# Patient Record
Sex: Female | Born: 1975 | Race: White | Hispanic: No | Marital: Married | Smoking: Current every day smoker
Health system: Southern US, Community
[De-identification: ages and names within clinical notes are randomized; demographics above are authoritative.]

## PROBLEM LIST (undated history)

## (undated) DIAGNOSIS — F329 Major depressive disorder, single episode, unspecified: Secondary | ICD-10-CM

## (undated) DIAGNOSIS — K219 Gastro-esophageal reflux disease without esophagitis: Secondary | ICD-10-CM

## (undated) DIAGNOSIS — I10 Essential (primary) hypertension: Secondary | ICD-10-CM

## (undated) DIAGNOSIS — J45909 Unspecified asthma, uncomplicated: Secondary | ICD-10-CM

## (undated) DIAGNOSIS — E119 Type 2 diabetes mellitus without complications: Secondary | ICD-10-CM

## (undated) DIAGNOSIS — Z973 Presence of spectacles and contact lenses: Secondary | ICD-10-CM

## (undated) DIAGNOSIS — R569 Unspecified convulsions: Secondary | ICD-10-CM

## (undated) DIAGNOSIS — J449 Chronic obstructive pulmonary disease, unspecified: Secondary | ICD-10-CM

## (undated) DIAGNOSIS — K3184 Gastroparesis: Secondary | ICD-10-CM

## (undated) DIAGNOSIS — G4700 Insomnia, unspecified: Secondary | ICD-10-CM

## (undated) DIAGNOSIS — I82409 Acute embolism and thrombosis of unspecified deep veins of unspecified lower extremity: Secondary | ICD-10-CM

## (undated) DIAGNOSIS — F32A Depression, unspecified: Secondary | ICD-10-CM

## (undated) DIAGNOSIS — K8020 Calculus of gallbladder without cholecystitis without obstruction: Secondary | ICD-10-CM

## (undated) DIAGNOSIS — E11649 Type 2 diabetes mellitus with hypoglycemia without coma: Secondary | ICD-10-CM

## (undated) DIAGNOSIS — F4010 Social phobia, unspecified: Secondary | ICD-10-CM

## (undated) DIAGNOSIS — T148XXA Other injury of unspecified body region, initial encounter: Secondary | ICD-10-CM

## (undated) DIAGNOSIS — M7120 Synovial cyst of popliteal space [Baker], unspecified knee: Secondary | ICD-10-CM

## (undated) DIAGNOSIS — G4730 Sleep apnea, unspecified: Secondary | ICD-10-CM

## (undated) DIAGNOSIS — F419 Anxiety disorder, unspecified: Secondary | ICD-10-CM

## (undated) DIAGNOSIS — R45851 Suicidal ideations: Secondary | ICD-10-CM

## (undated) DIAGNOSIS — C801 Malignant (primary) neoplasm, unspecified: Secondary | ICD-10-CM

## (undated) HISTORY — DX: Major depressive disorder, single episode, unspecified: F32.9

## (undated) HISTORY — DX: Suicidal ideations: R45.851

## (undated) HISTORY — DX: Synovial cyst of popliteal space (Baker), unspecified knee: M71.20

## (undated) HISTORY — DX: Acute embolism and thrombosis of unspecified deep veins of unspecified lower extremity: I82.409

## (undated) HISTORY — PX: ABDOMINAL HYSTERECTOMY: SHX81

## (undated) HISTORY — DX: Depression, unspecified: F32.A

## (undated) HISTORY — PX: DILATION AND CURETTAGE OF UTERUS: SHX78

## (undated) HISTORY — PX: CORONARY ARTERY ANGIOPLASTY: PR CATH30428

## (undated) HISTORY — PX: PB COLONOSCOPY,DIAGNOSTIC: 45378

## (undated) HISTORY — PX: HX HERNIA REPAIR: SHX51

## (undated) HISTORY — DX: Essential (primary) hypertension: I10

## (undated) HISTORY — DX: Type 2 diabetes mellitus without complications: E11.9

---
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
## 1998-01-26 ENCOUNTER — Ambulatory Visit (INDEPENDENT_AMBULATORY_CARE_PROVIDER_SITE_OTHER): Payer: Self-pay

## 1998-02-13 ENCOUNTER — Ambulatory Visit (INDEPENDENT_AMBULATORY_CARE_PROVIDER_SITE_OTHER): Payer: Self-pay

## 1998-02-19 ENCOUNTER — Ambulatory Visit (INDEPENDENT_AMBULATORY_CARE_PROVIDER_SITE_OTHER): Payer: Self-pay | Admitting: GASTROENTEROLOGY

## 1998-02-23 ENCOUNTER — Ambulatory Visit (INDEPENDENT_AMBULATORY_CARE_PROVIDER_SITE_OTHER): Payer: Self-pay

## 1998-03-19 ENCOUNTER — Ambulatory Visit (INDEPENDENT_AMBULATORY_CARE_PROVIDER_SITE_OTHER): Payer: Self-pay

## 1998-03-25 ENCOUNTER — Ambulatory Visit (HOSPITAL_COMMUNITY): Payer: Self-pay | Admitting: GASTROENTEROLOGY

## 1998-04-03 ENCOUNTER — Ambulatory Visit (INDEPENDENT_AMBULATORY_CARE_PROVIDER_SITE_OTHER): Payer: Self-pay

## 1998-04-06 ENCOUNTER — Ambulatory Visit (INDEPENDENT_AMBULATORY_CARE_PROVIDER_SITE_OTHER): Payer: Self-pay

## 1998-05-07 ENCOUNTER — Ambulatory Visit (INDEPENDENT_AMBULATORY_CARE_PROVIDER_SITE_OTHER): Payer: Self-pay

## 2003-08-22 ENCOUNTER — Inpatient Hospital Stay (HOSPITAL_COMMUNITY): Payer: Self-pay | Admitting: Psychiatry

## 2003-08-22 DIAGNOSIS — E119 Type 2 diabetes mellitus without complications: Secondary | ICD-10-CM | POA: Insufficient documentation

## 2003-08-22 DIAGNOSIS — E1142 Type 2 diabetes mellitus with diabetic polyneuropathy: Secondary | ICD-10-CM | POA: Insufficient documentation

## 2003-09-30 ENCOUNTER — Ambulatory Visit (HOSPITAL_COMMUNITY): Payer: Self-pay | Admitting: Psychiatry

## 2004-09-22 ENCOUNTER — Ambulatory Visit (HOSPITAL_COMMUNITY): Payer: Self-pay

## 2004-11-28 ENCOUNTER — Emergency Department (HOSPITAL_COMMUNITY): Payer: Self-pay | Admitting: Emergency Medical Services

## 2005-08-15 ENCOUNTER — Emergency Department (HOSPITAL_COMMUNITY): Payer: Self-pay | Admitting: Emergency Medicine

## 2005-09-30 ENCOUNTER — Ambulatory Visit (INDEPENDENT_AMBULATORY_CARE_PROVIDER_SITE_OTHER): Payer: Self-pay

## 2006-04-20 ENCOUNTER — Emergency Department (EMERGENCY_DEPARTMENT_HOSPITAL)
Admission: EM | Admit: 2006-04-20 | Discharge: 2006-04-20 | Disposition: A | Discharge status: Home / Self Care | Payer: MEDICAID

## 2006-04-22 ENCOUNTER — Emergency Department (EMERGENCY_DEPARTMENT_HOSPITAL): Admission: EM | Admit: 2006-04-22 | Discharge: 2006-04-22 | Payer: MEDICAID

## 2006-06-26 ENCOUNTER — Ambulatory Visit (INDEPENDENT_AMBULATORY_CARE_PROVIDER_SITE_OTHER): Payer: Self-pay

## 2006-06-28 ENCOUNTER — Emergency Department (EMERGENCY_DEPARTMENT_HOSPITAL)
Admission: EM | Admit: 2006-06-28 | Discharge: 2006-06-28 | Disposition: A | Discharge status: Home / Self Care | Payer: MEDICAID

## 2006-08-15 ENCOUNTER — Emergency Department (EMERGENCY_DEPARTMENT_HOSPITAL): Admission: EM | Admit: 2006-08-15 | Discharge: 2006-08-15 | Payer: MEDICAID

## 2006-09-04 ENCOUNTER — Emergency Department (EMERGENCY_DEPARTMENT_HOSPITAL): Admission: EM | Admit: 2006-09-04 | Discharge: 2006-09-04 | Payer: MEDICAID

## 2010-01-24 HISTORY — PX: HX HYSTERECTOMY: SHX81

## 2011-08-21 ENCOUNTER — Encounter (HOSPITAL_COMMUNITY): Payer: Self-pay

## 2011-08-21 ENCOUNTER — Emergency Department (HOSPITAL_COMMUNITY)
Admission: EM | Admit: 2011-08-21 | Discharge: 2011-08-22 | Disposition: A | Discharge status: Home / Self Care | Payer: Medicaid Other | Attending: Emergency Medicine | Admitting: Emergency Medicine

## 2011-08-21 DIAGNOSIS — J4489 Other specified chronic obstructive pulmonary disease: Secondary | ICD-10-CM | POA: Insufficient documentation

## 2011-08-21 DIAGNOSIS — J449 Chronic obstructive pulmonary disease, unspecified: Secondary | ICD-10-CM | POA: Insufficient documentation

## 2011-08-21 DIAGNOSIS — E119 Type 2 diabetes mellitus without complications: Secondary | ICD-10-CM | POA: Insufficient documentation

## 2011-08-21 DIAGNOSIS — F17200 Nicotine dependence, unspecified, uncomplicated: Secondary | ICD-10-CM | POA: Insufficient documentation

## 2011-08-21 DIAGNOSIS — R600 Localized edema: Secondary | ICD-10-CM

## 2011-08-21 DIAGNOSIS — R609 Edema, unspecified: Secondary | ICD-10-CM | POA: Insufficient documentation

## 2011-08-21 HISTORY — DX: Type 2 diabetes mellitus with hypoglycemia without coma: E11.649

## 2011-08-21 HISTORY — DX: Other injury of unspecified body region, initial encounter: T14.8XXA

## 2011-08-21 HISTORY — DX: Sleep apnea, unspecified: G47.30

## 2011-08-21 HISTORY — DX: Social phobia, unspecified: F40.10

## 2011-08-21 HISTORY — DX: Calculus of gallbladder without cholecystitis without obstruction: K80.20

## 2011-08-21 HISTORY — DX: Chronic obstructive pulmonary disease, unspecified: J44.9

## 2011-08-21 HISTORY — DX: Insomnia, unspecified: G47.00

## 2011-08-21 HISTORY — DX: Unspecified asthma, uncomplicated: J45.909

## 2011-08-21 MED ORDER — FUROSEMIDE 40 MG PO TABS
20.0000 mg | ORAL_TABLET | Freq: Once | ORAL | Status: AC
  Administered 2011-08-22: 20 mg via ORAL
  Filled 2011-08-21: qty 1

## 2011-08-21 NOTE — ED Notes (Signed)
Pt reports lower est swelling x1 day. Hurts to walk. Some edema noted, good cap refill.

## 2011-08-21 NOTE — ED Notes (Signed)
Feet and legs are swelling, having pain up into my knees per pt.

## 2011-08-22 MED ORDER — FUROSEMIDE 20 MG PO TABS: 20.0000 mg | ORAL_TABLET | Freq: Every day | ORAL | Status: DC

## 2011-08-22 MED ORDER — POTASSIUM CHLORIDE CRYS ER 20 MEQ PO TBCR: 20.0000 meq | EXTENDED_RELEASE_TABLET | Freq: Every day | ORAL | Status: DC

## 2011-08-22 NOTE — ED Provider Notes (Cosign Needed)
History     CSN: [id number]  Arrival date & time 08/21/11  2320   First MD Initiated Contact with Patient 08/21/11 2339      Chief Complaint  Patient presents with  . Foot Swelling    (Consider location/radiation/quality/duration/timing/severity/associated sxs/prior treatment) HPI  Judith Rice is a 36 y.o. female who presents to the Emergency Department complaining of swelling to both feet and lower legs that began today. Uncomfortable due to swelling. NO changes in diet or medicines. In the past she has been on Lasix 20 mg. Denies fever, chills, shortness of breath, chest pain.   No PCP Past Medical History  Diagnosis Date  . Nerve damage   . Sleep apnea   . COPD (chronic obstructive pulmonary disease)   . Asthma   . Hypoglycemia associated with diabetes   . Insomnia   . Gallstones   . Social anxiety disorder     Past Surgical History  Procedure Date  . Abdominal hysterectomy   . Dilation and curettage of uterus     History reviewed. No pertinent family history.  History  Substance Use Topics  . Smoking status: Current Everyday Smoker -- 1.0 packs/day  . Smokeless tobacco: Not on file  . Alcohol Use: No    OB History    Grav Para Term Preterm Abortions TAB SAB Ect Mult Living                  Review of Systems  Musculoskeletal:       Swelling to both feet and lower legs    Allergies  Clindamycin/lincomycin and Slo-bid gyrocaps  Home Medications   Current Outpatient Rx  Name Route Sig Dispense Refill  . ALBUTEROL SULFATE HFA 108 (90 BASE) MCG/ACT IN AERS Inhalation Inhale 2 puffs into the lungs every 6 (six) hours as needed.    . ALPRAZOLAM 0.5 MG PO TABS Oral Take 0.5 mg by mouth 3 (three) times daily.    . ARIPIPRAZOLE 20 MG PO TABS Oral Take 20 mg by mouth 2 (two) times daily.    . ASPIRIN 81 MG PO TABS Oral Take 81 mg by mouth daily.    Marland Kitchen CITALOPRAM HYDROBROMIDE 20 MG PO TABS Oral Take 20 mg by mouth at bedtime.    Marland Kitchen CLONAZEPAM 0.5 MG PO TABS  Oral Take 0.5 mg by mouth 2 (two) times daily as needed.    . CYCLOBENZAPRINE HCL 10 MG PO TABS Oral Take 10 mg by mouth daily.    Marland Kitchen FLUTICASONE PROPIONATE 50 MCG/ACT NA SUSP Nasal Place 1 spray into the nose daily.    Marland Kitchen FLUTICASONE-SALMETEROL 250-50 MCG/DOSE IN AEPB Inhalation Inhale 1 puff into the lungs every 12 (twelve) hours.    Marland Kitchen GABAPENTIN 300 MG PO CAPS Oral Take 300 mg by mouth 3 (three) times daily.    Marland Kitchen METFORMIN HCL 500 MG PO TABS Oral Take 500 mg by mouth daily with breakfast.    . MODAFINIL 200 MG PO TABS Oral Take 200 mg by mouth 2 (two) times daily.    Marland Kitchen MONTELUKAST SODIUM 10 MG PO TABS Oral Take 10 mg by mouth at bedtime.    . OMEPRAZOLE 20 MG PO CPDR Oral Take 20 mg by mouth daily.    Marland Kitchen ZOLPIDEM TARTRATE 10 MG PO TABS Oral Take 10 mg by mouth at bedtime as needed.      BP 107/59  Pulse 75  Temp 98.5 F (36.9 C) (Oral)  Resp 20  Ht 5\' 5"  (1.651 m)  Wt 300 lb (136.079 kg)  BMI 49.92 kg/m2  SpO2 96%  Physical Exam  Nursing note and vitals reviewed. Constitutional: She is oriented to person, place, and time.       Awake, alert, nontoxic appearance.  HENT:  Head: Atraumatic.  Eyes: Right eye exhibits no discharge. Left eye exhibits no discharge.  Neck: Neck supple.  Pulmonary/Chest: Effort normal. She has wheezes. She exhibits no tenderness.       Occasional end expiratory wheeze  Abdominal: Soft. There is no tenderness. There is no rebound.  Musculoskeletal: She exhibits edema. She exhibits no tenderness.       Baseline ROM, no obvious new focal weakness. 2+ non pitting edema to top of calf bilaterally  Neurological: She is alert and oriented to person, place, and time. She has normal reflexes.       Mental status and motor strength appears baseline for patient and situation.  Skin: Skin is warm and dry. No rash noted.  Psychiatric: She has a normal mood and affect.    ED Course  Procedures (including critical care time)      MDM  Mobidly obese woman  here with increased swelling to feet and lower legs. Will begin Lasix for one week. Firs dose given while here. Pt stable in ED with no significant deterioration in condition.The patient appears reasonably screened and/or stabilized for discharge and I doubt any other medical condition or other [facility] requiring further screening, evaluation, or treatment in the ED at this time prior to discharge.  MDM Reviewed: nursing note and vitals           Nicoletta Dress. Colon Branch, MD 08/22/11 7829

## 2011-08-22 NOTE — ED Notes (Signed)
Pt alert & oriented x4, stable gait. Patient given discharge instructions, paperwork & prescription(s). Patient  instructed to stop at the registration desk to finish any additional paperwork. Patient verbalized understanding. Pt left department w/ no further questions. 

## 2011-09-08 ENCOUNTER — Emergency Department (HOSPITAL_COMMUNITY)
Admission: EM | Admit: 2011-09-08 | Discharge: 2011-09-09 | Disposition: A | Discharge status: Home / Self Care | Payer: Medicaid Other | Attending: Emergency Medicine | Admitting: Emergency Medicine

## 2011-09-08 ENCOUNTER — Encounter (HOSPITAL_COMMUNITY): Payer: Self-pay | Admitting: *Deleted

## 2011-09-08 DIAGNOSIS — J449 Chronic obstructive pulmonary disease, unspecified: Secondary | ICD-10-CM | POA: Insufficient documentation

## 2011-09-08 DIAGNOSIS — Z91038 Other insect allergy status: Secondary | ICD-10-CM | POA: Insufficient documentation

## 2011-09-08 DIAGNOSIS — K8020 Calculus of gallbladder without cholecystitis without obstruction: Secondary | ICD-10-CM | POA: Insufficient documentation

## 2011-09-08 DIAGNOSIS — R109 Unspecified abdominal pain: Secondary | ICD-10-CM | POA: Insufficient documentation

## 2011-09-08 DIAGNOSIS — G4730 Sleep apnea, unspecified: Secondary | ICD-10-CM | POA: Insufficient documentation

## 2011-09-08 DIAGNOSIS — E119 Type 2 diabetes mellitus without complications: Secondary | ICD-10-CM | POA: Insufficient documentation

## 2011-09-08 DIAGNOSIS — Z881 Allergy status to other antibiotic agents status: Secondary | ICD-10-CM | POA: Insufficient documentation

## 2011-09-08 DIAGNOSIS — F411 Generalized anxiety disorder: Secondary | ICD-10-CM | POA: Insufficient documentation

## 2011-09-08 DIAGNOSIS — F17200 Nicotine dependence, unspecified, uncomplicated: Secondary | ICD-10-CM | POA: Insufficient documentation

## 2011-09-08 DIAGNOSIS — J4489 Other specified chronic obstructive pulmonary disease: Secondary | ICD-10-CM | POA: Insufficient documentation

## 2011-09-08 DIAGNOSIS — Z888 Allergy status to other drugs, medicaments and biological substances status: Secondary | ICD-10-CM | POA: Insufficient documentation

## 2011-09-08 LAB — URINALYSIS, ROUTINE W REFLEX MICROSCOPIC
Leukocytes, UA: NEGATIVE
Nitrite: POSITIVE — AB
Protein, ur: NEGATIVE mg/dL
Specific Gravity, Urine: 1.025 (ref 1.005–1.030)
Urobilinogen, UA: 0.2 mg/dL (ref 0.0–1.0)

## 2011-09-08 LAB — URINE MICROSCOPIC-ADD ON

## 2011-09-08 MED ORDER — HYDROMORPHONE HCL PF 1 MG/ML IJ SOLN
1.0000 mg | Freq: Once | INTRAMUSCULAR | Status: AC
  Administered 2011-09-08: 1 mg via INTRAVENOUS
  Filled 2011-09-08: qty 1

## 2011-09-08 MED ORDER — SODIUM CHLORIDE 0.9 % IV SOLN
Freq: Once | INTRAVENOUS | Status: AC
  Administered 2011-09-08: via INTRAVENOUS

## 2011-09-08 MED ORDER — PANTOPRAZOLE SODIUM 40 MG IV SOLR
40.0000 mg | Freq: Once | INTRAVENOUS | Status: AC
  Administered 2011-09-08: 40 mg via INTRAVENOUS
  Filled 2011-09-08: qty 40

## 2011-09-08 MED ORDER — ONDANSETRON HCL 4 MG/2ML IJ SOLN
4.0000 mg | Freq: Once | INTRAMUSCULAR | Status: AC
  Administered 2011-09-08: 4 mg via INTRAVENOUS
  Filled 2011-09-08: qty 2

## 2011-09-08 NOTE — ED Notes (Signed)
RUQ pain, N/V

## 2011-09-08 NOTE — ED Notes (Signed)
Gave patient drink at patient request and MD approval.

## 2011-09-08 NOTE — ED Provider Notes (Signed)
History  This chart was scribed for [employer]. Colon Branch, MD by Bennett Scrape. This patient was seen in room APA12/APA12 and the patient's care was started at 10:47PM.  CSN: [id number]  Arrival date & time 09/08/11  1939   First MD Initiated Contact with Patient 09/08/11 2247      Chief Complaint  Patient presents with  . Abdominal Pain    The history is provided by the patient. No language interpreter was used.    Judith Rice is a 36 y.o. female with a h/o gallstones who presents to the Emergency Department complaining of approximately 13 hours of gradual onset, gradually worsening, constant non-radiating RUQ abdominal pain with associated nausea and non-bloody emesis that she attributes to gallstones. She reports prior episodes of same symptoms attributed to gallstones, the last episode occuring one month ago. She states that she had a CT scan that showed gallstones during one of her last ED visits. She denies having pain medications or antiemetics at home. She reports that she has an [facility] on [facility]. She denies fever, neck pain, sore throat, visual disturbance, CP, cough, SOB, diarrhea, urinary symptoms, back pain, HA, weakness, numbness and rash as associated symptoms. She has a h/o asthma, COPD, anxiety and DM but she denies being on insulin. She is a current everyday smoker but denies alcohol use.  Past Medical History  Diagnosis Date  . Nerve damage   . Sleep apnea   . COPD (chronic obstructive pulmonary disease)   . Asthma   . Hypoglycemia associated with diabetes   . Insomnia   . Gallstones   . Social anxiety disorder     Past Surgical History  Procedure Date  . Abdominal hysterectomy   . Dilation and curettage of uterus     History reviewed. No pertinent family history.  History  Substance Use Topics  . Smoking status: Current Everyday Smoker -- 1.0 packs/day  . Smokeless tobacco: Not on file  . Alcohol Use: No    No OB history  provided.  Review of Systems  Constitutional: Negative for fever.       10 Systems reviewed and are negative for acute change except as noted in the HPI.  HENT: Negative for congestion.   Eyes: Negative for discharge and redness.  Respiratory: Negative for cough and shortness of breath.   Cardiovascular: Negative for chest pain.  Gastrointestinal: Negative for vomiting and abdominal pain.  Musculoskeletal: Negative for back pain.  Skin: Negative for rash.  Neurological: Negative for syncope, numbness and headaches.  Psychiatric/Behavioral:       No behavior change.    A complete 10 system review of systems was obtained and all systems are negative except as noted in the HPI and PMH.   Allergies  Bee venom; Clindamycin/lincomycin; and Slo-bid gyrocaps  Home Medications   Current Outpatient Rx  Name Route Sig Dispense Refill  . ALBUTEROL SULFATE HFA 108 (90 BASE) MCG/ACT IN AERS Inhalation Inhale 2 puffs into the lungs every 6 (six) hours as needed.    . ALPRAZOLAM 0.5 MG PO TABS Oral Take 0.5 mg by mouth 3 (three) times daily.    . ARIPIPRAZOLE 20 MG PO TABS Oral Take 20 mg by mouth 2 (two) times daily.    . ASPIRIN 81 MG PO TABS Oral Take 81 mg by mouth daily.    Marland Kitchen CITALOPRAM HYDROBROMIDE 20 MG PO TABS Oral Take 20 mg by mouth at bedtime.    Marland Kitchen CLONAZEPAM 0.5 MG PO  TABS Oral Take 0.5 mg by mouth 2 (two) times daily as needed.    . CYCLOBENZAPRINE HCL 10 MG PO TABS Oral Take 10 mg by mouth daily.    Marland Kitchen FLUTICASONE PROPIONATE 50 MCG/ACT NA SUSP Nasal Place 1 spray into the nose at bedtime.     Marland Kitchen FLUTICASONE-SALMETEROL 250-50 MCG/DOSE IN AEPB Inhalation Inhale 1 puff into the lungs every 12 (twelve) hours.    Marland Kitchen GABAPENTIN 300 MG PO CAPS Oral Take 300 mg by mouth 3 (three) times daily.    . IPRATROPIUM-ALBUTEROL 0.5-2.5 (3) MG/3ML IN SOLN Nebulization Take 3 mLs by nebulization every 6 (six) hours as needed.    Marland Kitchen METFORMIN HCL 500 MG PO TABS Oral Take 500 mg by mouth 2 (two) times  daily.     Marland Kitchen MODAFINIL 200 MG PO TABS Oral Take 200 mg by mouth 2 (two) times daily.    Marland Kitchen MONTELUKAST SODIUM 10 MG PO TABS Oral Take 10 mg by mouth at bedtime.    . OMEPRAZOLE 20 MG PO CPDR Oral Take 20 mg by mouth daily.    Marland Kitchen ZOLPIDEM TARTRATE 10 MG PO TABS Oral Take 10 mg by mouth at bedtime as needed.      Triage Vitals: BP 118/65  Pulse 76  Resp 20  Ht 5\' 5"  (1.651 m)  Wt 330 lb 4.8 oz (149.823 kg)  BMI 54.96 kg/m2  SpO2 95%  Physical Exam  Nursing note and vitals reviewed. Constitutional: She is oriented to person, place, and time. She appears well-developed and well-nourished. No distress.  HENT:  Head: Normocephalic and atraumatic.  Eyes: Conjunctivae and EOM are normal.  Neck: Neck supple. No tracheal deviation present.  Cardiovascular: Normal rate and regular rhythm.  Exam reveals no gallop and no friction rub.   No murmur heard. Pulmonary/Chest: Effort normal and breath sounds normal. No respiratory distress. She has no wheezes. She has no rales.  Abdominal: There is tenderness (mild RUQ tenderness). There is guarding (mild).  Musculoskeletal: Normal range of motion. She exhibits edema.  Neurological: She is alert and oriented to person, place, and time.  Skin: Skin is warm and dry.  Psychiatric: She has a normal mood and affect. Her behavior is normal.    ED Course  Procedures (including critical care time)  DIAGNOSTIC STUDIES: Oxygen Saturation is 95% on room air, normal by my interpretation.    COORDINATION OF CARE: 10:54PM-Discussed treatment plan which includes a referral to surgeon with pt at bedside and pt agreed to plan. Results for orders placed during the hospital encounter of 09/08/11  URINALYSIS, ROUTINE W REFLEX MICROSCOPIC      Component Value Range   Color, Urine AMBER (*) YELLOW   APPearance CLEAR  CLEAR   Specific Gravity, Urine 1.025  1.005 - 1.030   pH 6.0  5.0 - 8.0   Glucose, UA NEGATIVE  NEGATIVE mg/dL   Hgb urine dipstick NEGATIVE   NEGATIVE   Bilirubin Urine NEGATIVE  NEGATIVE   Ketones, ur NEGATIVE  NEGATIVE mg/dL   Protein, ur NEGATIVE  NEGATIVE mg/dL   Urobilinogen, UA 0.2  0.0 - 1.0 mg/dL   Nitrite POSITIVE (*) NEGATIVE   Leukocytes, UA NEGATIVE  NEGATIVE  URINE MICROSCOPIC-ADD ON      Component Value Range   Squamous Epithelial / LPF MANY (*) RARE   WBC, UA 0-2  <3 WBC/hpf   RBC / HPF 0-2  <3 RBC/hpf   Bacteria, UA MANY (*) RARE  MDM  Patient with known gallstones here with RUQ pains similar to previous gall bladder pain.Given IVF, anageslic, antiemetic with relief. Pt feels improved after observation and/or treatment in ED.Pt stable in ED with no significant deterioration in condition.The patient appears reasonably screened and/or stabilized for discharge and I doubt any other medical condition or other [facility] requiring further screening, evaluation, or treatment in the ED at this time prior to discharge.  I personally performed the services described in this documentation, which was scribed in my presence. The recorded information has been reviewed and considered.   MDM Reviewed: nursing note, previous chart and vitals Interpretation: labs           Nicoletta Dress. Colon Branch, MD 09/09/11 [phone]

## 2011-09-09 MED ORDER — HYDROCODONE-ACETAMINOPHEN 5-325 MG PO TABS: 1.0000 | ORAL_TABLET | ORAL | Status: AC | PRN

## 2011-09-09 MED ORDER — ONDANSETRON HCL 4 MG PO TABS: 4.0000 mg | ORAL_TABLET | Freq: Four times a day (QID) | ORAL | Status: DC

## 2011-09-12 ENCOUNTER — Observation Stay (HOSPITAL_COMMUNITY)
Admission: EM | Admit: 2011-09-12 | Discharge: 2011-09-12 | Disposition: A | Discharge status: Home / Self Care | Payer: Medicaid Other | Attending: Emergency Medicine | Admitting: Emergency Medicine

## 2011-09-12 ENCOUNTER — Encounter (HOSPITAL_COMMUNITY): Payer: Self-pay | Admitting: *Deleted

## 2011-09-12 ENCOUNTER — Observation Stay (HOSPITAL_COMMUNITY): Payer: Medicaid Other

## 2011-09-12 DIAGNOSIS — R112 Nausea with vomiting, unspecified: Secondary | ICD-10-CM | POA: Insufficient documentation

## 2011-09-12 DIAGNOSIS — R1011 Right upper quadrant pain: Principal | ICD-10-CM

## 2011-09-12 DIAGNOSIS — J4489 Other specified chronic obstructive pulmonary disease: Secondary | ICD-10-CM | POA: Insufficient documentation

## 2011-09-12 DIAGNOSIS — J449 Chronic obstructive pulmonary disease, unspecified: Secondary | ICD-10-CM | POA: Insufficient documentation

## 2011-09-12 DIAGNOSIS — E119 Type 2 diabetes mellitus without complications: Secondary | ICD-10-CM | POA: Insufficient documentation

## 2011-09-12 LAB — RAPID URINE DRUG SCREEN, HOSP PERFORMED: Opiates: NOT DETECTED

## 2011-09-12 LAB — COMPREHENSIVE METABOLIC PANEL
ALT: 8 U/L (ref 0–35)
Alkaline Phosphatase: 81 U/L (ref 39–117)
BUN: 14 mg/dL (ref 6–23)
CO2: 24 mEq/L (ref 19–32)
Calcium: 9.6 mg/dL (ref 8.4–10.5)
GFR calc Af Amer: 88 mL/min — ABNORMAL LOW (ref >90)
GFR calc non Af Amer: 76 mL/min — ABNORMAL LOW (ref >90)
Glucose, Bld: 97 mg/dL (ref 70–99)
Potassium: 3.9 mEq/L (ref 3.5–5.1)
Sodium: 136 mEq/L (ref 135–145)
Total Protein: 7.7 g/dL (ref 6.0–8.3)

## 2011-09-12 LAB — URINALYSIS, ROUTINE W REFLEX MICROSCOPIC
Glucose, UA: NEGATIVE mg/dL
Hgb urine dipstick: NEGATIVE
Ketones, ur: NEGATIVE mg/dL
Leukocytes, UA: NEGATIVE
Protein, ur: NEGATIVE mg/dL
pH: 5.5 (ref 5.0–8.0)

## 2011-09-12 LAB — CBC
HCT: 46.3 % — ABNORMAL HIGH (ref 36.0–46.0)
Hemoglobin: 15.6 g/dL — ABNORMAL HIGH (ref 12.0–15.0)
MCH: 31.4 pg (ref 26.0–34.0)
MCHC: 33.7 g/dL (ref 30.0–36.0)
RBC: 4.97 MIL/uL (ref 3.87–5.11)

## 2011-09-12 LAB — LIPASE, BLOOD: Lipase: 21 U/L (ref 11–59)

## 2011-09-12 MED ORDER — ONDANSETRON HCL 4 MG/2ML IJ SOLN
4.0000 mg | Freq: Once | INTRAMUSCULAR | Status: AC
  Administered 2011-09-12: 4 mg via INTRAVENOUS
  Filled 2011-09-12: qty 2

## 2011-09-12 MED ORDER — SODIUM CHLORIDE 0.9 % IV SOLN
INTRAVENOUS | Status: DC
  Administered 2011-09-12: 05:00:00 via INTRAVENOUS

## 2011-09-12 MED ORDER — HYDROMORPHONE HCL PF 1 MG/ML IJ SOLN
1.0000 mg | Freq: Once | INTRAMUSCULAR | Status: AC
  Administered 2011-09-12: 1 mg via INTRAVENOUS
  Filled 2011-09-12: qty 1

## 2011-09-12 MED ORDER — PROMETHAZINE HCL 25 MG/ML IJ SOLN
25.0000 mg | Freq: Once | INTRAMUSCULAR | Status: AC
  Administered 2011-09-12: 25 mg via INTRAMUSCULAR
  Filled 2011-09-12: qty 1

## 2011-09-12 MED ORDER — ONDANSETRON HCL 4 MG/2ML IJ SOLN: 4.0000 mg | Freq: Four times a day (QID) | INTRAMUSCULAR | Status: DC | PRN

## 2011-09-12 MED ORDER — SODIUM CHLORIDE 0.9 % IV SOLN: INTRAVENOUS | Status: DC

## 2011-09-12 MED ORDER — HYDROMORPHONE HCL PF 1 MG/ML IJ SOLN: 1.0000 mg | INTRAMUSCULAR | Status: DC | PRN

## 2011-09-12 NOTE — ED Provider Notes (Signed)
History     CSN: [id number]  Arrival date & time 09/12/11  0500   First MD Initiated Contact with Patient 09/12/11 [phone]      Chief Complaint  Patient presents with  . Abdominal Pain    (Consider location/radiation/quality/duration/timing/severity/associated sxs/prior treatment) HPI Hx per PT. RUQ pain and N/Vstarted this am around 3.  Ate dinner last night 8pm - denies fatty foods. Has h/o gallstones diagnosed about 3 months ago in [place], She was doing well until 09-08-11 when she was seen here for RUQ pain with vomiting, had symptoms controlled in the ED and was sent home. She is scheduled to see [facility] Dr Suzette Battiest Tuesday.  She is having symptoms today despite hydrocodone and zofran at home. Pain sharp in quality and mod in severity Past Medical History  Diagnosis Date  . Nerve damage   . Sleep apnea   . COPD (chronic obstructive pulmonary disease)   . Asthma   . Hypoglycemia associated with diabetes   . Insomnia   . Gallstones   . Social anxiety disorder     Past Surgical History  Procedure Date  . Abdominal hysterectomy   . Dilation and curettage of uterus     History reviewed. No pertinent family history.  History  Substance Use Topics  . Smoking status: Current Everyday Smoker -- 1.0 packs/day  . Smokeless tobacco: Not on file  . Alcohol Use: No    OB History    Grav Para Term Preterm Abortions TAB SAB Ect Mult Living                  Review of Systems  Constitutional: Negative for fever and chills.  HENT: Negative for neck pain and neck stiffness.   Eyes: Negative for pain.  Respiratory: Negative for shortness of breath.   Cardiovascular: Negative for chest pain.  Gastrointestinal: Positive for nausea, vomiting and abdominal pain.  Genitourinary: Negative for dysuria.  Musculoskeletal: Negative for back pain.  Skin: Negative for rash.  Neurological: Negative for headaches.  All other systems reviewed and are negative.    Allergies  Bee venom;  Clindamycin/lincomycin; and Slo-bid gyrocaps  Home Medications   Current Outpatient Rx  Name Route Sig Dispense Refill  . ALBUTEROL SULFATE HFA 108 (90 BASE) MCG/ACT IN AERS Inhalation Inhale 2 puffs into the lungs every 6 (six) hours as needed.    . ALPRAZOLAM 0.5 MG PO TABS Oral Take 0.5 mg by mouth 3 (three) times daily.    . ARIPIPRAZOLE 20 MG PO TABS Oral Take 20 mg by mouth 2 (two) times daily.    . ASPIRIN 81 MG PO TABS Oral Take 81 mg by mouth daily.    Marland Kitchen CITALOPRAM HYDROBROMIDE 20 MG PO TABS Oral Take 20 mg by mouth at bedtime.    Marland Kitchen CLONAZEPAM 0.5 MG PO TABS Oral Take 0.5 mg by mouth 2 (two) times daily as needed.    . CYCLOBENZAPRINE HCL 10 MG PO TABS Oral Take 10 mg by mouth daily.    Marland Kitchen FLUTICASONE PROPIONATE 50 MCG/ACT NA SUSP Nasal Place 1 spray into the nose at bedtime.     Marland Kitchen FLUTICASONE-SALMETEROL 250-50 MCG/DOSE IN AEPB Inhalation Inhale 1 puff into the lungs every 12 (twelve) hours.    Marland Kitchen GABAPENTIN 300 MG PO CAPS Oral Take 300 mg by mouth 3 (three) times daily.    Marland Kitchen HYDROCODONE-ACETAMINOPHEN 5-325 MG PO TABS Oral Take 1 tablet by mouth every 4 (four) hours as needed for pain. 15 tablet 0  .  IPRATROPIUM-ALBUTEROL 0.5-2.5 (3) MG/3ML IN SOLN Nebulization Take 3 mLs by nebulization every 6 (six) hours as needed.    Marland Kitchen METFORMIN HCL 500 MG PO TABS Oral Take 500 mg by mouth 2 (two) times daily.     Marland Kitchen MODAFINIL 200 MG PO TABS Oral Take 200 mg by mouth 2 (two) times daily.    Marland Kitchen MONTELUKAST SODIUM 10 MG PO TABS Oral Take 10 mg by mouth at bedtime.    . OMEPRAZOLE 20 MG PO CPDR Oral Take 20 mg by mouth daily.    Marland Kitchen ONDANSETRON HCL 4 MG PO TABS Oral Take 1 tablet (4 mg total) by mouth every 6 (six) hours. 12 tablet 0  . ZOLPIDEM TARTRATE 10 MG PO TABS Oral Take 10 mg by mouth at bedtime as needed.      BP 107/68  Pulse 68  Temp 98.3 F (36.8 C) (Oral)  Resp 16  SpO2 95%  Physical Exam  Constitutional: She is oriented to person, place, and time. She appears well-developed and  well-nourished.  HENT:  Head: Normocephalic and atraumatic.  Eyes: Conjunctivae and EOM are normal. Pupils are equal, round, and reactive to light.  Neck: Trachea normal. Neck supple. No thyromegaly present.  Cardiovascular: Normal rate, regular rhythm, S1 normal, S2 normal and normal pulses.     No systolic murmur is present   No diastolic murmur is present  Pulses:      Radial pulses are 2+ on the right side, and 2+ on the left side.  Pulmonary/Chest: Effort normal and breath sounds normal. She has no wheezes. She has no rhonchi. She has no rales. She exhibits no tenderness.  Abdominal: Soft. Normal appearance and bowel sounds are normal. There is no CVA tenderness.       Obese, tender RUQ. Positive Murphys sign  Musculoskeletal:       BLE:s Calves nontender, no cords or erythema, negative Homans sign  Neurological: She is alert and oriented to person, place, and time. She has normal strength. No cranial nerve deficit or sensory deficit. GCS eye subscore is 4. GCS verbal subscore is 5. GCS motor subscore is 6.  Skin: Skin is warm and dry. No rash noted. She is not diaphoretic.  Psychiatric: Her speech is normal.       Cooperative and appropriate    ED Course  Procedures (including critical care time)  Results for orders placed during the hospital encounter of 09/12/11  URINALYSIS, ROUTINE W REFLEX MICROSCOPIC      Component Value Range   Color, Urine YELLOW  YELLOW   APPearance CLEAR  CLEAR   Specific Gravity, Urine 1.005  1.005 - 1.030   pH 5.5  5.0 - 8.0   Glucose, UA NEGATIVE  NEGATIVE mg/dL   Hgb urine dipstick NEGATIVE  NEGATIVE   Bilirubin Urine NEGATIVE  NEGATIVE   Ketones, ur NEGATIVE  NEGATIVE mg/dL   Protein, ur NEGATIVE  NEGATIVE mg/dL   Urobilinogen, UA 0.2  0.0 - 1.0 mg/dL   Nitrite NEGATIVE  NEGATIVE   Leukocytes, UA NEGATIVE  NEGATIVE  PREGNANCY, URINE      Component Value Range   Preg Test, Ur NEGATIVE  NEGATIVE  CBC      Component Value Range   WBC 11.3  (*) 4.0 - 10.5 K/uL   RBC 4.97  3.87 - 5.11 MIL/uL   Hemoglobin 15.6 (*) 12.0 - 15.0 g/dL   HCT 04.5 (*) 40.9 - 81.1 %   MCV 93.2  78.0 - 100.0 fL  MCH 31.4  26.0 - 34.0 pg   MCHC 33.7  30.0 - 36.0 g/dL   RDW 16.1  09.6 - 04.5 %   Platelets 238  150 - 400 K/uL  COMPREHENSIVE METABOLIC PANEL      Component Value Range   Sodium 136  135 - 145 mEq/L   Potassium 3.9  3.5 - 5.1 mEq/L   Chloride 102  96 - 112 mEq/L   CO2 24  19 - 32 mEq/L   Glucose, Bld 97  70 - 99 mg/dL   BUN 14  6 - 23 mg/dL   Creatinine, Ser 4.09  0.50 - 1.10 mg/dL   Calcium 9.6  8.4 - 81.1 mg/dL   Total Protein 7.7  6.0 - 8.3 g/dL   Albumin 3.6  3.5 - 5.2 g/dL   AST 10  0 - 37 U/L   ALT 8  0 - 35 U/L   Alkaline Phosphatase 81  39 - 117 U/L   Total Bilirubin 0.4  0.3 - 1.2 mg/dL   GFR calc non Af Amer 76 (*) >90 mL/min   GFR calc Af Amer 88 (*) >90 mL/min  LIPASE, BLOOD      Component Value Range   Lipase 21  11 - 59 U/L   IVFs. IV dilaudid. IV zofran  Requiring repeat Dilaudid for persistent pain.   Records from [place] requested.no [place] available overnight.   7:00 AM d/w Dr Leticia Penna, plan admit / pending [place]  MDM   Nursing notes, VS and previous records reviewed. IV narcotics. Labs, UA reviewed as above - mild leukocytosis, normal LFTs/ lipase.  PT care Tx IK pending [place] report and [place] here if unavailable.       Sunnie Nielsen, MD 09/12/11 2248

## 2011-09-12 NOTE — ED Notes (Signed)
Pt c/o continued ruq pain and n/v.

## 2011-09-12 NOTE — ED Provider Notes (Cosign Needed)
Records obtained from [facility] and her abdominal US was NEGATIVE for gallstones. Did have positive UDS.  [place] here also negative for gallstones.   US Abdomen Limited Ruq  09/12/2011  *RADIOLOGY REPORT*  Clinical Data:  RUQ pain  GALLBLADDER ULTRASOUND  Comparison:  None  Findings:  Gallbladder:  No gallstones, gallbladder wall thickening, or pericholecystic fluid.  Common Bile Duct:  Within normal limits in caliber.  Liver:  No focal lesion identified.  Appears slightly echogenic suggesting fatty infiltration.  IMPRESSION:  1.  No acute findings. 2.  Mild fatty infiltration of the liver.   Original Report Authenticated By: Rosealee Albee, M.D. ( 09/12/2011 08:13:54 )     08:37 Dr Leticia Penna notified of [place] results. He wants to see if she can get a HIDA scan in the morning (needs to be off dilaudid) and will see in the office afterwards.   Pt scheduled for HIDA scan at 8 am. Her appointment with Dr Leticia Penna is at 10 am.   Diagnoses that have been ruled out:  None  Diagnoses that are still under consideration:  None  Final diagnoses:  Abdominal pain, RUQ  RUQ pain    Plan discharge  Devoria Albe, MD, Franz Dell, MD 09/12/11 [phone]

## 2011-09-12 NOTE — ED Notes (Signed)
States she is having mild itching that feels like a "nervous itch."

## 2011-09-13 ENCOUNTER — Encounter (HOSPITAL_COMMUNITY): Payer: Self-pay

## 2011-09-13 ENCOUNTER — Encounter (HOSPITAL_COMMUNITY)
Admit: 2011-09-13 | Discharge: 2011-09-13 | Disposition: A | Discharge status: Home / Self Care | Payer: Medicaid Other | Source: Ambulatory Visit | Attending: Emergency Medicine | Admitting: Emergency Medicine

## 2011-09-13 DIAGNOSIS — R1011 Right upper quadrant pain: Secondary | ICD-10-CM

## 2011-09-13 HISTORY — DX: Malignant (primary) neoplasm, unspecified: C80.1

## 2011-09-13 MED ORDER — TECHNETIUM TC 99M MEBROFENIN IV KIT
5.0000 | PACK | Freq: Once | INTRAVENOUS | Status: AC | PRN
  Administered 2011-09-13: 5.2 via INTRAVENOUS

## 2011-10-16 ENCOUNTER — Emergency Department (HOSPITAL_COMMUNITY): Payer: Medicaid Other

## 2011-10-16 ENCOUNTER — Emergency Department (HOSPITAL_COMMUNITY)
Admission: EM | Admit: 2011-10-16 | Discharge: 2011-10-16 | Disposition: A | Discharge status: Home / Self Care | Payer: Medicaid Other | Attending: Emergency Medicine | Admitting: Emergency Medicine

## 2011-10-16 ENCOUNTER — Encounter (HOSPITAL_COMMUNITY): Payer: Self-pay

## 2011-10-16 DIAGNOSIS — R0602 Shortness of breath: Secondary | ICD-10-CM | POA: Insufficient documentation

## 2011-10-16 DIAGNOSIS — J209 Acute bronchitis, unspecified: Secondary | ICD-10-CM

## 2011-10-16 DIAGNOSIS — Z859 Personal history of malignant neoplasm, unspecified: Secondary | ICD-10-CM | POA: Insufficient documentation

## 2011-10-16 DIAGNOSIS — F17200 Nicotine dependence, unspecified, uncomplicated: Secondary | ICD-10-CM | POA: Insufficient documentation

## 2011-10-16 DIAGNOSIS — J449 Chronic obstructive pulmonary disease, unspecified: Secondary | ICD-10-CM | POA: Insufficient documentation

## 2011-10-16 DIAGNOSIS — G4700 Insomnia, unspecified: Secondary | ICD-10-CM | POA: Insufficient documentation

## 2011-10-16 DIAGNOSIS — J4489 Other specified chronic obstructive pulmonary disease: Secondary | ICD-10-CM | POA: Insufficient documentation

## 2011-10-16 MED ORDER — ALBUTEROL SULFATE (5 MG/ML) 0.5% IN NEBU
5.0000 mg | INHALATION_SOLUTION | Freq: Once | RESPIRATORY_TRACT | Status: AC
  Administered 2011-10-16: 5 mg via RESPIRATORY_TRACT
  Filled 2011-10-16: qty 1

## 2011-10-16 MED ORDER — AZITHROMYCIN 250 MG PO TABS: 250.0000 mg | ORAL_TABLET | Freq: Every day | ORAL | Status: DC

## 2011-10-16 MED ORDER — HYDROCOD POLST-CHLORPHEN POLST 10-8 MG/5ML PO LQCR: 5.0000 mL | Freq: Two times a day (BID) | ORAL | Status: DC | PRN

## 2011-10-16 MED ORDER — PREDNISONE 10 MG PO TABS: 20.0000 mg | ORAL_TABLET | Freq: Two times a day (BID) | ORAL | Status: DC

## 2011-10-16 NOTE — ED Notes (Signed)
Pt reports has had sinus infection x 1 week.  Has been taking claritin, flonase, and duoneb.  Pt says now feels like congestion has moved to chest.  Says has productive cough with green sputum.  Unsure if has fever.

## 2011-10-16 NOTE — ED Provider Notes (Signed)
History     CSN: [id number]  Arrival date & time 10/16/11  1634   First MD Initiated Contact with Patient 10/16/11 1642      Chief Complaint  Patient presents with  . Shortness of Breath    (Consider location/radiation/quality/duration/timing/severity/associated sxs/prior treatment) HPI Comments: Patient with history of morbid obesity, asthma, tobacco abuse.  Presents with a one week history of cough, congestion, now feels short of breath.  [facility] the [facility] last week and given an inhaler, other meds which do not seem to be helping.  Cough is non-productive.  There is no fever.    Patient is a 36 y.o. female presenting with shortness of breath. The history is provided by the patient.  Shortness of Breath  The current episode started more than 1 week ago. The onset was gradual. The problem occurs continuously. The problem has been gradually worsening. The problem is moderate. Nothing relieves the symptoms. Nothing aggravates the symptoms. Associated symptoms include cough, shortness of breath and wheezing. Pertinent negatives include no chest pain and no fever.    Past Medical History  Diagnosis Date  . Nerve damage   . Sleep apnea   . COPD (chronic obstructive pulmonary disease)   . Asthma   . Hypoglycemia associated with diabetes   . Insomnia   . Gallstones   . Social anxiety disorder   . Cancer     Past Surgical History  Procedure Date  . Abdominal hysterectomy   . Dilation and curettage of uterus     No family history on file.  History  Substance Use Topics  . Smoking status: Current Every Day Smoker -- 1.0 packs/day  . Smokeless tobacco: Not on file  . Alcohol Use: Yes     occ    OB History    Grav Para Term Preterm Abortions TAB SAB Ect Mult Living                  Review of Systems  Constitutional: Negative for fever.  Respiratory: Positive for cough, shortness of breath and wheezing.   Cardiovascular: Negative for chest pain.  All  other systems reviewed and are negative.    Allergies  Bee venom; Clindamycin/lincomycin; and Slo-bid gyrocaps  Home Medications   Current Outpatient Rx  Name Route Sig Dispense Refill  . ALBUTEROL SULFATE HFA 108 (90 BASE) MCG/ACT IN AERS Inhalation Inhale 2 puffs into the lungs every 6 (six) hours as needed. Shortness of Breath    . ALPRAZOLAM 0.5 MG PO TABS Oral Take 0.5 mg by mouth 3 (three) times daily as needed. Anxiety    . ARIPIPRAZOLE 20 MG PO TABS Oral Take 20 mg by mouth 2 (two) times daily.    . ASPIRIN 81 MG PO TABS Oral Take 81 mg by mouth daily.    Marland Kitchen CITALOPRAM HYDROBROMIDE 20 MG PO TABS Oral Take 20 mg by mouth at bedtime.    Marland Kitchen CLONAZEPAM 0.5 MG PO TABS Oral Take 0.5 mg by mouth 2 (two) times daily. Anxiety    . CYCLOBENZAPRINE HCL 10 MG PO TABS Oral Take 10 mg by mouth daily as needed. Muscle Spasms    . FLUTICASONE PROPIONATE 50 MCG/ACT NA SUSP Nasal Place 1 spray into the nose at bedtime.     Marland Kitchen FLUTICASONE-SALMETEROL 250-50 MCG/DOSE IN AEPB Inhalation Inhale 1 puff into the lungs every 12 (twelve) hours.    Marland Kitchen GABAPENTIN 300 MG PO CAPS Oral Take 300 mg by mouth 3 (three) times daily.    Marland Kitchen  IPRATROPIUM-ALBUTEROL 0.5-2.5 (3) MG/3ML IN SOLN Nebulization Take 3 mLs by nebulization every 6 (six) hours as needed. Shortness of Breath    . METFORMIN HCL 500 MG PO TABS Oral Take 500 mg by mouth 2 (two) times daily.     Marland Kitchen MODAFINIL 200 MG PO TABS Oral Take 200 mg by mouth 2 (two) times daily.    Marland Kitchen MONTELUKAST SODIUM 10 MG PO TABS Oral Take 10 mg by mouth at bedtime.    . OMEPRAZOLE 20 MG PO CPDR Oral Take 20 mg by mouth daily.    Marland Kitchen ZOLPIDEM TARTRATE 10 MG PO TABS Oral Take 10 mg by mouth at bedtime.       BP 134/75  Pulse 84  Temp 98.4 F (36.9 C) (Oral)  Resp 24  Ht 5\' 5"  (1.651 m)  Wt 330 lb (149.687 kg)  BMI 54.91 kg/m2  SpO2 97%  Physical Exam  Nursing note and vitals reviewed. Constitutional: She is oriented to person, place, and time. She appears well-developed  and well-nourished. No distress.  HENT:  Head: Normocephalic and atraumatic.  Mouth/Throat: Oropharynx is clear and moist.  Neck: Normal range of motion. Neck supple.  Cardiovascular: Normal rate and regular rhythm.   No murmur heard. Pulmonary/Chest: Effort normal and breath sounds normal.       The lungs have slight rhonchi bilaterally.  She is in no respiratory distress.  Abdominal: Soft. Bowel sounds are normal. She exhibits no distension. There is no tenderness.  Musculoskeletal: Normal range of motion. She exhibits no edema.  Lymphadenopathy:    She has no cervical adenopathy.  Neurological: She is alert and oriented to person, place, and time.  Skin: Skin is warm and dry. She is not diaphoretic.    ED Course  Procedures (including critical care time)  Labs Reviewed - No data to display No results found.   No diagnosis found.    MDM  The patient presents here complaining of shortness of breath.  She was seen last [facility] at [facility] dept and given meds that have not helped.  She is now reporting green sputum and chest congestion.  I suspect she is developing bronchitis.  The chest xray is clear and she was given an albuterol neb.  She will be discharged with an antibiotic.          Geoffery Lyons, MD 10/16/11 [phone]

## 2011-10-16 NOTE — ED Notes (Signed)
Pt presents to Ed with URI symptoms, productive cough, nasal congestion, rib pain secondary to cough, and sore throat x 1 week. Pt states has green sputum. Denies fever at this time. Pt was seen in [facility] and treated with Claritin, flow- nase . Without relief. Pt states feels like cold has moved to her chest.  NAD noted.

## 2012-01-27 DIAGNOSIS — R079 Chest pain, unspecified: Secondary | ICD-10-CM

## 2012-07-04 ENCOUNTER — Encounter: Payer: Self-pay | Admitting: Physician Assistant

## 2012-07-04 ENCOUNTER — Ambulatory Visit (INDEPENDENT_AMBULATORY_CARE_PROVIDER_SITE_OTHER): Payer: Medicaid Other | Admitting: Physician Assistant

## 2012-07-04 VITALS — BP 99/64 | HR 80 | Ht 65.0 in | Wt 322.8 lb

## 2012-07-04 DIAGNOSIS — R079 Chest pain, unspecified: Secondary | ICD-10-CM | POA: Insufficient documentation

## 2012-07-04 DIAGNOSIS — I82409 Acute embolism and thrombosis of unspecified deep veins of unspecified lower extremity: Secondary | ICD-10-CM | POA: Insufficient documentation

## 2012-07-04 DIAGNOSIS — R072 Precordial pain: Secondary | ICD-10-CM

## 2012-07-04 NOTE — Progress Notes (Signed)
Primary Cardiologist: Simona Huh, MD (new)   HPI: Post hospital followup from [facility], following recent diagnosis of LLE DVT.   She had just been discharged from [facility] with diagnoses of RLE superficial thrombophlebitis, treated as an outpatient with 5 day course of ibuprofen. However, she returned to the ED with complaint of left leg pain, and was found to have nonocclusive DVT at the left CFV. D-dimer 2.9.  A VQ scan on April 13 yielded low probability for pulmonary embolism. A CTA of the chest on May 16 was also negative for pulmonary embolus.  Patient was initially placed on IV heparin and Coumadin, then transitioned to Xarelto 15 mg twice a day (x21 days), to be followed by 20 mg daily.  Patient did not have an echocardiogram, and we were not formally consulted. She presents with no known history of heart disease.  She presents today reporting exertional CP and DOE. She is a somewhat difficult historian, but clearly denies any prior formal cardiac evaluation or diagnostic testing. Her cardiac risk factors are notable for tobacco smoking only, with no formal history of HTN, DM, or HLD, and no family history of premature CAD.   Twelve-lead EKG on May 16 revealed sinus arrhythmia at 63 bpm; LADoh; poor R wave progression  Allergies  Allergen Reactions  . Aspartame And Phenylalanine Anaphylaxis  . Bee Venom Anaphylaxis    Has EPIPEN  . Clindamycin/Lincomycin Other (See Comments)    REACTION: "puts me in the hospital", UNKNOWN  . Slo-Bid Gyrocaps (Theophylline) Rash and Other (See Comments)    REACTION: Hyperactivity     Current Outpatient Prescriptions  Medication Sig Dispense Refill  . albuterol (PROVENTIL HFA;VENTOLIN HFA) 108 (90 BASE) MCG/ACT inhaler Inhale 2 puffs into the lungs every 6 (six) hours as needed. Shortness of Breath      . ARIPiprazole (ABILIFY) 10 MG tablet Take 10 mg by mouth daily.      Marland Kitchen azithromycin (ZITHROMAX) 250 MG tablet Take 1 tablet (250 mg total) by mouth  daily. Take first 2 tablets together, then 1 every day until finished.  6 tablet  0  . citalopram (CELEXA) 20 MG tablet Take 30 mg by mouth at bedtime.       . cyclobenzaprine (FLEXERIL) 10 MG tablet Take 10 mg by mouth 2 (two) times daily as needed. Muscle Spasms      . diazepam (VALIUM) 5 MG tablet Take 5 mg by mouth every 6 (six) hours as needed for anxiety.      Marland Kitchen EPINEPHrine (EPIPEN 2-PAK) 0.3 mg/0.3 mL DEVI Inject 0.3 mg into the muscle Once PRN.      . fluticasone (FLONASE) 50 MCG/ACT nasal spray Place 1 spray into the nose at bedtime.       . Fluticasone-Salmeterol (ADVAIR) 250-50 MCG/DOSE AEPB Inhale 1 puff into the lungs every 12 (twelve) hours.      . gabapentin (NEURONTIN) 300 MG capsule Take 300 mg by mouth 3 (three) times daily.      Marland Kitchen omeprazole (PRILOSEC) 40 MG capsule Take 40 mg by mouth daily.      . Rivaroxaban (XARELTO) 20 MG TABS Take 20 mg by mouth daily.      . traZODone (DESYREL) 150 MG tablet Take 300 mg by mouth at bedtime.       No current facility-administered medications for this visit.    Past Medical History  Diagnosis Date  . Nerve damage   . Sleep apnea   . COPD (chronic obstructive pulmonary disease)   .  Asthma   . Hypoglycemia associated with diabetes   . Insomnia   . Gallstones   . Social anxiety disorder   . Cancer   . DVT (deep venous thrombosis)     LLE, 04/2012, Xarelto anticoagulation initiated    Past Surgical History  Procedure Laterality Date  . Abdominal hysterectomy    . Dilation and curettage of uterus      History   Social History  . Marital Status: Widowed    Spouse Name: N/A    Number of Children: N/A  . Years of Education: N/A   Occupational History  . Not on file.   Social History Main Topics  . Smoking status: Current Every Day Smoker -- 1.00 packs/day  . Smokeless tobacco: Not on file     Comment: 1-2 packs per day, depending on nerves   . Alcohol Use: Yes     Comment: occ  . Drug Use: No  . Sexually Active:  Not on file   Other Topics Concern  . Not on file   Social History Narrative  . No narrative on file    No family history on file.  ROS: no nausea, vomiting; no fever, chills; no melena, hematochezia; no claudication  PHYSICAL EXAM: BP 99/64  Pulse 80  Ht 5\' 5"  (1.651 m)  Wt 322 lb 12.8 oz (146.421 kg)  BMI 53.72 kg/m2  SpO2 97% GENERAL: 37 year old female, morbidly obese; NAD HEENT: NCAT, PERRLA, EOMI; sclera clear; no xanthelasma NECK: palpable bilateral carotid pulses, no bruits; unable to assess JVD LUNGS: RLL coarse wheezes, otherwise clear on left CARDIAC: RRR (S1, S2); no significant murmurs; no rubs or gallops ABDOMEN: soft, protuberant EXTREMETIES: no significant peripheral edema SKIN: warm/dry; no obvious rash/lesions MUSCULOSKELETAL: no joint deformity NEURO: no focal deficit; NL affect   EKG: reviewed and available in Electronic Records   ASSESSMENT & PLAN:  Chest pain Patient presents with complaint of atypical chest pain, with associated dyspnea, with no known history of heart disease. She also is morbidly obese and has a history of both asthma and severe OSA. She was recently hospitalized here at [facility] on 2 separate occasions, initially with diagnosis of RLE superficial thrombophlebitis, followed by subsequent diagnosis of LLE DVT. Extensive workup yielded no evidence of pulmonary embolus. She also had NL troponins in both cases. Following review Dr. Diona Browner, plan is to evaluate further with a complete echocardiogram. If this is within NL limits, then no further cardiac workup is indicated. Patient was advised to followup with her primary care team for ongoing monitoring and management of recent DVT, for which she was placed on Xarelto anticoagulation.    Gene Lynore Coscia, PAC

## 2012-07-04 NOTE — Patient Instructions (Addendum)
Continue all current medications. Echo Office will contact with results via phone or letter.   Follow up as needed  Continue to follow up with primary MD regarding DVT in leg

## 2012-07-04 NOTE — Assessment & Plan Note (Signed)
Patient presents with complaint of atypical chest pain, with associated dyspnea, with no known history of heart disease. She also is morbidly obese and has a history of both asthma and severe OSA. She was recently hospitalized here at [facility] on 2 separate occasions, initially with diagnosis of RLE superficial thrombophlebitis, followed by subsequent diagnosis of LLE DVT. Extensive workup yielded no evidence of pulmonary embolus. She also had NL troponins in both cases. Following review Dr. Diona Browner, plan is to evaluate further with a complete echocardiogram. If this is within NL limits, then no further cardiac workup is indicated. Patient was advised to followup with her primary care team for ongoing monitoring and management of recent DVT, for which she was placed on Xarelto anticoagulation.

## 2012-07-12 ENCOUNTER — Other Ambulatory Visit: Payer: Self-pay

## 2012-07-12 ENCOUNTER — Other Ambulatory Visit (INDEPENDENT_AMBULATORY_CARE_PROVIDER_SITE_OTHER): Payer: Medicaid Other

## 2012-07-12 DIAGNOSIS — R072 Precordial pain: Secondary | ICD-10-CM

## 2012-07-12 DIAGNOSIS — I6529 Occlusion and stenosis of unspecified carotid artery: Secondary | ICD-10-CM

## 2012-07-17 ENCOUNTER — Telehealth: Payer: Self-pay | Admitting: *Deleted

## 2012-07-17 NOTE — Telephone Encounter (Signed)
Message copied by Burnell Blanks on Tue Jul 17, 2012 11:19 AM ------      Message from: Eustace Moore      Created: Tue Jul 17, 2012 11:05 AM                   ----- Message -----         From: Jonelle Sidle, MD         Sent: 07/15/2012   8:14 PM           To: Prescott Parma, PA-C, Lesle Chris, LPN            Normal study  Ordered by Mr. Serpe PAC at recent office visit  See note - doubt that further cardiac workup is needed now  She should followup with primary MD. ------

## 2012-07-17 NOTE — Telephone Encounter (Signed)
Advised patient of results.  

## 2012-10-15 ENCOUNTER — Other Ambulatory Visit: Payer: Self-pay | Admitting: *Deleted

## 2012-10-15 DIAGNOSIS — M7989 Other specified soft tissue disorders: Secondary | ICD-10-CM

## 2012-11-07 ENCOUNTER — Encounter (INDEPENDENT_AMBULATORY_CARE_PROVIDER_SITE_OTHER): Payer: Self-pay

## 2012-11-07 ENCOUNTER — Encounter: Payer: Self-pay | Admitting: Family Medicine

## 2012-11-07 ENCOUNTER — Ambulatory Visit (INDEPENDENT_AMBULATORY_CARE_PROVIDER_SITE_OTHER): Payer: Medicaid Other | Admitting: Family Medicine

## 2012-11-07 VITALS — BP 153/101 | HR 84 | Temp 98.6°F | Ht 65.0 in | Wt 337.8 lb

## 2012-11-07 DIAGNOSIS — I10 Essential (primary) hypertension: Secondary | ICD-10-CM

## 2012-11-07 LAB — POCT GLYCOSYLATED HEMOGLOBIN (HGB A1C): Hemoglobin A1C: 5

## 2012-11-07 MED ORDER — TRIAMTERENE-HCTZ 37.5-25 MG PO CAPS: 2.0000 | ORAL_CAPSULE | ORAL | Status: DC

## 2012-11-07 NOTE — Progress Notes (Signed)
New Patient History and Physical  Patient name: Judith Rice Medical record number: [id number] Date of birth: 09/15/1975 Age: 37 y.o. Gender: female  Primary Care Provider: Rudi Heap, MD  Chief Complaint: Hospital followup/establish care History of Present Illness: The patient presents today for hospital followup. Patient has a baseline history of multiple medical issues including major depression with history of drug overdose several years ago, hypertension COPD, morbid obesity, DVT. Patient was recently discharged from [facility] for questionable psychotic flare. Patient states that she was admitted malleolus for approximately 2-3 weeks. Prior to that, she was being managed a day mark for mood. Per patient, she has some her medications changed and this triggered a flare of her mood. Is currently taking Effexor predominantly for her mood. Is also on other mood medications including trazodone and Klonopin. Patient has followup with [facility] pending. Mood is fairly stable per patient. No homicidal or suicidal ideations currently. Patient states she secondary to multiple doctors in the past, most recently been taking care of with [facility] family practice. Patient will like to establish care here to streamline all of her medical issues. Noted elevated blood pressure today. Asymptomatic. Patient denies any chest pains or shortness of breath. Patient is noted to be on NSAIDs as well as prednisone recently. Is taking Maxzide 37.5/25 one tablet daily currently. Has also had increased appetite and does not deny eating salty foods. Patient also noted a positive DVT within the past 3-4 months. Per patient, this was diagnosed at [facility]. Has been taking Xarelto to also for this. Per patient, has vascular followup on 10/23.   Past Medical History: Patient Active Problem List   Diagnosis Date Noted  . Chest pain 07/04/2012  . DVT (deep venous thrombosis)    Past Medical History   Diagnosis Date  . Nerve damage   . Sleep apnea   . COPD (chronic obstructive pulmonary disease)   . Asthma   . Hypoglycemia associated with diabetes   . Insomnia   . Gallstones   . Social anxiety disorder   . Cancer   . DVT (deep venous thrombosis)     LLE, 04/2012, Xarelto anticoagulation initiated  . Depression   . Suicidal ideations   . Baker's cyst     left     Past Surgical History: Past Surgical History  Procedure Laterality Date  . Abdominal hysterectomy    . Dilation and curettage of uterus      Social History: History   Social History  . Marital Status: Widowed    Spouse Name: N/A    Number of Children: N/A  . Years of Education: N/A   Social History Main Topics  . Smoking status: Current Every Day Smoker -- 1.00 packs/day  . Smokeless tobacco: None     Comment: 1-2 packs per day, depending on nerves   . Alcohol Use: Yes     Comment: occ  . Drug Use: Yes    Special: Marijuana     Comment: daily  . Sexual Activity: None   Other Topics Concern  . None   Social History Narrative  . None    Family History: History reviewed. No pertinent family history.  Allergies: Allergies  Allergen Reactions  . Aspartame And Phenylalanine Anaphylaxis  . Bee Venom Anaphylaxis    Has EPIPEN  . Clindamycin/Lincomycin Other (See Comments)    REACTION: "puts me in the hospital", UNKNOWN  . Slo-Bid Gyrocaps [Theophylline] Rash and Other (See Comments)    REACTION: Hyperactivity  Current Outpatient Prescriptions  Medication Sig Dispense Refill  . albuterol-ipratropium (COMBIVENT) 18-103 MCG/ACT inhaler Inhale 2 puffs into the lungs every 6 (six) hours as needed for wheezing.      . budesonide-formoterol (SYMBICORT) 80-4.5 MCG/ACT inhaler Inhale 2 puffs into the lungs 2 (two) times daily.      . clonazePAM (KLONOPIN) 1 MG tablet Take 1 mg by mouth at bedtime as needed for anxiety.      . cyclobenzaprine (FLEXERIL) 10 MG tablet Take 10 mg by mouth 2 (two)  times daily as needed. Muscle Spasms      . EPINEPHrine (EPIPEN 2-PAK) 0.3 mg/0.3 mL DEVI Inject 0.3 mg into the muscle Once PRN.      . fluticasone (FLONASE) 50 MCG/ACT nasal spray Place 1 spray into the nose at bedtime.       . gabapentin (NEURONTIN) 300 MG capsule Take 300 mg by mouth 3 (three) times daily.      . hydrOXYzine (ATARAX/VISTARIL) 50 MG tablet Take 50 mg by mouth 3 (three) times daily as needed for itching.      . naproxen (NAPROSYN) 500 MG tablet Take 500 mg by mouth 2 (two) times daily with a meal.      . omeprazole (PRILOSEC) 20 MG capsule Take 20 mg by mouth daily.      . Rivaroxaban (XARELTO) 20 MG TABS Take 20 mg by mouth daily.      . traZODone (DESYREL) 150 MG tablet Take 300 mg by mouth at bedtime.      . triamterene-hydrochlorothiazide (DYAZIDE) 37.5-25 MG per capsule Take 2 each (2 capsules total) by mouth every morning.  60 capsule  2  . venlafaxine XR (EFFEXOR-XR) 37.5 MG 24 hr capsule Take 37.5 mg by mouth daily.       No current facility-administered medications for this visit.   Review Of Systems: 12 point ROS negative except as noted above in HPI.  Physical Exam: Filed Vitals:   11/07/12 0926  BP: 153/101  Pulse: 84  Temp: 98.6 F (37 C)    General: alert, cooperative and morbidly obese HEENT: PERRLA and extra ocular movement intact Heart: S1, S2 normal, no murmur, rub or gallop, regular rate and rhythm Lungs:  Positive faint expiratory wheezes on exam, good respiratory effort. Abdomen: abdomen is soft without significant tenderness, masses, organomegaly or guarding Extremities: extremities normal, atraumatic, no cyanosis or edema Skin:no rashes, no ecchymoses Neurology: normal without focal findings  Labs and Imaging: EKG: NSR  Assessment and Plan: HTN (hypertension) - Plan: Comprehensive metabolic panel, TSH, POCT A1C, EKG 12-Lead, NMR, lipoprofile, CANCELED: NMR Lipoprofile with Lipids  Patient presents today with multiple medical issues.  Would like to obtain patient's prior medical records before making any significant changes. We'll start with hypertension as this is uncontrolled. Plan on doubling dose of Maxzide given markedly elevated blood pressure. Asymptomatic today. We'll check riskstratification labs including A1c, TSH, CMP, lipid profile. Plan for patient to return in one week pending medical records. Encourage followup with psychiatry for mood issues. Currently stable." This is the best that I have felt" Continues are also pending vascular followup ear      Doree Albee MD

## 2012-11-08 LAB — COMPREHENSIVE METABOLIC PANEL
ALT: 12 IU/L (ref 0–32)
Albumin: 4.1 g/dL (ref 3.5–5.5)
Alkaline Phosphatase: 85 IU/L (ref 39–117)
BUN/Creatinine Ratio: 19 (ref 8–20)
BUN: 15 mg/dL (ref 6–20)
CO2: 23 mmol/L (ref 18–29)
Calcium: 9 mg/dL (ref 8.7–10.2)
Chloride: 97 mmol/L (ref 97–108)
Creatinine, Ser: 0.79 mg/dL (ref 0.57–1.00)
GFR calc non Af Amer: 97 mL/min/{1.73_m2} (ref >59)
Globulin, Total: 3.3 g/dL (ref 1.5–4.5)
Glucose: 85 mg/dL (ref 65–99)
Potassium: 4 mmol/L (ref 3.5–5.2)
Total Bilirubin: 0.2 mg/dL (ref 0.0–1.2)
Total Protein: 7.4 g/dL (ref 6.0–8.5)

## 2012-11-08 LAB — TSH: TSH: 1.9 u[IU]/mL (ref 0.450–4.500)

## 2012-11-09 LAB — NMR, LIPOPROFILE
HDL Particle Number: 31.4 umol/L (ref >=30.5)
LDL Particle Number: 2022 nmol/L — ABNORMAL HIGH (ref <1000)
LDLC SERPL CALC-MCNC: 124 mg/dL — ABNORMAL HIGH (ref <100)
LP-IR Score: 72 — ABNORMAL HIGH (ref <=45)
Triglycerides by NMR: 231 mg/dL — ABNORMAL HIGH (ref <150)

## 2012-11-13 ENCOUNTER — Ambulatory Visit: Payer: Medicaid Other | Admitting: Family Medicine

## 2012-11-13 ENCOUNTER — Other Ambulatory Visit: Payer: Self-pay | Admitting: Family Medicine

## 2012-11-13 MED ORDER — PRAVASTATIN SODIUM 40 MG PO TABS: 60.0000 mg | ORAL_TABLET | Freq: Every day | ORAL | Status: DC

## 2012-11-14 ENCOUNTER — Encounter: Payer: Self-pay | Admitting: Vascular Surgery

## 2012-11-15 ENCOUNTER — Ambulatory Visit: Payer: Medicaid Other | Admitting: Family Medicine

## 2012-11-15 ENCOUNTER — Ambulatory Visit (INDEPENDENT_AMBULATORY_CARE_PROVIDER_SITE_OTHER): Payer: Medicaid Other | Admitting: Family Medicine

## 2012-11-15 ENCOUNTER — Encounter: Payer: Self-pay | Admitting: Family Medicine

## 2012-11-15 ENCOUNTER — Encounter (HOSPITAL_COMMUNITY): Payer: Medicaid Other

## 2012-11-15 ENCOUNTER — Encounter: Payer: Medicaid Other | Admitting: Vascular Surgery

## 2012-11-15 VITALS — BP 124/90 | HR 85 | Temp 98.0°F | Ht 65.0 in | Wt 329.0 lb

## 2012-11-15 DIAGNOSIS — R062 Wheezing: Secondary | ICD-10-CM

## 2012-11-15 DIAGNOSIS — J40 Bronchitis, not specified as acute or chronic: Secondary | ICD-10-CM

## 2012-11-15 DIAGNOSIS — Z7189 Other specified counseling: Secondary | ICD-10-CM

## 2012-11-15 DIAGNOSIS — Z716 Tobacco abuse counseling: Secondary | ICD-10-CM

## 2012-11-15 DIAGNOSIS — F17200 Nicotine dependence, unspecified, uncomplicated: Secondary | ICD-10-CM

## 2012-11-15 DIAGNOSIS — J069 Acute upper respiratory infection, unspecified: Secondary | ICD-10-CM

## 2012-11-15 MED ORDER — PREDNISONE 50 MG PO TABS: ORAL_TABLET | ORAL | Status: DC

## 2012-11-15 MED ORDER — AZITHROMYCIN 250 MG PO TABS: ORAL_TABLET | ORAL | Status: DC

## 2012-11-15 MED ORDER — BUDESONIDE-FORMOTEROL FUMARATE 80-4.5 MCG/ACT IN AERO: 2.0000 | INHALATION_SPRAY | Freq: Two times a day (BID) | RESPIRATORY_TRACT | Status: DC

## 2012-11-15 NOTE — Progress Notes (Signed)
  Subjective:    Patient ID: Judith Rice, female    DOB: 1975-12-16, 37 y.o.   MRN: [id number]  HPI URI Symptoms Onset: 2 weeks  Description: rhinorrhea, nasal congestion, cough, wheezing  Modifying factors:  Baseline hx/o asthma, morbid obesity, OSA, depression with recent discharge from [facility]   Symptoms Nasal discharge: yes Fever: no Sore throat: no Cough: yes Wheezing: yes Ear pain: no GI symptoms: no Sick contacts: no  Red Flags  Stiff neck: no Dyspnea: minimal  Rash: no Swallowing difficulty: no  Sinusitis Risk Factors Headache/face pain: no Double sickening: no tooth pain: no  Allergy Risk Factors Sneezing: no Itchy scratchy throat: n Seasonal symptoms: no  Flu Risk Factors Headache: no muscle aches: no severe fatigue: no     Review of Systems  All other systems reviewed and are negative.       Objective:   Physical Exam  Constitutional: She is oriented to person, place, and time.  Morbidly obese   HENT:  Head: Normocephalic and atraumatic.  Right Ear: External ear normal.  Left Ear: External ear normal.  +nasal erythema, rhinorrhea bilaterally, + post oropharyngeal erythema    Eyes: Conjunctivae are normal. Pupils are equal, round, and reactive to light.  Neck: Normal range of motion. Neck supple.  Cardiovascular: Normal rate and regular rhythm.   Pulmonary/Chest: Effort normal. She has wheezes.  Abdominal: Soft.  Musculoskeletal: Normal range of motion.  Neurological: She is alert and oriented to person, place, and time.  Skin: Skin is warm.          Assessment & Plan:  URI (upper respiratory infection) - Plan: predniSONE (DELTASONE) 50 MG tablet, azithromycin (ZITHROMAX) 250 MG tablet  Bronchitis - Plan: predniSONE (DELTASONE) 50 MG tablet, azithromycin (ZITHROMAX) 250 MG tablet  Wheezing - Plan: predniSONE (DELTASONE) 50 MG tablet, azithromycin (ZITHROMAX) 250 MG tablet  Likely viral/allergic induced upper respiratory  infection with overlapping asthma exacerbation. Solu-Medrol 125 mg IM x1. Prednisone x5 days. Discussed steroid use in the setting of history of depression and psychosis. Patient states she's taken this in the past with no issues. No homicidal or suicidal ideations today. Z-Pak for atypical coverage. Discussed smoking cessation at length. Discussed infectious and respiratory rate flax. Follow up as needed.

## 2012-11-20 ENCOUNTER — Telehealth: Payer: Self-pay | Admitting: Family Medicine

## 2012-11-21 NOTE — Telephone Encounter (Signed)
Patient will run out of psych medications before her appointment. Her appointment for intake enrollment is on 10/31. The appt with the psychiatrist will most likely be next month. Would you be able to refill them for one month?

## 2012-11-22 ENCOUNTER — Other Ambulatory Visit: Payer: Self-pay | Admitting: Family Medicine

## 2012-11-22 ENCOUNTER — Ambulatory Visit: Payer: Medicaid Other | Admitting: Family Medicine

## 2012-11-22 MED ORDER — HYDROXYZINE HCL 50 MG PO TABS: 50.0000 mg | ORAL_TABLET | Freq: Three times a day (TID) | ORAL | Status: DC | PRN

## 2012-11-22 MED ORDER — CLONAZEPAM 1 MG PO TABS: 1.0000 mg | ORAL_TABLET | Freq: Every evening | ORAL | Status: DC | PRN

## 2012-11-22 NOTE — Telephone Encounter (Signed)
Refilled Effexor and Atarax. Discussed with Dr. Christell Constant.

## 2012-11-22 NOTE — Telephone Encounter (Signed)
Called in klonopin #10. She will need to follow up with psych for any additional refills.

## 2012-11-23 ENCOUNTER — Telehealth: Payer: Self-pay | Admitting: *Deleted

## 2012-11-23 NOTE — Telephone Encounter (Signed)
Script for klonipin readt, pt aware.

## 2012-11-27 ENCOUNTER — Encounter: Payer: Self-pay | Admitting: Family Medicine

## 2012-11-27 ENCOUNTER — Ambulatory Visit (INDEPENDENT_AMBULATORY_CARE_PROVIDER_SITE_OTHER): Payer: Medicaid Other | Admitting: Family Medicine

## 2012-11-27 VITALS — BP 147/94 | HR 98 | Temp 98.6°F | Resp 94 | Ht 65.0 in | Wt 334.0 lb

## 2012-11-27 DIAGNOSIS — Z716 Tobacco abuse counseling: Secondary | ICD-10-CM

## 2012-11-27 DIAGNOSIS — Z7189 Other specified counseling: Secondary | ICD-10-CM

## 2012-11-27 DIAGNOSIS — J40 Bronchitis, not specified as acute or chronic: Secondary | ICD-10-CM

## 2012-11-27 DIAGNOSIS — J069 Acute upper respiratory infection, unspecified: Secondary | ICD-10-CM

## 2012-11-27 DIAGNOSIS — F17200 Nicotine dependence, unspecified, uncomplicated: Secondary | ICD-10-CM

## 2012-11-27 DIAGNOSIS — R05 Cough: Secondary | ICD-10-CM

## 2012-11-27 DIAGNOSIS — R062 Wheezing: Secondary | ICD-10-CM

## 2012-11-27 MED ORDER — SODIUM CHLORIDE 0.9 % IV SOLN: 125.0000 mg | Freq: Once | INTRAVENOUS | Status: DC

## 2012-11-27 MED ORDER — PREDNISONE 50 MG PO TABS: ORAL_TABLET | ORAL | Status: DC

## 2012-11-27 MED ORDER — METHYLPREDNISOLONE SODIUM SUCC 125 MG IJ SOLR
125.0000 mg | Freq: Once | INTRAMUSCULAR | Status: AC
  Administered 2012-11-27: 125 mg via INTRAMUSCULAR

## 2012-11-27 NOTE — Progress Notes (Signed)
  Subjective:    Patient ID: Judith Rice, female    DOB: 1975/06/04, 37 y.o.   MRN: [id number]  HPI Pt presents today for general follow up  Pt was seen last for URI with secondary asthma exacerbation in setting of baseline hx/o asthma, morbid obesity, OSA, > 1 PPD smoker.  Was placed on zpak and prednisone.  Sxs have improved but wheezing has persisted.  Still smoking 1 PPD. No fevers or chills.  Cough has improved.  Mood stable. No psychosis with steroids per pt.  Would like another round of steroid to help with wheezing.   Has follow up with [facility] for psych issues in 1-2 months. Had psych screening last week with med changes, but pt is unsure. Also waiting on medicaid approval of meds.   Review of Systems  All other systems reviewed and are negative.       Objective:   Physical Exam  Constitutional:  Morbidly obese    HENT:  Head: Normocephalic and atraumatic.  Mouth/Throat: Oropharynx is clear and moist.  Eyes: Conjunctivae are normal. Pupils are equal, round, and reactive to light.  Neck: Normal range of motion.  Cardiovascular: Normal rate and regular rhythm.   Pulmonary/Chest: Effort normal. She has wheezes.  Abdominal: Soft.  Neurological: She is alert.  Skin: Skin is warm.          Assessment & Plan:  Cough - Plan: POCT CBC  Wheezing - Plan: methylPREDNISolone sodium succinate (SOLU-MEDROL) 130 mg in sodium chloride 0.9 % 50 mL IVPB, predniSONE (DELTASONE) 50 MG tablet  URI (upper respiratory infection)  Bronchitis  Will give pt another shot of solumedrol with extended course of prednisone Stressed smoking cessation.  Discussed infectious and resp red flags at length.  Also discussed somking cessation in setting of prior VTE history and asthma.  No tachycardia or hypoxia today.  Go to ER if sxs worsen.   Still pending medical records to better discern medical history in setting depression s/p recent suicide attempt and hx/o cocaine abuse. Would  like to wait on these records before delving deeper into medical management with this pt. Pt expressed understanding of this. Continue follow up with psych (is pending follow up with [facility] in [facility] per pt).

## 2012-12-03 ENCOUNTER — Telehealth: Payer: Self-pay | Admitting: Family Medicine

## 2012-12-03 NOTE — Telephone Encounter (Signed)
Patient hysterical and requesting appt with Dr Alvester Morin. Unable to really tell what patient is needing to be seen for. States that we "saved her life" by giving her and appt for tomorrow

## 2012-12-04 ENCOUNTER — Ambulatory Visit (INDEPENDENT_AMBULATORY_CARE_PROVIDER_SITE_OTHER): Payer: Medicaid Other | Admitting: Family Medicine

## 2012-12-04 ENCOUNTER — Encounter (HOSPITAL_COMMUNITY): Payer: Self-pay | Admitting: Emergency Medicine

## 2012-12-04 ENCOUNTER — Emergency Department (HOSPITAL_COMMUNITY): Payer: MEDICAID

## 2012-12-04 ENCOUNTER — Emergency Department (HOSPITAL_COMMUNITY)
Admission: EM | Admit: 2012-12-04 | Discharge: 2012-12-12 | Disposition: A | Discharge status: Home / Self Care | Payer: MEDICAID | Attending: Emergency Medicine | Admitting: Emergency Medicine

## 2012-12-04 VITALS — BP 136/86 | HR 118 | Temp 100.5°F | Ht 65.0 in | Wt 334.0 lb

## 2012-12-04 DIAGNOSIS — Z8719 Personal history of other diseases of the digestive system: Secondary | ICD-10-CM | POA: Insufficient documentation

## 2012-12-04 DIAGNOSIS — Z791 Long term (current) use of non-steroidal anti-inflammatories (NSAID): Secondary | ICD-10-CM | POA: Insufficient documentation

## 2012-12-04 DIAGNOSIS — Z859 Personal history of malignant neoplasm, unspecified: Secondary | ICD-10-CM | POA: Insufficient documentation

## 2012-12-04 DIAGNOSIS — J4489 Other specified chronic obstructive pulmonary disease: Secondary | ICD-10-CM | POA: Insufficient documentation

## 2012-12-04 DIAGNOSIS — F23 Brief psychotic disorder: Secondary | ICD-10-CM

## 2012-12-04 DIAGNOSIS — Z86718 Personal history of other venous thrombosis and embolism: Secondary | ICD-10-CM | POA: Insufficient documentation

## 2012-12-04 DIAGNOSIS — IMO0002 Reserved for concepts with insufficient information to code with codable children: Secondary | ICD-10-CM | POA: Insufficient documentation

## 2012-12-04 DIAGNOSIS — Z79899 Other long term (current) drug therapy: Secondary | ICD-10-CM | POA: Insufficient documentation

## 2012-12-04 DIAGNOSIS — F319 Bipolar disorder, unspecified: Secondary | ICD-10-CM | POA: Diagnosis present

## 2012-12-04 DIAGNOSIS — Z8739 Personal history of other diseases of the musculoskeletal system and connective tissue: Secondary | ICD-10-CM | POA: Insufficient documentation

## 2012-12-04 DIAGNOSIS — F17200 Nicotine dependence, unspecified, uncomplicated: Secondary | ICD-10-CM | POA: Insufficient documentation

## 2012-12-04 DIAGNOSIS — F29 Unspecified psychosis not due to a substance or known physiological condition: Secondary | ICD-10-CM

## 2012-12-04 DIAGNOSIS — Z7901 Long term (current) use of anticoagulants: Secondary | ICD-10-CM | POA: Insufficient documentation

## 2012-12-04 DIAGNOSIS — J449 Chronic obstructive pulmonary disease, unspecified: Secondary | ICD-10-CM | POA: Insufficient documentation

## 2012-12-04 DIAGNOSIS — G4700 Insomnia, unspecified: Secondary | ICD-10-CM | POA: Insufficient documentation

## 2012-12-04 DIAGNOSIS — G4730 Sleep apnea, unspecified: Secondary | ICD-10-CM | POA: Insufficient documentation

## 2012-12-04 LAB — CBC WITH DIFFERENTIAL/PLATELET
Basophils Relative: 0 % (ref 0–1)
Hemoglobin: 16.4 g/dL — ABNORMAL HIGH (ref 12.0–15.0)
Lymphocytes Relative: 27 % (ref 12–46)
Lymphs Abs: 4.8 10*3/uL — ABNORMAL HIGH (ref 0.7–4.0)
MCH: 32.5 pg (ref 26.0–34.0)
MCHC: 34 g/dL (ref 30.0–36.0)
Monocytes Relative: 7 % (ref 3–12)
Neutro Abs: 11.9 10*3/uL — ABNORMAL HIGH (ref 1.7–7.7)
Neutrophils Relative %: 66 % (ref 43–77)
RBC: 5.05 MIL/uL (ref 3.87–5.11)
WBC: 17.9 10*3/uL — ABNORMAL HIGH (ref 4.0–10.5)

## 2012-12-04 LAB — URINALYSIS, ROUTINE W REFLEX MICROSCOPIC
Leukocytes, UA: NEGATIVE
Nitrite: NEGATIVE
Protein, ur: >300 mg/dL — AB
Specific Gravity, Urine: 1.025 (ref 1.005–1.030)
pH: 7.5 (ref 5.0–8.0)

## 2012-12-04 LAB — COMPREHENSIVE METABOLIC PANEL
ALT: 20 U/L (ref 0–35)
BUN: 17 mg/dL (ref 6–23)
CO2: 24 mEq/L (ref 19–32)
Calcium: 9.8 mg/dL (ref 8.4–10.5)
Chloride: 98 mEq/L (ref 96–112)
Creatinine, Ser: 1.1 mg/dL (ref 0.50–1.10)
GFR calc Af Amer: 74 mL/min — ABNORMAL LOW (ref >90)
GFR calc non Af Amer: 64 mL/min — ABNORMAL LOW (ref >90)
Glucose, Bld: 146 mg/dL — ABNORMAL HIGH (ref 70–99)
Sodium: 137 mEq/L (ref 135–145)
Total Protein: 8 g/dL (ref 6.0–8.3)

## 2012-12-04 LAB — ACETAMINOPHEN LEVEL: Acetaminophen (Tylenol), Serum: <15 ug/mL (ref 10–30)

## 2012-12-04 LAB — RAPID URINE DRUG SCREEN, HOSP PERFORMED
Barbiturates: NOT DETECTED
Opiates: NOT DETECTED
Tetrahydrocannabinol: POSITIVE — AB

## 2012-12-04 LAB — URINE MICROSCOPIC-ADD ON

## 2012-12-04 LAB — POCT PREGNANCY, URINE: Preg Test, Ur: NEGATIVE

## 2012-12-04 LAB — PROTIME-INR: INR: 1.04 (ref 0.00–1.49)

## 2012-12-04 MED ORDER — LORAZEPAM 2 MG/ML IJ SOLN
2.0000 mg | Freq: Once | INTRAMUSCULAR | Status: AC
  Administered 2012-12-04: 2 mg via INTRAVENOUS
  Filled 2012-12-04: qty 1

## 2012-12-04 MED ORDER — DIPHENHYDRAMINE HCL 50 MG/ML IJ SOLN
50.0000 mg | Freq: Once | INTRAMUSCULAR | Status: AC
  Administered 2012-12-04: 50 mg via INTRAMUSCULAR

## 2012-12-04 MED ORDER — HALOPERIDOL LACTATE 5 MG/ML IJ SOLN
5.0000 mg | Freq: Once | INTRAMUSCULAR | Status: AC
  Administered 2012-12-04: 5 mg via INTRAMUSCULAR
  Filled 2012-12-04: qty 1

## 2012-12-04 MED ORDER — LORAZEPAM 2 MG/ML IJ SOLN
2.0000 mg | Freq: Once | INTRAMUSCULAR | Status: AC
  Administered 2012-12-04: 2 mg via INTRAVENOUS

## 2012-12-04 MED ORDER — HALOPERIDOL LACTATE 5 MG/ML IJ SOLN
5.0000 mg | Freq: Once | INTRAMUSCULAR | Status: AC
  Administered 2012-12-04: 5 mg via INTRAMUSCULAR

## 2012-12-04 NOTE — ED Notes (Signed)
Pt was picked up by [employer] due to family member calling and stating that pt is not making any sense. Pt is combative, loud and using repetitive words.

## 2012-12-04 NOTE — Patient Instructions (Signed)
Discharge Against Medical Advice I am signing this paper to show that I am leaving this hospital or health care center of my own free will. It is done against all medical advice. In doing so, I am releasing this hospital or health care center and the attending physicians from any and all claims that I may want to make. I understand that further care has been recommended. My condition may worsen. This could cause me further bodily injury, illness, or even death. I do know that the medical staff has fully explained to me the risk that I am taking in leaving against medical advice.

## 2012-12-04 NOTE — Progress Notes (Signed)
  Subjective:    Patient ID: Judith Rice, female    DOB: July 08, 1975, 37 y.o.   MRN: [id number]  HPI  Patient presents today for questionable medical evaluation. However, patient's speech is extremely pressured and extremely tangential and I am unable to obtain a clear history from her today. Patient's boyfriend's mother is with her today. She states that she has been well off of her baseline for the past week with extremely pressured speech and abnormal behavior. Patient is known to have been recently discharged from [facility] for psychotic episodes. She was involuntarily committed at that time. The patient's boyfriend's mother has been caring for her since she was discharged from the hospital feels like she is much worse than which was when she left [facility].   Review of Systems  All other systems reviewed and are negative.       Objective:   Physical Exam  Constitutional:  Obese, pressured speech , minimally to mildly coherent  HENT:  Head: Normocephalic and atraumatic.  Eyes: Conjunctivae are normal. Pupils are equal, round, and reactive to light.  Neck: Normal range of motion.  Cardiovascular: Normal rate and regular rhythm.   Abdominal: Soft.  Musculoskeletal: Normal range of motion.  Psychiatric: Her affect is labile. Her speech is tangential. She is agitated. Cognition and memory are impaired. She expresses impulsivity.          Assessment & Plan:  Psychotic episode  Symptoms seem most consistent with a psychotic versus manic episode. No homicidal or suicidal ideations however patient's speech is too tangential to fully evaluate. Home caregiver as well as myself feel the  patient is well off of her baseline and at this point would greatly benefit from formal psychiatric reevaluation. Patient is agreeable to go via EMS transfer as long as law enforcement is not involved.  Greater than 50% of 60+  minutes was spent with patient in terms of direct patient  care and care coordination  Clinical update: Initially patient accepted/agreed to EMS transfer to hospital. Upon arrival by EMS patient adamantly refused transport and walked out. A protracted discussion entailed in the parking lot with patient, EMS, local law enforcement. Patient eventually signed AMA paperwork. Patient was unable to be transported for involuntary commitment/psych evaluation as patient was not an active threat to herself or others. Caregiver with her we'll try to talk her into going to the hospital for further evaluation.

## 2012-12-04 NOTE — ED Provider Notes (Signed)
CSN: [id number]     Arrival date & time 12/04/12  2155 History  This chart was scribed for Joya Gaskins, MD by Bennett Scrape, ED Scribe. This patient was seen in room APA18/APA18 and the patient's care was started at 9:50 PM.   Chief Complaint  Patient presents with  . V70.1   Level 5 Caveat-AMS  The history is provided by the police. No language interpreter was used.    HPI Comments: Judith Rice is a 37 y.o. female who presents to the Emergency Department in police custody for AMS described as loud, combative and using repetitive words. Per police, pt has been fighting with boyfriend for the past week. Boyfriend's mother took the pt to [facility] today out of concern but she was sent home. Police were called to her home for AMS. She cuffed and brought her here from home. IVC papers have been filed, but there is no Therapist, occupational ruling yet. She has been rambling in incoherent tangents since arriving to the ED. Due to pt's current condition, she is unable to answer any further questions.  Past Medical History  Diagnosis Date  . Nerve damage   . Sleep apnea   . COPD (chronic obstructive pulmonary disease)   . Asthma   . Hypoglycemia associated with diabetes   . Insomnia   . Gallstones   . Social anxiety disorder   . Cancer   . DVT (deep venous thrombosis)     LLE, 04/2012, Xarelto anticoagulation initiated  . Depression   . Suicidal ideations   . Baker's cyst     left    Past Surgical History  Procedure Laterality Date  . Abdominal hysterectomy    . Dilation and curettage of uterus     History reviewed. No pertinent family history. History  Substance Use Topics  . Smoking status: Current Every Day Smoker -- 1.00 packs/day  . Smokeless tobacco: Not on file     Comment: 1-2 packs per day, depending on nerves   . Alcohol Use: Yes     Comment: occ   No OB history provided.  Review of Systems  Unable to perform ROS: Mental status change    Allergies  Aspartame and  phenylalanine; Bee venom; Clindamycin/lincomycin; and Slo-bid gyrocaps  Home Medications   Current Outpatient Rx  Name  Route  Sig  Dispense  Refill  . albuterol-ipratropium (COMBIVENT) 18-103 MCG/ACT inhaler   Inhalation   Inhale 2 puffs into the lungs every 6 (six) hours as needed for wheezing.         . budesonide-formoterol (SYMBICORT) 80-4.5 MCG/ACT inhaler   Inhalation   Inhale 2 puffs into the lungs 2 (two) times daily.   1 Inhaler   6   . clonazePAM (KLONOPIN) 1 MG tablet   Oral   Take 1 tablet (1 mg total) by mouth at bedtime as needed for anxiety.   10 tablet   0   . cyclobenzaprine (FLEXERIL) 10 MG tablet   Oral   Take 10 mg by mouth 2 (two) times daily as needed. Muscle Spasms         . Echinacea 400 MG CAPS   Oral   Take 400 mg by mouth daily.         Marland Kitchen EPINEPHrine (EPIPEN 2-PAK) 0.3 mg/0.3 mL DEVI   Intramuscular   Inject 0.3 mg into the muscle Once PRN.         . fluticasone (FLONASE) 50 MCG/ACT nasal spray   Nasal  Place 1 spray into the nose at bedtime.          . gabapentin (NEURONTIN) 300 MG capsule   Oral   Take 300 mg by mouth 3 (three) times daily.         . hydrOXYzine (ATARAX/VISTARIL) 50 MG tablet   Oral   Take 1 tablet (50 mg total) by mouth 3 (three) times daily as needed for itching.   90 tablet   0   . naproxen (NAPROSYN) 500 MG tablet   Oral   Take 500 mg by mouth 2 (two) times daily with a meal.         . omeprazole (PRILOSEC) 20 MG capsule   Oral   Take 20 mg by mouth daily.         . pravastatin (PRAVACHOL) 40 MG tablet   Oral   Take 1.5 tablets (60 mg total) by mouth daily.   200 tablet   3   . predniSONE (DELTASONE) 50 MG tablet      1 tab daily x 7 days, then 1/2 tab x 5 days   12 tablet   0   . Rivaroxaban (XARELTO) 20 MG TABS   Oral   Take 20 mg by mouth daily.         . SUPER B COMPLEX & C TABS   Oral   Take 1 tablet by mouth daily.         . traZODone (DESYREL) 150 MG tablet    Oral   Take 300 mg by mouth at bedtime.         . triamterene-hydrochlorothiazide (DYAZIDE) 37.5-25 MG per capsule   Oral   Take 2 each (2 capsules total) by mouth every morning.   60 capsule   2   . venlafaxine XR (EFFEXOR-XR) 37.5 MG 24 hr capsule   Oral   Take 37.5 mg by mouth daily.         . vitamin C (ASCORBIC ACID) 500 MG tablet   Oral   Take 500 mg by mouth daily.          BP 127/79  Pulse 88  Temp(Src) 99.4 F (37.4 C) (Rectal)  Resp 18  SpO2 91%  Physical Exam  Nursing note and vitals reviewed.  CONSTITUTIONAL: disheveled, agitated, diaphoretic. Pt in handcuffs with police at bedside. HEAD: Normocephalic/atraumatic EYES: EOMI/PERRL, pupils are dilated bilaterally ENMT: Mucous membranes moist NECK: supple no meningeal signs SPINE:entire spine nontender CV: S1/S2 noted, no murmurs/rubs/gallops noted LUNGS: Lungs are clear to auscultation bilaterally, no apparent distress ABDOMEN: soft, nontender, no rebound or guarding, she is obese GU:no cva tenderness NEURO: Pt is awake/alert, moves all extremitiesx4 EXTREMITIES: pulses normal, full ROM SKIN: warm, color normal PSYCH: anxious, agitated, pressured speech  ED Course  Procedures  CRITICAL CARE Performed by: Joya Gaskins Total critical care time: 33 Critical care time was exclusive of separately billable procedures and treating other patients. Critical care was necessary to treat or prevent imminent or life-threatening deterioration. Critical care was time spent personally by me on the following activities: development of treatment plan with patient and/or surrogate as well as nursing, discussions with consultants, evaluation of patient's response to treatment, examination of patient, obtaining history from patient or surrogate, ordering and performing treatments and interventions, ordering and review of laboratory studies, ordering and review of radiographic studies, pulse oximetry and  re-evaluation of patient's condition.   COORDINATION OF CARE: 10:05 PM-Benadryl injection and Haldol injection given.  12:19 AM Pt seen on  arrival, arrived in handcuffs, yelling/agitated.  I reviewed records.  She was seen earlier on 12/04/12 for concern for psychosis.  Pt has h/o psych admission previously and has been under IVC before.  Apparently this has been present for past week.  I suspect this is a psychotic episode.  No fever here, though did have fever earlier at PCP prior to leaving.  Clinically this is is psychosis and unlikely acute meningitis/encephalitis.  Her pulse was 90%, but she is very agitated and required multiple doses of meds for sedation.  Also, restraints have been ordered and IVC paperwork has been completed Pt now more lucid, but pressured speech and tangential.  She does report recent cough.  Will obtain CXR  12:20 AM Pt now resting comfortably Mild hypoxia on room air Oxygen ordered ?edema on CXR, will order BNP EKG shows mildly prolonged QT, but less than 500 Will need to hold further antipsychotics for now At signout, f/u labs.    Labs Review Labs Reviewed  COMPREHENSIVE METABOLIC PANEL - Abnormal; Notable for the following:    Potassium 3.2 (*)    Glucose, Bld 146 (*)    GFR calc non Af Amer 64 (*)    GFR calc Af Amer 74 (*)    All other components within normal limits  CBC WITH DIFFERENTIAL - Abnormal; Notable for the following:    WBC 17.9 (*)    Hemoglobin 16.4 (*)    HCT 48.3 (*)    Neutro Abs 11.9 (*)    Lymphs Abs 4.8 (*)    Monocytes Absolute 1.2 (*)    All other components within normal limits  URINALYSIS, ROUTINE W REFLEX MICROSCOPIC - Abnormal; Notable for the following:    Color, Urine AMBER (*)    APPearance CLOUDY (*)    Glucose, UA 100 (*)    Hgb urine dipstick TRACE (*)    Bilirubin Urine SMALL (*)    Ketones, ur TRACE (*)    Protein, ur >300 (*)    All other components within normal limits  SALICYLATE LEVEL - Abnormal;  Notable for the following:    Salicylate Lvl 0.0 (*)    All other components within normal limits  URINE RAPID DRUG SCREEN (HOSP PERFORMED) - Abnormal; Notable for the following:    Benzodiazepines POSITIVE (*)    Tetrahydrocannabinol POSITIVE (*)    All other components within normal limits  URINE MICROSCOPIC-ADD ON - Abnormal; Notable for the following:    Bacteria, UA FEW (*)    Casts HYALINE CASTS (*)    All other components within normal limits  URINE CULTURE  PROTIME-INR  ACETAMINOPHEN LEVEL  ETHANOL  PRO B NATRIURETIC PEPTIDE  POCT PREGNANCY, URINE   Imaging Review Dg Chest Portable 1 View  12/05/2012   CLINICAL DATA:  Altered mental status. History of smoking and asthma. Involuntary commitment.  EXAM: PORTABLE CHEST - 1 VIEW  COMPARISON:  Chest radiograph and CTA of the chest performed 06/08/2012  FINDINGS: The lungs are well-aerated. Vascular congestion is noted, with increased interstitial markings, raising concern for mild pulmonary edema. There is no evidence of pleural effusion or pneumothorax.  The cardiomediastinal silhouette is borderline normal in size. No acute osseous abnormalities are seen.  IMPRESSION: Vascular congestion, with increased interstitial markings, raising concern for mild pulmonary edema.   Electronically Signed   By: Roanna Raider M.D.   On: 12/05/2012 00:14    EKG Interpretation   None       MDM  No diagnosis found.  Nursing notes including past medical history and social history reviewed and considered in documentation Labs/vital reviewed and considered Previous records reviewed and considered - recent PCP visit reviewed xrays reviewed and considered   I personally performed the services described in this documentation, which was scribed in my presence. The recorded information has been reviewed and is accurate.      Joya Gaskins, MD 12/05/12 [phone]

## 2012-12-05 LAB — PRO B NATRIURETIC PEPTIDE: Pro B Natriuretic peptide (BNP): 13.5 pg/mL (ref 0–125)

## 2012-12-05 MED ORDER — FUROSEMIDE 40 MG PO TABS
40.0000 mg | ORAL_TABLET | Freq: Once | ORAL | Status: AC
  Administered 2012-12-05: 40 mg via ORAL
  Filled 2012-12-05: qty 1

## 2012-12-05 MED ORDER — NICOTINE 21 MG/24HR TD PT24
21.0000 mg | MEDICATED_PATCH | Freq: Once | TRANSDERMAL | Status: AC
  Administered 2012-12-05 – 2012-12-06 (×2): 21 mg via TRANSDERMAL
  Filled 2012-12-05: qty 1

## 2012-12-05 MED ORDER — SIMVASTATIN 10 MG PO TABS
5.0000 mg | ORAL_TABLET | Freq: Every day | ORAL | Status: DC
  Administered 2012-12-05 – 2012-12-06 (×2): 5 mg via ORAL
  Administered 2012-12-07: 19:00:00 via ORAL
  Administered 2012-12-08 – 2012-12-11 (×4): 5 mg via ORAL
  Filled 2012-12-05 (×5): qty 0.5
  Filled 2012-12-05: qty 1
  Filled 2012-12-05 (×2): qty 0.5
  Filled 2012-12-05: qty 1
  Filled 2012-12-05: qty 0.5

## 2012-12-05 MED ORDER — IPRATROPIUM-ALBUTEROL 18-103 MCG/ACT IN AERO
2.0000 | INHALATION_SPRAY | Freq: Four times a day (QID) | RESPIRATORY_TRACT | Status: DC | PRN
  Administered 2012-12-06 – 2012-12-11 (×7): 2 via RESPIRATORY_TRACT
  Filled 2012-12-05: qty 14.7

## 2012-12-05 MED ORDER — CLONAZEPAM 0.5 MG PO TABS
1.0000 mg | ORAL_TABLET | Freq: Every evening | ORAL | Status: DC | PRN
  Administered 2012-12-05 – 2012-12-11 (×5): 1 mg via ORAL
  Filled 2012-12-05 (×7): qty 2

## 2012-12-05 MED ORDER — VENLAFAXINE HCL ER 37.5 MG PO CP24
37.5000 mg | ORAL_CAPSULE | Freq: Every day | ORAL | Status: DC
  Administered 2012-12-05 – 2012-12-12 (×8): 37.5 mg via ORAL
  Filled 2012-12-05 (×13): qty 1

## 2012-12-05 MED ORDER — BUDESONIDE-FORMOTEROL FUMARATE 80-4.5 MCG/ACT IN AERO
2.0000 | INHALATION_SPRAY | Freq: Two times a day (BID) | RESPIRATORY_TRACT | Status: DC
  Administered 2012-12-05 – 2012-12-12 (×14): 2 via RESPIRATORY_TRACT
  Filled 2012-12-05: qty 6.9

## 2012-12-05 MED ORDER — POTASSIUM CHLORIDE CRYS ER 20 MEQ PO TBCR
40.0000 meq | EXTENDED_RELEASE_TABLET | Freq: Once | ORAL | Status: AC
  Administered 2012-12-05: 40 meq via ORAL
  Filled 2012-12-05: qty 2

## 2012-12-05 MED ORDER — TRIAMTERENE-HCTZ 37.5-25 MG PO TABS
2.0000 | ORAL_TABLET | Freq: Every day | ORAL | Status: DC
  Administered 2012-12-05 – 2012-12-12 (×8): 2 via ORAL
  Filled 2012-12-05 (×13): qty 2

## 2012-12-05 MED ORDER — RIVAROXABAN 10 MG PO TABS
20.0000 mg | ORAL_TABLET | Freq: Every day | ORAL | Status: DC
  Administered 2012-12-05 – 2012-12-11 (×8): 20 mg via ORAL
  Filled 2012-12-05 (×2): qty 2
  Filled 2012-12-05: qty 1
  Filled 2012-12-05 (×4): qty 2
  Filled 2012-12-05: qty 1
  Filled 2012-12-05: qty 2
  Filled 2012-12-05: qty 1
  Filled 2012-12-05 (×2): qty 2

## 2012-12-05 MED ORDER — TRIAMTERENE-HCTZ 37.5-25 MG PO CAPS
2.0000 | ORAL_CAPSULE | Freq: Every day | ORAL | Status: DC
  Filled 2012-12-05 (×2): qty 2

## 2012-12-05 MED ORDER — FUROSEMIDE 10 MG/ML IJ SOLN: 40.0000 mg | INTRAMUSCULAR | Status: DC

## 2012-12-05 NOTE — Progress Notes (Signed)
Contacted the following facilities to seek placement for pt. :  [facility]- Per Misty Stanley, beds available, can fax referral [facility]- per Lillia Abed, beds available,can fax referral [facility]- per Arline Asp, beds available, can fax referral [facility]- Per Albin Felling, beds available, can fax referral  Writer will send referrals to the facilities listed above.   [facility]- Per Lewie Chamber, at [employer] [employer], at capacity [facility]- Per [place], at capacity [facility]- No answer [facility]- Per [place], at capacity [facility]- Per [facility], not female beds availible.  Rodman Pickle, MHT

## 2012-12-05 NOTE — ED Notes (Addendum)
Patient awake and requesting food. Gave patient crackers and peanut butter with drink. Also gave patient meal tray. Patient agitated and requesting for "the officer to take me home."

## 2012-12-05 NOTE — ED Notes (Signed)
Patient hollering and told me "get out of my room. I don't want to see you. I want to see my officer."

## 2012-12-05 NOTE — ED Notes (Signed)
Officer agreed to removal of 2 restraints. Removed left wrist and left ankle.

## 2012-12-05 NOTE — ED Notes (Signed)
Patient took one potassium pill and spit the second one out stating "I don't need this."

## 2012-12-05 NOTE — ED Provider Notes (Signed)
The pt has been denies at [facility] will attempt admission to [facility] or other center with medical bed - pt has been stable all night long - she has no c/o and has been resting after getting haldol and ativan and benadryl last PM  Vida Roller, MD 12/05/12 [phone]

## 2012-12-05 NOTE — ED Notes (Signed)
Patient repeatedly hollering for "my officer". Talked to patient and got her calmed down. Patient agreed to cooperate and let us obtain x-ray and EKG.

## 2012-12-05 NOTE — ED Notes (Signed)
Patient awake and receiving telepsych at this time. Officer remains at bedside.

## 2012-12-05 NOTE — ED Notes (Signed)
Patient ambulated to bathroom with steady gait; escorted by [employer].

## 2012-12-05 NOTE — ED Notes (Signed)
Discussed removal of restraints due to patient being asleep. Officer at bedside advised that patient must remain shackled while he is here with patient. Officer states he has no handcuffs large enough for patient so patient must remain on soft restraints from hospital.

## 2012-12-05 NOTE — ED Notes (Signed)
Pt on phone talking to family. Officer in room with pt.

## 2012-12-05 NOTE — ED Notes (Signed)
Patient lying flat in bed sleeping at this time. O2 saturation 84% on room air. Placed patient on nasal canula 4L O2. O2 saturation increased to 89%. Moved patient to another bed and raised head of bed. O2 saturation increased to 91%.

## 2012-12-05 NOTE — ED Notes (Signed)
Patient ambulatory to bathroom with steady gait.
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
## 2012-12-05 NOTE — ED Notes (Signed)
Pt at nurses desk asking something about family and her medical condition. Pt has thought and works so jumbled up it is hard to follow what she is talking about.

## 2012-12-06 LAB — URINALYSIS, ROUTINE W REFLEX MICROSCOPIC
Bilirubin Urine: NEGATIVE
Hgb urine dipstick: NEGATIVE
Leukocytes, UA: NEGATIVE
Nitrite: NEGATIVE
Protein, ur: NEGATIVE mg/dL
Specific Gravity, Urine: 1.01 (ref 1.005–1.030)
Urobilinogen, UA: 0.2 mg/dL (ref 0.0–1.0)

## 2012-12-06 LAB — CBC
HCT: 45.1 % (ref 36.0–46.0)
Platelets: 196 10*3/uL (ref 150–400)
RDW: 14.8 % (ref 11.5–15.5)
WBC: 12.2 10*3/uL — ABNORMAL HIGH (ref 4.0–10.5)

## 2012-12-06 LAB — URINE CULTURE

## 2012-12-06 MED ORDER — POTASSIUM CHLORIDE CRYS ER 20 MEQ PO TBCR
40.0000 meq | EXTENDED_RELEASE_TABLET | Freq: Three times a day (TID) | ORAL | Status: AC
  Administered 2012-12-06 – 2012-12-07 (×5): 40 meq via ORAL
  Filled 2012-12-06 (×5): qty 2

## 2012-12-06 MED ORDER — NICOTINE 21 MG/24HR TD PT24
MEDICATED_PATCH | TRANSDERMAL | Status: AC
  Administered 2012-12-06: 21 mg via TRANSDERMAL
  Filled 2012-12-06: qty 1

## 2012-12-06 MED ORDER — NICOTINE 21 MG/24HR TD PT24
MEDICATED_PATCH | TRANSDERMAL | Status: AC
  Filled 2012-12-06: qty 1

## 2012-12-06 MED ORDER — CLONAZEPAM 0.5 MG PO TABS
1.0000 mg | ORAL_TABLET | Freq: Two times a day (BID) | ORAL | Status: DC | PRN
  Administered 2012-12-06 – 2012-12-12 (×8): 1 mg via ORAL
  Filled 2012-12-06 (×8): qty 2

## 2012-12-06 MED ORDER — DIVALPROEX SODIUM ER 500 MG PO TB24
500.0000 mg | ORAL_TABLET | Freq: Every day | ORAL | Status: DC
  Administered 2012-12-06 – 2012-12-11 (×6): 500 mg via ORAL
  Filled 2012-12-06 (×6): qty 1

## 2012-12-06 MED ORDER — DIVALPROEX SODIUM ER 500 MG PO TB24
500.0000 mg | ORAL_TABLET | Freq: Every day | ORAL | Status: AC
  Administered 2012-12-06: 500 mg via ORAL
  Filled 2012-12-06: qty 1

## 2012-12-06 MED ORDER — NICOTINE 21 MG/24HR TD PT24
21.0000 mg | MEDICATED_PATCH | Freq: Once | TRANSDERMAL | Status: AC
  Administered 2012-12-06: 21 mg via TRANSDERMAL

## 2012-12-06 MED ORDER — DIVALPROEX SODIUM ER 250 MG PO TB24
250.0000 mg | ORAL_TABLET | Freq: Every morning | ORAL | Status: DC
  Administered 2012-12-07 – 2012-12-12 (×6): 250 mg via ORAL
  Filled 2012-12-06 (×11): qty 1

## 2012-12-06 NOTE — ED Notes (Signed)
Pt continues to exhibit manic behavior.  Pt remains cooperative.

## 2012-12-06 NOTE — ED Notes (Signed)
Patient to bathroom 

## 2012-12-06 NOTE — Progress Notes (Signed)
Contacted the following facilities to follow up on referrals that were sent:  [facility]- Per [facility], pt is on waitlist [facility]- Per Harriett Sine, will check records and call back  [facility]- Per Olegario Messier, pt has been denied as she is not medically stable [facility]- Per Natoki, Pt has been declined due to her acuity  Rodman Pickle, MHT

## 2012-12-06 NOTE — ED Notes (Signed)
Returned call to Ruth from [facility]. No answer.Voicemail left with callback number. 

## 2012-12-06 NOTE — ED Notes (Signed)
Called Windell Moulding back from [place] and give update on pt status.

## 2012-12-06 NOTE — ED Provider Notes (Signed)
Verne Spurr, PA, called. She was asked for medication recommendations because pt has some underlying agitation. She states she has a prolonged QTI so can't start antipsychotics.  States to start patient on depakote ER 500 mg now and then 500 mg at bedtime including tonight, then tomorrow 250 mg in the am's. She also states she can have klonopin 1 mg TID prn agitation.   CBC repeated and her leukocytosis is improving. She was started on potassium 40 meq for 5 doses, can have repeat K in am.   CBC    Component Value Date/Time   WBC 12.2* 12/06/2012 0832   RBC 4.66 12/06/2012 0832   HGB 15.1* 12/06/2012 0832   HCT 45.1 12/06/2012 0832   PLT 196 12/06/2012 0832   MCV 96.8 12/06/2012 0832   MCH 32.4 12/06/2012 0832   MCHC 33.5 12/06/2012 0832   RDW 14.8 12/06/2012 0832   LYMPHSABS 4.8* 12/04/2012 2219   MONOABS 1.2* 12/04/2012 2219   EOSABS 0.1 12/04/2012 2219   BASOSABS 0.0 12/04/2012 2219     Devoria Albe, MD, Franz Dell, MD 12/06/12 1445

## 2012-12-06 NOTE — ED Notes (Signed)
Pt mother called x2. Pt mother informed of pt status per pt permission. Pt mother informed of hospital policy concerning visitors and phone calls. Pt mother verbalized understanding and reported pt fiance and pt fiance's mother wants to come see pt.

## 2012-12-06 NOTE — ED Provider Notes (Signed)
7:52 AM-Brought to the ED for manic behavior.  Pt states she is feeling "wonderful" this morning.  She is ambulatory to the bathroom.  She is mildly agitated and seems to have trouble expressing what is wrong.  She has very tangential thinking and cannot answer a specific question.  She states as long as she has a female Technical sales engineer she will be okay.   Reviewed CXR which was read as possible failure, however she is morbidly obese and unable to take a deep breath.  BNP blood test was 31, which is well within normal and I think CXR findings were due to body habitus.  Pt declined at [facility] because of her CXR reading and her elevated WBC which is not surprising with her manic behavior. Will repeat WBC. Her potassium is also a little low.   Will consult psych about possible medication changes to keep her from getting more agitated while waiting for placement. Pt is under IVC.    I personally performed the services described in this documentation, which was scribed in my presence. The recorded information has been reviewed and considered.  Devoria Albe, MD, Armando Gang   Ward Givens, MD 12/06/12 [phone]

## 2012-12-06 NOTE — ED Notes (Signed)
Pt visitors left contact information and asked for update on pt status. Pt give permission to discuss plan of care with mother,fiance,fiance's mom. Mother's contact info is as follows: Myriam Jacobson [phone]. Pt fiance, John, contact info: [phone]. Pt fiances' mother, Aurther Loft, contact info: [phone].

## 2012-12-06 NOTE — BHH Counselor (Signed)
Late entry Writer asked Verne Spurr PA-C to do a telepsych in order to make med recommendations for patient. Tori TTS had requested that writer ask Lloyd Huger re: making med recs.  Evette Cristal, [place] Assessment Counselor

## 2012-12-06 NOTE — ED Notes (Signed)
Per [employer] officer sitting with pt. Pt not allowed to have any visitors at this time. Consulting civil engineer and Registration aware.

## 2012-12-07 LAB — CBC
MCH: 32.7 pg (ref 26.0–34.0)
MCV: 96.9 fL (ref 78.0–100.0)
Platelets: 208 10*3/uL (ref 150–400)
RBC: 4.86 MIL/uL (ref 3.87–5.11)
RDW: 14.7 % (ref 11.5–15.5)
WBC: 14.6 10*3/uL — ABNORMAL HIGH (ref 4.0–10.5)

## 2012-12-07 LAB — BASIC METABOLIC PANEL
CO2: 28 mEq/L (ref 19–32)
Chloride: 98 mEq/L (ref 96–112)
GFR calc Af Amer: >90 mL/min (ref >90)
Potassium: 4.1 mEq/L (ref 3.5–5.1)
Sodium: 137 mEq/L (ref 135–145)

## 2012-12-07 MED ORDER — ACETAMINOPHEN 325 MG PO TABS
ORAL_TABLET | ORAL | Status: AC
  Administered 2012-12-07: 650 mg via ORAL
  Filled 2012-12-07: qty 2

## 2012-12-07 MED ORDER — PROMETHAZINE HCL 12.5 MG PO TABS
25.0000 mg | ORAL_TABLET | Freq: Four times a day (QID) | ORAL | Status: DC | PRN
  Administered 2012-12-07 – 2012-12-11 (×2): 25 mg via ORAL
  Filled 2012-12-07 (×3): qty 2

## 2012-12-07 MED ORDER — ACETAMINOPHEN 325 MG PO TABS
650.0000 mg | ORAL_TABLET | Freq: Four times a day (QID) | ORAL | Status: DC | PRN
  Administered 2012-12-07 – 2012-12-12 (×9): 650 mg via ORAL
  Filled 2012-12-07 (×9): qty 2

## 2012-12-07 MED ORDER — PANTOPRAZOLE SODIUM 40 MG PO TBEC
40.0000 mg | DELAYED_RELEASE_TABLET | Freq: Once | ORAL | Status: AC
  Administered 2012-12-07: 40 mg via ORAL
  Filled 2012-12-07: qty 1

## 2012-12-07 MED ORDER — PROMETHAZINE HCL 12.5 MG PO TABS
ORAL_TABLET | ORAL | Status: AC
  Filled 2012-12-07: qty 1

## 2012-12-07 MED ORDER — PROMETHAZINE HCL 12.5 MG PO TABS
25.0000 mg | ORAL_TABLET | Freq: Once | ORAL | Status: AC
  Administered 2012-12-07: 25 mg via ORAL
  Filled 2012-12-07: qty 2

## 2012-12-07 MED ORDER — PROMETHAZINE HCL 25 MG/ML IJ SOLN
25.0000 mg | Freq: Once | INTRAMUSCULAR | Status: AC
  Administered 2012-12-07: 25 mg via INTRAMUSCULAR
  Filled 2012-12-07: qty 1

## 2012-12-07 NOTE — ED Notes (Signed)
Pt called RN in room and wanting transport to [facility]; pt informed she is unable to have transportation and must stay at our facility until she is accepted at another facility

## 2012-12-07 NOTE — BH Assessment (Signed)
BHH Assessment Progress Note The following facilities were contacted for possible placement:  [facility] - beds per [facility] @ [phone], referral faxed for review -[facility] - fax referral per [facility] @ [phone], referral faxed for review [facility] Lan - beds per Darlene @ [phone], referral faxed for review [facility] - beds per Luc[facility] @ [phone], referral faxed for review -[facility] - beds per [facility] @ 0930, referral faxed for review  Raynelle Highland to follow up @ 1000.  Spoke with Aurea Graff, who stated referral never received.  Referral faxed again for review.

## 2012-12-07 NOTE — ED Notes (Signed)
Pt is now sleeping peacefully.  Equal bil chest rise and fall.  nad noted.  [employer] at bedside.

## 2012-12-07 NOTE — ED Notes (Signed)
Pt c/o headache.  meds given.  Manic phase noted with erratic thought process.  [employer] at bedside.

## 2012-12-07 NOTE — ED Notes (Signed)
Pt speaking with ACT team.

## 2012-12-07 NOTE — ED Notes (Signed)
Pt remains cooperative, but difficult to redirect.
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
## 2012-12-07 NOTE — ED Notes (Signed)
Pt c/o headache.  meds given.  

## 2012-12-07 NOTE — BH Assessment (Signed)
BHH Assessment Progress Note    Spoke with Dr. Adriana Simas; patient has been re-assessed and continues to need inpatient hospitalization.  [facility] will continue to follow-up and seek placement.   Shon Baton, MSW, LCSW

## 2012-12-07 NOTE — ED Notes (Signed)
Pt agitated, crying, stating she wants to "leave and go to her hospital in [facility]."  Comfort measures given.  Lights turned down.  [employer] at bedside.

## 2012-12-07 NOTE — BH Assessment (Signed)
BHH Assessment Progress Note      [facility] to follow up on referral; they did not have referral. Re-faxed the referral and awaiting call back.   [facility]; they also could not find the referral from this morning; re-faxed the referral as they have one female bed.  Contacted [facility]; patient was denied due to medical acuity.  Contacted [facility] and [facility]; there was no answer. Attempted several times.   Shon Baton, MSW, LCSW

## 2012-12-07 NOTE — ED Notes (Signed)
Pt's anxiety level appears decreased. Still exhibiting manic behavior and train of thought. Requesting that the lights be turned down and help using the bathroom "so I can try to sleep".

## 2012-12-07 NOTE — ED Notes (Signed)
Pt c/o mid sudden onset cp.  ecg completed and edp notified.

## 2012-12-07 NOTE — ED Notes (Signed)
Pt now awake, up to bedside commode.

## 2012-12-07 NOTE — ED Notes (Signed)
Pt requesting tylenol and phenergan for headache and nausea; informed patient that she could only have phenergan; phenergan given; pt requesting a urinary catheter, pt informed she could not have one at this time; [employer] deputy at bedside at this time

## 2012-12-07 NOTE — ED Provider Notes (Signed)
Asked to see patient regarding nausea. She states that she became upset this morning when she does she gets nauseated. She also states she has not had her omeprazole. He complains she vomited once. Zofran is contraindicated because of allergy. I did obtain an EKG. This was a normal QT interval 440. No injury noted. She was given Protonix by mouth. Was given IM Phenergan. Still awaiting acceptance for placement.  Roney Marion, MD 12/07/12 1020

## 2012-12-07 NOTE — ED Notes (Signed)
Pt vomited x 1 witnessed by RN.  edp notified.

## 2012-12-07 NOTE — ED Notes (Signed)
Pt agitated, requesting more tylenol, talking loudly in room.  Up to bathroom and had to be redirected back to room several times.  [employer] at bedside.

## 2012-12-08 ENCOUNTER — Emergency Department (HOSPITAL_COMMUNITY): Payer: MEDICAID

## 2012-12-08 LAB — GLUCOSE, CAPILLARY
Glucose-Capillary: 159 mg/dL — ABNORMAL HIGH (ref 70–99)
Glucose-Capillary: 100 mg/dL — ABNORMAL HIGH (ref 70–99)

## 2012-12-08 MED ORDER — STERILE WATER FOR INJECTION IJ SOLN
INTRAMUSCULAR | Status: AC
  Administered 2012-12-08: 2.1 mL
  Filled 2012-12-08: qty 10

## 2012-12-08 MED ORDER — NICOTINE 21 MG/24HR TD PT24
21.0000 mg | MEDICATED_PATCH | Freq: Once | TRANSDERMAL | Status: AC
  Administered 2012-12-08: 21 mg via TRANSDERMAL
  Filled 2012-12-08: qty 1

## 2012-12-08 MED ORDER — FLUTICASONE PROPIONATE 50 MCG/ACT NA SUSP
2.0000 | Freq: Every day | NASAL | Status: DC
  Administered 2012-12-08 – 2012-12-12 (×5): 2 via NASAL
  Filled 2012-12-08: qty 16

## 2012-12-08 MED ORDER — TRAZODONE HCL 50 MG PO TABS
ORAL_TABLET | ORAL | Status: AC
  Filled 2012-12-08: qty 2

## 2012-12-08 MED ORDER — SUMATRIPTAN SUCCINATE 6 MG/0.5ML ~~LOC~~ SOLN
6.0000 mg | Freq: Once | SUBCUTANEOUS | Status: AC
  Administered 2012-12-08: 6 mg via SUBCUTANEOUS
  Filled 2012-12-08: qty 0.5

## 2012-12-08 MED ORDER — ZIPRASIDONE MESYLATE 20 MG IM SOLR
20.0000 mg | Freq: Once | INTRAMUSCULAR | Status: AC
  Administered 2012-12-08: 20 mg via INTRAMUSCULAR
  Filled 2012-12-08: qty 20

## 2012-12-08 MED ORDER — PANTOPRAZOLE SODIUM 40 MG PO TBEC
40.0000 mg | DELAYED_RELEASE_TABLET | Freq: Once | ORAL | Status: AC
  Administered 2012-12-08: 40 mg via ORAL
  Filled 2012-12-08: qty 1

## 2012-12-08 NOTE — ED Notes (Signed)
TTS consult being performed again.

## 2012-12-08 NOTE — BH Assessment (Signed)
BHH Assessment Progress Note   LCSW spoke to RN, Asher Muir, to schedule tele reassessment.  Assessment scheduled for 8:15pm and completed at 8:26pm.  LCSW spoke with patient.  Patient denies SI, HI, and hallucinations.  Patient spoke with rapid speech, had multiple medical complaints, often interrupted LCSW, and struggled to answer LCSW's questions.  Patient complained about medications, being shackled to the bed, having a warrant for her arrest, and was preoccupied with giving LCSW names and numbers for her family members.  When asked what brought her to the ED, patient replied the death of her favorite sister-in-law's baby in the womb.  Patient repeatedly asked that LCSW get her medications such as antibiotics, Immitrex injections, and Geodone, to which LCSW replied multiple times that she had no control of this as she is no an Charity fundraiser, nor doctor.  Patient would state that she understood that but would still ask for the medications.  Patient states this woman miscarried today.  Based on patient's presenting problems and continued manic behaviors inpatient psychiatric hospitalization is recommended.  LCSW has spoken to patient's attending physician, Dr. Clarene [facility], who agrees with recommendation.  Patient's information has been sent to [facility].  Patient has been declined by [facility] and [facility] due to acuity.   Tessa Lerner, LCSW, MSW 8:50 PM 12/08/2012

## 2012-12-08 NOTE — ED Notes (Signed)
Handcuff removed from pt. Pt calm and cooperative at present. Aware of need to remain so in order to prevent reapplication of handcuff.

## 2012-12-08 NOTE — ED Notes (Signed)
Pt sitting on bedside commode with hand shackled to bed. Refusing to get off bedside commode until sheriff's officer is removed from the hospital and she is allowed to leave. Pt yelling and cursing at staff.

## 2012-12-08 NOTE — BH Assessment (Addendum)
BHH Assessment Progress Note Pt declined by [facility] per Cassie @ 1525 due to acuity.  Called to follow up with [facility] @ 1526 and no answer.

## 2012-12-08 NOTE — ED Notes (Signed)
Waiting for Flonase from pharmacy.

## 2012-12-08 NOTE — ED Notes (Signed)
Pt back in bed. Pt threatening to hit the code blue button on the wall if she is not allowed to leave the facility. Pt's bed removed from the wall to place button out of arms reach. Sheriff's officer remains at bedside.

## 2012-12-08 NOTE — ED Notes (Signed)
PT requested shower. Shower prepared, doors secured, shampoo, towels, and clean gown offered. Patient was accompanied to shower by [facility] RN. Officer stood outside shower while patient showered. Toothbrush, toothpaste & deodorant collected to give patient once she returns from shower.

## 2012-12-09 ENCOUNTER — Encounter (HOSPITAL_COMMUNITY): Payer: Self-pay | Admitting: Psychiatry

## 2012-12-09 DIAGNOSIS — F319 Bipolar disorder, unspecified: Secondary | ICD-10-CM | POA: Diagnosis present

## 2012-12-09 DIAGNOSIS — F3110 Bipolar disorder, current episode manic without psychotic features, unspecified: Secondary | ICD-10-CM

## 2012-12-09 LAB — GLUCOSE, CAPILLARY
Glucose-Capillary: 98 mg/dL (ref 70–99)
Glucose-Capillary: 187 mg/dL — ABNORMAL HIGH (ref 70–99)

## 2012-12-09 MED ORDER — HALOPERIDOL LACTATE 5 MG/ML IJ SOLN
5.0000 mg | Freq: Four times a day (QID) | INTRAMUSCULAR | Status: DC | PRN
  Administered 2012-12-09: 5 mg via INTRAMUSCULAR
  Filled 2012-12-09: qty 1

## 2012-12-09 MED ORDER — ZOLPIDEM TARTRATE 5 MG PO TABS
10.0000 mg | ORAL_TABLET | Freq: Every evening | ORAL | Status: DC | PRN
  Administered 2012-12-09 – 2012-12-10 (×3): 10 mg via ORAL
  Filled 2012-12-09 (×4): qty 2

## 2012-12-09 MED ORDER — NICOTINE 21 MG/24HR TD PT24
21.0000 mg | MEDICATED_PATCH | Freq: Once | TRANSDERMAL | Status: AC
  Administered 2012-12-09: 21 mg via TRANSDERMAL
  Filled 2012-12-09: qty 1

## 2012-12-09 MED ORDER — DIVALPROEX SODIUM 250 MG PO DR TAB
DELAYED_RELEASE_TABLET | ORAL | Status: AC
  Filled 2012-12-09: qty 1

## 2012-12-09 MED ORDER — LORAZEPAM 2 MG/ML IJ SOLN
INTRAMUSCULAR | Status: AC
  Filled 2012-12-09: qty 1

## 2012-12-09 MED ORDER — LORAZEPAM 2 MG/ML IJ SOLN
1.0000 mg | Freq: Once | INTRAMUSCULAR | Status: AC
  Administered 2012-12-09: 1 mg via INTRAMUSCULAR

## 2012-12-09 MED ORDER — ZIPRASIDONE HCL 20 MG PO CAPS
20.0000 mg | ORAL_CAPSULE | Freq: Once | ORAL | Status: AC
  Administered 2012-12-09: 20 mg via ORAL
  Filled 2012-12-09: qty 1

## 2012-12-09 MED ORDER — ZIPRASIDONE HCL 20 MG PO CAPS
ORAL_CAPSULE | ORAL | Status: AC
  Filled 2012-12-09: qty 1

## 2012-12-09 MED ORDER — SUMATRIPTAN SUCCINATE 6 MG/0.5ML ~~LOC~~ SOLN
6.0000 mg | SUBCUTANEOUS | Status: AC | PRN
  Administered 2012-12-09 – 2012-12-12 (×2): 6 mg via SUBCUTANEOUS
  Filled 2012-12-09 (×2): qty 0.5

## 2012-12-09 MED ORDER — CLONAZEPAM 0.5 MG PO TABS
ORAL_TABLET | ORAL | Status: AC
  Filled 2012-12-09: qty 1

## 2012-12-09 MED ORDER — HALOPERIDOL LACTATE 5 MG/ML IJ SOLN
10.0000 mg | Freq: Once | INTRAMUSCULAR | Status: AC
  Administered 2012-12-09: 10 mg via INTRAMUSCULAR
  Filled 2012-12-09: qty 2

## 2012-12-09 NOTE — ED Notes (Signed)
Patient resting with eyes close. Respirations even and unlabored. Officer at bedside.

## 2012-12-09 NOTE — ED Notes (Addendum)
Left ankle is WNL.  CMS intact.  Able to move around in room

## 2012-12-09 NOTE — ED Provider Notes (Signed)
Pt in no distress, ambulatory Apparently will have repeat telepsych today  Joya Gaskins, MD 12/09/12 1046

## 2012-12-09 NOTE — ED Notes (Signed)
Mother Myriam Jacobson) [phone].

## 2012-12-09 NOTE — ED Notes (Signed)
Patient appears to be more calm at this time. Has stopped yelling loudly to nursing staff and officer. Patient is no longer in cuffs and shackles.

## 2012-12-09 NOTE — BH Assessment (Signed)
[facility] Assessment Progress Note Pt's tele psych scheduled for 1030 per EDP Wickline.  Johnnette Gourd at [facility] @ [phone] and she will tell pt's nurse.  Also, called [facility] and per [facility] @ [phone], pt's paperwork not received.  However, per Marcelino Duster, MHT, at 0510 per Amor at [facility], faxed referral was received.  TTS to follow up.

## 2012-12-09 NOTE — ED Notes (Signed)
BH called with an appointment time of 1030 for pt needing tele-psych.

## 2012-12-09 NOTE — ED Notes (Signed)
Patient woke up and ambulated to restroom. Patient agitated and talking loudly to police officer. Patient refused to exit bathroom. States she wanted officer to leave. When patient was assisted back to bed, patient then getting in and out of bed and opening door to leave room. Patient cursing and yelling at officer and yelling for nursing staff. Patient pushing emergency call button on wall continuously. Checked on patient, patient stated she was demanding that officer leave. Patient cursing. Dr. Hyacinth Meeker notified. Stated placing order for geodon po.

## 2012-12-09 NOTE — ED Notes (Signed)
Patient c/o headache, med given.  Patient still very restless, irritated.  Calm patient down and patient is trying to take a nap.  Sheriff Community education officer at the bedside

## 2012-12-09 NOTE — ED Notes (Signed)
Gave combivent @1115  per patient request.

## 2012-12-09 NOTE — ED Notes (Addendum)
Patient is calm and resting/sleeping.  Respiratory intact

## 2012-12-09 NOTE — ED Notes (Signed)
Patient woke up and has continued to get increasingly agitated. Patient walking out of room and talking to sheriff loudly. Patient also complaining of headache and asking for imitrex injection. Dr. Hyacinth Meeker notified stated was placing order for 10 mg of ambien and 6 mg of imitrex subcutaneous.

## 2012-12-09 NOTE — ED Notes (Signed)
Dr. Hyacinth Meeker notified that patient has not calmed down and continues to yell and is cursing at staff and officer. Dr. Hyacinth Meeker stated to give 1 mg of Ativan IM. Order placed.

## 2012-12-09 NOTE — ED Notes (Signed)
Patient is on the phone with her mother-in-law, Judith [name]

## 2012-12-09 NOTE — ED Provider Notes (Addendum)
Late entry: Patient screaming and belligerent.  Haldol ordered for patient and staff safety. No prolonged QT on recent EKG.  Glynn Octave, MD 12/09/12 1914  Glynn Octave, MD 12/09/12 [phone]

## 2012-12-09 NOTE — ED Notes (Signed)
Patient is screaming loudly for signing herself AMA.

## 2012-12-09 NOTE — ED Notes (Signed)
Patient agitated, demanding of nurse and staff. Agitated with officer that is at bedside. Rapid pressured speech. Patient does take medication with some resistance. Demanding phone, RN stated that patient could not use phone at this time due to agitation. Telepsych scheduled for 1030. Patient aware.

## 2012-12-09 NOTE — ED Notes (Signed)
Patient resting. Will obtain vitals when awake.

## 2012-12-09 NOTE — ED Notes (Signed)
Lunch given.

## 2012-12-09 NOTE — ED Notes (Signed)
Left ankle is WNL.  CMS intact.  Able to move around in room 
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
## 2012-12-10 LAB — CBC WITH DIFFERENTIAL/PLATELET
Basophils Absolute: 0 10*3/uL (ref 0.0–0.1)
Eosinophils Absolute: 0.1 10*3/uL (ref 0.0–0.7)
Eosinophils Relative: 1 % (ref 0–5)
Lymphs Abs: 3.5 10*3/uL (ref 0.7–4.0)
MCH: 32.6 pg (ref 26.0–34.0)
MCV: 97 fL (ref 78.0–100.0)
Neutro Abs: 6.8 10*3/uL (ref 1.7–7.7)
Neutrophils Relative %: 60 % (ref 43–77)
Platelets: 198 10*3/uL (ref 150–400)
RBC: 4.7 MIL/uL (ref 3.87–5.11)
RDW: 14.4 % (ref 11.5–15.5)
WBC: 11.3 10*3/uL — ABNORMAL HIGH (ref 4.0–10.5)

## 2012-12-10 LAB — BASIC METABOLIC PANEL
CO2: 30 mEq/L (ref 19–32)
Calcium: 9.7 mg/dL (ref 8.4–10.5)
Chloride: 98 mEq/L (ref 96–112)
GFR calc Af Amer: >90 mL/min (ref >90)
GFR calc non Af Amer: >90 mL/min (ref >90)
Potassium: 3.3 mEq/L — ABNORMAL LOW (ref 3.5–5.1)
Sodium: 138 mEq/L (ref 135–145)

## 2012-12-10 LAB — GLUCOSE, CAPILLARY: Glucose-Capillary: 110 mg/dL — ABNORMAL HIGH (ref 70–99)

## 2012-12-10 NOTE — Progress Notes (Signed)
Underwriter confirmed with [facility] pt on wait list.  Per Erskine Squibb, refaxed referral for waitlist.  IVC paperwork faxed to [facility] who confirmed on waitlist at 332 on 12/10/12.  Blain Pais, MHT/NS

## 2012-12-10 NOTE — Progress Notes (Signed)
B.Jawuan Robb,MHT received report from [place] at [facility] that patient is not on wait list and has requested additional testing; CBC, EKG, and BMP writer informed attending nurse Gearldine Bienenstock at [facility] of request.

## 2012-12-10 NOTE — ED Notes (Signed)
Spoke with Judith Rice with [facility], states that [facility] needed a repeat EKG and lab CBC and BMP

## 2012-12-11 LAB — GLUCOSE, CAPILLARY: Glucose-Capillary: 113 mg/dL — ABNORMAL HIGH (ref 70–99)

## 2012-12-11 MED ORDER — PANTOPRAZOLE SODIUM 40 MG PO TBEC
40.0000 mg | DELAYED_RELEASE_TABLET | Freq: Every day | ORAL | Status: DC
  Administered 2012-12-11 – 2012-12-12 (×2): 40 mg via ORAL
  Filled 2012-12-11 (×2): qty 1

## 2012-12-11 MED ORDER — NICOTINE 21 MG/24HR TD PT24
MEDICATED_PATCH | TRANSDERMAL | Status: AC
  Administered 2012-12-11: 21 mg
  Filled 2012-12-11: qty 1

## 2012-12-11 MED ORDER — ZOLPIDEM TARTRATE 5 MG PO TABS
5.0000 mg | ORAL_TABLET | Freq: Every evening | ORAL | Status: DC | PRN
  Administered 2012-12-11: 5 mg via ORAL
  Filled 2012-12-11: qty 1
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
## 2012-12-11 NOTE — ED Notes (Signed)
Pt is currently in the shower, accompanied by Nurse Tech.

## 2012-12-11 NOTE — ED Notes (Signed)
Pt given breakfast tray. Asking to use phone. Explained phone policy. Pt stated she was aware.

## 2012-12-11 NOTE — BH Assessment (Signed)
Consulted with EDP Dr.Pollina  Who reports that Dr.Cook  requested a TTS consult.   Glorious Peach, MS, LCASA Assessment Counselor

## 2012-12-11 NOTE — ED Notes (Signed)
Patient states that she needs to have a medical power of attorney so her mother can look after her medically. Patient states that she has to do this in order to go home. Patient requesting information about whether her psych re-evaluation will allow her to go home.

## 2012-12-11 NOTE — ED Notes (Signed)
Pt had first 5 min. Phone call of the day.

## 2012-12-11 NOTE — Progress Notes (Signed)
Judith Rice, MHT received voice mail from Renee Pain, RN at [facility] at 4:38 pm today notifying that patient has been accepted for wait list.

## 2012-12-11 NOTE — ED Notes (Signed)
Patient on phone calling family. Tearful at this time because she states that they are going to send here away and she wants to come home.

## 2012-12-11 NOTE — BH Assessment (Signed)
Informed EDP Dr. Blinda Leatherwood that inpatient treatment is still  Recommended at this time for psychiatric stabilization. Informed pt's nurse Darl Pikes that patient is still needing inpatient treatment and is currently pending bed placement at this time.   Glorious Peach, MS, LCASA Assessment Counselor
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
## 2012-12-11 NOTE — ED Notes (Signed)
Pt has been much calmer and coherent today than when she first arrived. . Was reported the same of yesterday also. PT is requesting  Reevaluation for discharge home, stating she feels much better. Spoke with EDP about this and additional TTS eval is to be ordered. Pt requesting to shower. Explained that should be fine once we receive an appt time for TTS so the shower will not interfere with eval. Pt requested Klonopin earlier. She did not appear agitated but stated she would like to rest.

## 2012-12-11 NOTE — Progress Notes (Addendum)
[facility]: Faxed CBC, BMP & EKG will call to F/U to see if pt will be placed on waiting list. @1510  Spoke with Robinette paperwork was received but has to be reviewed by the Dr to determine if Pt would be placed on waiting list.  Deatra James, MHT

## 2012-12-12 NOTE — ED Notes (Signed)
Called patient's mother at [phone], Helon Postlethwait, at patient's request to let her know that the patient is fine and is going to sleep. Mother states that the patient is extremely upset because tomorrow is the anniversary of her husbands death.
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
## 2012-12-12 NOTE — BH Assessment (Cosign Needed)
Patient evaluated by Psychiatrist/Medical Director- Dr. Lucianne Muss by way of the telepsych. Writer was present during this assessment. Patient denies SI, HI, and AVH's. She is cooperative but somewhat tearful. Her insight and judgement appears to be appropriate. Her mood and affect is appropriate. She answers all questions appropriately. She reports living with her boyfriend's family and sts that she has their support. Per Dr. Lucianne Muss patient is to be discharged home with appropriate follow up. She also recommends that patient's IVC is rescinded.  Patient agrees to this plan and is elated to be discharged from the ED today. Sts that her family will pick her up.  Patient sts that she receives outpatient mental health services with [facility] [place] is not aware of a "[facility]" located in [place]. Writer wanted to insure that patient has the appropriate follow up services to maintain her medication management and therapy as needed. Writer contacted [facility] by calling the provider line [phone]. Writer spoke to [place] whom confirmed that their is a Transport planner in [place]. Staff also confirmed that this patient's last appointment with [facility] was November 23, 2012. Patient has a current upcoming appointment January 04, 2013 @ 10am with Vesta Mixer. Staff suggested since patient is being discharged from the ED that patient's appointment should be moved up to the following place, date, and time (See below).   Discharge Plan:  Follow up with [facility] [address] [place], [place] [address] [phone]  December 13, 2012 @ 9:30am  Writer communicated the above information with EDP-Dr. Adriana Simas and patient's nurse Tiffany. Patient's nurse will also assist EDP-Dr. Adriana Simas in rescinding the IVC prior to patient's discharge.

## 2012-12-12 NOTE — ED Notes (Signed)
Pt request to make a phone call. Pt allowed call for 15 min

## 2012-12-12 NOTE — ED Notes (Signed)
Telepsych in progress. 

## 2012-12-12 NOTE — ED Provider Notes (Signed)
Repeat tele-psychiatry evaluation. No suicidal or homicidal ideation. No psychosis. IVC papers rescinded. Patient will followup with Judith Rice tomorrow at 9:30 AM.  Donnetta Hutching, MD 12/12/12 [phone]

## 2012-12-12 NOTE — ED Notes (Signed)
Pt is c/o of headache at this time.

## 2012-12-12 NOTE — ED Notes (Signed)
Patient given discharge instruction, verbalized understand. Patient ambulatory out of the department to waiting area for ride. Pt advised to follow up with [facility] Lor[facility] Leriche tomorrow.

## 2012-12-17 ENCOUNTER — Telehealth: Payer: Self-pay | Admitting: *Deleted

## 2012-12-17 ENCOUNTER — Encounter: Payer: Self-pay | Admitting: Vascular Surgery

## 2012-12-17 NOTE — Telephone Encounter (Signed)
Patient requesting nebulizer.  She would like to see [place] to follow-up on her COPD only.

## 2012-12-18 ENCOUNTER — Ambulatory Visit: Payer: Medicaid Other | Admitting: Family Medicine

## 2012-12-18 ENCOUNTER — Encounter: Payer: Self-pay | Admitting: Vascular Surgery

## 2012-12-18 ENCOUNTER — Encounter (HOSPITAL_COMMUNITY): Payer: MEDICAID

## 2012-12-24 ENCOUNTER — Other Ambulatory Visit: Payer: Self-pay | Admitting: Family Medicine

## 2012-12-24 ENCOUNTER — Ambulatory Visit: Payer: Medicaid Other | Admitting: Family Medicine

## 2012-12-24 DIAGNOSIS — J449 Chronic obstructive pulmonary disease, unspecified: Secondary | ICD-10-CM

## 2012-12-24 MED ORDER — IPRATROPIUM-ALBUTEROL 0.5-2.5 (3) MG/3ML IN SOLN: 3.0000 mL | Freq: Four times a day (QID) | RESPIRATORY_TRACT | Status: DC | PRN

## 2012-12-24 NOTE — Telephone Encounter (Signed)
Called patient but she was taking a nap. Spoke with her friend. Judith Rice had a nebulizer before moving to this area and feels that she would benefit from one again. Discussed with Dr. Alvester Morin and we will send in a prescription.

## 2012-12-26 NOTE — Telephone Encounter (Signed)
Nebulizer and medication sent. Patient aware.

## 2013-01-02 ENCOUNTER — Telehealth: Payer: Self-pay | Admitting: Family Medicine

## 2013-01-04 ENCOUNTER — Encounter: Payer: Self-pay | Admitting: Cardiology

## 2013-01-04 ENCOUNTER — Ambulatory Visit (INDEPENDENT_AMBULATORY_CARE_PROVIDER_SITE_OTHER): Payer: Medicaid Other | Admitting: Cardiology

## 2013-01-04 VITALS — BP 118/80 | HR 87 | Ht 65.0 in | Wt 376.8 lb

## 2013-01-04 DIAGNOSIS — R609 Edema, unspecified: Secondary | ICD-10-CM

## 2013-01-04 DIAGNOSIS — R600 Localized edema: Secondary | ICD-10-CM

## 2013-01-04 MED ORDER — FUROSEMIDE 40 MG PO TABS: 40.0000 mg | ORAL_TABLET | ORAL | Status: DC | PRN

## 2013-01-04 NOTE — Patient Instructions (Signed)
Your physician recommends that you schedule a follow-up appointment in: as needed.  Your physician has recommended you make the following change in your medication:  Start: Furosemide (Lasix) 40 MG 1 tablet by mouth once daily as needed for swelling.  Continue all other medications the same.

## 2013-01-04 NOTE — Telephone Encounter (Signed)
I spoke with patient and offered her an appt with Alvester Morin on Tues and she said forget it that she will find her another Dr.

## 2013-01-04 NOTE — Progress Notes (Signed)
Clinical Summary [name] is a 37 y.o.female seen today as an add on for lower extremity edema.   1. Lower extremity edema - patient reports worsening bilateral LE edema over the last several weeks - prior cardiac workup has been negative including a recent echo with normal LV systolic and diastolic function and no significant valvular pathology. She has had a prior pro-BNP of 13 which is fairly specific for ruling out heart failure. Her edema is non-cardiac.  2. History of LE DVT - left sided DVT diagnosed at [facility] earlier this year. - CTA negative for PE - she has been managed on xarelto by another provider, we have not been managing this   Past Medical History  Diagnosis Date  . Nerve damage   . Sleep apnea   . COPD (chronic obstructive pulmonary disease)   . Asthma   . Hypoglycemia associated with diabetes   . Insomnia   . Gallstones   . Social anxiety disorder   . Cancer   . DVT (deep venous thrombosis)     LLE, 04/2012, Xarelto anticoagulation initiated  . Depression   . Suicidal ideations   . Baker's cyst     left      Allergies  Allergen Reactions  . Aspartame And Phenylalanine Anaphylaxis  . Bee Venom Anaphylaxis    Has EPIPEN  . Clindamycin/Lincomycin Other (See Comments)    REACTION: "puts me in the hospital", UNKNOWN  . Slo-Bid Gyrocaps [Theophylline] Rash and Other (See Comments)    REACTION: Hyperactivity      Current Outpatient Prescriptions  Medication Sig Dispense Refill  . albuterol-ipratropium (COMBIVENT) 18-103 MCG/ACT inhaler Inhale 2 puffs into the lungs every 6 (six) hours as needed for wheezing.      . budesonide-formoterol (SYMBICORT) 80-4.5 MCG/ACT inhaler Inhale 2 puffs into the lungs 2 (two) times daily.  1 Inhaler  6  . clonazePAM (KLONOPIN) 1 MG tablet Take 1 tablet (1 mg total) by mouth at bedtime as needed for anxiety.  10 tablet  0  . cyclobenzaprine (FLEXERIL) 10 MG tablet Take 10 mg by mouth 2 (two) times daily  as needed. Muscle Spasms      . diazepam (VALIUM) 5 MG tablet Take 5 mg by mouth every 8 (eight) hours as needed for anxiety.      . Echinacea 400 MG CAPS Take 400 mg by mouth daily.      Marland Kitchen EPINEPHrine (EPIPEN 2-PAK) 0.3 mg/0.3 mL DEVI Inject 0.3 mg into the muscle Once PRN.      . fluticasone (FLONASE) 50 MCG/ACT nasal spray Place 1 spray into the nose at bedtime.       . gabapentin (NEURONTIN) 300 MG capsule Take 300 mg by mouth 3 (three) times daily.      . hydrOXYzine (ATARAX/VISTARIL) 50 MG tablet Take 1 tablet (50 mg total) by mouth 3 (three) times daily as needed for itching.  90 tablet  0  . ipratropium-albuterol (DUONEB) 0.5-2.5 (3) MG/3ML SOLN Take 3 mLs by nebulization every 6 (six) hours as needed.  360 mL  1  . naproxen (NAPROSYN) 500 MG tablet Take 500 mg by mouth 2 (two) times daily with a meal.      . omeprazole (PRILOSEC) 20 MG capsule Take 20 mg by mouth daily.      . pravastatin (PRAVACHOL) 40 MG tablet Take 1.5 tablets (60 mg total) by mouth daily.  200 tablet  3  . Rivaroxaban (XARELTO) 20 MG TABS Take 20  mg by mouth daily.      . SUPER B COMPLEX & C TABS Take 1 tablet by mouth daily.      . traZODone (DESYREL) 150 MG tablet Take 300 mg by mouth at bedtime.      . triamterene-hydrochlorothiazide (DYAZIDE) 37.5-25 MG per capsule Take 2 each (2 capsules total) by mouth every morning.  60 capsule  2  . venlafaxine XR (EFFEXOR-XR) 37.5 MG 24 hr capsule Take 37.5 mg by mouth daily.       Current Facility-Administered Medications  Medication Dose Route Frequency Provider Last Rate Last Dose  . methylPREDNISolone sodium succinate (SOLU-MEDROL) 130 mg in sodium chloride 0.9 % 50 mL IVPB  130 mg Intramuscular Once Doree Albee, MD         Past Surgical History  Procedure Laterality Date  . Abdominal hysterectomy    . Dilation and curettage of uterus       Allergies  Allergen Reactions  . Aspartame And Phenylalanine Anaphylaxis  . Bee Venom Anaphylaxis    Has EPIPEN  .  Clindamycin/Lincomycin Other (See Comments)    REACTION: "puts me in the hospital", UNKNOWN  . Slo-Bid Gyrocaps [Theophylline] Rash and Other (See Comments)    REACTION: Hyperactivity       No family history on file.   Social History [name] reports that she has been smoking.  She does not have any smokeless tobacco history on file. [name] reports that she drinks alcohol.   Review of Systems CONSTITUTIONAL: No weight loss, fever, chills, weakness or fatigue.  HEENT: Eyes: No visual loss, blurred vision, double vision or yellow sclerae.No hearing loss, sneezing, congestion, runny nose or sore throat.  SKIN: No rash or itching.  CARDIOVASCULAR: no chest pain, no palpitations, orthopnea, PND RESPIRATORY: No shortness of breath, cough or sputum.  GASTROINTESTINAL: No anorexia, nausea, vomiting or diarrhea. No abdominal pain or blood.  GENITOURINARY: No burning on urination, no polyuria NEUROLOGICAL: No headache, dizziness, syncope, paralysis, ataxia, numbness or tingling in the extremities. No change in bowel or bladder control.  MUSCULOSKELETAL: No muscle, back pain, joint pain or stiffness.  LYMPHATICS: No enlarged nodes. No history of splenectomy.  PSYCHIATRIC: No history of depression or anxiety.  ENDOCRINOLOGIC: No reports of sweating, cold or heat intolerance. No polyuria or polydipsia.  Marland Kitchen   Physical Examination p 87 bp 118/80 Wt 376 BMI 63 Gen: resting comfortably, no acute distress HEENT: no scleral icterus, pupils equal round and reactive, no palptable cervical adenopathy,  CV: RRR, no m/r/g, no JVD, no carotid bruits Resp: Clear to auscultation bilaterally GI: abdomen is soft, non-tender, non-distended, normal bowel sounds, no hepatosplenomegaly MSK: extremities are warm, 3+ bilateral edema  Skin: warm, no rash Neuro:  no focal deficits   Diagnostic Studies 06/2012 Echo LVEF 55-60%, no WMAs, normal diastolic function, normal right ventricle, normal IVC, PASP  23 mmHg.   Pertinent labs 11/2012: Na 138 K 3.3 BUN 11 Cr 0.80 Hgb 15.3 Hct 45.6 Plt 198 UA: large protein UDS: + Benzos + THC  ProBNP: 13 11/2012 CXR: mild pulm edema  10/2012 TSH: 1.9 Assessment and Plan  1. Lower extremity edema - non-cardiac, could be related to her severe obesity. Her last UA did show significant proteinuria, would suggest considering working up for possible nephrotic syndrome - provided her with prescription for prn lasix to take in addition to her daily dyazide - she has no cardiac conditions at this time, further management per her PCP. May reconsult if new cardiac issues  arise.       Antoine Poche, M.D., F.A.C.C.

## 2013-01-16 ENCOUNTER — Encounter: Payer: Self-pay | Admitting: Surgery

## 2013-01-21 ENCOUNTER — Encounter: Payer: Self-pay | Admitting: Surgery

## 2013-01-21 ENCOUNTER — Inpatient Hospital Stay (HOSPITAL_COMMUNITY): Admission: RE | Admit: 2013-01-21 | Payer: Medicaid Other | Source: Ambulatory Visit

## 2013-03-03 ENCOUNTER — Encounter (HOSPITAL_COMMUNITY): Payer: Self-pay | Admitting: Emergency Medicine

## 2013-03-03 ENCOUNTER — Emergency Department (EMERGENCY_DEPARTMENT_HOSPITAL)
Admission: EM | Admit: 2013-03-03 | Discharge: 2013-03-08 | Disposition: A | Discharge status: Home / Self Care | Payer: MEDICAID | Source: Home / Self Care | Admitting: Emergency Medicine

## 2013-03-03 DIAGNOSIS — F29 Unspecified psychosis not due to a substance or known physiological condition: Secondary | ICD-10-CM

## 2013-03-03 DIAGNOSIS — Z86718 Personal history of other venous thrombosis and embolism: Secondary | ICD-10-CM

## 2013-03-03 DIAGNOSIS — F3160 Bipolar disorder, current episode mixed, unspecified: Principal | ICD-10-CM | POA: Diagnosis present

## 2013-03-03 DIAGNOSIS — I82409 Acute embolism and thrombosis of unspecified deep veins of unspecified lower extremity: Secondary | ICD-10-CM | POA: Insufficient documentation

## 2013-03-03 DIAGNOSIS — G4700 Insomnia, unspecified: Secondary | ICD-10-CM | POA: Insufficient documentation

## 2013-03-03 DIAGNOSIS — J45909 Unspecified asthma, uncomplicated: Secondary | ICD-10-CM

## 2013-03-03 DIAGNOSIS — R45851 Suicidal ideations: Secondary | ICD-10-CM | POA: Insufficient documentation

## 2013-03-03 DIAGNOSIS — F1920 Other psychoactive substance dependence, uncomplicated: Secondary | ICD-10-CM | POA: Diagnosis present

## 2013-03-03 DIAGNOSIS — I10 Essential (primary) hypertension: Secondary | ICD-10-CM | POA: Diagnosis present

## 2013-03-03 DIAGNOSIS — Z91199 Patient's noncompliance with other medical treatment and regimen due to unspecified reason: Secondary | ICD-10-CM

## 2013-03-03 DIAGNOSIS — M7120 Synovial cyst of popliteal space [Baker], unspecified knee: Secondary | ICD-10-CM

## 2013-03-03 DIAGNOSIS — F411 Generalized anxiety disorder: Secondary | ICD-10-CM | POA: Diagnosis present

## 2013-03-03 DIAGNOSIS — J4489 Other specified chronic obstructive pulmonary disease: Secondary | ICD-10-CM | POA: Diagnosis present

## 2013-03-03 DIAGNOSIS — Z79899 Other long term (current) drug therapy: Secondary | ICD-10-CM

## 2013-03-03 DIAGNOSIS — E1169 Type 2 diabetes mellitus with other specified complication: Secondary | ICD-10-CM | POA: Insufficient documentation

## 2013-03-03 DIAGNOSIS — Z9119 Patient's noncompliance with other medical treatment and regimen: Secondary | ICD-10-CM

## 2013-03-03 DIAGNOSIS — G4730 Sleep apnea, unspecified: Secondary | ICD-10-CM

## 2013-03-03 DIAGNOSIS — J449 Chronic obstructive pulmonary disease, unspecified: Secondary | ICD-10-CM | POA: Diagnosis present

## 2013-03-03 DIAGNOSIS — F603 Borderline personality disorder: Secondary | ICD-10-CM | POA: Diagnosis present

## 2013-03-03 DIAGNOSIS — F913 Oppositional defiant disorder: Secondary | ICD-10-CM | POA: Diagnosis present

## 2013-03-03 DIAGNOSIS — F319 Bipolar disorder, unspecified: Secondary | ICD-10-CM

## 2013-03-03 DIAGNOSIS — F3289 Other specified depressive episodes: Secondary | ICD-10-CM | POA: Insufficient documentation

## 2013-03-03 DIAGNOSIS — F4010 Social phobia, unspecified: Secondary | ICD-10-CM | POA: Insufficient documentation

## 2013-03-03 DIAGNOSIS — K8020 Calculus of gallbladder without cholecystitis without obstruction: Secondary | ICD-10-CM | POA: Insufficient documentation

## 2013-03-03 DIAGNOSIS — F1210 Cannabis abuse, uncomplicated: Secondary | ICD-10-CM | POA: Diagnosis present

## 2013-03-03 DIAGNOSIS — F329 Major depressive disorder, single episode, unspecified: Secondary | ICD-10-CM | POA: Insufficient documentation

## 2013-03-03 DIAGNOSIS — E119 Type 2 diabetes mellitus without complications: Secondary | ICD-10-CM | POA: Diagnosis present

## 2013-03-03 DIAGNOSIS — F17200 Nicotine dependence, unspecified, uncomplicated: Secondary | ICD-10-CM | POA: Diagnosis present

## 2013-03-03 DIAGNOSIS — F4310 Post-traumatic stress disorder, unspecified: Secondary | ICD-10-CM | POA: Diagnosis present

## 2013-03-03 LAB — CBC WITH DIFFERENTIAL/PLATELET
Basophils Absolute: 0 10*3/uL (ref 0.0–0.1)
Basophils Relative: 0 % (ref 0–1)
EOS ABS: 0.1 10*3/uL (ref 0.0–0.7)
EOS PCT: 0 % (ref 0–5)
HEMATOCRIT: 48.5 % — AB (ref 36.0–46.0)
HEMOGLOBIN: 16.5 g/dL — AB (ref 12.0–15.0)
LYMPHS ABS: 4.1 10*3/uL — AB (ref 0.7–4.0)
Lymphocytes Relative: 33 % (ref 12–46)
MCH: 32 pg (ref 26.0–34.0)
MCHC: 34 g/dL (ref 30.0–36.0)
MCV: 94.2 fL (ref 78.0–100.0)
MONOS PCT: 7 % (ref 3–12)
Monocytes Absolute: 0.9 10*3/uL (ref 0.1–1.0)
Neutro Abs: 7.5 10*3/uL (ref 1.7–7.7)
Neutrophils Relative %: 60 % (ref 43–77)
Platelets: 229 10*3/uL (ref 150–400)
RBC: 5.15 MIL/uL — ABNORMAL HIGH (ref 3.87–5.11)
RDW: 14.2 % (ref 11.5–15.5)
WBC: 12.5 10*3/uL — ABNORMAL HIGH (ref 4.0–10.5)

## 2013-03-03 LAB — BASIC METABOLIC PANEL
BUN: 13 mg/dL (ref 6–23)
CALCIUM: 9.7 mg/dL (ref 8.4–10.5)
CO2: 21 meq/L (ref 19–32)
Chloride: 101 mEq/L (ref 96–112)
Creatinine, Ser: 0.72 mg/dL (ref 0.50–1.10)
GFR calc Af Amer: >90 mL/min (ref >90)
GLUCOSE: 135 mg/dL — AB (ref 70–99)
Potassium: 3.7 mEq/L (ref 3.7–5.3)
Sodium: 138 mEq/L (ref 137–147)

## 2013-03-03 LAB — RAPID URINE DRUG SCREEN, HOSP PERFORMED
Amphetamines: NOT DETECTED
Barbiturates: NOT DETECTED
Benzodiazepines: NOT DETECTED
Cocaine: NOT DETECTED
Opiates: NOT DETECTED
TETRAHYDROCANNABINOL: POSITIVE — AB

## 2013-03-03 LAB — URINALYSIS, ROUTINE W REFLEX MICROSCOPIC
GLUCOSE, UA: NEGATIVE mg/dL
HGB URINE DIPSTICK: NEGATIVE
Leukocytes, UA: NEGATIVE
Nitrite: NEGATIVE
Protein, ur: NEGATIVE mg/dL
Urobilinogen, UA: 0.2 mg/dL (ref 0.0–1.0)
pH: 5.5 (ref 5.0–8.0)

## 2013-03-03 LAB — PREGNANCY, URINE: Preg Test, Ur: NEGATIVE

## 2013-03-03 LAB — ETHANOL

## 2013-03-03 MED ORDER — ZIPRASIDONE MESYLATE 20 MG IM SOLR
20.0000 mg | Freq: Once | INTRAMUSCULAR | Status: AC
  Administered 2013-03-03: 20 mg via INTRAMUSCULAR

## 2013-03-03 MED ORDER — BUDESONIDE-FORMOTEROL FUMARATE 80-4.5 MCG/ACT IN AERO
2.0000 | INHALATION_SPRAY | Freq: Two times a day (BID) | RESPIRATORY_TRACT | Status: DC
  Administered 2013-03-04 – 2013-03-07 (×9): 2 via RESPIRATORY_TRACT
  Filled 2013-03-03 (×2): qty 6.9

## 2013-03-03 MED ORDER — IBUPROFEN 200 MG PO TABS
600.0000 mg | ORAL_TABLET | Freq: Three times a day (TID) | ORAL | Status: DC | PRN
  Administered 2013-03-06 – 2013-03-08 (×3): 600 mg via ORAL
  Filled 2013-03-03 (×2): qty 2
  Filled 2013-03-03: qty 3

## 2013-03-03 MED ORDER — CLONAZEPAM 1 MG PO TABS
1.0000 mg | ORAL_TABLET | Freq: Every day | ORAL | Status: DC
  Administered 2013-03-04 – 2013-03-07 (×5): 1 mg via ORAL
  Filled 2013-03-03 (×5): qty 2

## 2013-03-03 MED ORDER — TRIAMTERENE-HCTZ 37.5-25 MG PO CAPS
2.0000 | ORAL_CAPSULE | Freq: Every day | ORAL | Status: DC
  Filled 2013-03-03 (×4): qty 2

## 2013-03-03 MED ORDER — IPRATROPIUM-ALBUTEROL 0.5-2.5 (3) MG/3ML IN SOLN
3.0000 mL | Freq: Four times a day (QID) | RESPIRATORY_TRACT | Status: DC | PRN
  Administered 2013-03-05 – 2013-03-07 (×5): 3 mL via RESPIRATORY_TRACT
  Filled 2013-03-03 (×6): qty 3

## 2013-03-03 MED ORDER — VENLAFAXINE HCL 75 MG PO TABS
75.0000 mg | ORAL_TABLET | Freq: Every day | ORAL | Status: DC
  Administered 2013-03-04 – 2013-03-06 (×3): 75 mg via ORAL
  Filled 2013-03-03 (×6): qty 1

## 2013-03-03 MED ORDER — PANTOPRAZOLE SODIUM 40 MG PO TBEC
40.0000 mg | DELAYED_RELEASE_TABLET | Freq: Every day | ORAL | Status: DC
  Administered 2013-03-04 – 2013-03-07 (×4): 40 mg via ORAL
  Filled 2013-03-03 (×3): qty 1

## 2013-03-03 MED ORDER — ZOLPIDEM TARTRATE 5 MG PO TABS
5.0000 mg | ORAL_TABLET | Freq: Every evening | ORAL | Status: DC | PRN
  Administered 2013-03-04 – 2013-03-07 (×3): 5 mg via ORAL
  Filled 2013-03-03 (×3): qty 1

## 2013-03-03 MED ORDER — LORAZEPAM 2 MG/ML IJ SOLN
2.0000 mg | Freq: Once | INTRAMUSCULAR | Status: AC
  Administered 2013-03-03: 2 mg via INTRAMUSCULAR

## 2013-03-03 MED ORDER — NICOTINE 21 MG/24HR TD PT24
21.0000 mg | MEDICATED_PATCH | Freq: Every day | TRANSDERMAL | Status: DC | PRN
  Administered 2013-03-05 – 2013-03-07 (×3): 21 mg via TRANSDERMAL
  Filled 2013-03-03 (×4): qty 1

## 2013-03-03 MED ORDER — LORAZEPAM 2 MG/ML IJ SOLN
INTRAMUSCULAR | Status: AC
  Administered 2013-03-03: 12:00:00 2 mg via INTRAMUSCULAR
  Filled 2013-03-03: qty 1

## 2013-03-03 MED ORDER — DIVALPROEX SODIUM 250 MG PO DR TAB
250.0000 mg | DELAYED_RELEASE_TABLET | Freq: Three times a day (TID) | ORAL | Status: DC
  Administered 2013-03-04 – 2013-03-06 (×8): 250 mg via ORAL
  Filled 2013-03-03 (×9): qty 1

## 2013-03-03 MED ORDER — ZIPRASIDONE MESYLATE 20 MG IM SOLR
INTRAMUSCULAR | Status: AC
  Administered 2013-03-03: 12:00:00 20 mg via INTRAMUSCULAR
  Filled 2013-03-03: qty 20

## 2013-03-03 MED ORDER — ONDANSETRON HCL 4 MG PO TABS
4.0000 mg | ORAL_TABLET | Freq: Three times a day (TID) | ORAL | Status: DC | PRN
  Administered 2013-03-06: 4 mg via ORAL
  Filled 2013-03-03: qty 1

## 2013-03-03 MED ORDER — BUDESONIDE-FORMOTEROL FUMARATE 80-4.5 MCG/ACT IN AERO
INHALATION_SPRAY | RESPIRATORY_TRACT | Status: AC
  Filled 2013-03-03: qty 6.9

## 2013-03-03 MED ORDER — ALUM & MAG HYDROXIDE-SIMETH 200-200-20 MG/5ML PO SUSP: 30.0000 mL | ORAL | Status: DC | PRN

## 2013-03-03 MED ORDER — STERILE WATER FOR INJECTION IJ SOLN
INTRAMUSCULAR | Status: AC
  Administered 2013-03-03: 12:00:00
  Filled 2013-03-03: qty 10

## 2013-03-03 MED ORDER — ACETAMINOPHEN 325 MG PO TABS: 650.0000 mg | ORAL_TABLET | ORAL | Status: DC | PRN

## 2013-03-03 MED ORDER — SIMVASTATIN 20 MG PO TABS
30.0000 mg | ORAL_TABLET | Freq: Every day | ORAL | Status: DC
  Administered 2013-03-04 – 2013-03-08 (×5): 30 mg via ORAL
  Filled 2013-03-03 (×8): qty 1.5

## 2013-03-03 MED ORDER — IPRATROPIUM-ALBUTEROL 18-103 MCG/ACT IN AERO: 2.0000 | INHALATION_SPRAY | Freq: Four times a day (QID) | RESPIRATORY_TRACT | Status: DC | PRN

## 2013-03-03 MED ORDER — RIVAROXABAN 20 MG PO TABS
20.0000 mg | ORAL_TABLET | Freq: Every day | ORAL | Status: DC
  Administered 2013-03-04 – 2013-03-08 (×5): 20 mg via ORAL
  Filled 2013-03-03 (×10): qty 1

## 2013-03-03 MED ORDER — FLUTICASONE PROPIONATE 50 MCG/ACT NA SUSP
1.0000 | Freq: Every day | NASAL | Status: DC
  Administered 2013-03-04 – 2013-03-07 (×4): 1 via NASAL
  Filled 2013-03-03 (×2): qty 16

## 2013-03-03 MED ORDER — FLUTICASONE PROPIONATE 50 MCG/ACT NA SUSP
NASAL | Status: AC
  Filled 2013-03-03: qty 16

## 2013-03-03 MED ORDER — HYDROXYZINE HCL 25 MG PO TABS
50.0000 mg | ORAL_TABLET | Freq: Three times a day (TID) | ORAL | Status: DC | PRN
  Administered 2013-03-08: 50 mg via ORAL
  Filled 2013-03-03: qty 2

## 2013-03-03 MED ORDER — GABAPENTIN 300 MG PO CAPS
ORAL_CAPSULE | ORAL | Status: AC
  Filled 2013-03-03: qty 2

## 2013-03-03 MED ORDER — GABAPENTIN 300 MG PO CAPS
300.0000 mg | ORAL_CAPSULE | Freq: Three times a day (TID) | ORAL | Status: DC
  Administered 2013-03-03 – 2013-03-08 (×14): 300 mg via ORAL
  Filled 2013-03-03 (×29): qty 1

## 2013-03-03 NOTE — BH Assessment (Signed)
[place] Assessment Progress Note Received call from [facility] requesting tele assessment for pt.  Called EDP Eulis Foster to get clinical for the pt @ 1314.  Called pt's nurse, Tiffany, @ [phone], who stated pt currently sedated from medication and she will call TTS back for tele assessment once the pt wakes.  TTS will assess her at that time.  Shaune Pascal, MS, [facility] Licensed Professional Counselor Triage Specialist
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
## 2013-03-03 NOTE — ED Notes (Signed)
MD at bedside. IVC paperwork in progress. Verbal order for 20 mg Geodon IM and Ativan 2 mg IM.

## 2013-03-03 NOTE — ED Notes (Signed)
Pt arrived to er by [place]  [facility] after bystanders contacted 911 who saw pt walking down the road with a big bag of belongings, pt rambles in triage, jumping from different subjects, agitated at triage, denies any SI, HI, auditory or visual hallucinations. States " the last connections is this hospital and need to be offered food and nourishment before love"

## 2013-03-03 NOTE — ED Notes (Signed)
Patient loud, agitated. Flight of ideas. MD aware.

## 2013-03-03 NOTE — ED Provider Notes (Signed)
CSN: [id number]     Arrival date & time 03/03/13  1114 History  This chart was scribed for Richarda Blade, MD by Roxan Diesel, ED scribe.  This patient was seen in room APA03/APA03 and the patient's care was started at 11:36 AM.   Chief Complaint  Patient presents with  . V70.1    The history is provided by the patient and the EMS personnel. History limited by: psychiatric disorder. No language interpreter was used.    HPI Comments: Judith Rice is a 38 y.o. female with bipolar disorder brought in by EMS to the Emergency Department for severe agitation and bizarre behavior.  Per [employer], bystanders saw pt walking down the road with a large bag of belongings ("waiting for them to come find me" in patient's word).  Bystander called the rescue squad who then called 911.  On arrival she is rambling loudly and is jumping between different subjects.  She also expresses some concerns that she is being followed.  She states she is not taking any medications currently.  She states she is supposed to take psychiatric medications but she is unsure of what they are.  She has been seen by a psychiatrist in [place] but states she is not seeing them currently.   Level 5 caveat- acute psychosis  Patient Active Problem List   Diagnosis Date Noted  . Lower extremity edema 01/04/2013  . Bipolar 1 disorder 12/09/2012  . DVT (deep venous thrombosis)     Past Medical History  Diagnosis Date  . Nerve damage   . Sleep apnea   . COPD (chronic obstructive pulmonary disease)   . Asthma   . Hypoglycemia associated with diabetes   . Insomnia   . Gallstones   . Social anxiety disorder   . Cancer   . DVT (deep venous thrombosis)     LLE, 04/2012, Xarelto anticoagulation initiated  . Depression   . Suicidal ideations   . Baker's cyst     left     Past Surgical History  Procedure Laterality Date  . Abdominal hysterectomy    . Dilation and curettage of uterus      No family  history on file.   History  Substance Use Topics  . Smoking status: Current Every Day Smoker -- 1.00 packs/day  . Smokeless tobacco: Not on file     Comment: 1-2 packs per day, depending on nerves   . Alcohol Use: Yes     Comment: occ    OB History   Grav Para Term Preterm Abortions TAB SAB Ect Mult Living                  Review of Systems  Unable to perform ROS: Psychiatric disorder     Allergies  Aspartame and phenylalanine; Bee venom; Clindamycin/lincomycin; and Slo-bid gyrocaps  Home Medications   Current Outpatient Rx  Name  Route  Sig  Dispense  Refill  . albuterol-ipratropium (COMBIVENT) 18-103 MCG/ACT inhaler   Inhalation   Inhale 2 puffs into the lungs every 6 (six) hours as needed for wheezing.         . budesonide-formoterol (SYMBICORT) 80-4.5 MCG/ACT inhaler   Inhalation   Inhale 2 puffs into the lungs 2 (two) times daily.   1 Inhaler   6   . clonazePAM (KLONOPIN) 1 MG tablet   Oral   Take 1 tablet (1 mg total) by mouth at bedtime as needed for anxiety.   10 tablet   0   .  cyclobenzaprine (FLEXERIL) 10 MG tablet   Oral   Take 10 mg by mouth 2 (two) times daily as needed. Muscle Spasms         . diazepam (VALIUM) 5 MG tablet   Oral   Take 5 mg by mouth every 8 (eight) hours as needed for anxiety.         . divalproex (DEPAKOTE) 250 MG DR tablet   Oral   Take 250 mg by mouth 3 (three) times daily.         . Echinacea 400 MG CAPS   Oral   Take 400 mg by mouth daily.         Marland Kitchen EPINEPHrine (EPIPEN 2-PAK) 0.3 mg/0.3 mL DEVI   Intramuscular   Inject 0.3 mg into the muscle Once PRN.         . fluticasone (FLONASE) 50 MCG/ACT nasal spray   Nasal   Place 1 spray into the nose at bedtime.          . furosemide (LASIX) 40 MG tablet   Oral   Take 1 tablet (40 mg total) by mouth as needed (Swelling).   30 tablet   11   . gabapentin (NEURONTIN) 300 MG capsule   Oral   Take 300 mg by mouth 3 (three) times daily.         Marland Kitchen  guaifenesin (HUMIBID E) 400 MG TABS tablet   Oral   Take 400 mg by mouth every 4 (four) hours.         . hydrOXYzine (ATARAX/VISTARIL) 50 MG tablet   Oral   Take 1 tablet (50 mg total) by mouth 3 (three) times daily as needed for itching.   90 tablet   0   . ipratropium-albuterol (DUONEB) 0.5-2.5 (3) MG/3ML SOLN   Nebulization   Take 3 mLs by nebulization every 6 (six) hours as needed.   360 mL   1   . naproxen (NAPROSYN) 500 MG tablet   Oral   Take 500 mg by mouth 2 (two) times daily with a meal.         . omeprazole (PRILOSEC) 20 MG capsule   Oral   Take 20 mg by mouth daily.         . pravastatin (PRAVACHOL) 40 MG tablet   Oral   Take 1.5 tablets (60 mg total) by mouth daily.   200 tablet   3   . Rivaroxaban (XARELTO) 20 MG TABS   Oral   Take 20 mg by mouth daily.         . SUPER B COMPLEX & C TABS   Oral   Take 1 tablet by mouth daily.         Marland Kitchen triamterene-hydrochlorothiazide (DYAZIDE) 37.5-25 MG per capsule   Oral   Take 2 each (2 capsules total) by mouth every morning.   60 capsule   2   . venlafaxine (EFFEXOR) 75 MG tablet   Oral   Take 75 mg by mouth daily.          BP 124/101  Pulse 110  Temp(Src) 97.6 F (36.4 C) (Oral)  Resp 20  SpO2 97%  Physical Exam  Nursing note and vitals reviewed. Constitutional: She appears well-developed.  Obese  HENT:  Head: Normocephalic and atraumatic.  Eyes: Conjunctivae and EOM are normal. Pupils are equal, round, and reactive to light.  Neck: Normal range of motion and phonation normal. Neck supple.  Cardiovascular:  Tachycardic  Pulmonary/Chest: Effort normal.  Musculoskeletal: Normal range of motion.  Neurological: She is alert. She exhibits normal muscle tone. Coordination normal.  Skin: Skin is warm and dry.  Psychiatric: She is agitated.  Agitated, confused, delusional, paranoid    ED Course  Procedures (including critical care time)  Patient Vitals for the past 24 hrs:  BP Temp  Temp src Pulse Resp SpO2  03/03/13 1126 124/101 mmHg 97.6 F (36.4 C) Oral 110 20 97 %    Medications  acetaminophen (TYLENOL) tablet 650 mg (not administered)  ibuprofen (ADVIL,MOTRIN) tablet 600 mg (not administered)  zolpidem (AMBIEN) tablet 5 mg (not administered)  nicotine (NICODERM CQ - dosed in mg/24 hours) patch 21 mg (not administered)  ondansetron (ZOFRAN) tablet 4 mg (not administered)  alum & mag hydroxide-simeth (MAALOX/MYLANTA) 200-200-20 MG/5ML suspension 30 mL (not administered)  albuterol-ipratropium (COMBIVENT) inhaler 2 puff (not administered)  budesonide-formoterol (SYMBICORT) 80-4.5 MCG/ACT inhaler 2 puff (not administered)  clonazePAM (KLONOPIN) tablet 1 mg (not administered)  divalproex (DEPAKOTE) DR tablet 250 mg (not administered)  fluticasone (FLONASE) 50 MCG/ACT nasal spray 1 spray (not administered)  gabapentin (NEURONTIN) capsule 300 mg (not administered)  hydrOXYzine (ATARAX/VISTARIL) tablet 50 mg (not administered)  ipratropium-albuterol (DUONEB) 0.5-2.5 (3) MG/3ML nebulizer solution 3 mL (not administered)  pantoprazole (PROTONIX) EC tablet 40 mg (not administered)  simvastatin (ZOCOR) tablet 40 mg (not administered)  Rivaroxaban (XARELTO) tablet 20 mg (not administered)  triamterene-hydrochlorothiazide (DYAZIDE) 37.5-25 MG per capsule 2 capsule (not administered)  venlafaxine (EFFEXOR) tablet 75 mg (not administered)  sterile water (preservative free) injection (  Rx Charged 03/03/13 1202)  ziprasidone (GEODON) injection 20 mg (20 mg Intramuscular Given 03/03/13 1150)  LORazepam (ATIVAN) injection 2 mg (2 mg Intramuscular Given 03/03/13 1150)    DIAGNOSTIC STUDIES: Oxygen Saturation is 97% on room air, normal by my interpretation.    COORDINATION OF CARE: 11:41 AM-Will treat with Ativan and Geodon.  14:00- she is sedated now, and sleeping. I discussed the case with TTS, who will consult with her when she awakens to help to arrange admission to  psychiatric hospital  Labs Review Labs Reviewed  CBC WITH DIFFERENTIAL - Abnormal; Notable for the following:    WBC 12.5 (*)    RBC 5.15 (*)    Hemoglobin 16.5 (*)    HCT 48.5 (*)    Lymphs Abs 4.1 (*)    All other components within normal limits  BASIC METABOLIC PANEL - Abnormal; Notable for the following:    Glucose, Bld 135 (*)    All other components within normal limits  ETHANOL  URINE RAPID DRUG SCREEN (HOSP PERFORMED)  URINALYSIS, ROUTINE W REFLEX MICROSCOPIC  PREGNANCY, URINE     MDM   1. Psychosis    Recurrent acute psychosis. I, suspect she's not taking her medication.  Nursing Notes Reviewed/ Care Coordinated, and agree without changes. Applicable Imaging Reviewed.  Interpretation of Laboratory Data incorporated into ED treatment  Care to oncoming provider team to evaluate, and disposition,  the patient in assistance with TTS    I personally performed the services described in this documentation, which was scribed in my presence. The recorded information has been reviewed and is accurate.       Richarda Blade, MD 03/03/13 [phone]

## 2013-03-03 NOTE — ED Notes (Addendum)
Patient awake. Threw lunch tray in floor. [employer] at bedside.

## 2013-03-03 NOTE — ED Notes (Signed)
Telepsych machine at bedside. Refusing medications at present. Agitated.
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
## 2013-03-03 NOTE — ED Notes (Signed)
Patient remains resting. Will medicate with daily medicine upon awakening.

## 2013-03-03 NOTE — ED Notes (Signed)
Patient requesting bathroom. Bedside commode brought to room Patient ambulated to [facility] without difficulty.

## 2013-03-03 NOTE — ED Notes (Signed)
[employer] at bedside until [employer] available to sit with patient for safety.

## 2013-03-03 NOTE — ED Notes (Signed)
Patient given lunch tray. Eating without difficulty.

## 2013-03-03 NOTE — ED Notes (Signed)
RN explained to patient telepsych process. Given water per request.

## 2013-03-03 NOTE — Progress Notes (Signed)
Pts referral has been faxed to the following beds with bed availability:  [facility] Disposition [facility]

## 2013-03-04 LAB — GLUCOSE, CAPILLARY: Glucose-Capillary: 95 mg/dL (ref 70–99)

## 2013-03-04 MED ORDER — LORAZEPAM 1 MG PO TABS: 2.0000 mg | ORAL_TABLET | Freq: Once | ORAL | Status: DC

## 2013-03-04 MED ORDER — ZIPRASIDONE MESYLATE 20 MG IM SOLR
20.0000 mg | Freq: Once | INTRAMUSCULAR | Status: AC
  Administered 2013-03-04: 20 mg via INTRAMUSCULAR
  Filled 2013-03-04: qty 20

## 2013-03-04 MED ORDER — STERILE WATER FOR INJECTION IJ SOLN
INTRAMUSCULAR | Status: AC
  Administered 2013-03-04: 1.2 mL
  Filled 2013-03-04: qty 10

## 2013-03-04 MED ORDER — LORAZEPAM 1 MG PO TABS
2.0000 mg | ORAL_TABLET | Freq: Once | ORAL | Status: DC
Start: 2013-03-04 — End: 2013-03-04
  Filled 2013-03-04: qty 2

## 2013-03-04 MED ORDER — STERILE WATER FOR INJECTION IJ SOLN
INTRAMUSCULAR | Status: AC
  Administered 2013-03-04: 15:00:00
  Filled 2013-03-04: qty 10

## 2013-03-04 MED ORDER — LORAZEPAM 1 MG PO TABS
2.0000 mg | ORAL_TABLET | Freq: Once | ORAL | Status: AC
  Administered 2013-03-04: 2 mg via ORAL
  Filled 2013-03-04: qty 2

## 2013-03-04 MED ORDER — TRIAMTERENE-HCTZ 37.5-25 MG PO TABS
2.0000 | ORAL_TABLET | Freq: Every day | ORAL | Status: DC
  Administered 2013-03-04 – 2013-03-07 (×4): 2 via ORAL
  Filled 2013-03-04 (×9): qty 2

## 2013-03-04 NOTE — ED Notes (Signed)
Patient asleep at this time.  Will give the simvastatin with the 2200 medications. Sheriff's department at bedside.

## 2013-03-04 NOTE — ED Notes (Signed)
Patient now sleeping. Holding one time order by Dr. Sabra Heck for ativan 2 mg PO.

## 2013-03-04 NOTE — ED Notes (Signed)
Patient agitated, yelling and cursing at officer and myself. Waiting to speak with MD about order for anxiety medication.

## 2013-03-04 NOTE — ED Notes (Signed)
Pt calmer now, sleeping intermittent. Pt has had left wrist in cuff due to pt coming out of room. Skin intact. nad

## 2013-03-04 NOTE — ED Notes (Signed)
Pt excessively talking again. hollaring and crying at times. EDP aware.

## 2013-03-04 NOTE — Progress Notes (Signed)
Pt denied at [facility], [place], [place], [place] denied for aggression

## 2013-03-05 MED ORDER — NICOTINE 21 MG/24HR TD PT24: 21.0000 mg | MEDICATED_PATCH | Freq: Once | TRANSDERMAL | Status: DC

## 2013-03-05 MED ORDER — LORAZEPAM 1 MG PO TABS
1.0000 mg | ORAL_TABLET | Freq: Once | ORAL | Status: AC
  Administered 2013-03-05: 1 mg via ORAL
  Filled 2013-03-05: qty 1

## 2013-03-05 MED ORDER — ZIPRASIDONE MESYLATE 20 MG IM SOLR
20.0000 mg | Freq: Once | INTRAMUSCULAR | Status: AC
  Administered 2013-03-05: 20 mg via INTRAMUSCULAR

## 2013-03-05 MED ORDER — ZIPRASIDONE MESYLATE 20 MG IM SOLR
INTRAMUSCULAR | Status: AC
  Filled 2013-03-05: qty 20

## 2013-03-05 MED ORDER — LORAZEPAM 1 MG PO TABS
2.0000 mg | ORAL_TABLET | Freq: Once | ORAL | Status: AC
  Administered 2013-03-05: 2 mg via ORAL
  Filled 2013-03-05: qty 2

## 2013-03-05 MED ORDER — STERILE WATER FOR INJECTION IJ SOLN
INTRAMUSCULAR | Status: AC
  Administered 2013-03-05: 07:00:00
  Filled 2013-03-05: qty 10

## 2013-03-05 MED ORDER — STERILE WATER FOR INJECTION IJ SOLN
INTRAMUSCULAR | Status: AC
  Administered 2013-03-06: 23:00:00
  Filled 2013-03-05: qty 10

## 2013-03-05 NOTE — ED Notes (Signed)
Pt given lunch tray, yelling out at times, [employer] remains at bedside,

## 2013-03-05 NOTE — ED Notes (Signed)
Pt ambulatory to restroom, [employer] remains at bedside, tele psych has been finished,

## 2013-03-05 NOTE — ED Provider Notes (Signed)
Patient has been intermittently hostile and agitated through the night. She was given Geodon with some improvement. Awaiting placement, will ask for a repeat psychiatry evaluation for possible medication adjustment/recommendations.  Filed Vitals:   03/04/13 2210  BP: 113/69  Pulse: 77  Temp: 97.8 F (36.6 C)  Resp: 18     Orpah Greek, MD 03/05/13 [phone]

## 2013-03-05 NOTE — ED Notes (Signed)
Tele psych machine moved to tx room, [employer] remains at bedside,

## 2013-03-05 NOTE — ED Provider Notes (Signed)
Patient becoming agitated and hostile with staff. We'll give IM Geodon.  Julianne Rice, MD 03/05/13 [phone]

## 2013-03-05 NOTE — ED Notes (Signed)
Pt continues to yell and talking in circles, Constant talking about multiple topics., Officer has ask pt to calm down and quite down multiple times. MD aware orders given.
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
## 2013-03-05 NOTE — ED Notes (Signed)
Lights dimmer, pt resting, cooperative at present, [facility] remains at bedside,

## 2013-03-05 NOTE — ED Notes (Signed)
Patient requesting drink, something to eat, and "something to help me sleep." States "I usually take [place] and xanax." Gave patient peanut butter and graham crackers, sprite, and to be given PRN ambien.

## 2013-03-05 NOTE — Progress Notes (Addendum)
Follow-up calls have been placed to the following:  [facility]- Per Randall Hiss currently at [facility]- declined [facility]- per [facility] only geropsych beds available [facility]- per Izora Gala at [facility] Disposition [facility]

## 2013-03-05 NOTE — ED Notes (Signed)
Pt yelling during telepsy

## 2013-03-05 NOTE — ED Notes (Addendum)
Pt cooperative, still rambles in religion statements, is able to speak with her mother via phone, Rn staff also spoke with pt's mother [name], who was updated on plan of care,

## 2013-03-05 NOTE — BH Assessment (Signed)
Patient scheduled for a TA at 1000. Writer contacted Dr. Betsey Holiday for clinical details prior to seeing this patient.

## 2013-03-05 NOTE — ED Notes (Signed)
Pt given breakfast tray, [facility] remains at bedside, pt calmer at present, eating breakfast,

## 2013-03-05 NOTE — ED Notes (Signed)
Pt ambulatory to the bathroom multiple times, complaining of constipation.

## 2013-03-05 NOTE — ED Notes (Addendum)
Pt ambulatory to restroom, cooperative at present but still complains "I am being dictated", [facility] remains at bedside,

## 2013-03-05 NOTE — ED Notes (Signed)
Ambulatory to bathroom, very verbal now and loud, various complaints - expressing desire to go home to [place], and rambling about niece , nephew baby dying, crying but no visible tears.  Speech and anxiety escalating

## 2013-03-05 NOTE — ED Notes (Signed)
Pt has be calm, requesting to use phone, pt can be heard in room yelling with door closed. Officer at the bedside

## 2013-03-05 NOTE — ED Notes (Signed)
Pt out to nursing desk, yelling about using the phone, religion and a female that is "running around free", pt continues to ramble in her conversation, attempts to reorient pt, calm pt down without success, pt redirected to room, pt continues to become agitated, Dr. Lita Mains notified, additional orders given . [facility] remains at bedside,

## 2013-03-05 NOTE — ED Notes (Signed)
Pt states she is afraid to be in the room alone, wants me in the room, telepsy started now

## 2013-03-05 NOTE — ED Notes (Addendum)
[facility] called for report.  [phone]

## 2013-03-05 NOTE — ED Notes (Signed)
Tele psych being performed at present time, [facility] at bedside,

## 2013-03-06 MED ORDER — LORAZEPAM 2 MG/ML IJ SOLN
2.0000 mg | Freq: Once | INTRAMUSCULAR | Status: AC
  Administered 2013-03-06: 2 mg via INTRAMUSCULAR
  Filled 2013-03-06: qty 1

## 2013-03-06 MED ORDER — OLANZAPINE 5 MG PO TABS
2.5000 mg | ORAL_TABLET | Freq: Two times a day (BID) | ORAL | Status: DC
  Administered 2013-03-06 – 2013-03-08 (×4): 2.5 mg via ORAL
  Filled 2013-03-06 (×5): qty 0.5
  Filled 2013-03-06: qty 1
  Filled 2013-03-06 (×3): qty 0.5

## 2013-03-06 MED ORDER — ZIPRASIDONE MESYLATE 20 MG IM SOLR
20.0000 mg | Freq: Once | INTRAMUSCULAR | Status: AC
  Administered 2013-03-06: 20 mg via INTRAMUSCULAR
  Filled 2013-03-06: qty 20

## 2013-03-06 MED ORDER — DIVALPROEX SODIUM ER 500 MG PO TB24
500.0000 mg | ORAL_TABLET | Freq: Two times a day (BID) | ORAL | Status: DC
  Administered 2013-03-06 – 2013-03-07 (×4): 500 mg via ORAL
  Filled 2013-03-06 (×5): qty 1

## 2013-03-06 MED ORDER — STERILE WATER FOR INJECTION IJ SOLN
INTRAMUSCULAR | Status: AC
  Administered 2013-03-06: 23:00:00
  Filled 2013-03-06: qty 10

## 2013-03-06 NOTE — ED Notes (Signed)
Patient was walking to the bathroom and became upset that it was occupied by another patient. Patient started yelling and becoming aggressive, security and sherrif's department at bedside. Dr. Eulis Foster also at bedside attempting to communicate with patient, patient yelling and not allowing Dr. Eulis Foster to speak.

## 2013-03-06 NOTE — ED Notes (Signed)
Pt c/o pain to her back 8/10.

## 2013-03-06 NOTE — ED Notes (Signed)
Offered patient a shower multiple times today, patient refused.

## 2013-03-06 NOTE — ED Notes (Signed)
Patient requesting to call her mother. Explained to patient that after this phone call she was only allowed one more for the day. Patient verbalized understanding.

## 2013-03-06 NOTE — ED Notes (Signed)
Patient asleep at this time, as to not aggravate her I will give her 6pm medications with her 10pm medications.

## 2013-03-06 NOTE — ED Notes (Signed)
Spoke with Judith Rice at [facility] in regard to getting pt out of our dept. Pt is screaming and cursing and disrupting the department. Pt states she can scream until her voice is gone.

## 2013-03-06 NOTE — ED Notes (Signed)
Spoke with Bruce at [facility]. Bruce reports patient has been accepted to [facility], however they do not have any empty beds at this time. Patient is also waiting for consult for [facility]. Patient updated with plan of care.

## 2013-03-06 NOTE — ED Notes (Signed)
Pt yelling saying nobody cares about her & says she did not ask to be here. Pt requesting the cuffs to be removed, pt informed that was the policy of the [facility].

## 2013-03-06 NOTE — ED Notes (Signed)
Inquired about patient's 10am medications, pharmacy reports their computer systems have been down and medications will be sent shortly.

## 2013-03-06 NOTE — ED Notes (Signed)
Patient reporting nausea, will give zofran per holding orders.

## 2013-03-06 NOTE — ED Notes (Signed)
Patient requesting a shower. Volney Presser and [facility] department officer to escort patient.

## 2013-03-06 NOTE — ED Notes (Signed)
Provided beverage and snack per patient's request.

## 2013-03-06 NOTE — ED Notes (Signed)
Patient upset and wishing to speak with her mother. Provided patient with a phone and discussed that she would only be allowed to speak for 5 minutes.

## 2013-03-06 NOTE — ED Notes (Signed)
Went to reassess chest pain, patient currently asleep. Sherrif outside patient's room within sight.

## 2013-03-06 NOTE — ED Notes (Addendum)
Patient reporting chest pain, Dr. Eulis Foster made aware.  Sprite provided per patient's request.

## 2013-03-06 NOTE — ED Provider Notes (Signed)
Patient has been reportedly accepted at behavioral health. No available bed at this time. She has become agitated and has been unable to de-escalate. She will be given IM Geodon again.  Jasper Riling. Alvino Chapel, MD 03/06/13 2303

## 2013-03-06 NOTE — ED Notes (Signed)
Spoke with pharmacy regarding 1245 administration of Depakote 500 ER tablet. Pharmacy advised to go ahead and give the two prescribed doses today even though patient had Depakote 250mg  DR tablet. Waiting for pharmacy to send medication.

## 2013-03-06 NOTE — Progress Notes (Signed)
Call received from [facility] ED regarding pt's disruptive behavior on the unit.  It was reported by nursing staff that pt has not been able to calm down by de-escalation methods nor medications.  Paperwork has begun for [facility] referral.

## 2013-03-06 NOTE — ED Notes (Signed)
I spoke with Toyka at [facility] regarding placement for pt. Paper work has not be completed yet to send to [facility]

## 2013-03-06 NOTE — ED Notes (Signed)
Waiting on placement to psych facility.

## 2013-03-06 NOTE — ED Provider Notes (Signed)
14:45- the patient is known to me, I saw her when she came to the emergency department on this visit. She is being treated psychiatrically, while attempts are made to place her in a psychiatric facility. During this shift, she has been agitated to the bathroom several times and continues to cry out. A few minutes ago the [employer] had to place her in handcuffs to keep her in bed. She was given the medication recommendations yesterday. These were not acted on then, I was able to order the medications, at 1245 today. Unfortunately, they have not all been given. At this time she is very agitated, not directable, and confused. She briefly stops yelling to talk to me about quickly escalates. She does not seem to recall that I saw her several days ago. She appears to be acutely psychotic. Will give chemical restraint, once again, with IM medications.  Richarda Blade, MD 03/06/13 [phone]

## 2013-03-07 ENCOUNTER — Encounter (HOSPITAL_COMMUNITY): Payer: Self-pay | Admitting: Emergency Medicine

## 2013-03-07 MED ORDER — KETOROLAC TROMETHAMINE 60 MG/2ML IM SOLN
60.0000 mg | Freq: Once | INTRAMUSCULAR | Status: AC
  Administered 2013-03-07: 60 mg via INTRAMUSCULAR
  Filled 2013-03-07: qty 2

## 2013-03-07 MED ORDER — PANTOPRAZOLE SODIUM 40 MG PO TBEC
DELAYED_RELEASE_TABLET | ORAL | Status: AC
  Administered 2013-03-07: 40 mg via ORAL
  Filled 2013-03-07: qty 1

## 2013-03-07 NOTE — Progress Notes (Signed)
Per Carolyne Littles Mht (dispositions), [facility] referral paperwork has been initiated. I spoke with Greta Doom M[facility] (disposition) this am who is seeking placement as well as working on completion of [place] paperwork. I spoke with Bethena Roys, ED charge nurse and informed her of the above. Concha Norway RN-BC

## 2013-03-07 NOTE — ED Notes (Signed)
Pt resting calmly w/ eyes closed. Rise & fall of the chest noted. [facility] at bedside & pt shackled to bed. Bed in low position, side rails up x2. NAD noted at this time.

## 2013-03-07 NOTE — ED Notes (Signed)
Pt made third and final phone call for the day

## 2013-03-07 NOTE — ED Notes (Signed)
Pt ambulated to bathroom 

## 2013-03-07 NOTE — ED Notes (Signed)
Pt allowed to use the phone at this time. Pt knows she is allowed a 5 minute call.

## 2013-03-07 NOTE — Progress Notes (Signed)
B.Saran Laviolette, M[facility] provided follow up regarding [facility] referral spoke with Lelon Frohlich who reports that patients referral is still pending medical review. TTS to follow up with status of wait list

## 2013-03-07 NOTE — ED Notes (Signed)
[place] [phone], is Camera operator at [employer] and is available and willing to help with pt when possible, pt calmer following visit

## 2013-03-07 NOTE — Progress Notes (Signed)
Bruce Womble Mht (disposition) informed me that [facility] authorization from [facility] has been obtained and he continues with the process for [facility] referral. Concha Norway RN-BC

## 2013-03-07 NOTE — ED Notes (Signed)
Pt remains agitated, tearful, report that she is in pain, sts that handcuff  is hurting her ankle, cuff moved from right to left leg and site inspected, no injury noted, Dr Dewayne Hatch notified of pt's pain and Toradol given per order

## 2013-03-07 NOTE — ED Notes (Addendum)
Pt awake and calm at this time.  Pt states she wants to go home to be with her boyfriend on Valentines Day. Pt want to speak to the phychiatric again. Pt informed she would be reevaluated sometime today but unable to give her any kind of time frame.

## 2013-03-07 NOTE — ED Notes (Signed)
Spoke with Cindee Salt at [facility] who sts pt is accepted but they have no beds and will not have any female discharges today, sts they are also trying to dispo pt at [facility]

## 2013-03-07 NOTE — Progress Notes (Signed)
B.Antero Derosia, MHT completed referral for [facility] obtained authorization through [facility] # [id number] provided by Maren Beach, clinician. Patient has been authorized from 03/07/13 until 03/13/13. Referral has been faxed to [facility] for review for wait list status pending at this time. Follow up will be provided.

## 2013-03-07 NOTE — ED Notes (Signed)
Requesting a breathing treatment. RT notified. Patient in no distress, talks in full sentences.

## 2013-03-07 NOTE — ED Notes (Signed)
Pt made phone call number 2 for the day

## 2013-03-07 NOTE — ED Notes (Signed)
Pt given regular Sprite per request

## 2013-03-07 NOTE — ED Notes (Signed)
Pt's pastor, Sherron Flemings to department.  Explained to visitor that visitation hours are 1700 - 1730 but that he was welcome to visit briefly with pt at this time.  Pt tearful and agitated but happy to see visitor.

## 2013-03-07 NOTE — ED Notes (Signed)
Patient awake. [employer] at bedside. Crying at times, yelling at times to officer and staff.

## 2013-03-08 ENCOUNTER — Inpatient Hospital Stay (HOSPITAL_COMMUNITY)
Admission: AD | Admit: 2013-03-08 | Discharge: 2013-03-13 | DRG: 885 | Disposition: A | Discharge status: Home / Self Care | Payer: MEDICAID | Source: Ambulatory Visit | Attending: Psychiatry | Admitting: Psychiatry

## 2013-03-08 ENCOUNTER — Encounter (HOSPITAL_COMMUNITY): Payer: Self-pay | Admitting: Psychiatry

## 2013-03-08 DIAGNOSIS — F4310 Post-traumatic stress disorder, unspecified: Secondary | ICD-10-CM

## 2013-03-08 DIAGNOSIS — F319 Bipolar disorder, unspecified: Secondary | ICD-10-CM

## 2013-03-08 DIAGNOSIS — F29 Unspecified psychosis not due to a substance or known physiological condition: Secondary | ICD-10-CM

## 2013-03-08 DIAGNOSIS — F3160 Bipolar disorder, current episode mixed, unspecified: Principal | ICD-10-CM | POA: Diagnosis present

## 2013-03-08 DIAGNOSIS — J449 Chronic obstructive pulmonary disease, unspecified: Secondary | ICD-10-CM

## 2013-03-08 HISTORY — DX: Anxiety disorder, unspecified: F41.9

## 2013-03-08 MED ORDER — OLANZAPINE 2.5 MG PO TABS
2.5000 mg | ORAL_TABLET | Freq: Two times a day (BID) | ORAL | Status: DC
  Administered 2013-03-09: 2.5 mg via ORAL
  Filled 2013-03-08 (×6): qty 1

## 2013-03-08 MED ORDER — GABAPENTIN 300 MG PO CAPS
300.0000 mg | ORAL_CAPSULE | Freq: Three times a day (TID) | ORAL | Status: DC
  Administered 2013-03-09: 300 mg via ORAL
  Filled 2013-03-08 (×8): qty 1

## 2013-03-08 MED ORDER — BUDESONIDE-FORMOTEROL FUMARATE 80-4.5 MCG/ACT IN AERO
2.0000 | INHALATION_SPRAY | Freq: Two times a day (BID) | RESPIRATORY_TRACT | Status: DC
  Administered 2013-03-09 – 2013-03-13 (×10): 2 via RESPIRATORY_TRACT
  Filled 2013-03-08 (×3): qty 6.9

## 2013-03-08 MED ORDER — MAGNESIUM HYDROXIDE 400 MG/5ML PO SUSP: 30.0000 mL | Freq: Every day | ORAL | Status: DC | PRN

## 2013-03-08 MED ORDER — DIVALPROEX SODIUM ER 500 MG PO TB24
500.0000 mg | ORAL_TABLET | Freq: Two times a day (BID) | ORAL | Status: DC
  Administered 2013-03-08 – 2013-03-09 (×2): 500 mg via ORAL
  Filled 2013-03-08 (×8): qty 1

## 2013-03-08 MED ORDER — STERILE WATER FOR INJECTION IJ SOLN
INTRAMUSCULAR | Status: AC
  Administered 2013-03-08: 13:00:00
  Filled 2013-03-08: qty 10

## 2013-03-08 MED ORDER — SIMVASTATIN 20 MG PO TABS
30.0000 mg | ORAL_TABLET | Freq: Every day | ORAL | Status: DC
  Administered 2013-03-09 – 2013-03-12 (×4): 30 mg via ORAL
  Filled 2013-03-08 (×5): qty 1

## 2013-03-08 MED ORDER — FLUTICASONE PROPIONATE 50 MCG/ACT NA SUSP
1.0000 | Freq: Every day | NASAL | Status: DC
  Administered 2013-03-09 – 2013-03-12 (×5): 1 via NASAL
  Filled 2013-03-08 (×2): qty 16

## 2013-03-08 MED ORDER — LORAZEPAM 2 MG/ML IJ SOLN
1.5000 mg | Freq: Once | INTRAMUSCULAR | Status: AC
  Administered 2013-03-08: 1.5 mg via INTRAMUSCULAR
  Filled 2013-03-08: qty 1

## 2013-03-08 MED ORDER — TRIAMTERENE-HCTZ 37.5-25 MG PO TABS
2.0000 | ORAL_TABLET | Freq: Every day | ORAL | Status: DC
  Administered 2013-03-09 – 2013-03-13 (×5): 2 via ORAL
  Filled 2013-03-08 (×7): qty 2

## 2013-03-08 MED ORDER — RIVAROXABAN 20 MG PO TABS
20.0000 mg | ORAL_TABLET | Freq: Every day | ORAL | Status: DC
  Administered 2013-03-09 – 2013-03-12 (×4): 20 mg via ORAL
  Filled 2013-03-08 (×5): qty 1

## 2013-03-08 MED ORDER — HYDROXYZINE HCL 50 MG PO TABS: 50.0000 mg | ORAL_TABLET | Freq: Three times a day (TID) | ORAL | Status: DC | PRN

## 2013-03-08 MED ORDER — ZIPRASIDONE MESYLATE 20 MG IM SOLR
10.0000 mg | Freq: Once | INTRAMUSCULAR | Status: AC
Start: 2013-03-08 — End: 2013-03-08
  Administered 2013-03-08: 10 mg via INTRAMUSCULAR
  Filled 2013-03-08: qty 20

## 2013-03-08 MED ORDER — ALBUTEROL SULFATE HFA 108 (90 BASE) MCG/ACT IN AERS
2.0000 | INHALATION_SPRAY | Freq: Four times a day (QID) | RESPIRATORY_TRACT | Status: DC | PRN
  Administered 2013-03-09 – 2013-03-12 (×2): 2 via RESPIRATORY_TRACT
  Filled 2013-03-08: qty 6.7

## 2013-03-08 MED ORDER — IPRATROPIUM-ALBUTEROL 0.5-2.5 (3) MG/3ML IN SOLN
3.0000 mL | Freq: Four times a day (QID) | RESPIRATORY_TRACT | Status: DC | PRN
  Filled 2013-03-08: qty 3

## 2013-03-08 MED ORDER — ALBUTEROL SULFATE HFA 108 (90 BASE) MCG/ACT IN AERS: 2.0000 | INHALATION_SPRAY | Freq: Four times a day (QID) | RESPIRATORY_TRACT | Status: DC | PRN

## 2013-03-08 MED ORDER — CLONAZEPAM 1 MG PO TABS
1.0000 mg | ORAL_TABLET | Freq: Every day | ORAL | Status: DC
  Administered 2013-03-08: 1 mg via ORAL
  Filled 2013-03-08: qty 1

## 2013-03-08 MED ORDER — PANTOPRAZOLE SODIUM 40 MG PO TBEC
40.0000 mg | DELAYED_RELEASE_TABLET | Freq: Every day | ORAL | Status: DC
  Administered 2013-03-09 – 2013-03-13 (×5): 40 mg via ORAL
  Filled 2013-03-08 (×7): qty 1

## 2013-03-08 NOTE — ED Provider Notes (Signed)
8:46 AM Per nursing, [facility] requesting transfer to psych ED at [facility]. Pt is IVC'd. Has been in ED here for 5 days and continues with disruptive behavior requiring chemical restraints. She would probably be better served in environment of psych ED.   Virgel Manifold, MD 03/08/13 [phone]

## 2013-03-08 NOTE — ED Notes (Signed)
Bed: [facility] Expected date:  Expected time:  Means of arrival:  Comments: [facility] transfer

## 2013-03-08 NOTE — Progress Notes (Signed)
Called report to Gilmore Laroche, she advised Probation officer not to give patient's HS medications before sending her over to [facility]. HS medication would be given at [facility] by patient's primary RN Collie Siad B. Patient denied SI/HI, appreciative of care received at [facility] ED. [facility] Department transported patient to [facility].

## 2013-03-08 NOTE — ED Notes (Signed)
Patient is resting comfortably. 

## 2013-03-08 NOTE — ED Notes (Signed)
Called to give report to [employer], he to call me back

## 2013-03-08 NOTE — ED Notes (Signed)
Patient  resting comfortably. Received Ibuprofen for pain.

## 2013-03-08 NOTE — BH Assessment (Signed)
Per bridge call this am it was decided that a request of transfer to psych ED at [facility] would be coordinated. Per administration she would probably be better served in environment of psych ED. Pt in need of a reassessment by TTS, psychiatry, extender once she arrives to the psych ed to determine further recommendations.   Patient also referred to [facility], per shift report. Writer called to confirm that patient's referral was received.  Robinette 03/08/2013 1023 @ [facility] sts that all referral information was received but patient has not been accepted to the wait list. Sts that patient is under medical review at this time. Staff will need to follow up on this patient's status for [place].

## 2013-03-08 NOTE — ED Notes (Signed)
Pt screaming and yelling.  Charge nurse asked pt to quiet down pt refusing to calm down.  Pt using screaming profanity at the officer.  Notified edp

## 2013-03-08 NOTE — Progress Notes (Signed)
Per Marland Mcalpine, RN, pt has been added to [facility] waitlist.   Wyvonnia Dusky, MHT/NS
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
## 2013-03-08 NOTE — ED Notes (Signed)
Patient ambulatory to bathroom. Patient wants to speak with the MD and she wants to speak to tele psych again.

## 2013-03-08 NOTE — BH Assessment (Signed)
TP scheduled for this patient at 1400. Writer notified nursing staff of TP time.

## 2013-03-08 NOTE — ED Notes (Signed)
Patient appeared anxious at the beginning of this shift. Her thought process disorganized and tangential. She complaint of back pain of 6 on a scale of 1-10 with 10 the worst. Mood and affect sad and irritable. Writer administered Vistaril 50 mg and Ibuprofen 600 mg. Offered patient snack and beverage. Q 15 minute check continues as ordered to maintain safety.

## 2013-03-09 ENCOUNTER — Encounter (HOSPITAL_COMMUNITY): Payer: Self-pay | Admitting: *Deleted

## 2013-03-09 DIAGNOSIS — F1920 Other psychoactive substance dependence, uncomplicated: Secondary | ICD-10-CM | POA: Insufficient documentation

## 2013-03-09 DIAGNOSIS — F4310 Post-traumatic stress disorder, unspecified: Secondary | ICD-10-CM | POA: Diagnosis present

## 2013-03-09 DIAGNOSIS — F3160 Bipolar disorder, current episode mixed, unspecified: Principal | ICD-10-CM | POA: Diagnosis present

## 2013-03-09 MED ORDER — ARIPIPRAZOLE 5 MG PO TABS
5.0000 mg | ORAL_TABLET | Freq: Two times a day (BID) | ORAL | Status: DC
  Administered 2013-03-09 – 2013-03-10 (×2): 5 mg via ORAL
  Filled 2013-03-09 (×6): qty 1

## 2013-03-09 MED ORDER — TRIHEXYPHENIDYL HCL 5 MG PO TABS
5.0000 mg | ORAL_TABLET | Freq: Two times a day (BID) | ORAL | Status: DC
  Administered 2013-03-09 – 2013-03-13 (×8): 5 mg via ORAL
  Filled 2013-03-09 (×11): qty 1

## 2013-03-09 MED ORDER — NICOTINE 21 MG/24HR TD PT24
21.0000 mg | MEDICATED_PATCH | Freq: Every day | TRANSDERMAL | Status: DC
  Administered 2013-03-09 – 2013-03-13 (×5): 21 mg via TRANSDERMAL
  Filled 2013-03-09 (×6): qty 1

## 2013-03-09 MED ORDER — LORAZEPAM 1 MG PO TABS
2.0000 mg | ORAL_TABLET | Freq: Four times a day (QID) | ORAL | Status: DC | PRN
  Administered 2013-03-10 – 2013-03-12 (×8): 2 mg via ORAL
  Filled 2013-03-09 (×8): qty 2

## 2013-03-09 MED ORDER — GABAPENTIN 300 MG PO CAPS
600.0000 mg | ORAL_CAPSULE | Freq: Three times a day (TID) | ORAL | Status: DC
  Administered 2013-03-09 – 2013-03-10 (×4): 600 mg via ORAL
  Filled 2013-03-09 (×9): qty 2

## 2013-03-09 MED ORDER — HYDROXYZINE HCL 50 MG PO TABS
50.0000 mg | ORAL_TABLET | Freq: Three times a day (TID) | ORAL | Status: DC | PRN
  Administered 2013-03-09 – 2013-03-12 (×3): 50 mg via ORAL
  Filled 2013-03-09 (×3): qty 1
  Filled 2013-03-09: qty 9

## 2013-03-09 MED ORDER — CITALOPRAM HYDROBROMIDE 20 MG PO TABS
20.0000 mg | ORAL_TABLET | Freq: Every day | ORAL | Status: DC
  Administered 2013-03-09 – 2013-03-10 (×2): 20 mg via ORAL
  Filled 2013-03-09 (×4): qty 1

## 2013-03-09 MED ORDER — LORAZEPAM 1 MG PO TABS: 2.0000 mg | ORAL_TABLET | Freq: Four times a day (QID) | ORAL | Status: DC | PRN
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
## 2013-03-09 NOTE — BHH Group Notes (Signed)
[place] Group Notes: (Clinical Social Work)   03/09/2013      Type of Therapy:  Group Therapy   Participation Level:  Did Not Attend    Selmer Dominion, LCSW 03/09/2013, 12:45 PM

## 2013-03-09 NOTE — Progress Notes (Signed)
Patient ID: Judith Rice, female   DOB: 02/21/1975, 38 y.o.   MRN: [id number] Psychoeducational Group Note  Date:  03/09/2013 Time:0930am  Group Topic/Focus:  Identifying Needs:   The focus of this group is to help patients identify their personal needs that have been historically problematic and identify healthy behaviors to address their needs.  Participation Level:  Active  Participation Quality:  Monopolizing  Affect:  Angry, Anxious, Irritable and Labile  Cognitive:  Lacking  Insight:  Monopolizing  Engagement in Group:  Monopolizing  Additional Comments:  Healthy coping skills.   [name] 03/09/2013,11:01 AM

## 2013-03-09 NOTE — Progress Notes (Signed)
Psychoeducational Group Note  Date:  03/09/2013 Time:  8:00 p.m.   Group Topic/Focus:  Wrap-Up Group:   The focus of this group is to help patients review their daily goal of treatment and discuss progress on daily workbooks.  Participation Level: Did Not Attend  Participation Quality:  Not Applicable  Affect:  Not Applicable  Cognitive:  Not Applicable  Insight:  Not Applicable  Engagement in Group: Not Applicable  Additional Comments: The patient did not attend group since she had yet to be admitted to the hallway.   Archie Balboa S 03/09/2013, 12:52 AM

## 2013-03-09 NOTE — Progress Notes (Signed)
Patient ID: Judith Rice, female   DOB: 1975-03-30, 38 y.o.   MRN: [id number] Psychoeducational Group Note  Date:  03/09/2013 Time:0900am  Group Topic/Focus:  Identifying Needs:   The focus of this group is to help patients identify their personal needs that have been historically problematic and identify healthy behaviors to address their needs.  Participation Level:  Active  Participation Quality:  Redirectable  Affect:  Angry, Anxious, Irritable and Labile  Cognitive:  Lacking  Insight:  Monopolizing  Engagement in Group:  Monopolizing  Additional Comments:  Inventory group   [name] 03/09/2013,11:00 AM

## 2013-03-09 NOTE — Progress Notes (Signed)
Pt reports that not all her belongings are accounted for on arrival. Judith Rice was called and they did state that the rest of her belongings are there and that they would get them to [place] when they could.

## 2013-03-09 NOTE — Progress Notes (Signed)
Patient belongings currently at [facility] [place] Notified patient's RN, Rolena Infante and AC-RN Otila Kluver. Belongings to be sent over via [employer] at [employer].

## 2013-03-09 NOTE — Tx Team (Signed)
Initial Interdisciplinary Treatment Plan  PATIENT STRENGTHS: (choose at least two) Ability for insight Average or above average intelligence Capable of independent living  PATIENT STRESSORS: Medication change or noncompliance   PROBLEM LIST: Problem List/Patient Goals Date to be addressed Date deferred Reason deferred Estimated date of resolution  Bipolar 03/08/13     Medication compliance 03/08/13                                                DISCHARGE CRITERIA:  Ability to meet basic life and health needs Improved stabilization in mood, thinking, and/or behavior Verbal commitment to aftercare and medication compliance  PRELIMINARY DISCHARGE PLAN: Attend aftercare/continuing care group  PATIENT/FAMIILY INVOLVEMENT: This treatment plan has been presented to and reviewed with the patient, Judith Rice, and/or family member, .  The patient and family have been given the opportunity to ask questions and make suggestions.  [place], [place] 03/09/2013, 1:11 AM
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
## 2013-03-09 NOTE — Progress Notes (Signed)
Patient ID: Judith Rice, female   DOB: 1975/09/04, 38 y.o.   MRN: [id number] DATA:  Patient appeared anxious today.  Was concerned about belongings left behind at [facility].  Patient was able to fill out her self inventory sheet.  Patient was reluctant but eventually cooperative with taking medication.  Patient  States she is sleeping fair and that she has a good appetite.  Patient rates depression and hopelessness a 0.5 on a scale of 1-10.  Patient states she does feel confused at times and does not now why she is here.  Patient denies any suicidal or homical thoughts at this time.  Patient able to meet with Dr. Darleene Cleaver today and communicate her concerns and needs.  Patient is on Xeralto for DVT prophylasis and is a moderate fall risk.  Patient complaint of chronic back pain which she is taking medication for.  ACTION:  Patient educated and reinforced to focus on being cooperative while she is here and to take medications as perscribed.  Patient encouraged to attend group psychoeducational meeting today.  Patients belongings where identified and [facility] and are to arrive here later today, patient made aware of this and was relieved.  Staff monitoring the patient for 15 minute checks as well as identifying her as a fall risk patient with yellow arm band and sign on door.     RESPONSE:  Patient taking medications as prescribed.  Patient did attend group psycoeducational group today.  Patient is on a do not admit room for single patient room status.  Staff will continue to do 15 minute checks as well as monitor for fall risks.
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
## 2013-03-09 NOTE — BHH Suicide Risk Assessment (Signed)
   Nursing information obtained from:    Demographic factors:    Current Mental Status:    Loss Factors:    Historical Factors:    Risk Reduction Factors:    Total Time spent with patient: 30 minutes  CLINICAL FACTORS:   Severe Anxiety and/or Agitation Bipolar Disorder:   Mixed State Depression:   Aggression Anhedonia Comorbid alcohol abuse/dependence Hopelessness Impulsivity Insomnia Alcohol/Substance Abuse/Dependencies Unstable or Poor Therapeutic Relationship  Psychiatric Specialty Exam: Physical Exam  Psychiatric: Thought content normal. Her mood appears anxious. Her affect is angry and labile. Her speech is rapid and/or pressured. She is agitated, aggressive and hyperactive. Cognition and memory are normal. She expresses impulsivity. She exhibits a depressed mood.    Review of Systems  Constitutional: Negative.   HENT: Positive for congestion. Negative for ear discharge, ear pain, hearing loss, nosebleeds, sore throat and tinnitus.   Eyes: Negative.   Respiratory: Negative.  Negative for stridor.   Cardiovascular: Negative.   Gastrointestinal: Negative.   Genitourinary: Negative.   Musculoskeletal: Positive for back pain and myalgias.  Skin: Negative.   Neurological: Negative.  Negative for headaches.  Endo/Heme/Allergies: Negative.   Psychiatric/Behavioral: Positive for depression and substance abuse. The patient is nervous/anxious.     Blood pressure 116/65, pulse 111, temperature 97.9 F (36.6 C), temperature source Oral, resp. rate 18, height 5\' 4"  (1.626 m), weight 157.398 kg (347 lb).Body mass index is 59.53 kg/(m^2).  General Appearance: Disheveled  Eye Contact::  Good  Speech:  Pressured and loud  Volume:  Increased  Mood:  Angry and Irritable  Affect:  Labile and Full Range  Thought Process:  Circumstantial  Orientation:  Full (Time, Place, and Person)  Thought Content:  Negative  Suicidal Thoughts:  No  Homicidal Thoughts:  No  Memory:  Immediate;    Fair Recent;   Fair Remote;   Fair  Judgement:  Poor  Insight:  Lacking  Psychomotor Activity:  Increased  Concentration:  Fair  Recall:  [employer] of Knowledge:Fair  Language: Good  Akathisia:  No  Handed:  Ambidextrous  AIMS (if indicated):     Assets:  Communication Skills Desire for Improvement  Sleep:  Number of Hours: 5.25   Musculoskeletal: Strength & Muscle Tone: within normal limits Gait & Station: normal Patient leans: N/A  COGNITIVE FEATURES THAT CONTRIBUTE TO RISK:  Closed-mindedness Polarized thinking    SUICIDE RISK:   Minimal: No identifiable suicidal ideation.  Patients presenting with no risk factors but with morbid ruminations; may be classified as minimal risk based on the severity of the depressive symptoms  PLAN OF CARE:1. Admit for crisis management and stabilization. 2. Medication management to reduce current symptoms to base line and improve the  patient's overall level of functioning 3. Treat health problems as indicated. 4. Develop treatment plan to decrease risk of relapse upon discharge and the need for  readmission. 5. Psycho-social education regarding relapse prevention and self care. 6. Health care follow up as needed for medical problems. 7. Restart home medications where appropriate.   I certify that inpatient services furnished can reasonably be expected to improve the patient's condition.  Corena Pilgrim, MD 03/09/2013, 12:04 PM

## 2013-03-10 MED ORDER — CITALOPRAM HYDROBROMIDE 40 MG PO TABS
40.0000 mg | ORAL_TABLET | Freq: Every day | ORAL | Status: DC
  Administered 2013-03-11 – 2013-03-13 (×3): 40 mg via ORAL
  Filled 2013-03-10 (×4): qty 1

## 2013-03-10 MED ORDER — OLANZAPINE 2.5 MG PO TABS
ORAL_TABLET | ORAL | Status: AC
  Filled 2013-03-10: qty 1

## 2013-03-10 MED ORDER — ARIPIPRAZOLE 10 MG PO TABS
10.0000 mg | ORAL_TABLET | Freq: Two times a day (BID) | ORAL | Status: DC
  Administered 2013-03-10 – 2013-03-13 (×6): 10 mg via ORAL
  Filled 2013-03-10 (×8): qty 1

## 2013-03-10 MED ORDER — IBUPROFEN 200 MG PO TABS
800.0000 mg | ORAL_TABLET | Freq: Three times a day (TID) | ORAL | Status: DC | PRN
  Administered 2013-03-10 – 2013-03-12 (×5): 800 mg via ORAL
  Filled 2013-03-10 (×5): qty 4

## 2013-03-10 MED ORDER — GABAPENTIN 400 MG PO CAPS
1200.0000 mg | ORAL_CAPSULE | Freq: Three times a day (TID) | ORAL | Status: DC
  Administered 2013-03-10 – 2013-03-13 (×8): 1200 mg via ORAL
  Filled 2013-03-10 (×11): qty 3
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
## 2013-03-10 NOTE — Progress Notes (Signed)
[place] Group Notes:  (Nursing/MHT/Case Management/Adjunct)  Date:  03/10/2013  Time:  8:00 p.m.   Type of Therapy:  Psychoeducational Skills  Participation Level:  Minimal  Participation Quality:  Attentive  Affect:  Flat  Cognitive:  Appropriate  Insight:  Lacking  Engagement in Group:  Lacking  Modes of Intervention:  Education  Summary of Progress/Problems: The patient described her day as having multiple "ups and downs". She would not elaborate any further. As a theme for the day, she is unsure about her support system. Her family lives in [place] and does not have any support in the local area.   Archie Balboa S 03/10/2013, 10:35 PM
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
## 2013-03-10 NOTE — Progress Notes (Signed)
Patient ID: Judith Rice, female   DOB: Feb 07, 1975, 38 y.o.   MRN: [id number] D)   Has been tearful at times this evening, states hasn't been able to get a hold of her boyfriend, hasn't heard from him today, sad, feels abandoned, not sure where she'll live, worried about finding a place and getting her things back from him.  Has decided to move to [facility] and get into the outpt program here, doesn't want to live in [place] anymore.    States has been trying to focus on writing in her journal today, reading her Bible, wants to get better in order to help others who have helped her.   Stated wanted to get a house and be able to smoke weed.  Started becoming upset when the discussion turned to the negative effects of marijuana and smoking with asthma. A)  Will continue to monitor for safety,  Encourage her to stop smoking encouraged to speak with CM about housing suggestions, shelters, continue POC R)  Safety maintained.

## 2013-03-10 NOTE — BHH Counselor (Signed)
Late Entry  CSW attempted several times yesterday 03/09/13 to meet with the patient to perform the Psychosocial Assessment, but due to her hyperverbal, hypervigilant, manic state, this goal was delayed.  Selmer Dominion, LCSW 03/10/2013, 9:12 AM

## 2013-03-10 NOTE — Progress Notes (Signed)
[place] Group Notes:  (Nursing/MHT/Case Management/Adjunct)  Date:  03/10/2013  Time:  8:00 p.m.   Type of Therapy:  Psychoeducational Skills  Participation Level:  Active  Participation Quality:  Appropriate  Affect:  Depressed  Cognitive:  Appropriate  Insight:  Appropriate  Engagement in Group:  Developing/Improving  Modes of Intervention:  Education  Summary of Progress/Problems: The patient described her day as having been "crazy". She states that she spent much of her day to helping out others on the unit. She admits to laughing and smiling with her peers, but she did not say anything in relation to her peers. In terms of the theme of the day, her relapse prevention will include looking into moving out of [facility].   Archie Balboa S 03/10/2013, 12:49 AM

## 2013-03-10 NOTE — BHH Group Notes (Signed)
[facility] Group Notes:  (Clinical Social Work)  03/10/2013   11:15am-12:00pm  Summary of Progress/Problems:  The main focus of today's process group was to listen to a variety of genres of music and to identify that different types of music provoke different responses.  The patient then was able to identify personally what was soothing for them, as well as energizing.  Handouts were used to record feelings evoked, as well as how patient can personally use this knowledge in sleep habits, with depression, and with other symptoms.  The patient expressed understanding of concepts, as well as knowledge of how each type of music affected her and how this can be used at home as a wellness/recovery tool.  She was hyperverbal, and sometimes it was difficult to redirect her, but she was able to identify her feelings in reaction to various types of music.  She remembers a university music appreciation class and knew quite a bit about the music, actually.  Uses music as a wellness tool at home.  Type of Therapy:  Music Therapy   Participation Level:  Active  Participation Quality:  Attentive and Sharing  Affect:  Blunted, anxious  Cognitive:  Oriented and alert  Insight:  Engaged  Engagement in Therapy:  Engaged  Modes of Intervention:   Activity, Exploration  Selmer Dominion, LCSW 03/10/2013, 12:30pm

## 2013-03-10 NOTE — Progress Notes (Signed)
Patient ID: Judith Rice, female   DOB: Mar 09, 1975, 38 y.o.   MRN: [id number] Psychoeducational Group Note  Date:  03/10/2013 Time:  0900am  Group Topic/Focus:  Making Healthy Choices:   The focus of this group is to help patients identify negative/unhealthy choices they were using prior to admission and identify positive/healthier coping strategies to replace them upon discharge.  Participation Level:  Did Not Attend  Participation Quality:    Affect:  Cognitive:  Insight:  Engagement in Group:   Additional Comments:  inventory sheet.   [name] 03/10/2013,9:46 AM

## 2013-03-10 NOTE — Progress Notes (Signed)
Patient ID: Judith Rice, female   DOB: December 02, 1975, 38 y.o.   MRN: [id number] DATA:  Patient remains anxious and often crying.  She reports that she did not sleep well last night having to request medication and verbalizes concerns over her significant other not calling her.  Patients appetite is improving but states her energy level remains low.  She currently rates her depression a 7 and her hopelessness a 5 on a scale of one to 10.  Patient currently without any physical complaints or concerns.  She denies any suicidal or homicidal ideations.  Patient remains on fall risk precautions as she currently takes Nutritional therapist.  Patient was reported by previous shift nurse to be whining all night complaining about wanting her marijuana. She did fill her self inventory sheet.  ACTION:  Administer medications as prescribed including PRN medications for her anxiety.  Encourage patient to attend all group Psychoeducational meetings.  Continue 15 minute checks and fall risk precautions.  RESPONSE:  Patient took PRN ativan this morning as prescribed which helped with her anxiety.  She did not attend group Psychoeducational meeting.  Staff will continue 15 minute checks and fall risk precautions.

## 2013-03-10 NOTE — Progress Notes (Signed)
Patient ID: Judith Rice, female   DOB: Apr 24, 1975, 38 y.o.   MRN: [id number] Psychoeducational Group Note  Date:  03/10/2013 Time:  0930am  Group Topic/Focus:  Making Healthy Choices:   The focus of this group is to help patients identify negative/unhealthy choices they were using prior to admission and identify positive/healthier coping strategies to replace them upon discharge.  Participation Level:  Did Not Attend  Participation Quality:    Affect:  Cognitive:    Insight:Engagement in Group:    Additional Comments:  Healthy support systems.   [name] 03/10/2013,9:47 AM

## 2013-03-11 MED ORDER — KETOROLAC TROMETHAMINE 30 MG/ML IJ SOLN
15.0000 mg | Freq: Once | INTRAMUSCULAR | Status: AC
  Administered 2013-03-11: 15 mg via INTRAVENOUS

## 2013-03-11 MED ORDER — KETOROLAC TROMETHAMINE 30 MG/ML IJ SOLN
15.0000 mg | Freq: Once | INTRAMUSCULAR | Status: DC
Start: 2013-03-11 — End: 2013-03-11
  Filled 2013-03-11: qty 1

## 2013-03-11 MED ORDER — KETOROLAC TROMETHAMINE 30 MG/ML IJ SOLN
15.0000 mg | Freq: Once | INTRAMUSCULAR | Status: DC
  Filled 2013-03-11: qty 1

## 2013-03-11 MED ORDER — KETOROLAC TROMETHAMINE 30 MG/ML IJ SOLN
INTRAMUSCULAR | Status: AC
  Filled 2013-03-11: qty 1

## 2013-03-11 NOTE — Progress Notes (Signed)
D: Pt presents with crying spells, depressed mood and flat affect. Pt rates her depression 9/10. Pt reports that she is homeless and have no place to go.Pt is attention seeking, demanding, manipulating, verbally aggressive and somatic. On initial approach, pt starting yelling at writer talking about how upset she was because of the care she received at [facility] and how she just received her belongings from there. Pt would not allow writer to speak, pt continued to complain during shift assessment.  Pt reports poor sleep at night. Pt denies SI/HI/AVH. Pt compliant with taking meds and attending groups. A: Medications administered as ordered per MD. Verbal support given. Pt encouraged to attend groups. 15 minute checks performed for safety. R: Pt sarcastic to staff.  Has poor insight for treatment.  Pt safety maintained at this time.

## 2013-03-11 NOTE — Tx Team (Signed)
  Interdisciplinary Treatment Plan Update   Date Reviewed:  03/11/2013  Time Reviewed:  8:17 AM  Progress in Treatment:   Attending groups: Yes Participating in groups: Yes Taking medication as prescribed: Yes  Tolerating medication: Yes Family/Significant other contact made: No Patient understands diagnosis: Yes AEB by asking for help with depressed mood Discussing patient identified problems/goals with staff: Yes  See initial care plan Medical problems stabilized or resolved: Yes Denies suicidal/homicidal ideation: Yes  In tx team Patient has not harmed self or others: Yes  For review of initial/current patient goals, please see plan of care.  Estimated Length of Stay:  4-5 days  Reason for Continuation of Hospitalization: Anxiety Depression Medication stabilization  New Problems/Goals identified:  N/A  Discharge Plan or Barriers:   CSW to work with pt to identify dispositional plan  Additional Comments:  "I have depressed since I was 38 years old and my depression has been getting worse because I cannot afford the co-pay for my medications.''  Patient is a 38 year old woman who reports history of PTSD and Bipolar depression with multiple medical problem. She reports that she was taking to [facility] emergency room few days ago by EMS due to mood lability, depression, anxiety, severe agitation and bizarre behavior. Patient lives in [place], [place] with her fiance, per [facility], bystanders saw pt walking down the road with a large bag of belongings ("waiting for them to come find me" in patient's word). Bystander called the rescue squad who then called 911. On arrival at the ED, she was rambling loudly and is jumping between different subjects. She also expresses some concerns that she is being followed.    Attendees:  Signature: Corena Pilgrim, MD 03/11/2013 8:17 AM   Signature: Ripley Fraise, LCSW 03/11/2013 8:17 AM  Signature: Elmarie Shiley, NP 03/11/2013 8:17 AM   Signature: Mayra Neer, RN 03/11/2013 8:17 AM  Signature: Darrol Angel, RN 03/11/2013 8:17 AM  Signature:  03/11/2013 8:17 AM  Signature:   03/11/2013 8:17 AM  Signature:    Signature:    Signature:    Signature:    Signature:    Signature:      Scribe for Treatment Team:   Ripley Fraise, LCSW  03/11/2013 8:17 AM

## 2013-03-11 NOTE — BHH Group Notes (Signed)
[facility] LCSW Group Therapy  03/11/2013 1:15 pm  Type of Therapy: Process Group Therapy  Participation Level:  Active  Participation Quality:  Appropriate  Affect:  Sleepy  Cognitive:  Oriented  Insight:  Improving  Engagement in Group:  Limited  Engagement in Therapy:  Limited  Modes of Intervention:  Activity, Clarification, Education, Problem-solving and Support  Summary of Progress/Problems: Today's group addressed the issue of overcoming obstacles.  Patients were asked to identify their biggest obstacle post d/c that stands in the way of their on-going success, and then problem solve as to how to manage this.  Jame talked about her obstacle of the relationship she has with her in-laws, and how they are currently dependent on them for help.  Hopes that her SO is coming to see her tonite.  When another patient made a dramatic entrance, Kimiah stayed for awhile, then stated she was too tired to stay and excused herself.  Trish Mage 03/11/2013   2:34 PM

## 2013-03-11 NOTE — Progress Notes (Signed)
Patient ID: Judith Rice, female   DOB: 06/02/1975, 38 y.o.   MRN: [id number] D)   Has spent most of the evening in the dayroom, attended group and watched tv.   When asked about her support system, stated wasn't sure, as her family is in [place].  States wants to live here in [place] and wants to find a house to rent, have a roommate and be able to smoke marijuana.  Later came to med window and was complaining that she was coughing up brownish sputum.  Tried to encourage her to stop smoking, and reminded her that the marijuana has no filter.  Was very defensive about smoking and didn't express desire to quit, but wants to have MD check her in am for bronchitis and possibly an atb.  Was offered a duoneb tx, which she refused, wanted food, tray from dinner warmed up and she was happy .  Stated she was having nightmares about being brought by police in handcuffs, didn't think she could sleep, was given ativan. A)  Will continue to monitor for safety, continue POC,  R) Safety maintained.
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
## 2013-03-11 NOTE — BHH Group Notes (Signed)
[facility] LCSW Aftercare Discharge Planning Group Note   03/11/2013 8:15 AM  Participation Quality:  Engaged  Mood/Affect:  Dramatic  Depression Rating:  8  Anxiety Rating:  8  Thoughts of Suicide:  No Will you contract for safety?   NA  Current AVH:  No  Plan for Discharge/Comments:  Judith Rice became tearful as soon as I walked into the room, and went on to describe the conditions under which she had been living, and how she has no place to go as her boyfriend will not talk to her on the phone, much less come see her here.  Wants to live in [address] here in [place].  Explained to her that she will not be able to make that happen from the hospital, so will need go to shelter from here if she does not return to stay with boyfriend.  She dismissed my suggestion, and I assured her I was very serious.  Transportation Means: unk  Supports: none  [place], [place]

## 2013-03-12 DIAGNOSIS — F1220 Cannabis dependence, uncomplicated: Secondary | ICD-10-CM

## 2013-03-12 DIAGNOSIS — F603 Borderline personality disorder: Secondary | ICD-10-CM

## 2013-03-12 MED ORDER — ALUM & MAG HYDROXIDE-SIMETH 200-200-20 MG/5ML PO SUSP: 30.0000 mL | ORAL | Status: DC | PRN

## 2013-03-12 NOTE — Progress Notes (Signed)
At the beginning of the shift, pt was sitting in the dayroom watching TV with peer.  When Probation officer entered room, pt wanted to speak privately.  At that time, pt had c/o pain to her L shoulder which she said was not relieved by the Ibuprofen she was given earlier.  She stated that she was given Toradol IM at [facility] prior to her admission which helped her some.  Writer spoke with PA who ordered Toradol 15 mg IM x 1 for her pain.  It was not given until 2135 d/t pharmacy/pyxis glitch which prevented writer from being able to obtain the medication until that time.  Pt was also given her other meds at that time.  Pt denies SI/HI/AV.  She showed Probation officer a picture she had drawn for her boyfriend and was hopeful that they could get back together.  She has not been able to reach him on the phone.  She had no c/o withdrawal symptoms.  Pt has been cooperative with Probation officer this shift, although she has been very needy.  At this time, pt is in the bed.  Support and encouragement offered.  Safety maintained with q15 minute checks.

## 2013-03-12 NOTE — Progress Notes (Signed)
Pt was up at 0110 asking for additional med for sleep.  Pt was given Vistaril 50 mg for anxiety at this time.  She also told Probation officer that she had been able to talk with her BF this evening, and that they had a good talk.  Pt was given a snack and encouraged to go back to bed to relax and let medication take affect.  Pt voiced agreement.  Pt remains safe.

## 2013-03-12 NOTE — Progress Notes (Signed)
Adult Psychoeducational Group Note  Date:  03/12/2013 Time:  6:52 PM  Group Topic/Focus:  Therapeutic activity  Participation Level:  Did Not Attend  Participation Quality:    Affect:    Cognitive:    Insight:   Engagement in Group:    Modes of Intervention:    Additional Comments:  Pt did not attend group.  Judith Rice, Brentyn Seehafer 03/12/2013, 6:52 PM

## 2013-03-12 NOTE — Progress Notes (Signed)
Pt has been up a couple of times since 0230 asking for food and soda.  She was offered ice water and a snack. The first time which she took.  The second time, she became irrate, saying she wanted a pt advocate, because she wanted a full meal and not a snack.  She also said she needed more than just water.  She was also angry about another pt on the hall who has been agitated and loud.  She says this pt is "getting on her nerves" and she wants to be discharged.  "I'd hate to hurt an old lady."  Staff continues to de-escalate pt.  Pt is also c/o L shoulder pain again.  She was offered Ibuprofen which she refused.  She said she would take a hot shower and see if that helped.  Will continue to offer the Ibuprofen for pain relief.  Pt continues safe.

## 2013-03-12 NOTE — Progress Notes (Signed)
Pt seems a little calmer this evening, although she still finds things to complain about.  This evening she is upset that she was not allowed to go through her purse that was brought from [facility] after she was admitted to [facility].  It was locked in a locker in the search room.  Explained our locker policy procedure to pt and assured her that security was the only one with a key.  Writer encouraged her to speak to administration during the day for permission to go into the locker.  This information was told to pt yesterday, but she did not mention to anyone about it during the day shift.  Pt continues to request pain medication, but was reminded that she received med at 1614 and it would be midnight before she could have another dose.  Offered a heat pack and Tylenol.  Pt refused the Tylenol.  Pt has asked writer about her discharge and wants to leave tomorrow.  Informed pt that MD makes that decision and she would need to discuss discharge with the MD.  Pt denies SI/HI/AV at this time.  Pt was happy that she got a visit from her BF and his mother this evening.  Pt makes her needs known to staff.  Support and encouragement offered.  Safety maintained with q15 minute checks.

## 2013-03-12 NOTE — Progress Notes (Signed)
Patient ID: Judith Rice, female   DOB: [birth year], 38 y.o.   MRN: [id number] [facility] MD Progress Note  03/12/2013 1:53 PM MARK BENECKE  MRN:  [id number] Subjective: Patient states "I'm trying to be more positive. My medications are helping me. I am very anxious to go home. I miss my family and my boyfriend. I was upset because I did not know where he was and had no money. But then later I found out he had wrapped money in a love letter that I did not see. There is another peer here making me anxious. I have been reading the Bible to cope."   Objective:  Judith Rice remains labile, oppositional at times, and becomes easily agitated. Patient is fixated on getting back to her boyfriend reporting that they had a misunderstanding prior to her admission. She becomes tearful when talking with staff and has flight of ideas during conversation. Patient needed redirection to stay on topic during assessment as her thought content became irrelevant quickly. Patient wanted to discuss the circumstances surrounding her first husband's death. Social worker spoke to her boyfriend's mother who reports that only the patient can return to live there as patient's boyfriend has been causing too many problems. However, the patient maintains that everyone is waiting for her to come home with no active problems. Nursing staff report that her mood continues to be labile as she up at 0230 demanding a full meal, then angrily asking to speak to patient advocate when this request could not be granted. Her Neurontin was increased yesterday to help improve her mood stability. The patient's mood is noted to be more stable than upon admission. Overall she was able to speak pleasantly with Provider today.   Diagnosis:   DSM5: Schizophrenia Disorders:  Delusional Disorder (297.1) Obsessive-Compulsive Disorders:   Trauma-Stressor Disorders:  Posttraumatic Stress Disorder (309.81) Substance/Addictive Disorders:  Cannabis Use Disorder - Severe  (304.30) Depressive Disorders:  Disruptive Mood Dysregulation Disorder (296.99) Total Time spent with patient: 20 minutes  Axis I: Bipolar I disorder, most recent episode (or current) mixed, unspecified            PTSD            History of Polysubstance dependence Axis II: Borderline Personality Dis. Axis III:  Past Medical History  Diagnosis Date  . Nerve damage   . Sleep apnea   . COPD (chronic obstructive pulmonary disease)   . Asthma   . Hypoglycemia associated with diabetes   . Insomnia   . Gallstones   . Cancer   . DVT (deep venous thrombosis)     LLE, 04/2012, Xarelto anticoagulation initiated  . Baker's cyst     left   . Anxiety    Axis IV: other psychosocial or environmental problems and problems related to social environment  ADL's:  Intact  Sleep: Fair  Appetite:  Fair  Suicidal Ideation:  Plan:  denies Intent:  denies Means:  denies Homicidal Ideation:  Plan:  denies Intent:  denies Means:  denies AEB (as evidenced by):  Psychiatric Specialty Exam: Physical Exam  Psychiatric: Her mood appears anxious. Her affect is labile. Her speech is rapid and/or pressured and tangential. She is agitated and aggressive. Thought content is paranoid and delusional. Cognition and memory are normal. She expresses impulsivity.    Review of Systems  Constitutional: Negative.   HENT: Negative.   Eyes: Negative.   Respiratory: Negative.   Cardiovascular: Negative.   Gastrointestinal: Positive for heartburn.  Genitourinary: Negative.  Musculoskeletal: Negative.   Skin: Negative.   Neurological: Negative.   Endo/Heme/Allergies: Negative.   Psychiatric/Behavioral: Positive for depression and substance abuse. Negative for suicidal ideas, hallucinations and memory loss. The patient is nervous/anxious and has insomnia.     Blood pressure 121/84, pulse 92, temperature 97.4 F (36.3 C), temperature source Oral, resp. rate 20, height 5\' 4"  (1.626 m), weight 157.398 kg (347  lb).Body mass index is 59.53 kg/(m^2).  General Appearance: Fairly Groomed  Engineer, water::  Good  Speech:  Pressured and loud  Volume:  Increased  Mood:  Angry and Irritable  Affect:  Labile and Tearful  Thought Process:  Circumstantial and Disorganized  Orientation:  Full (Time, Place, and Person)  Thought Content:  Delusions and Paranoid Ideation  Suicidal Thoughts:  No  Homicidal Thoughts:  No  Memory:  Immediate;   Fair Recent;   Fair Remote;   Fair  Judgement:  Poor  Insight:  Lacking  Psychomotor Activity:  Increased  Concentration:  Fair  Recall:  [employer] of Knowledge:Fair  Language: Fair  Akathisia:  No  Handed:  Ambidextrous  AIMS (if indicated):     Assets:  Communication Skills  Sleep:  Number of Hours: 3   Musculoskeletal: Strength & Muscle Tone: within normal limits Gait & Station: normal Patient leans: N/A  Current Medications: Current Facility-Administered Medications  Medication Dose Route Frequency Provider Last Rate Last Dose  . albuterol (PROVENTIL HFA;VENTOLIN HFA) 108 (90 BASE) MCG/ACT inhaler 2 puff  2 puff Inhalation Q6H PRN Waylan Boga, NP   2 puff at 03/12/13 1141  . ARIPiprazole (ABILIFY) tablet 10 mg  10 mg Oral BID PC Arelene Moroni   10 mg at 03/12/13 0836  . budesonide-formoterol (SYMBICORT) 80-4.5 MCG/ACT inhaler 2 puff  2 puff Inhalation BID Waylan Boga, NP   2 puff at 03/12/13 0734  . citalopram (CELEXA) tablet 40 mg  40 mg Oral Daily Reyonna Haack   40 mg at 03/12/13 0735  . fluticasone (FLONASE) 50 MCG/ACT nasal spray 1 spray  1 spray Each Nare QHS Waylan Boga, NP   1 spray at 03/11/13 2123  . gabapentin (NEURONTIN) capsule 1,200 mg  1,200 mg Oral TID Alanmichael Barmore   1,200 mg at 03/12/13 1140  . hydrOXYzine (ATARAX/VISTARIL) tablet 50 mg  50 mg Oral TID PRN Tonishia Steffy   50 mg at 03/12/13 0110  . ibuprofen (ADVIL,MOTRIN) tablet 800 mg  800 mg Oral Q8H PRN Kelliann Pendergraph   800 mg at 03/12/13 7371  . ipratropium-albuterol  (DUONEB) 0.5-2.5 (3) MG/3ML nebulizer solution 3 mL  3 mL Nebulization Q6H PRN Waylan Boga, NP      . LORazepam (ATIVAN) tablet 2 mg  2 mg Oral Q6H PRN Lin Glazier   2 mg at 03/11/13 2122  . magnesium hydroxide (MILK OF MAGNESIA) suspension 30 mL  30 mL Oral Daily PRN Waylan Boga, NP      . nicotine (NICODERM CQ - dosed in mg/24 hours) patch 21 mg  21 mg Transdermal Daily Lurena Nida, NP   21 mg at 03/12/13 0626  . pantoprazole (PROTONIX) EC tablet 40 mg  40 mg Oral Daily Waylan Boga, NP   40 mg at 03/12/13 0734  . Rivaroxaban (XARELTO) tablet 20 mg  20 mg Oral Daily Waylan Boga, NP   20 mg at 03/11/13 1657  . simvastatin (ZOCOR) tablet 30 mg  30 mg Oral q1800 Waylan Boga, NP   30 mg at 03/11/13 1657  . triamterene-hydrochlorothiazide (MAXZIDE-25) 37.5-25  MG per tablet 2 tablet  2 tablet Oral Daily Waylan Boga, NP   2 tablet at 03/12/13 0734  . trihexyphenidyl (ARTANE) tablet 5 mg  5 mg Oral BID WC Sola Margolis   5 mg at 03/12/13 [id number]    Lab Results: No results found for this or any previous visit (from the past 48 hour(s)).  Physical Findings: AIMS: Facial and Oral Movements Muscles of Facial Expression: None, normal Lips and Perioral Area: None, normal Jaw: None, normal Tongue: None, normal,Extremity Movements Upper (arms, wrists, hands, fingers): None, normal Lower (legs, knees, ankles, toes): None, normal, Trunk Movements Neck, shoulders, hips: None, normal, Overall Severity Severity of abnormal movements (highest score from questions above): None, normal Incapacitation due to abnormal movements: None, normal Patient's awareness of abnormal movements (rate only patient's report): No Awareness, Dental Status Current problems with teeth and/or dentures?: No Does patient usually wear dentures?: No  CIWA:    COWS:     Treatment Plan Summary: Daily contact with patient to assess and evaluate symptoms and progress in treatment Medication management  Plan:1. Continue  crisis management and stabilization. 2. Medication management to reduce current symptoms to base line and improve the patient's overall level of functioning: Continue Abilify 10mg  po BID for mood/delusions, Celexa 40mg  po daily for PTSD/ depression, Gabapentin to 1200mg  TID for mood stabilization. 3. Treat health problems as indicated. 4. Develop treatment plan to decrease risk of relapse upon discharge and the need for readmission. 5. Psycho-social education regarding relapse prevention and self care. 6. Health care follow up as needed for medical problems. 7. Restart home medications where appropriate. 8. Anticipate d/c in 1-2 days.   Medical Decision Making Problem Points:  Established problem, improving(1) review of last therapy session (1) and Review of psycho-social stressors (1) Data Points:  Order Aims Assessment (2) Review of medication regiment & side effects (2) Review of new medications or change in dosage (2)  I certify that inpatient services furnished can reasonably be expected to improve the patient's condition.   Elmarie Shiley, NP-C 03/12/2013, 1:53 PM  Patient seen, evaluated and I agree with notes by Nurse Practitioner. Corena Pilgrim, MD

## 2013-03-12 NOTE — [facility] Group Notes (Signed)
Airam Runions [place] LCSW Group Therapy  03/12/2013 , 1:43 PM   Type of Therapy:  Group Therapy  Participation Level:  Active  Participation Quality:  Attentive  Affect:  Appropriate  Cognitive:  Alert  Insight:  Limited  Engagement in Therapy:  Engaged  Modes of Intervention:  Discussion, Exploration and Socialization  Summary of Progress/Problems: Today's group focused on the term Diagnosis.  Participants were asked to define the term, and then pronounce whether it is a negative, positive or neutral term.  Cathi was in a good mood during group.  She talked about her diagnosis and she feels it has been helpful in helping her understand some of her challenges, especially in relationships.  She is frustrated that those around her do not understand, and thinks she could just do better if she tried.  She talked about an appreciation for the fact that she is here and not at [facility] any longer, and also for her fellow patients that are supportive.  Roque Lias B 03/12/2013 , 1:43 PM

## 2013-03-12 NOTE — Progress Notes (Signed)
D Pt. Denies SI and HI,  No complaints of pain or discomf0rt.  A Writer offered support and encouragement.    R Pt. Remains safe on the unit.  Pt. Is fixated on discharge today and has request to talk with MD and case manager.  NP talked with pt. And assured her she is not ready for discharge at this time.  Pt. Is resting quietly

## 2013-03-13 MED ORDER — ACETAMINOPHEN 325 MG PO TABS
650.0000 mg | ORAL_TABLET | Freq: Four times a day (QID) | ORAL | Status: DC | PRN
  Administered 2013-03-13: 650 mg via ORAL
  Filled 2013-03-13: qty 2

## 2013-03-13 MED ORDER — GABAPENTIN 400 MG PO CAPS: 1200.0000 mg | ORAL_CAPSULE | Freq: Three times a day (TID) | ORAL | Status: DC

## 2013-03-13 MED ORDER — HYDROXYZINE HCL 50 MG PO TABS: 50.0000 mg | ORAL_TABLET | Freq: Three times a day (TID) | ORAL | Status: AC | PRN

## 2013-03-13 MED ORDER — TRIAMTERENE-HCTZ 37.5-25 MG PO CAPS: 2.0000 | ORAL_CAPSULE | ORAL | Status: DC

## 2013-03-13 MED ORDER — TRIHEXYPHENIDYL HCL 5 MG PO TABS: 5.0000 mg | ORAL_TABLET | Freq: Two times a day (BID) | ORAL | Status: DC

## 2013-03-13 MED ORDER — PRAVASTATIN SODIUM 40 MG PO TABS: 60.0000 mg | ORAL_TABLET | Freq: Every day | ORAL | Status: DC

## 2013-03-13 MED ORDER — IPRATROPIUM-ALBUTEROL 18-103 MCG/ACT IN AERO: 2.0000 | INHALATION_SPRAY | Freq: Four times a day (QID) | RESPIRATORY_TRACT | Status: DC | PRN

## 2013-03-13 MED ORDER — IPRATROPIUM-ALBUTEROL 0.5-2.5 (3) MG/3ML IN SOLN: 3.0000 mL | Freq: Four times a day (QID) | RESPIRATORY_TRACT | Status: DC | PRN

## 2013-03-13 MED ORDER — BUDESONIDE-FORMOTEROL FUMARATE 80-4.5 MCG/ACT IN AERO: 2.0000 | INHALATION_SPRAY | Freq: Two times a day (BID) | RESPIRATORY_TRACT | Status: DC

## 2013-03-13 MED ORDER — ARIPIPRAZOLE 10 MG PO TABS: 10.0000 mg | ORAL_TABLET | Freq: Two times a day (BID) | ORAL | Status: AC

## 2013-03-13 MED ORDER — RIVAROXABAN 20 MG PO TABS: 20.0000 mg | ORAL_TABLET | Freq: Every day | ORAL | Status: DC

## 2013-03-13 MED ORDER — CITALOPRAM HYDROBROMIDE 40 MG PO TABS: 40.0000 mg | ORAL_TABLET | Freq: Every day | ORAL | Status: AC

## 2013-03-13 MED ORDER — OMEPRAZOLE 20 MG PO CPDR: 20.0000 mg | DELAYED_RELEASE_CAPSULE | Freq: Every day | ORAL | Status: DC | PRN

## 2013-03-13 MED ORDER — FLUTICASONE PROPIONATE 50 MCG/ACT NA SUSP: 1.0000 | Freq: Every day | NASAL | Status: DC

## 2013-03-13 NOTE — [facility] Suicide Risk Assessment (Signed)
[place] INPATIENT:  Family/Significant Other Suicide Prevention Education  Suicide Prevention Education:  Education Completed; No one has been identified by the patient as the family member/significant other with whom the patient will be residing, and identified as the person(s) who will aid the patient in the event of a mental health crisis (suicidal ideations/suicide attempt).  With written consent from the patient, the family member/significant other has been provided the following suicide prevention education, prior to the and/or following the discharge of the patient.  The suicide prevention education provided includes the following:  Suicide risk factors  Suicide prevention and interventions  National Suicide Hotline telephone number  [facility] assessment telephone number  [facility] Emergency Assistance [place] and/or Residential Mobile Crisis Unit telephone number  Request made of family/significant other to:  Remove weapons (e.g., guns, rifles, knives), all items previously/currently identified as safety concern.    Remove drugs/medications (over-the-counter, prescriptions, illicit drugs), all items previously/currently identified as a safety concern.  The family member/significant other verbalizes understanding of the suicide prevention education information provided.  The family member/significant other agrees to remove the items of safety concern listed above. The patient did not endorse SI at the time of admission, nor did the patient c/o SI during the stay here.  SPE not required.   Judith Rice 03/13/2013, 10:11 AM

## 2013-03-13 NOTE — Tx Team (Signed)
  Interdisciplinary Treatment Plan Update   Date Reviewed:  03/13/2013  Time Reviewed:  10:14 AM  Progress in Treatment:   Attending groups: Yes Participating in groups: Yes Taking medication as prescribed: Yes  Tolerating medication: Yes Family/Significant other contact made: Yes  Patient understands diagnosis: Yes  Discussing patient identified problems/goals with staff: Yes Medical problems stabilized or resolved: Yes Denies suicidal/homicidal ideation: Yes Patient has not harmed self or others: Yes  For review of initial/current patient goals, please see plan of care.  Estimated Length of Stay:  D/C today  Reason for Continuation of Hospitalization:   New Problems/Goals identified:  N/A  Discharge Plan or Barriers:   return home, follow up outpt  Additional Comments:  Attendees:  Signature: Corena Pilgrim, MD 03/13/2013 10:14 AM   Signature: Ripley Fraise, LCSW 03/13/2013 10:14 AM  Signature: Elmarie Shiley, NP 03/13/2013 10:14 AM  Signature: Mayra Neer, RN 03/13/2013 10:14 AM  Signature: Darrol Angel, RN 03/13/2013 10:14 AM  Signature:  03/13/2013 10:14 AM  Signature:   03/13/2013 10:14 AM  Signature:    Signature:    Signature:    Signature:    Signature:    Signature:      Scribe for Treatment Team:   [employer], LCSW  03/13/2013 10:14 AM

## 2013-03-13 NOTE — Discharge Summary (Signed)
Physician Discharge Summary Note  Patient:  Judith Rice is an 38 y.o., female MRN:  [id number] DOB:  [birth year] Patient phone:  [phone] (home)  Patient address:   [address] [place] [address],  Total Time spent with patient: Greater than 30 minutes  Date of Admission:  03/08/2013 Date of Discharge: 03/13/13  Reason for Admission:  Mood stabilization  Discharge Diagnoses: Principal Problem:   Bipolar I disorder, most recent episode (or current) mixed, unspecified Active Problems:   Psychosis   Posttraumatic stress disorder   Psychiatric Specialty Exam: Physical Exam  Constitutional: She is oriented to person, place, and time. She appears well-developed.  HENT:  Head: Normocephalic.  Eyes: Pupils are equal, round, and reactive to light.  Neck: Normal range of motion.  Cardiovascular: Normal rate.   Respiratory: Effort normal.  GI: Soft.  Genitourinary:  Denies any problems in this area  Musculoskeletal: Normal range of motion.  Neurological: She is alert and oriented to person, place, and time.  Skin: Skin is warm and dry.  Psychiatric: Her speech is normal and behavior is normal. Judgment and thought content normal. Her mood appears not anxious. Her affect is not angry, not blunt, not labile and not inappropriate. Cognition and memory are normal. She does not exhibit a depressed mood.    Review of Systems  Constitutional: Negative.   HENT: Negative.   Eyes: Negative.   Respiratory: Negative.   Cardiovascular: Negative.   Gastrointestinal: Negative.   Genitourinary: Negative.   Musculoskeletal: Negative.   Skin: Negative.   Neurological: Negative.   Endo/Heme/Allergies: Negative.   Psychiatric/Behavioral: Positive for depression (Stabilized with medication prior to discharge), hallucinations (Hx of) and substance abuse (Polysubstance dependence). Negative for suicidal ideas and memory loss. The patient is nervous/anxious (Stabilized with medication prior to  discharge) and has insomnia (Stabilized with medication prior to discharge).     Blood pressure 130/88, pulse 80, temperature 97.3 F (36.3 C), temperature source Oral, resp. rate 16, height 5\' 4"  (1.626 m), weight 157.398 kg (347 lb).Body mass index is 59.53 kg/(m^2).  General Appearance: Casual and Fairly Groomed  Engineer, water::  Good  Speech:  Clear and Coherent  Volume:  Normal  Mood:  Stable  Affect:  Congruent  Thought Process:  Coherent and Logical  Orientation:  Full (Time, Place, and Person)  Thought Content:  Denies any psychotic symptoms  Suicidal Thoughts:  No  Homicidal Thoughts:  No  Memory:  Immediate;   Good Recent;   Good Remote;   Good  Judgement:  Other:  Improved  Insight:  Present  Psychomotor Activity:  Normal  Concentration:  Good  Recall:  Good  Fund of Knowledge:Fair  Language: Good  Akathisia:  No  Handed:  Right  AIMS (if indicated):     Assets:  Desire for Improvement  Sleep:  Number of Hours: 1.75    Past Psychiatric History: Diagnosis: Bipolar I disorder, most recent episode (or current) mixed, unspecified   Hospitalizations:  Outpatient Care:  Substance Abuse Care:  Self-Mutilation:  Suicidal Attempts:  Violent Behaviors:   Musculoskeletal: Strength & Muscle Tone: within normal limits Gait & Station: normal Patient leans: N/A  DSM5: Schizophrenia Disorders:  NA Obsessive-Compulsive Disorders:  NA Trauma-Stressor Disorders:  Posttraumatic Stress Disorder (309.81) Substance/Addictive Disorders:  Polysubstance dependence Depressive Disorders:  NA  Axis Diagnosis:   AXIS I:  Bipolar I disorder, most recent episode (or current) mixed, unspecified, Polysubstance dependence AXIS II:  Deferred AXIS III:   Past Medical History  Diagnosis  Date  . Nerve damage   . Sleep apnea   . COPD (chronic obstructive pulmonary disease)   . Asthma   . Hypoglycemia associated with diabetes   . Insomnia   . Gallstones   . Social anxiety disorder    . Cancer   . DVT (deep venous thrombosis)     LLE, 04/2012, Xarelto anticoagulation initiated  . Depression   . Suicidal ideations   . Baker's cyst     left   . Anxiety    AXIS IV:  Mental illness, chronic, Polysubstance dependenec AXIS V:  62  Level of Care:  OP  Hospital Course:  She reports that she was taking to [facility] emergency room few days ago by EMS due to mood lability, depression, anxiety, severe agitation and bizarre behavior. Patient lives in [place], [place] with her fiance, per [facility], bystanders saw pt walking down the road with a large bag of belongings ("waiting for them to come find me" in patient's word).   While a patient in this hospital, Judith Rice received medication management to re-stabilize her mood. She was medicated and discharged on Abilify 10 mg Q bedtime for mood control, Citalopram 40 mg daily for depression, Gabapentin 1,200 mg three times daily for substance withdrawal syndrome/anxiety/pain and Hydroxyzine 50 mg daily for anxiety/tension. Judith Rice was also enrolled and participated in group counseling sessions to learn copings to assist her cope better with her symptoms after discharge. She was resumed on her pertinent home medications for her other medical issues that she presented.   Judith Rice's mood has stabilized. This is evidenced by her reports of improved symptoms. She is currently being discharged to follow-up care at the [facility] psychiatric clinic in [place], [place]. She has been provided with all the necessary information need to make this appointment timely. Upon discharge, Judith Rice adamantly denies any suicidal, homicidal ideations, auditory, visual hallucinations, delusions and or paranoia. She was provided with 4 days worth supply samples of her [facility] discharge medications.  She left [facility] with all personal belongings in no apparent distress. Transportation per family.  Consults:  psychiatry  Significant Diagnostic Studies:  labs: Reviewed current  lab reports, no changes  Discharge Vitals:   Blood pressure 130/88, pulse 80, temperature 97.3 F (36.3 C), temperature source Oral, resp. rate 16, height 5\' 4"  (1.626 m), weight 157.398 kg (347 lb). Body mass index is 59.53 kg/(m^2). Lab Results:   No results found for this or any previous visit (from the past 72 hour(s)).  Physical Findings: AIMS: Facial and Oral Movements Muscles of Facial Expression: None, normal Lips and Perioral Area: None, normal Jaw: None, normal Tongue: None, normal,Extremity Movements Upper (arms, wrists, hands, fingers): None, normal Lower (legs, knees, ankles, toes): None, normal, Trunk Movements Neck, shoulders, hips: None, normal, Overall Severity Severity of abnormal movements (highest score from questions above): None, normal Incapacitation due to abnormal movements: None, normal Patient's awareness of abnormal movements (rate only patient's report): No Awareness, Dental Status Current problems with teeth and/or dentures?: No Does patient usually wear dentures?: No  CIWA:    COWS:     Psychiatric Specialty Exam: See Psychiatric Specialty Exam and Suicide Risk Assessment completed by Attending Physician prior to discharge.  Discharge destination:  Home  Is patient on multiple antipsychotic therapies at discharge:  No   Has Patient had three or more failed trials of antipsychotic monotherapy by history:  No  Recommended Plan for Multiple Antipsychotic Therapies: NA     Medication List  STOP taking these medications       clonazePAM 1 MG tablet  Commonly known as:  KLONOPIN     cyclobenzaprine 10 MG tablet  Commonly known as:  FLEXERIL     divalproex 250 MG DR tablet  Commonly known as:  DEPAKOTE     Echinacea 400 MG Caps     furosemide 40 MG tablet  Commonly known as:  LASIX     guaifenesin 400 MG Tabs tablet  Commonly known as:  HUMIBID E     naproxen 500 MG tablet  Commonly known as:  NAPROSYN     SUPER B COMPLEX & C  Tabs     venlafaxine 75 MG tablet  Commonly known as:  EFFEXOR      TAKE these medications     Indication   albuterol-ipratropium 18-103 MCG/ACT inhaler  Commonly known as:  COMBIVENT  Inhale 2 puffs into the lungs every 6 (six) hours as needed for wheezing.   Indication:  Wheezing, shortness of breath     ipratropium-albuterol 0.5-2.5 (3) MG/3ML Soln  Commonly known as:  DUONEB  Take 3 mLs by nebulization every 6 (six) hours as needed. For shortness of breath   Indication:  Shortness of breath     ARIPiprazole 10 MG tablet  Commonly known as:  ABILIFY  Take 1 tablet (10 mg total) by mouth 2 (two) times daily after a meal. For mood control   Indication:  Manic-Depression, Major Depressive Disorder     budesonide-formoterol 80-4.5 MCG/ACT inhaler  Commonly known as:  SYMBICORT  Inhale 2 puffs into the lungs 2 (two) times daily. For shortness of breath   Indication:  Asthma, Chronic Obstructive Lung Disease     citalopram 40 MG tablet  Commonly known as:  CELEXA  Take 1 tablet (40 mg total) by mouth daily. For depression   Indication:  Depression, Posttraumatic Stress Disorder     fluticasone 50 MCG/ACT nasal spray  Commonly known as:  FLONASE  Place 1 spray into both nostrils at bedtime. For allergies   Indication:  Perennial Rhinitis, Hayfever     gabapentin 400 MG capsule  Commonly known as:  NEURONTIN  Take 3 capsules (1,200 mg total) by mouth 3 (three) times daily. For mood stabilization   Indication:  Mood stabilization     hydrOXYzine 50 MG tablet  Commonly known as:  ATARAX/VISTARIL  Take 1 tablet (50 mg total) by mouth 3 (three) times daily as needed for anxiety or itching.   Indication:  Anxiety associated with Organic Disease, Tension     omeprazole 20 MG capsule  Commonly known as:  PRILOSEC  Take 1 capsule (20 mg total) by mouth daily as needed (heartburn).   Indication:  Gastroesophageal Reflux Disease with Current Symptoms     pravastatin 40 MG tablet   Commonly known as:  PRAVACHOL  Take 1.5 tablets (60 mg total) by mouth daily. For high cholesterol   Indication:  Inherited Heterozygous Hypercholesterolemia     Rivaroxaban 20 MG Tabs tablet  Commonly known as:  XARELTO  Take 1 tablet (20 mg total) by mouth daily. For prevention of blood clot   Indication:  Blood Clot in a Deep Vein     triamterene-hydrochlorothiazide 37.5-25 MG per capsule  Commonly known as:  DYAZIDE  Take 2 each (2 capsules total) by mouth every morning. For hypertension   Indication:  High Blood Pressure     trihexyphenidyl 5 MG tablet  Commonly known as:  ARTANE  Take  1 tablet (5 mg total) by mouth 2 (two) times daily with a meal. For prevention of drug induced involuntary movement   Indication:  Extrapyramidal Reaction caused by Medications       Follow-up Information   Follow up with [facility] On 03/15/2013. (Friday between 8 and 10AM for your hospital follow up appointment.  If you care to call [facility] yourself, they can be reached at [phone].)    Contact information:   [address] [place]  [phone]      Follow up Today.     Follow-up recommendations: Activity:  As tolerated Diet: As recommended by your primary care doctor. Keep all scheduled follow-up appointments as recommended.   Comments: Take all your medications as prescribed by your mental healthcare provider. Report any adverse effects and or reactions from your medicines to your outpatient provider promptly. Patient is instructed and cautioned to not engage in alcohol and or illegal drug use while on prescription medicines. In the event of worsening symptoms, patient is instructed to call the crisis hotline, 911 and or go to the nearest ED for appropriate evaluation and treatment of symptoms. Follow-up with your primary care provider for your other medical issues, concerns and or health care needs.     Total Discharge Time:  Greater than 30 minutes.  Signed: Encarnacion Slates,  [place], FNP 03/14/2013, 3:30 PM  Patient seen, evaluated and I agree with notes by Nurse Practitioner. Corena Pilgrim, MD

## 2013-03-13 NOTE — Progress Notes (Signed)
Discharge note: Pt denies SI/HI/AVH at time of discharge. Pt received both written and verbal discharged instructions. Pt agreed to f/u appt and med regimen. Pt verbalized understanding of discharge summary.  Pt received prescriptions and sample meds. Pt received all belongings from room and locker. Pt safely left [facility].

## 2013-03-13 NOTE — [facility] Suicide Risk Assessment (Signed)
Demographic Factors:  Caucasian, Low socioeconomic status, Unemployed and female  Total Time spent with patient: 20 minutes  Psychiatric Specialty Exam: Physical Exam  Psychiatric: Her speech is normal and behavior is normal. Judgment and thought content normal. Her mood appears anxious. Cognition and memory are normal.    Review of Systems  Constitutional: Negative.   HENT: Negative.   Eyes: Negative.   Respiratory: Negative.   Cardiovascular: Negative.   Gastrointestinal: Negative.   Genitourinary: Negative.   Musculoskeletal: Negative.   Skin: Negative.   Neurological: Negative.   Endo/Heme/Allergies: Negative.   Psychiatric/Behavioral: Negative.     Blood pressure 130/88, pulse 80, temperature 97.3 F (36.3 C), temperature source Oral, resp. rate 16, height 5\' 4"  (1.626 m), weight 157.398 kg (347 lb).Body mass index is 59.53 kg/(m^2).  General Appearance: Fairly Groomed  Engineer, water::  Good  Speech:  Clear and Coherent  Volume:  Normal  Mood:  Euthymic  Affect:  Appropriate and Full Range  Thought Process:  Circumstantial  Orientation:  Full (Time, Place, and Person)  Thought Content:  Negative  Suicidal Thoughts:  No  Homicidal Thoughts:  No  Memory:  Immediate;   Fair Recent;   Fair Remote;   Fair  Judgement:  marginal  Insight:  marginal  Psychomotor Activity:  Increased  Concentration:  Fair  Recall:  [employer] of Knowledge:Fair  Language: Fair  Akathisia:  No  Handed:  Right  AIMS (if indicated):     Assets:  Communication Skills  Sleep:  Number of Hours: 1.75    Musculoskeletal: Strength & Muscle Tone: within normal limits Gait & Station: normal Patient leans: N/A   Mental Status Per Nursing Assessment::   On Admission:     Current Mental Status by Physician: patient denies suicidal ideation, intent or plan  Loss Factors: Financial problems/change in socioeconomic status  Historical Factors: Impulsivity  Risk Reduction Factors:    Sense of responsibility to family, Living with another person, especially a relative and Positive social support  Continued Clinical Symptoms:  Alcohol/Substance Abuse/Dependencies  Cognitive Features That Contribute To Risk:  Closed-mindedness    Suicide Risk:  Minimal: No identifiable suicidal ideation.  Patients presenting with no risk factors but with morbid ruminations; may be classified as minimal risk based on the severity of the depressive symptoms  Discharge Diagnoses:   AXIS I:  Bipolar I disorder, most recent episode (or current) mixed, unspecified              Cannabis use disorder  AXIS II:  Deferred AXIS III:   Past Medical History  Diagnosis Date  . Nerve damage   . Sleep apnea   . COPD (chronic obstructive pulmonary disease)   . Asthma   . Hypoglycemia associated with diabetes   . Gallstones   . Cancer   . DVT (deep venous thrombosis)     LLE, 04/2012, Xarelto anticoagulation initiated  . Baker's cyst     left    AXIS IV:  economic problems, other psychosocial or environmental problems and problems related to social environment AXIS V:  61-70 mild symptoms  Plan Of Care/Follow-up recommendations:  Activity:  as tolerated Diet:  healthy Tests:  routine Other:  patient to keep her after care appointment  Is patient on multiple antipsychotic therapies at discharge:  No   Has Patient had three or more failed trials of antipsychotic monotherapy by history:  No  Recommended Plan for Multiple Antipsychotic Therapies: NA    Kiona Blume,MD 03/13/2013,  9:34 AM

## 2013-03-13 NOTE — Progress Notes (Signed)
[facility] Adult Case Management Discharge Plan :  Will you be returning to the same living situation after discharge: Yes,  home At discharge, do you have transportation home?:Yes,  family Do you have the ability to pay for your medications:Yes,  [facility]  Release of information consent forms completed and in the chart;  Patient's signature needed at discharge.  Patient to Follow up at: Follow-up Information   Follow up with [facility] On 03/15/2013. (Friday between 8 and 10AM for your hospital follow up appointment.  If you care to call [facility] yourself, they can be reached at [phone].)    Contact information:   [address] [place]  [phone]      Follow up Today.      Patient denies SI/HI:   Yes,  yes    Safety Planning and Suicide Prevention discussed:  Yes,  yes  Trish Mage 03/13/2013, 10:12 AM

## 2013-03-18 ENCOUNTER — Telehealth: Payer: Self-pay | Admitting: Family Medicine

## 2013-03-18 NOTE — Telephone Encounter (Signed)
Judith Rice patient wants to speak with you or Dr.newton

## 2013-03-18 NOTE — Progress Notes (Signed)
Patient Discharge Instructions:  After Visit Summary (AVS):   Faxed to:  03/18/13 Discharge Summary Note:   Faxed to:  03/18/13 Psychiatric Admission Assessment Note:   Faxed to:  03/18/13 Suicide Risk Assessment - Discharge Assessment:   Faxed to:  03/18/13 Faxed/Sent to the Next Level Care provider:  03/18/13 Faxed to [facility], 03/18/2013, 4:25 PM

## 2013-03-20 NOTE — Telephone Encounter (Signed)
What are your thoughts on this?

## 2013-03-20 NOTE — Telephone Encounter (Signed)
Pt can be seen here for NON PSYCHIATRIC issues only in my opinion with pt signing medical compliance contract, still, case will need to be discussed with practice leadership before any final decisions are made.  Let pt know that we are in the process of reviewing her case, especially since I am not here on a consistent basis.

## 2013-03-21 NOTE — Telephone Encounter (Signed)
Pt aware that we will discuss her case further and let her know if we will be able to continue to see her. She expressed that she may find another provider.

## 2013-04-30 ENCOUNTER — Telehealth: Payer: Self-pay | Admitting: Family Medicine

## 2013-04-30 MED ORDER — PRAVASTATIN SODIUM 40 MG PO TABS: 60.0000 mg | ORAL_TABLET | Freq: Every day | ORAL | Status: DC

## 2013-04-30 MED ORDER — OMEPRAZOLE 20 MG PO CPDR: 20.0000 mg | DELAYED_RELEASE_CAPSULE | Freq: Every day | ORAL | Status: AC | PRN

## 2013-04-30 MED ORDER — FLUTICASONE PROPIONATE 50 MCG/ACT NA SUSP: 1.0000 | Freq: Every day | NASAL | Status: DC

## 2013-04-30 MED ORDER — IPRATROPIUM-ALBUTEROL 18-103 MCG/ACT IN AERO: 2.0000 | INHALATION_SPRAY | Freq: Four times a day (QID) | RESPIRATORY_TRACT | Status: DC | PRN

## 2013-04-30 MED ORDER — BUDESONIDE-FORMOTEROL FUMARATE 80-4.5 MCG/ACT IN AERO: 2.0000 | INHALATION_SPRAY | Freq: Two times a day (BID) | RESPIRATORY_TRACT | Status: AC

## 2013-04-30 NOTE — Telephone Encounter (Signed)
She can be seen for medical problems but not psychiatric or pain management issues.  First available with Dr. Ernestina Patches is 05/13/13.

## 2013-04-30 NOTE — Telephone Encounter (Signed)
Thank you for following up on this [name].

## 2013-04-30 NOTE — Telephone Encounter (Signed)
Patient sounded very clear on the phone. She is seeing [facility] and they are managing her psychiatric medications and [place].  She is aware that we will only see her for medical problems.  Refills given to last until appt on 05/13/13.

## 2013-05-05 ENCOUNTER — Emergency Department (HOSPITAL_COMMUNITY): Payer: Medicaid Other

## 2013-05-05 ENCOUNTER — Encounter (HOSPITAL_COMMUNITY): Payer: Self-pay | Admitting: Emergency Medicine

## 2013-05-05 ENCOUNTER — Inpatient Hospital Stay (HOSPITAL_COMMUNITY)
Admission: EM | Admit: 2013-05-05 | Discharge: 2013-05-08 | DRG: 394 | Disposition: A | Discharge status: Home / Self Care | Payer: Medicaid Other | Attending: General Surgery | Admitting: General Surgery

## 2013-05-05 DIAGNOSIS — R001 Bradycardia, unspecified: Secondary | ICD-10-CM | POA: Diagnosis not present

## 2013-05-05 DIAGNOSIS — K661 Hemoperitoneum: Secondary | ICD-10-CM | POA: Diagnosis present

## 2013-05-05 DIAGNOSIS — R55 Syncope and collapse: Secondary | ICD-10-CM

## 2013-05-05 DIAGNOSIS — J449 Chronic obstructive pulmonary disease, unspecified: Secondary | ICD-10-CM | POA: Diagnosis present

## 2013-05-05 DIAGNOSIS — G4700 Insomnia, unspecified: Secondary | ICD-10-CM | POA: Diagnosis present

## 2013-05-05 DIAGNOSIS — R0902 Hypoxemia: Secondary | ICD-10-CM | POA: Diagnosis not present

## 2013-05-05 DIAGNOSIS — I493 Ventricular premature depolarization: Secondary | ICD-10-CM | POA: Diagnosis not present

## 2013-05-05 DIAGNOSIS — Y92410 Unspecified street and highway as the place of occurrence of the external cause: Secondary | ICD-10-CM

## 2013-05-05 DIAGNOSIS — R109 Unspecified abdominal pain: Secondary | ICD-10-CM

## 2013-05-05 DIAGNOSIS — J4489 Other specified chronic obstructive pulmonary disease: Secondary | ICD-10-CM | POA: Diagnosis present

## 2013-05-05 DIAGNOSIS — S20219A Contusion of unspecified front wall of thorax, initial encounter: Secondary | ICD-10-CM | POA: Diagnosis present

## 2013-05-05 DIAGNOSIS — Z6841 Body Mass Index (BMI) 40.0 and over, adult: Secondary | ICD-10-CM

## 2013-05-05 DIAGNOSIS — F17200 Nicotine dependence, unspecified, uncomplicated: Secondary | ICD-10-CM | POA: Diagnosis present

## 2013-05-05 DIAGNOSIS — F4310 Post-traumatic stress disorder, unspecified: Secondary | ICD-10-CM | POA: Diagnosis present

## 2013-05-05 DIAGNOSIS — I498 Other specified cardiac arrhythmias: Secondary | ICD-10-CM | POA: Diagnosis not present

## 2013-05-05 DIAGNOSIS — G4730 Sleep apnea, unspecified: Secondary | ICD-10-CM | POA: Diagnosis present

## 2013-05-05 DIAGNOSIS — F3160 Bipolar disorder, current episode mixed, unspecified: Secondary | ICD-10-CM | POA: Diagnosis present

## 2013-05-05 DIAGNOSIS — G4733 Obstructive sleep apnea (adult) (pediatric): Secondary | ICD-10-CM | POA: Diagnosis present

## 2013-05-05 DIAGNOSIS — S3681XA Injury of peritoneum, initial encounter: Principal | ICD-10-CM | POA: Diagnosis present

## 2013-05-05 DIAGNOSIS — Z86718 Personal history of other venous thrombosis and embolism: Secondary | ICD-10-CM

## 2013-05-05 DIAGNOSIS — Z7901 Long term (current) use of anticoagulants: Secondary | ICD-10-CM

## 2013-05-05 DIAGNOSIS — G47419 Narcolepsy without cataplexy: Secondary | ICD-10-CM | POA: Diagnosis present

## 2013-05-05 DIAGNOSIS — R079 Chest pain, unspecified: Secondary | ICD-10-CM | POA: Diagnosis not present

## 2013-05-05 DIAGNOSIS — I82409 Acute embolism and thrombosis of unspecified deep veins of unspecified lower extremity: Secondary | ICD-10-CM | POA: Diagnosis present

## 2013-05-05 DIAGNOSIS — F1210 Cannabis abuse, uncomplicated: Secondary | ICD-10-CM | POA: Diagnosis present

## 2013-05-05 DIAGNOSIS — F411 Generalized anxiety disorder: Secondary | ICD-10-CM | POA: Diagnosis present

## 2013-05-05 DIAGNOSIS — Z79899 Other long term (current) drug therapy: Secondary | ICD-10-CM

## 2013-05-05 LAB — COMPREHENSIVE METABOLIC PANEL
ALBUMIN: 3.1 g/dL — AB (ref 3.5–5.2)
ALT: 14 U/L (ref 0–35)
AST: 31 U/L (ref 0–37)
Alkaline Phosphatase: 83 U/L (ref 39–117)
BUN: 13 mg/dL (ref 6–23)
CALCIUM: 8.9 mg/dL (ref 8.4–10.5)
CHLORIDE: 100 meq/L (ref 96–112)
CO2: 23 mEq/L (ref 19–32)
CREATININE: 0.82 mg/dL (ref 0.50–1.10)
GFR calc Af Amer: >90 mL/min (ref >90)
GFR calc non Af Amer: >90 mL/min (ref >90)
Glucose, Bld: 167 mg/dL — ABNORMAL HIGH (ref 70–99)
Potassium: 4.4 mEq/L (ref 3.7–5.3)
Sodium: 139 mEq/L (ref 137–147)
TOTAL PROTEIN: 6.6 g/dL (ref 6.0–8.3)

## 2013-05-05 LAB — CBC
HCT: 44.5 % (ref 36.0–46.0)
HEMOGLOBIN: 15.1 g/dL — AB (ref 12.0–15.0)
MCH: 31.9 pg (ref 26.0–34.0)
MCHC: 33.9 g/dL (ref 30.0–36.0)
MCV: 93.9 fL (ref 78.0–100.0)
PLATELETS: 225 10*3/uL (ref 150–400)
RBC: 4.74 MIL/uL (ref 3.87–5.11)
RDW: 15.5 % (ref 11.5–15.5)
WBC: 25.8 10*3/uL — AB (ref 4.0–10.5)

## 2013-05-05 LAB — I-STAT CHEM 8, ED
BUN: 12 mg/dL (ref 6–23)
CHLORIDE: 102 meq/L (ref 96–112)
Calcium, Ion: 1.08 mmol/L — ABNORMAL LOW (ref 1.12–1.23)
Creatinine, Ser: 0.9 mg/dL (ref 0.50–1.10)
GLUCOSE: 155 mg/dL — AB (ref 70–99)
HCT: 46 % (ref 36.0–46.0)
Hemoglobin: 15.6 g/dL — ABNORMAL HIGH (ref 12.0–15.0)
POTASSIUM: 4 meq/L (ref 3.7–5.3)
Sodium: 139 mEq/L (ref 137–147)
TCO2: 25 mmol/L (ref 0–100)

## 2013-05-05 LAB — HEMOGLOBIN AND HEMATOCRIT, BLOOD
HCT: 43.2 % (ref 36.0–46.0)
Hemoglobin: 14.3 g/dL (ref 12.0–15.0)

## 2013-05-05 LAB — TYPE AND SCREEN
ABO/RH(D): O POS
Antibody Screen: NEGATIVE

## 2013-05-05 LAB — SALICYLATE LEVEL: Salicylate Lvl: <2 mg/dL — ABNORMAL LOW (ref 2.8–20.0)

## 2013-05-05 LAB — PROTIME-INR
INR: 1.49 (ref 0.00–1.49)
INR: 1.14 (ref 0.00–1.49)
Prothrombin Time: 17.6 seconds — ABNORMAL HIGH (ref 11.6–15.2)
Prothrombin Time: 14.4 s (ref 11.6–15.2)

## 2013-05-05 LAB — LACTIC ACID, PLASMA: Lactic Acid, Venous: 2.7 mmol/L — ABNORMAL HIGH (ref 0.5–2.2)

## 2013-05-05 LAB — ABO/RH: ABO/RH(D): O POS

## 2013-05-05 LAB — I-STAT TROPONIN, ED: Troponin i, poc: 0 ng/mL (ref 0.00–0.08)

## 2013-05-05 LAB — ACETAMINOPHEN LEVEL: Acetaminophen (Tylenol), Serum: <15 ug/mL (ref 10–30)

## 2013-05-05 LAB — APTT: aPTT: 63 seconds — ABNORMAL HIGH (ref 24–37)

## 2013-05-05 LAB — ETHANOL

## 2013-05-05 LAB — MRSA PCR SCREENING: MRSA BY PCR: NEGATIVE

## 2013-05-05 MED ORDER — IOHEXOL 300 MG/ML  SOLN
100.0000 mL | Freq: Once | INTRAMUSCULAR | Status: AC | PRN
  Administered 2013-05-05: 100 mL via INTRAVENOUS

## 2013-05-05 MED ORDER — FENTANYL CITRATE 0.05 MG/ML IJ SOLN
50.0000 ug | Freq: Once | INTRAMUSCULAR | Status: AC
  Administered 2013-05-05: 50 ug via INTRAVENOUS
  Filled 2013-05-05: qty 2

## 2013-05-05 MED ORDER — DEXTROSE-NACL 5-0.9 % IV SOLN
INTRAVENOUS | Status: DC
  Administered 2013-05-05: 17:00:00 via INTRAVENOUS

## 2013-05-05 MED ORDER — ONDANSETRON HCL 4 MG/2ML IJ SOLN
INTRAMUSCULAR | Status: AC
  Filled 2013-05-05: qty 2

## 2013-05-05 MED ORDER — WHITE PETROLATUM GEL
Status: AC
  Administered 2013-05-05: 18:00:00
  Filled 2013-05-05: qty 5

## 2013-05-05 MED ORDER — FEIBA NF IV SOLR
2500.0000 [IU] | Status: DC
  Filled 2013-05-05: qty 2500

## 2013-05-05 MED ORDER — BUDESONIDE-FORMOTEROL FUMARATE 80-4.5 MCG/ACT IN AERO
2.0000 | INHALATION_SPRAY | Freq: Two times a day (BID) | RESPIRATORY_TRACT | Status: DC
  Administered 2013-05-05 – 2013-05-08 (×2): 2 via RESPIRATORY_TRACT
  Filled 2013-05-05 (×2): qty 6.9

## 2013-05-05 MED ORDER — TETANUS-DIPHTH-ACELL PERTUSSIS 5-2.5-18.5 LF-MCG/0.5 IM SUSP
0.5000 mL | Freq: Once | INTRAMUSCULAR | Status: AC
  Administered 2013-05-05: 0.5 mL via INTRAMUSCULAR
  Filled 2013-05-05: qty 0.5

## 2013-05-05 MED ORDER — ONDANSETRON HCL 4 MG/2ML IJ SOLN
4.0000 mg | Freq: Four times a day (QID) | INTRAMUSCULAR | Status: DC | PRN
  Administered 2013-05-06: 4 mg via INTRAVENOUS
  Filled 2013-05-05: qty 2

## 2013-05-05 MED ORDER — ONDANSETRON HCL 4 MG PO TABS
4.0000 mg | ORAL_TABLET | Freq: Four times a day (QID) | ORAL | Status: DC | PRN
  Administered 2013-05-07: 4 mg via ORAL
  Filled 2013-05-05: qty 1

## 2013-05-05 MED ORDER — ONDANSETRON HCL 4 MG/2ML IJ SOLN
4.0000 mg | Freq: Once | INTRAMUSCULAR | Status: AC
  Administered 2013-05-05: 4 mg via INTRAVENOUS

## 2013-05-05 MED ORDER — PANTOPRAZOLE SODIUM 40 MG IV SOLR: 40.0000 mg | Freq: Every day | INTRAVENOUS | Status: DC

## 2013-05-05 MED ORDER — SODIUM CHLORIDE 0.9 % IV BOLUS (SEPSIS)
1000.0000 mL | Freq: Once | INTRAVENOUS | Status: AC
  Administered 2013-05-05: 1000 mL via INTRAVENOUS

## 2013-05-05 MED ORDER — IPRATROPIUM-ALBUTEROL 0.5-2.5 (3) MG/3ML IN SOLN: 3.0000 mL | Freq: Four times a day (QID) | RESPIRATORY_TRACT | Status: DC | PRN

## 2013-05-05 MED ORDER — HYDROMORPHONE HCL PF 1 MG/ML IJ SOLN
2.0000 mg | INTRAMUSCULAR | Status: DC | PRN
  Administered 2013-05-05 – 2013-05-06 (×5): 2 mg via INTRAVENOUS
  Filled 2013-05-05 (×5): qty 2

## 2013-05-05 MED ORDER — FENTANYL CITRATE 0.05 MG/ML IJ SOLN
100.0000 ug | Freq: Once | INTRAMUSCULAR | Status: AC
  Administered 2013-05-05: 100 ug via INTRAVENOUS
  Filled 2013-05-05: qty 2

## 2013-05-05 MED ORDER — FEIBA NF IV SOLR
2562.0000 [IU] | INTRAVENOUS | Status: AC
  Administered 2013-05-05: 2562 [IU] via INTRAVENOUS
  Filled 2013-05-05: qty 2562

## 2013-05-05 MED ORDER — PANTOPRAZOLE SODIUM 40 MG PO TBEC
40.0000 mg | DELAYED_RELEASE_TABLET | Freq: Every day | ORAL | Status: DC
  Administered 2013-05-05 – 2013-05-08 (×4): 40 mg via ORAL
  Filled 2013-05-05 (×4): qty 1

## 2013-05-05 NOTE — ED Notes (Signed)
Oxygen Saturation at 89% on RA. Patient placed on 3L [place] and is at 91%.

## 2013-05-05 NOTE — ED Notes (Signed)
C-collar removed and pt sat up in bed per trauma MD. Family remains at the bedside.

## 2013-05-05 NOTE — ED Notes (Signed)
Trauma Surgeon at the bedside.

## 2013-05-05 NOTE — ED Notes (Signed)
Trauma at the bedside.
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
## 2013-05-05 NOTE — ED Notes (Signed)
Warm Blankets placed on patient.

## 2013-05-05 NOTE — Progress Notes (Signed)
Chaplain responded to page for level II and spoke with EMS on scene. Pt is alert and responsive.  There was no need for spiritual care at this time.

## 2013-05-05 NOTE — ED Notes (Signed)
Xray at the bedside.

## 2013-05-05 NOTE — Progress Notes (Signed)
I was notified as per the nurse that the patient was up and moving around room. The patient in order for strict bed rest. In discussing this with the patient and her states that the patient was not concerned with the fact. According to the nurse the patient was tachycardic during this time. I tried to the nurse at the patient is to be on strict bedrest. The patient was threatening to leave AMA but was talked and to staying by her boyfriend.
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
## 2013-05-05 NOTE — ED Notes (Signed)
C-Collar placed on patient and taken off of LSB at this time with Dr. Christy Gentles at the bedside.

## 2013-05-06 ENCOUNTER — Inpatient Hospital Stay (HOSPITAL_COMMUNITY): Payer: Medicaid Other

## 2013-05-06 ENCOUNTER — Encounter (HOSPITAL_COMMUNITY): Payer: Self-pay | Admitting: Cardiology

## 2013-05-06 DIAGNOSIS — R0902 Hypoxemia: Secondary | ICD-10-CM | POA: Diagnosis not present

## 2013-05-06 DIAGNOSIS — S20219A Contusion of unspecified front wall of thorax, initial encounter: Secondary | ICD-10-CM | POA: Diagnosis not present

## 2013-05-06 DIAGNOSIS — T07XXXA Unspecified multiple injuries, initial encounter: Secondary | ICD-10-CM

## 2013-05-06 DIAGNOSIS — R001 Bradycardia, unspecified: Secondary | ICD-10-CM | POA: Diagnosis not present

## 2013-05-06 DIAGNOSIS — I4949 Other premature depolarization: Secondary | ICD-10-CM

## 2013-05-06 DIAGNOSIS — Z7901 Long term (current) use of anticoagulants: Secondary | ICD-10-CM

## 2013-05-06 DIAGNOSIS — K661 Hemoperitoneum: Secondary | ICD-10-CM | POA: Diagnosis present

## 2013-05-06 DIAGNOSIS — I493 Ventricular premature depolarization: Secondary | ICD-10-CM | POA: Diagnosis not present

## 2013-05-06 DIAGNOSIS — R079 Chest pain, unspecified: Secondary | ICD-10-CM

## 2013-05-06 DIAGNOSIS — I498 Other specified cardiac arrhythmias: Secondary | ICD-10-CM

## 2013-05-06 LAB — BLOOD GAS, ARTERIAL
Acid-base deficit: 0.8 mmol/L (ref 0.0–2.0)
Bicarbonate: 25.4 mEq/L — ABNORMAL HIGH (ref 20.0–24.0)
DRAWN BY: 275531
FIO2: 0.4 %
O2 Saturation: 96.7 %
PCO2 ART: 58.5 mmHg — AB (ref 35.0–45.0)
Patient temperature: 98.6
TCO2: 27.2 mmol/L (ref 0–100)
pH, Arterial: 7.261 — ABNORMAL LOW (ref 7.350–7.450)
pO2, Arterial: 88.7 mmHg (ref 80.0–100.0)

## 2013-05-06 LAB — BASIC METABOLIC PANEL
BUN: 15 mg/dL (ref 6–23)
CALCIUM: 8.7 mg/dL (ref 8.4–10.5)
CO2: 20 mEq/L (ref 19–32)
Chloride: 103 mEq/L (ref 96–112)
Creatinine, Ser: 0.91 mg/dL (ref 0.50–1.10)
GFR calc Af Amer: >90 mL/min (ref >90)
GFR, EST NON AFRICAN AMERICAN: 81 mL/min — AB (ref >90)
GLUCOSE: 133 mg/dL — AB (ref 70–99)
Potassium: 5.1 mEq/L (ref 3.7–5.3)
Sodium: 137 mEq/L (ref 137–147)

## 2013-05-06 LAB — CBC
HEMATOCRIT: 40.6 % (ref 36.0–46.0)
HEMOGLOBIN: 13.4 g/dL (ref 12.0–15.0)
MCH: 30.9 pg (ref 26.0–34.0)
MCHC: 33 g/dL (ref 30.0–36.0)
MCV: 93.5 fL (ref 78.0–100.0)
Platelets: 243 10*3/uL (ref 150–400)
RBC: 4.34 MIL/uL (ref 3.87–5.11)
RDW: 15.5 % (ref 11.5–15.5)
WBC: 16.6 10*3/uL — ABNORMAL HIGH (ref 4.0–10.5)

## 2013-05-06 LAB — PROTIME-INR
INR: 1.09 (ref 0.00–1.49)
Prothrombin Time: 13.9 s (ref 11.6–15.2)

## 2013-05-06 LAB — TROPONIN I
Troponin I: <0.3 ng/mL (ref <0.30)
Troponin I: <0.3 ng/mL (ref <0.30)

## 2013-05-06 LAB — HEMOGLOBIN AND HEMATOCRIT, BLOOD
HCT: 37.3 % (ref 36.0–46.0)
HCT: 38.6 % (ref 36.0–46.0)
Hemoglobin: 12 g/dL (ref 12.0–15.0)
Hemoglobin: 12.5 g/dL (ref 12.0–15.0)

## 2013-05-06 MED ORDER — OXYCODONE HCL 5 MG PO TABS
10.0000 mg | ORAL_TABLET | ORAL | Status: DC | PRN
  Administered 2013-05-06: 10 mg via ORAL
  Administered 2013-05-07 – 2013-05-08 (×3): 20 mg via ORAL
  Filled 2013-05-06 (×2): qty 2
  Filled 2013-05-06 (×3): qty 4

## 2013-05-06 MED ORDER — CITALOPRAM HYDROBROMIDE 40 MG PO TABS
40.0000 mg | ORAL_TABLET | Freq: Every day | ORAL | Status: DC
  Administered 2013-05-06 – 2013-05-08 (×3): 40 mg via ORAL
  Filled 2013-05-06 (×3): qty 1

## 2013-05-06 MED ORDER — NAPROXEN 500 MG PO TABS
500.0000 mg | ORAL_TABLET | Freq: Two times a day (BID) | ORAL | Status: DC
  Administered 2013-05-06 – 2013-05-08 (×5): 500 mg via ORAL
  Filled 2013-05-06 (×8): qty 1

## 2013-05-06 MED ORDER — GUAIFENESIN ER 600 MG PO TB12
1200.0000 mg | ORAL_TABLET | Freq: Two times a day (BID) | ORAL | Status: DC
  Administered 2013-05-06 – 2013-05-08 (×5): 1200 mg via ORAL
  Filled 2013-05-06 (×7): qty 2

## 2013-05-06 MED ORDER — DOCUSATE SODIUM 100 MG PO CAPS
100.0000 mg | ORAL_CAPSULE | Freq: Two times a day (BID) | ORAL | Status: DC
  Administered 2013-05-06 – 2013-05-08 (×4): 100 mg via ORAL
  Filled 2013-05-06 (×4): qty 1

## 2013-05-06 MED ORDER — TRIAMTERENE-HCTZ 75-50 MG PO TABS
1.0000 | ORAL_TABLET | Freq: Every day | ORAL | Status: DC
  Administered 2013-05-06 – 2013-05-08 (×3): 1 via ORAL
  Filled 2013-05-06 (×3): qty 1

## 2013-05-06 MED ORDER — POLYETHYLENE GLYCOL 3350 17 G PO PACK
17.0000 g | PACK | Freq: Every day | ORAL | Status: DC
  Administered 2013-05-06 – 2013-05-08 (×2): 17 g via ORAL
  Filled 2013-05-06 (×3): qty 1

## 2013-05-06 MED ORDER — FLUTICASONE PROPIONATE 50 MCG/ACT NA SUSP
1.0000 | Freq: Every day | NASAL | Status: DC
  Administered 2013-05-06: 1 via NASAL
  Filled 2013-05-06 (×2): qty 16

## 2013-05-06 MED ORDER — TRIAMTERENE-HCTZ 37.5-25 MG PO CAPS
2.0000 | ORAL_CAPSULE | Freq: Every day | ORAL | Status: DC
  Filled 2013-05-06: qty 2

## 2013-05-06 MED ORDER — HYDROXYZINE HCL 25 MG PO TABS
50.0000 mg | ORAL_TABLET | Freq: Three times a day (TID) | ORAL | Status: DC | PRN
  Administered 2013-05-07: 50 mg via ORAL
  Filled 2013-05-06: qty 2
  Filled 2013-05-06: qty 1

## 2013-05-06 MED ORDER — TRAMADOL HCL 50 MG PO TABS
100.0000 mg | ORAL_TABLET | Freq: Four times a day (QID) | ORAL | Status: DC
  Administered 2013-05-06 – 2013-05-08 (×10): 100 mg via ORAL
  Filled 2013-05-06 (×10): qty 2

## 2013-05-06 MED ORDER — ALPRAZOLAM 0.5 MG PO TABS
0.5000 mg | ORAL_TABLET | Freq: Three times a day (TID) | ORAL | Status: DC | PRN
  Administered 2013-05-06 – 2013-05-08 (×5): 0.5 mg via ORAL
  Filled 2013-05-06 (×5): qty 1

## 2013-05-06 MED ORDER — HYDROMORPHONE HCL PF 1 MG/ML IJ SOLN: 1.0000 mg | INTRAMUSCULAR | Status: DC | PRN

## 2013-05-06 MED ORDER — NICOTINE 21 MG/24HR TD PT24
21.0000 mg | MEDICATED_PATCH | Freq: Every day | TRANSDERMAL | Status: DC
  Administered 2013-05-06 – 2013-05-08 (×3): 21 mg via TRANSDERMAL
  Filled 2013-05-06 (×3): qty 1

## 2013-05-06 MED ORDER — ARIPIPRAZOLE 10 MG PO TABS
10.0000 mg | ORAL_TABLET | Freq: Two times a day (BID) | ORAL | Status: DC
  Administered 2013-05-06 – 2013-05-08 (×5): 10 mg via ORAL
  Filled 2013-05-06 (×8): qty 1

## 2013-05-06 MED ORDER — TRIHEXYPHENIDYL HCL 5 MG PO TABS
5.0000 mg | ORAL_TABLET | Freq: Two times a day (BID) | ORAL | Status: DC
  Administered 2013-05-06 – 2013-05-08 (×5): 5 mg via ORAL
  Filled 2013-05-06 (×8): qty 1

## 2013-05-06 MED ORDER — POTASSIUM CHLORIDE IN NACL 20-0.45 MEQ/L-% IV SOLN
INTRAVENOUS | Status: DC
  Filled 2013-05-06 (×2): qty 1000

## 2013-05-06 MED ORDER — IPRATROPIUM-ALBUTEROL 0.5-2.5 (3) MG/3ML IN SOLN
3.0000 mL | RESPIRATORY_TRACT | Status: DC
  Administered 2013-05-06 – 2013-05-07 (×4): 3 mL via RESPIRATORY_TRACT
  Filled 2013-05-06 (×4): qty 3

## 2013-05-06 MED ORDER — SODIUM CHLORIDE 0.45 % IV SOLN
INTRAVENOUS | Status: DC
  Administered 2013-05-06: 17:00:00 via INTRAVENOUS

## 2013-05-06 MED ORDER — SIMVASTATIN 20 MG PO TABS
20.0000 mg | ORAL_TABLET | Freq: Every day | ORAL | Status: DC
  Administered 2013-05-06 – 2013-05-07 (×2): 20 mg via ORAL
  Filled 2013-05-06 (×3): qty 1

## 2013-05-06 MED ORDER — GABAPENTIN 400 MG PO CAPS
1200.0000 mg | ORAL_CAPSULE | Freq: Three times a day (TID) | ORAL | Status: DC
  Administered 2013-05-06 – 2013-05-08 (×7): 1200 mg via ORAL
  Filled 2013-05-06 (×10): qty 3

## 2013-05-06 NOTE — Progress Notes (Signed)
Pt sats dropped 85% on 3 liter venti mask-went into check and readjusted mask-sats back up to 94%. While reviewing monitor for O2 sats, noticed that HR dropped to 47 and showed vent bigeminy. Aroused  Pt again and HR jumped to 89 with PVC's. Then back down to 50's and vent bigeminy. Pt lethargic after  having PRN dose of oxy 10 mg this morning.  Pt is frequently falling asleep and then wakes back up quickly Then back to sleep. Paged Jacqulynn Cadet, PA-notified of findings and new orders provided. Notified RRT to come by and review pt status.  RT by to draw ABG and O2 is increased to 8L/40% per RT  Performed a 12 lead and rhythm strip to provide for Cards and Attending.  Cardiac labs ordered awaiting for draw.  Also awaiting to take pt to radiology to for 2 view of chest.   Will continue to monitor.     ABG results: pH 7.26, CO2 58.5, O2 88.7, Bicarb 25.4.
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
## 2013-05-06 NOTE — Significant Event (Signed)
Rapid Response Event Note  Overview: Time Called: 1110 Arrival Time: 1125 Event Type: Cardiac  Initial Focused Assessment:  Called by Rn to evaluate patient, who dropped sats and was noted to have bigeminy on monitor.  Upon my arrival to patients bedside, RN in room performing EKG.  Patient is lethargic and drifts off, however easily arousable.  Patient now on venti mask @40 %.     Interventions:  EKG done, ABG done results reviewed.  RT to give a breathing treatment now.  MD paged and updated   Event Summary:   at      at          [facility]

## 2013-05-06 NOTE — Progress Notes (Signed)
CRITICAL VALUE ALERT  Critical value received:  Ph 7.26, Co2 58.5  Date of notification:  05/06/2013   Time of notification: 1152  Critical value read back: yes  Nurse who received alert:  Langley Gauss, RN informed primary RN Jarrett Soho  MD notified (1st page):  Hilbert Odor, PA  Time of first page:  1153  MD notified (2nd page):  Time of second page:  Responding MD:    Time MD responded:
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
## 2013-05-06 NOTE — Progress Notes (Signed)
Patient's oxygen saturations drop promptly when oxygen mask is off.  No IS device in the room.  Keep in SDU.  This patient has been seen and I agree with the findings and treatment plan.  Kathryne Eriksson. Dahlia Bailiff, MD, [place] [phone] (pager) [phone] (direct pager) Trauma Surgeon
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
## 2013-05-07 DIAGNOSIS — G4730 Sleep apnea, unspecified: Secondary | ICD-10-CM | POA: Diagnosis present

## 2013-05-07 DIAGNOSIS — S069X9A Unspecified intracranial injury with loss of consciousness of unspecified duration, initial encounter: Secondary | ICD-10-CM

## 2013-05-07 DIAGNOSIS — I517 Cardiomegaly: Secondary | ICD-10-CM

## 2013-05-07 DIAGNOSIS — IMO0002 Reserved for concepts with insufficient information to code with codable children: Secondary | ICD-10-CM

## 2013-05-07 DIAGNOSIS — S069XAA Unspecified intracranial injury with loss of consciousness status unknown, initial encounter: Secondary | ICD-10-CM

## 2013-05-07 DIAGNOSIS — R55 Syncope and collapse: Secondary | ICD-10-CM

## 2013-05-07 LAB — CDS SEROLOGY

## 2013-05-07 LAB — HEMOGLOBIN AND HEMATOCRIT, BLOOD
HCT: 32.4 % — ABNORMAL LOW (ref 36.0–46.0)
HEMOGLOBIN: 10.6 g/dL — AB (ref 12.0–15.0)

## 2013-05-07 LAB — TROPONIN I: Troponin I: <0.3 ng/mL (ref <0.30)

## 2013-05-07 MED ORDER — IPRATROPIUM-ALBUTEROL 0.5-2.5 (3) MG/3ML IN SOLN
3.0000 mL | Freq: Four times a day (QID) | RESPIRATORY_TRACT | Status: DC
  Administered 2013-05-07 – 2013-05-08 (×5): 3 mL via RESPIRATORY_TRACT
  Filled 2013-05-07 (×6): qty 3
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
## 2013-05-07 NOTE — Progress Notes (Signed)
UR completed.  Liany Mumpower, RN BSN [facility] CCM Trauma/Neuro ICU Case Manager [phone]  

## 2013-05-07 NOTE — Progress Notes (Signed)
Pt IV leaking and bleeding at site. Tubing detached upon entering. IV dressing changed and secured. IV working. Will continue to monitor. Judith Rice
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
## 2013-05-07 NOTE — Progress Notes (Signed)
  Echocardiogram 2D Echocardiogram has been performed.  Donata Clay 05/07/2013, 12:03 PM

## 2013-05-08 LAB — CBC
HCT: 34.1 % — ABNORMAL LOW (ref 36.0–46.0)
Hemoglobin: 11 g/dL — ABNORMAL LOW (ref 12.0–15.0)
MCH: 30.5 pg (ref 26.0–34.0)
MCHC: 32.3 g/dL (ref 30.0–36.0)
MCV: 94.5 fL (ref 78.0–100.0)
Platelets: 221 10*3/uL (ref 150–400)
RBC: 3.61 MIL/uL — ABNORMAL LOW (ref 3.87–5.11)
RDW: 15.6 % — ABNORMAL HIGH (ref 11.5–15.5)
WBC: 15.5 10*3/uL — AB (ref 4.0–10.5)

## 2013-05-08 MED ORDER — OXYCODONE HCL 5 MG PO TABS: 5.0000 mg | ORAL_TABLET | Freq: Four times a day (QID) | ORAL | Status: DC | PRN

## 2013-05-08 MED ORDER — HYDROCODONE-ACETAMINOPHEN 5-325 MG PO TABS: 1.0000 | ORAL_TABLET | Freq: Four times a day (QID) | ORAL | Status: DC | PRN
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
## 2013-05-08 NOTE — Progress Notes (Signed)
1100am:  Spoke with patient about DME needs for discharge home. She stated that she didn't know what she needed and that I should wait until her family arrived and speak with them.  1330pm:  Pt introduced me to woman in the room as her mother in law and that I should talk to her about DME needs. Explained that patient was requesting a CPap machine for discharge and that I had the order for it and would submit it to [place] that provided the last machine patient had.  I had called them earlier and they were able to tell me that it was 2011 when she received the last one.  I explained to the patient that according to [employer], her sleep study was still current but that typically Medicaid would only pay for one machine every 5 years and that she may be called upon to explain the circumstances around why she no longer has the previous machine.  Pt and mother-in-law stated understanding of this.  Address and phone number on facesheet confirmed as correct.  [phone], fax sent to [employer] to start this process.

## 2013-05-08 NOTE — Progress Notes (Signed)
Patient restless, walking around room. Pt appears lethargic.  Instructed patient to return to bed for her safety and to prevent falling. Patient refused to stay in bed, stated that she is fine and that she is fully awake and steady on her feet. She said that she is hot and is trying to cool herself down. Offered ice packs to cool her down, adjusted room temp to the coolest level. Will continue to monitor. KYoung RN

## 2013-05-08 NOTE — Progress Notes (Signed)
Discharge home. Home discharge instruction given, patient very upset with the restriction given to her, inform family that patient is not allowed to drive until cleared by MD.
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
## 2013-05-08 NOTE — Progress Notes (Signed)
  Subjective: She states she is ready to go home. She still has some sob at times. She feels much better  Objective: Vital signs in last 24 hours: Temp:  [98.3 F (36.8 C)-98.9 F (37.2 C)] 98.9 F (37.2 C) (04/15 0600) Pulse Rate:  [80-99] 91 (04/15 1028) Resp:  [14-20] 20 (04/15 1028) BP: (108-133)/(53-84) 133/65 mmHg (04/15 1028) SpO2:  [87 %-100 %] 87 % (04/15 1028) Last BM Date: 05/05/13  Intake/Output from previous day: 04/14 0701 - 04/15 0700 In: 2200 [P.O.:1600; I.V.:600] Out: -  Intake/Output this shift: Total I/O In: 360 [P.O.:360] Out: -   Resp: clear to auscultation bilaterally Cardio: regular rate and rhythm GI: soft, mild tenderness at bruised sites  Lab Results:   Recent Labs  05/06/13 0052  05/07/13 0110 05/08/13 0500  WBC 16.6*  --   --  15.5*  HGB 13.4  < > 10.6* 11.0*  HCT 40.6  < > 32.4* 34.1*  PLT 243  --   --  221  < > = values in this interval not displayed. BMET  Recent Labs  05/05/13 1301 05/05/13 1313 05/06/13 0052  NA 139 139 137  K 4.4 4.0 5.1  CL 100 102 103  CO2 23  --  20  GLUCOSE 167* 155* 133*  BUN 13 12 15   CREATININE 0.82 0.90 0.91  CALCIUM 8.9  --  8.7   PT/INR  Recent Labs  05/05/13 2030 05/06/13 1010  LABPROT 14.4 13.9  INR 1.14 1.09   ABG  Recent Labs  05/06/13 1140  PHART 7.261*  HCO3 25.4*    Studies/Results: Dg Chest 2 View  05/06/2013   CLINICAL DATA:  Shortness of breath.  Hypoxia.  EXAM: CHEST  2 VIEW  COMPARISON:  Chest x-ray 05/05/2013.  FINDINGS: Lung volumes are low. Extensive bibasilar opacities are favored to reflect areas of subsegmental atelectasis, although underlying airspace consolidation from aspiration or developing infection is difficult to exclude. No pleural effusions. No evidence of pulmonary edema. Heart size is normal. Mediastinal contours are unremarkable.  IMPRESSION: 1. Low lung volumes with extensive bibasilar areas of subsegmental atelectasis. 2. Clinical correlation is  recommended to exclude signs are symptoms of recent aspiration or developing infection throughout the lung bases bilaterally (not strongly favored given the low lung volumes).   Electronically Signed   By: Vinnie Langton M.D.   On: 05/06/2013 17:53    Anti-infectives: Anti-infectives   None      Assessment/Plan: s/p * No surgery found * Discharge She says she has medical doctor to follow up with for CPAP She is advised to not drive but she says she will if she feels like it  LOS: 3 days    Luella Cook III 05/08/2013

## 2013-05-10 ENCOUNTER — Ambulatory Visit (INDEPENDENT_AMBULATORY_CARE_PROVIDER_SITE_OTHER): Payer: Medicaid Other

## 2013-05-10 ENCOUNTER — Ambulatory Visit (INDEPENDENT_AMBULATORY_CARE_PROVIDER_SITE_OTHER): Payer: Medicaid Other | Admitting: Family Medicine

## 2013-05-10 ENCOUNTER — Encounter: Payer: Self-pay | Admitting: Family Medicine

## 2013-05-10 VITALS — BP 132/88 | HR 84 | Temp 98.6°F | Ht 65.0 in | Wt 339.8 lb

## 2013-05-10 DIAGNOSIS — R300 Dysuria: Secondary | ICD-10-CM

## 2013-05-10 DIAGNOSIS — G4733 Obstructive sleep apnea (adult) (pediatric): Secondary | ICD-10-CM

## 2013-05-10 DIAGNOSIS — R0789 Other chest pain: Secondary | ICD-10-CM

## 2013-05-10 DIAGNOSIS — R0902 Hypoxemia: Secondary | ICD-10-CM

## 2013-05-10 DIAGNOSIS — R5381 Other malaise: Secondary | ICD-10-CM

## 2013-05-10 DIAGNOSIS — R071 Chest pain on breathing: Secondary | ICD-10-CM

## 2013-05-10 DIAGNOSIS — R079 Chest pain, unspecified: Secondary | ICD-10-CM

## 2013-05-10 DIAGNOSIS — R5383 Other fatigue: Secondary | ICD-10-CM

## 2013-05-10 DIAGNOSIS — R109 Unspecified abdominal pain: Secondary | ICD-10-CM

## 2013-05-10 DIAGNOSIS — N390 Urinary tract infection, site not specified: Secondary | ICD-10-CM

## 2013-05-10 DIAGNOSIS — J209 Acute bronchitis, unspecified: Secondary | ICD-10-CM

## 2013-05-10 DIAGNOSIS — I82409 Acute embolism and thrombosis of unspecified deep veins of unspecified lower extremity: Secondary | ICD-10-CM

## 2013-05-10 DIAGNOSIS — J449 Chronic obstructive pulmonary disease, unspecified: Secondary | ICD-10-CM | POA: Insufficient documentation

## 2013-05-10 DIAGNOSIS — K5900 Constipation, unspecified: Secondary | ICD-10-CM

## 2013-05-10 LAB — POCT CBC
Granulocyte percent: 61.3 %G (ref 37–80)
HCT, POC: 34.7 % — AB (ref 37.7–47.9)
Hemoglobin: 10.9 g/dL — AB (ref 12.2–16.2)
Lymph, poc: 4.1 — AB (ref 0.6–3.4)
MCH, POC: 28.9 pg (ref 27–31.2)
MCHC: 31.5 g/dL — AB (ref 31.8–35.4)
MCV: 91.7 fL (ref 80–97)
MPV: 7.3 fL (ref 0–99.8)
POC Granulocyte: 7.4 — AB (ref 2–6.9)
POC LYMPH PERCENT: 33.5 %L (ref 10–50)
Platelet Count, POC: 297 10*3/uL (ref 142–424)
RBC: 3.8 M/uL — AB (ref 4.04–5.48)
RDW, POC: 15.6 %
WBC: 12.1 10*3/uL — AB (ref 4.6–10.2)

## 2013-05-10 LAB — POCT URINALYSIS DIPSTICK
Bilirubin, UA: NEGATIVE
Glucose, UA: NEGATIVE
Ketones, UA: NEGATIVE
Nitrite, UA: NEGATIVE
Protein, UA: NEGATIVE
Spec Grav, UA: <=1.005
Urobilinogen, UA: NEGATIVE
pH, UA: 8

## 2013-05-10 LAB — POCT UA - MICROSCOPIC ONLY
Bacteria, U Microscopic: NEGATIVE
Casts, Ur, LPF, POC: NEGATIVE
Crystals, Ur, HPF, POC: NEGATIVE
Mucus, UA: NEGATIVE
Yeast, UA: NEGATIVE

## 2013-05-10 MED ORDER — POLYETHYLENE GLYCOL 3350 17 GM/SCOOP PO POWD: 17.0000 g | Freq: Two times a day (BID) | ORAL | Status: DC | PRN

## 2013-05-10 MED ORDER — CIPROFLOXACIN HCL 500 MG PO TABS: 500.0000 mg | ORAL_TABLET | Freq: Two times a day (BID) | ORAL | Status: DC

## 2013-05-10 MED ORDER — ALBUTEROL SULFATE (2.5 MG/3ML) 0.083% IN NEBU: 2.5000 mg | INHALATION_SOLUTION | Freq: Four times a day (QID) | RESPIRATORY_TRACT | Status: DC | PRN

## 2013-05-10 MED ORDER — OXYCODONE HCL 5 MG PO TABS: 5.0000 mg | ORAL_TABLET | Freq: Four times a day (QID) | ORAL | Status: DC | PRN

## 2013-05-10 MED ORDER — DOCUSATE SODIUM 100 MG PO CAPS: 100.0000 mg | ORAL_CAPSULE | Freq: Two times a day (BID) | ORAL | Status: DC

## 2013-05-10 MED ORDER — KETOROLAC TROMETHAMINE 60 MG/2ML IM SOLN
60.0000 mg | Freq: Once | INTRAMUSCULAR | Status: AC
Start: 2013-05-10 — End: 2013-05-10
  Administered 2013-05-10: 60 mg via INTRAMUSCULAR
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
## 2013-05-11 LAB — CMP14+EGFR
ALT: 17 IU/L (ref 0–32)
AST: 26 IU/L (ref 0–40)
Albumin/Globulin Ratio: 1.5 (ref 1.1–2.5)
Albumin: 3.6 g/dL (ref 3.5–5.5)
Alkaline Phosphatase: 82 IU/L (ref 39–117)
BUN/Creatinine Ratio: 15 (ref 8–20)
BUN: 10 mg/dL (ref 6–20)
CO2: 29 mmol/L (ref 18–29)
Calcium: 8.8 mg/dL (ref 8.7–10.2)
Chloride: 94 mmol/L — ABNORMAL LOW (ref 97–108)
Creatinine, Ser: 0.68 mg/dL (ref 0.57–1.00)
GFR calc Af Amer: 131 mL/min/{1.73_m2} (ref >59)
GFR calc non Af Amer: 114 mL/min/{1.73_m2} (ref >59)
Globulin, Total: 2.4 g/dL (ref 1.5–4.5)
Glucose: 89 mg/dL (ref 65–99)
Potassium: 4.1 mmol/L (ref 3.5–5.2)
Sodium: 140 mmol/L (ref 134–144)
Total Bilirubin: 0.9 mg/dL (ref 0.0–1.2)
Total Protein: 6 g/dL (ref 6.0–8.5)

## 2013-05-11 LAB — URINE CULTURE

## 2013-05-13 ENCOUNTER — Encounter: Payer: Self-pay | Admitting: Family Medicine

## 2013-05-13 ENCOUNTER — Encounter (HOSPITAL_COMMUNITY): Payer: Self-pay | Admitting: Emergency Medicine

## 2013-05-13 ENCOUNTER — Emergency Department (HOSPITAL_COMMUNITY): Payer: Medicaid Other

## 2013-05-13 ENCOUNTER — Emergency Department (HOSPITAL_COMMUNITY)
Admission: EM | Admit: 2013-05-13 | Discharge: 2013-05-13 | Disposition: A | Discharge status: Home / Self Care | Payer: Medicaid Other | Attending: Emergency Medicine | Admitting: Emergency Medicine

## 2013-05-13 ENCOUNTER — Ambulatory Visit (INDEPENDENT_AMBULATORY_CARE_PROVIDER_SITE_OTHER): Payer: Medicaid Other | Admitting: Family Medicine

## 2013-05-13 VITALS — BP 110/68 | HR 79 | Temp 98.9°F | Ht 65.0 in | Wt 345.0 lb

## 2013-05-13 DIAGNOSIS — E1169 Type 2 diabetes mellitus with other specified complication: Secondary | ICD-10-CM | POA: Insufficient documentation

## 2013-05-13 DIAGNOSIS — J449 Chronic obstructive pulmonary disease, unspecified: Secondary | ICD-10-CM | POA: Insufficient documentation

## 2013-05-13 DIAGNOSIS — K661 Hemoperitoneum: Secondary | ICD-10-CM

## 2013-05-13 DIAGNOSIS — F3289 Other specified depressive episodes: Secondary | ICD-10-CM | POA: Insufficient documentation

## 2013-05-13 DIAGNOSIS — S2239XA Fracture of one rib, unspecified side, initial encounter for closed fracture: Secondary | ICD-10-CM | POA: Insufficient documentation

## 2013-05-13 DIAGNOSIS — Z79899 Other long term (current) drug therapy: Secondary | ICD-10-CM | POA: Insufficient documentation

## 2013-05-13 DIAGNOSIS — IMO0002 Reserved for concepts with insufficient information to code with codable children: Secondary | ICD-10-CM | POA: Insufficient documentation

## 2013-05-13 DIAGNOSIS — J209 Acute bronchitis, unspecified: Secondary | ICD-10-CM

## 2013-05-13 DIAGNOSIS — Z86718 Personal history of other venous thrombosis and embolism: Secondary | ICD-10-CM | POA: Insufficient documentation

## 2013-05-13 DIAGNOSIS — Z90710 Acquired absence of both cervix and uterus: Secondary | ICD-10-CM | POA: Insufficient documentation

## 2013-05-13 DIAGNOSIS — Y92410 Unspecified street and highway as the place of occurrence of the external cause: Secondary | ICD-10-CM | POA: Insufficient documentation

## 2013-05-13 DIAGNOSIS — R0902 Hypoxemia: Secondary | ICD-10-CM

## 2013-05-13 DIAGNOSIS — Z8739 Personal history of other diseases of the musculoskeletal system and connective tissue: Secondary | ICD-10-CM | POA: Insufficient documentation

## 2013-05-13 DIAGNOSIS — F17200 Nicotine dependence, unspecified, uncomplicated: Secondary | ICD-10-CM | POA: Insufficient documentation

## 2013-05-13 DIAGNOSIS — Y939 Activity, unspecified: Secondary | ICD-10-CM | POA: Insufficient documentation

## 2013-05-13 DIAGNOSIS — G4700 Insomnia, unspecified: Secondary | ICD-10-CM | POA: Insufficient documentation

## 2013-05-13 DIAGNOSIS — Z9889 Other specified postprocedural states: Secondary | ICD-10-CM | POA: Insufficient documentation

## 2013-05-13 DIAGNOSIS — F329 Major depressive disorder, single episode, unspecified: Secondary | ICD-10-CM | POA: Insufficient documentation

## 2013-05-13 DIAGNOSIS — R109 Unspecified abdominal pain: Secondary | ICD-10-CM | POA: Insufficient documentation

## 2013-05-13 DIAGNOSIS — G8911 Acute pain due to trauma: Secondary | ICD-10-CM | POA: Insufficient documentation

## 2013-05-13 DIAGNOSIS — Z792 Long term (current) use of antibiotics: Secondary | ICD-10-CM | POA: Insufficient documentation

## 2013-05-13 DIAGNOSIS — R339 Retention of urine, unspecified: Secondary | ICD-10-CM | POA: Insufficient documentation

## 2013-05-13 DIAGNOSIS — Z8673 Personal history of transient ischemic attack (TIA), and cerebral infarction without residual deficits: Secondary | ICD-10-CM | POA: Insufficient documentation

## 2013-05-13 DIAGNOSIS — Z859 Personal history of malignant neoplasm, unspecified: Secondary | ICD-10-CM | POA: Insufficient documentation

## 2013-05-13 DIAGNOSIS — F4010 Social phobia, unspecified: Secondary | ICD-10-CM | POA: Insufficient documentation

## 2013-05-13 DIAGNOSIS — J4489 Other specified chronic obstructive pulmonary disease: Secondary | ICD-10-CM | POA: Insufficient documentation

## 2013-05-13 DIAGNOSIS — R319 Hematuria, unspecified: Secondary | ICD-10-CM

## 2013-05-13 LAB — URINALYSIS, ROUTINE W REFLEX MICROSCOPIC
GLUCOSE, UA: NEGATIVE mg/dL
Ketones, ur: NEGATIVE mg/dL
Leukocytes, UA: NEGATIVE
Nitrite: NEGATIVE
PH: 5 (ref 5.0–8.0)
Protein, ur: 30 mg/dL — AB
SPECIFIC GRAVITY, URINE: 1.026 (ref 1.005–1.030)
Urobilinogen, UA: 1 mg/dL (ref 0.0–1.0)

## 2013-05-13 LAB — CBC WITH DIFFERENTIAL/PLATELET
BASOS PCT: 0 % (ref 0–1)
Basophils Absolute: 0.1 10*3/uL (ref 0.0–0.1)
Eosinophils Absolute: 0.4 10*3/uL (ref 0.0–0.7)
Eosinophils Relative: 3 % (ref 0–5)
HCT: 35.5 % — ABNORMAL LOW (ref 36.0–46.0)
HEMOGLOBIN: 11.4 g/dL — AB (ref 12.0–15.0)
LYMPHS ABS: 3.8 10*3/uL (ref 0.7–4.0)
Lymphocytes Relative: 28 % (ref 12–46)
MCH: 31 pg (ref 26.0–34.0)
MCHC: 32.1 g/dL (ref 30.0–36.0)
MCV: 96.5 fL (ref 78.0–100.0)
Monocytes Absolute: 1.1 10*3/uL — ABNORMAL HIGH (ref 0.1–1.0)
Monocytes Relative: 8 % (ref 3–12)
NEUTROS PCT: 61 % (ref 43–77)
Neutro Abs: 8.4 10*3/uL — ABNORMAL HIGH (ref 1.7–7.7)
Platelets: 304 10*3/uL (ref 150–400)
RBC: 3.68 MIL/uL — AB (ref 3.87–5.11)
RDW: 17.8 % — ABNORMAL HIGH (ref 11.5–15.5)
WBC: 13.7 10*3/uL — AB (ref 4.0–10.5)

## 2013-05-13 LAB — BASIC METABOLIC PANEL
BUN: 11 mg/dL (ref 6–23)
CHLORIDE: 98 meq/L (ref 96–112)
CO2: 29 mEq/L (ref 19–32)
Calcium: 9 mg/dL (ref 8.4–10.5)
Creatinine, Ser: 0.84 mg/dL (ref 0.50–1.10)
GFR calc non Af Amer: 89 mL/min — ABNORMAL LOW (ref >90)
Glucose, Bld: 102 mg/dL — ABNORMAL HIGH (ref 70–99)
POTASSIUM: 3.8 meq/L (ref 3.7–5.3)
Sodium: 139 mEq/L (ref 137–147)

## 2013-05-13 LAB — URINE MICROSCOPIC-ADD ON

## 2013-05-13 LAB — I-STAT CG4 LACTIC ACID, ED: Lactic Acid, Venous: 1.28 mmol/L (ref 0.5–2.2)

## 2013-05-13 LAB — I-STAT TROPONIN, ED: TROPONIN I, POC: 0 ng/mL (ref 0.00–0.08)

## 2013-05-13 MED ORDER — SODIUM CHLORIDE 0.9 % IV BOLUS (SEPSIS)
1000.0000 mL | Freq: Once | INTRAVENOUS | Status: AC
  Administered 2013-05-13: 1000 mL via INTRAVENOUS

## 2013-05-13 MED ORDER — GABAPENTIN 400 MG PO CAPS: 1200.0000 mg | ORAL_CAPSULE | Freq: Three times a day (TID) | ORAL | Status: DC

## 2013-05-13 MED ORDER — OXYCODONE-ACETAMINOPHEN 5-325 MG PO TABS: 1.0000 | ORAL_TABLET | Freq: Four times a day (QID) | ORAL | Status: DC | PRN

## 2013-05-13 MED ORDER — HYDROMORPHONE HCL PF 1 MG/ML IJ SOLN
1.0000 mg | Freq: Once | INTRAMUSCULAR | Status: AC
  Administered 2013-05-13: 1 mg via INTRAVENOUS
  Filled 2013-05-13: qty 1

## 2013-05-13 MED ORDER — ALBUTEROL SULFATE (2.5 MG/3ML) 0.083% IN NEBU: 2.5000 mg | INHALATION_SOLUTION | Freq: Four times a day (QID) | RESPIRATORY_TRACT | Status: AC | PRN

## 2013-05-13 MED ORDER — IPRATROPIUM-ALBUTEROL 18-103 MCG/ACT IN AERO: 2.0000 | INHALATION_SPRAY | Freq: Four times a day (QID) | RESPIRATORY_TRACT | Status: DC | PRN

## 2013-05-13 MED ORDER — IOHEXOL 300 MG/ML  SOLN
100.0000 mL | Freq: Once | INTRAMUSCULAR | Status: AC | PRN
  Administered 2013-05-13: 100 mL via INTRAVENOUS

## 2013-05-13 NOTE — ED Notes (Signed)
Patient asked for and received a coke.
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
## 2013-05-13 NOTE — ED Notes (Signed)
Patient transported to X-ray 

## 2013-05-13 NOTE — ED Notes (Signed)
Discharge and follow up instructions reviewed. Pt verbalized understanding.  

## 2013-05-13 NOTE — ED Notes (Signed)
NOTIFIED DR. Mingo Amber OF PATIENTS LAB RESULTS OF CG4+ LACTIC ACVID ,18:00 PM ,05/13/2013.

## 2013-05-13 NOTE — Discharge Instructions (Signed)
Abdominal Pain, Adult °Many things can cause abdominal pain. Usually, abdominal pain is not caused by a disease and will improve without treatment. It can often be observed and treated at home. Your health care provider will do a physical exam and possibly order blood tests and X-rays to help determine the seriousness of your pain. However, in many cases, more time must pass before a clear cause of the pain can be found. Before that point, your health care provider may not know if you need more testing or further treatment. °HOME CARE INSTRUCTIONS  °Monitor your abdominal pain for any changes. The following actions may help to alleviate any discomfort you are experiencing: °· Only take over-the-counter or prescription medicines as directed by your health care provider. °· Do not take laxatives unless directed to do so by your health care provider. °· Try a clear liquid diet (broth, tea, or water) as directed by your health care provider. Slowly move to a bland diet as tolerated. °SEEK MEDICAL CARE IF: °· You have unexplained abdominal pain. °· You have abdominal pain associated with nausea or diarrhea. °· You have pain when you urinate or have a bowel movement. °· You experience abdominal pain that wakes you in the night. °· You have abdominal pain that is worsened or improved by eating food. °· You have abdominal pain that is worsened with eating fatty foods. °SEEK IMMEDIATE MEDICAL CARE IF:  °· Your pain does not go away within 2 hours. °· You have a fever. °· You keep throwing up (vomiting). °· Your pain is felt only in portions of the abdomen, such as the right side or the left lower portion of the abdomen. °· You pass bloody or black tarry stools. °MAKE SURE YOU: °· Understand these instructions.   °· Will watch your condition.   °· Will get help right away if you are not doing well or get worse.   °Document Released: 10/20/2004 Document Revised: 10/31/2012 Document Reviewed: 09/19/2012 °[employer]® Patient  Information ©2014 [employer]. ° °

## 2013-05-13 NOTE — Addendum Note (Signed)
Addended by: Earlene Plater on: 05/13/2013 11:29 AM   Modules accepted: Orders
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
## 2013-05-13 NOTE — ED Notes (Signed)
MD at Bedside.
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
## 2013-05-15 ENCOUNTER — Telehealth (HOSPITAL_COMMUNITY): Payer: Self-pay

## 2013-05-15 ENCOUNTER — Telehealth: Payer: Self-pay | Admitting: Family Medicine

## 2013-05-15 NOTE — Telephone Encounter (Signed)
Appt given for tomorrow with bill

## 2013-05-15 NOTE — Telephone Encounter (Signed)
[facility] called to say they were unable to provide the patient's CPAP because of her insurance. They said they informed the patient and she would need to contact her insurance provider to find a company who could provide it.

## 2013-05-16 ENCOUNTER — Encounter: Payer: Self-pay | Admitting: Family Medicine

## 2013-05-16 ENCOUNTER — Ambulatory Visit (INDEPENDENT_AMBULATORY_CARE_PROVIDER_SITE_OTHER): Payer: Medicaid Other | Admitting: Family Medicine

## 2013-05-16 VITALS — BP 134/82 | HR 83 | Temp 97.0°F | Wt 350.8 lb

## 2013-05-16 DIAGNOSIS — R109 Unspecified abdominal pain: Secondary | ICD-10-CM

## 2013-05-16 DIAGNOSIS — R071 Chest pain on breathing: Secondary | ICD-10-CM

## 2013-05-16 DIAGNOSIS — G4733 Obstructive sleep apnea (adult) (pediatric): Secondary | ICD-10-CM

## 2013-05-16 DIAGNOSIS — R0789 Other chest pain: Secondary | ICD-10-CM

## 2013-05-16 MED ORDER — KETOROLAC TROMETHAMINE 60 MG/2ML IM SOLN
60.0000 mg | Freq: Once | INTRAMUSCULAR | Status: AC
  Administered 2013-05-16: 60 mg via INTRAMUSCULAR

## 2013-05-16 MED ORDER — OXYCODONE-ACETAMINOPHEN 5-325 MG PO TABS: 1.0000 | ORAL_TABLET | Freq: Four times a day (QID) | ORAL | Status: DC | PRN
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
## 2013-05-20 ENCOUNTER — Telehealth: Payer: Self-pay | Admitting: Family Medicine

## 2013-05-20 NOTE — Telephone Encounter (Signed)
appt given for wed with bill

## 2013-05-21 ENCOUNTER — Telehealth: Payer: Self-pay | Admitting: Family Medicine

## 2013-05-21 NOTE — Telephone Encounter (Signed)
Patient just wanted to tell Judith Rice that she wants an xray done at her appt with bill oxford tomorrow. i told her they could do one.

## 2013-05-22 ENCOUNTER — Ambulatory Visit (INDEPENDENT_AMBULATORY_CARE_PROVIDER_SITE_OTHER): Payer: Medicaid Other | Admitting: Family Medicine

## 2013-05-22 ENCOUNTER — Ambulatory Visit (INDEPENDENT_AMBULATORY_CARE_PROVIDER_SITE_OTHER): Payer: Medicaid Other

## 2013-05-22 VITALS — BP 128/70 | HR 86 | Temp 98.9°F | Wt 352.4 lb

## 2013-05-22 DIAGNOSIS — R609 Edema, unspecified: Secondary | ICD-10-CM

## 2013-05-22 DIAGNOSIS — R0789 Other chest pain: Secondary | ICD-10-CM

## 2013-05-22 DIAGNOSIS — K5900 Constipation, unspecified: Secondary | ICD-10-CM

## 2013-05-22 DIAGNOSIS — R071 Chest pain on breathing: Secondary | ICD-10-CM

## 2013-05-22 DIAGNOSIS — I82409 Acute embolism and thrombosis of unspecified deep veins of unspecified lower extremity: Secondary | ICD-10-CM

## 2013-05-22 DIAGNOSIS — R0602 Shortness of breath: Secondary | ICD-10-CM

## 2013-05-22 DIAGNOSIS — R110 Nausea: Secondary | ICD-10-CM

## 2013-05-22 LAB — POCT CBC
Granulocyte percent: 64.1 %G (ref 37–80)
HCT, POC: 39.6 % (ref 37.7–47.9)
Hemoglobin: 12.3 g/dL (ref 12.2–16.2)
Lymph, poc: 3.3 (ref 0.6–3.4)
MCH, POC: 29.1 pg (ref 27–31.2)
MCHC: 31.1 g/dL — AB (ref 31.8–35.4)
MCV: 93.6 fL (ref 80–97)
MPV: 7 fL (ref 0–99.8)
POC Granulocyte: 7.2 — AB (ref 2–6.9)
POC LYMPH PERCENT: 29.8 %L (ref 10–50)
Platelet Count, POC: 304 10*3/uL (ref 142–424)
RBC: 4.2 M/uL (ref 4.04–5.48)
RDW, POC: 19.1 %
WBC: 11.2 10*3/uL — AB (ref 4.6–10.2)

## 2013-05-22 MED ORDER — FUROSEMIDE 20 MG PO TABS: 20.0000 mg | ORAL_TABLET | Freq: Every day | ORAL | Status: DC

## 2013-05-22 MED ORDER — FLUTICASONE PROPIONATE 50 MCG/ACT NA SUSP: 1.0000 | Freq: Every day | NASAL | Status: AC

## 2013-05-22 MED ORDER — OXYCODONE-ACETAMINOPHEN 5-325 MG PO TABS: 1.0000 | ORAL_TABLET | Freq: Four times a day (QID) | ORAL | Status: DC | PRN

## 2013-05-22 MED ORDER — ONDANSETRON 8 MG PO TBDP: 8.0000 mg | ORAL_TABLET | Freq: Three times a day (TID) | ORAL | Status: DC | PRN

## 2013-05-22 MED ORDER — LUBIPROSTONE 24 MCG PO CAPS: 24.0000 ug | ORAL_CAPSULE | Freq: Two times a day (BID) | ORAL | Status: AC

## 2013-05-22 MED ORDER — CIPROFLOXACIN HCL 500 MG PO TABS: 500.0000 mg | ORAL_TABLET | Freq: Two times a day (BID) | ORAL | Status: DC

## 2013-05-22 MED ORDER — IPRATROPIUM-ALBUTEROL 18-103 MCG/ACT IN AERO: 2.0000 | INHALATION_SPRAY | Freq: Four times a day (QID) | RESPIRATORY_TRACT | Status: AC | PRN
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
## 2013-05-27 ENCOUNTER — Ambulatory Visit: Payer: Self-pay | Admitting: Physical Therapy

## 2013-05-28 ENCOUNTER — Telehealth: Payer: Self-pay | Admitting: *Deleted

## 2013-05-28 ENCOUNTER — Other Ambulatory Visit: Payer: Self-pay | Admitting: Family Medicine

## 2013-05-28 DIAGNOSIS — R0789 Other chest pain: Secondary | ICD-10-CM

## 2013-05-28 MED ORDER — OXYCODONE-ACETAMINOPHEN 5-325 MG PO TABS: 1.0000 | ORAL_TABLET | Freq: Four times a day (QID) | ORAL | Status: DC | PRN

## 2013-05-28 NOTE — Telephone Encounter (Signed)
Pt called stating she did not get her RX for Percoset at last visit Bill okayed refill RX printed and to the front for pt pick up

## 2013-05-28 NOTE — Telephone Encounter (Signed)
Pt notified rx ready for pick up.

## 2013-05-29 ENCOUNTER — Ambulatory Visit: Payer: Medicaid Other | Admitting: Physical Therapy

## 2013-06-06 ENCOUNTER — Telehealth: Payer: Self-pay | Admitting: Family Medicine

## 2013-06-06 NOTE — Telephone Encounter (Signed)
appointment made 5/15 at 10:30 with [facility]

## 2013-06-07 ENCOUNTER — Ambulatory Visit (INDEPENDENT_AMBULATORY_CARE_PROVIDER_SITE_OTHER): Payer: Medicaid Other | Admitting: Family Medicine

## 2013-06-07 ENCOUNTER — Ambulatory Visit (INDEPENDENT_AMBULATORY_CARE_PROVIDER_SITE_OTHER): Payer: Medicaid Other

## 2013-06-07 ENCOUNTER — Encounter: Payer: Self-pay | Admitting: Family Medicine

## 2013-06-07 VITALS — BP 127/71 | HR 71 | Temp 98.2°F | Ht 65.0 in | Wt 343.6 lb

## 2013-06-07 DIAGNOSIS — M25519 Pain in unspecified shoulder: Secondary | ICD-10-CM

## 2013-06-07 DIAGNOSIS — M25511 Pain in right shoulder: Secondary | ICD-10-CM

## 2013-06-07 DIAGNOSIS — R0789 Other chest pain: Secondary | ICD-10-CM

## 2013-06-07 DIAGNOSIS — R071 Chest pain on breathing: Secondary | ICD-10-CM

## 2013-06-07 DIAGNOSIS — S42009A Fracture of unspecified part of unspecified clavicle, initial encounter for closed fracture: Secondary | ICD-10-CM

## 2013-06-07 MED ORDER — GABAPENTIN 400 MG PO CAPS: 1200.0000 mg | ORAL_CAPSULE | Freq: Three times a day (TID) | ORAL | Status: DC

## 2013-06-07 MED ORDER — OXYCODONE-ACETAMINOPHEN 5-325 MG PO TABS: 1.0000 | ORAL_TABLET | Freq: Four times a day (QID) | ORAL | Status: DC | PRN
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
## 2013-06-10 ENCOUNTER — Telehealth: Payer: Self-pay | Admitting: Family Medicine

## 2013-06-13 ENCOUNTER — Other Ambulatory Visit: Payer: Self-pay | Admitting: Family Medicine

## 2013-06-13 ENCOUNTER — Telehealth: Payer: Self-pay | Admitting: Family Medicine

## 2013-06-13 DIAGNOSIS — R0789 Other chest pain: Secondary | ICD-10-CM

## 2013-06-13 MED ORDER — OXYCODONE-ACETAMINOPHEN 5-325 MG PO TABS: 1.0000 | ORAL_TABLET | Freq: Four times a day (QID) | ORAL | Status: DC | PRN

## 2013-06-13 NOTE — Telephone Encounter (Signed)
Patient aware rx up front to be picked up 

## 2013-06-13 NOTE — Telephone Encounter (Signed)
Pain rx ready for pick up tomorrow

## 2013-06-17 ENCOUNTER — Other Ambulatory Visit: Payer: Self-pay | Admitting: Family Medicine
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
## 2013-06-24 ENCOUNTER — Telehealth: Payer: Self-pay | Admitting: Family Medicine

## 2013-06-28 ENCOUNTER — Telehealth: Payer: Self-pay | Admitting: Family Medicine
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
## 2013-07-01 ENCOUNTER — Emergency Department (HOSPITAL_COMMUNITY)
Admission: EM | Admit: 2013-07-01 | Discharge: 2013-07-01 | Disposition: A | Discharge status: Home / Self Care | Payer: Medicaid Other | Attending: Emergency Medicine | Admitting: Emergency Medicine

## 2013-07-01 ENCOUNTER — Ambulatory Visit (INDEPENDENT_AMBULATORY_CARE_PROVIDER_SITE_OTHER): Payer: Medicaid Other | Admitting: Family Medicine

## 2013-07-01 ENCOUNTER — Encounter (HOSPITAL_COMMUNITY): Payer: Self-pay | Admitting: Emergency Medicine

## 2013-07-01 ENCOUNTER — Encounter: Payer: Self-pay | Admitting: Family Medicine

## 2013-07-01 VITALS — BP 98/66 | HR 91 | Temp 98.9°F | Ht 65.0 in | Wt 343.0 lb

## 2013-07-01 DIAGNOSIS — G4730 Sleep apnea, unspecified: Secondary | ICD-10-CM

## 2013-07-01 DIAGNOSIS — IMO0002 Reserved for concepts with insufficient information to code with codable children: Secondary | ICD-10-CM | POA: Insufficient documentation

## 2013-07-01 DIAGNOSIS — F411 Generalized anxiety disorder: Secondary | ICD-10-CM | POA: Insufficient documentation

## 2013-07-01 DIAGNOSIS — F17200 Nicotine dependence, unspecified, uncomplicated: Secondary | ICD-10-CM | POA: Insufficient documentation

## 2013-07-01 DIAGNOSIS — F329 Major depressive disorder, single episode, unspecified: Secondary | ICD-10-CM | POA: Insufficient documentation

## 2013-07-01 DIAGNOSIS — Z79899 Other long term (current) drug therapy: Secondary | ICD-10-CM | POA: Insufficient documentation

## 2013-07-01 DIAGNOSIS — Z8739 Personal history of other diseases of the musculoskeletal system and connective tissue: Secondary | ICD-10-CM | POA: Insufficient documentation

## 2013-07-01 DIAGNOSIS — R0602 Shortness of breath: Secondary | ICD-10-CM

## 2013-07-01 DIAGNOSIS — Z8719 Personal history of other diseases of the digestive system: Secondary | ICD-10-CM | POA: Insufficient documentation

## 2013-07-01 DIAGNOSIS — R110 Nausea: Secondary | ICD-10-CM

## 2013-07-01 DIAGNOSIS — Z8669 Personal history of other diseases of the nervous system and sense organs: Secondary | ICD-10-CM | POA: Insufficient documentation

## 2013-07-01 DIAGNOSIS — M542 Cervicalgia: Secondary | ICD-10-CM

## 2013-07-01 DIAGNOSIS — R0789 Other chest pain: Secondary | ICD-10-CM

## 2013-07-01 DIAGNOSIS — J4489 Other specified chronic obstructive pulmonary disease: Secondary | ICD-10-CM | POA: Insufficient documentation

## 2013-07-01 DIAGNOSIS — Z859 Personal history of malignant neoplasm, unspecified: Secondary | ICD-10-CM | POA: Insufficient documentation

## 2013-07-01 DIAGNOSIS — F3289 Other specified depressive episodes: Secondary | ICD-10-CM | POA: Insufficient documentation

## 2013-07-01 DIAGNOSIS — Z86718 Personal history of other venous thrombosis and embolism: Secondary | ICD-10-CM | POA: Insufficient documentation

## 2013-07-01 DIAGNOSIS — J449 Chronic obstructive pulmonary disease, unspecified: Secondary | ICD-10-CM | POA: Insufficient documentation

## 2013-07-01 DIAGNOSIS — G894 Chronic pain syndrome: Secondary | ICD-10-CM

## 2013-07-01 DIAGNOSIS — M25511 Pain in right shoulder: Secondary | ICD-10-CM

## 2013-07-01 DIAGNOSIS — M25519 Pain in unspecified shoulder: Secondary | ICD-10-CM | POA: Insufficient documentation

## 2013-07-01 DIAGNOSIS — R071 Chest pain on breathing: Secondary | ICD-10-CM

## 2013-07-01 DIAGNOSIS — E1169 Type 2 diabetes mellitus with other specified complication: Secondary | ICD-10-CM | POA: Insufficient documentation

## 2013-07-01 MED ORDER — HYDROCODONE BITARTRATE ER 20 MG PO T24A: 20.0000 mg | EXTENDED_RELEASE_TABLET | ORAL | Status: DC

## 2013-07-01 MED ORDER — ONDANSETRON 8 MG PO TBDP: 8.0000 mg | ORAL_TABLET | Freq: Three times a day (TID) | ORAL | Status: DC | PRN

## 2013-07-01 MED ORDER — BUPRENORPHINE 10 MCG/HR TD PTWK: 10.0000 ug | MEDICATED_PATCH | TRANSDERMAL | Status: DC

## 2013-07-01 MED ORDER — KETOROLAC TROMETHAMINE 30 MG/ML IJ SOLN
30.0000 mg | Freq: Once | INTRAMUSCULAR | Status: AC
  Administered 2013-07-01: 30 mg via INTRAMUSCULAR

## 2013-07-01 MED ORDER — OXYCODONE-ACETAMINOPHEN 5-325 MG PO TABS
2.0000 | ORAL_TABLET | Freq: Once | ORAL | Status: AC
  Administered 2013-07-01: 2 via ORAL
  Filled 2013-07-01: qty 2

## 2013-07-01 MED ORDER — OXYCODONE-ACETAMINOPHEN 5-325 MG PO TABS: 1.0000 | ORAL_TABLET | Freq: Four times a day (QID) | ORAL | Status: DC | PRN
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
## 2013-07-01 NOTE — Discharge Instructions (Signed)
Please discuss further pain medications for at home with your Dr. Sallyanne Havers or your orthopedist tomorrow. Please keep your appointment with the orthopedist tomorrow. Please read all discharge instructions and return precautions.   Shoulder Pain The shoulder is the joint that connects your arms to your body. The bones that form the shoulder joint include the upper arm bone (humerus), the shoulder blade (scapula), and the collarbone (clavicle). The top of the humerus is shaped like a ball and fits into a rather flat socket on the scapula (glenoid cavity). A combination of muscles and strong, fibrous tissues that connect muscles to bones (tendons) support your shoulder joint and hold the ball in the socket. Small, fluid-filled sacs (bursae) are located in different areas of the joint. They act as cushions between the bones and the overlying soft tissues and help reduce friction between the gliding tendons and the bone as you move your arm. Your shoulder joint allows a wide range of motion in your arm. This range of motion allows you to do things like scratch your back or throw a ball. However, this range of motion also makes your shoulder more prone to pain from overuse and injury. Causes of shoulder pain can originate from both injury and overuse and usually can be grouped in the following four categories:  Redness, swelling, and pain (inflammation) of the tendon (tendinitis) or the bursae (bursitis).  Instability, such as a dislocation of the joint.  Inflammation of the joint (arthritis).  Broken bone (fracture). HOME CARE INSTRUCTIONS   Apply ice to the sore area.  Put ice in a plastic bag.  Place a towel between your skin and the bag.  Leave the ice on for 15-20 minutes, 03-04 times per day for the first 2 days.  Stop using cold packs if they do not help with the pain.  If you have a shoulder sling or immobilizer, wear it as long as your caregiver instructs. Only remove it to shower or bathe.  Move your arm as little as possible, but keep your hand moving to prevent swelling.  Squeeze a soft ball or foam pad as much as possible to help prevent swelling.  Only take over-the-counter or prescription medicines for pain, discomfort, or fever as directed by your caregiver. SEEK MEDICAL CARE IF:   Your shoulder pain increases, or new pain develops in your arm, hand, or fingers.  Your hand or fingers become cold and numb.  Your pain is not relieved with medicines. SEEK IMMEDIATE MEDICAL CARE IF:   Your arm, hand, or fingers are numb or tingling.  Your arm, hand, or fingers are significantly swollen or turn white or blue. MAKE SURE YOU:   Understand these instructions.  Will watch your condition.  Will get help right away if you are not doing well or get worse. Document Released: 10/20/2004 Document Revised: 10/05/2011 Document Reviewed: 12/25/2010 [facility] Patient Information 2014 [employer].

## 2013-07-01 NOTE — Addendum Note (Signed)
Addended by: Marin Olp on: 07/01/2013 04:57 PM   Modules accepted: Orders
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
## 2013-07-01 NOTE — Addendum Note (Signed)
Addended by: Lysbeth Penner on: 07/01/2013 04:55 PM   Modules accepted: Orders
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
## 2013-07-01 NOTE — ED Notes (Addendum)
Pt has been standing in the doorway cursing and demanding she been immediately.

## 2013-07-04 ENCOUNTER — Telehealth: Payer: Self-pay | Admitting: *Deleted

## 2013-07-04 NOTE — ED Provider Notes (Signed)
Medical screening examination/treatment/procedure(s) were performed by non-physician practitioner and as supervising physician I was immediately available for consultation/collaboration.   EKG Interpretation None       Varney Biles, MD 07/04/13 [phone]

## 2013-07-04 NOTE — Telephone Encounter (Signed)
Bill, [place] [employer] will pay for fentanyl patches, kadian,  Morphine sulfateER or opana will either of these work.  I don't think there is much of a chance that they will pay for the butrans unless she has tried at least one of these.  Thanks.

## 2013-07-04 NOTE — Telephone Encounter (Signed)
Got call regarding pain medications, I explained that her ins will not cover the patch that Bill ordered and that I had sent him the list of med they want tried before they will cover the one he ordered.  Then she wanted someone to order her some more hydrocodpne, I explained that [place] and his nurse was off until tomorrow and she wanted to speak to Dr. Laurance Flatten or the nurse in charge, however she hung up before Sharee Pimple could speak with her.  Then she called back and left a message to have someone call her and when [facility] called she did not ans but [place] got answering machine.Marland Kitchen

## 2013-07-05 ENCOUNTER — Encounter: Payer: Self-pay | Admitting: Family Medicine

## 2013-07-05 ENCOUNTER — Ambulatory Visit (INDEPENDENT_AMBULATORY_CARE_PROVIDER_SITE_OTHER): Payer: Medicaid Other | Admitting: Family Medicine

## 2013-07-05 VITALS — BP 128/87 | HR 78 | Temp 99.2°F | Wt 343.8 lb

## 2013-07-05 DIAGNOSIS — M542 Cervicalgia: Secondary | ICD-10-CM

## 2013-07-05 MED ORDER — KETOROLAC TROMETHAMINE 30 MG/ML IJ SOLN
30.0000 mg | Freq: Once | INTRAMUSCULAR | Status: AC
  Administered 2013-07-05: 30 mg via INTRAMUSCULAR

## 2013-07-05 NOTE — Progress Notes (Signed)
   Subjective:    Patient ID: Judith Rice, female    DOB: 1975-10-14, 38 y.o.   MRN: [id number]  HPI This 38 y.o. female presents for evaluation of c/o neck pain and back pain.  She is upset and states she has been w/o sleep for 4 days.  She is asking for MRI and CT scan of neck.  She is upset and cursing stating "Nobody fucking cares about me and you changed my pain medicine."  She states she is out of her percocet and wants a refill.   Review of Systems No chest pain, SOB, HA, dizziness, vision change, N/V, diarrhea, constipation, dysuria, urinary urgency or frequency, myalgias, arthralgias or rash.     Objective:   Physical Exam Vital signs noted  Well developed well nourished obese female who is anxious.  HEENT - Head atraumatic Normocephalic Respiratory - Lungs CTA bilateral Cardiac - RRR S1 and S2 without murmur MS - TTP cervical paraspinous muscles and myofascial region      Assessment & Plan:  Cervicalgia Toradol 30mg  SQ.  She has recently been rx'd percocet for pain and butrans patches.  She  Is escalating her pain meds and I explain I am not comfortable rx her any narcotic pain meds because She is using too much and will refer to pain management and another PT referral. She becomes upset and swears and tells me that she will buy the drugs off the street.  I recommend she not do this and she tells me she smokes pot daily and that a pain clinic will not take her because this has happened at another clinic in the past.  She refused PT referral.  She refuses to do Xray of neck. She accepts toradol shot.  She is cursing and yelling as she leaves clinic.  Lysbeth Penner FNP

## 2013-07-08 ENCOUNTER — Institutional Professional Consult (permissible substitution): Payer: Self-pay | Admitting: Internal Medicine

## 2013-07-29 ENCOUNTER — Institutional Professional Consult (permissible substitution): Payer: Self-pay | Admitting: Internal Medicine

## 2013-07-29 ENCOUNTER — Telehealth: Payer: Self-pay | Admitting: Family Medicine

## 2013-07-30 NOTE — Telephone Encounter (Signed)
I recommend the patient to go seek another PCP and have spoken to [facility] about this.

## 2013-07-31 ENCOUNTER — Telehealth: Payer: Self-pay | Admitting: Family Medicine

## 2013-07-31 NOTE — Telephone Encounter (Signed)
They were calling to let us know they were dismissing her because they way she acted. They wanted to send her to pain management but she said she could not pass a drug screen to go to one.

## 2013-08-05 ENCOUNTER — Institutional Professional Consult (permissible substitution): Payer: Self-pay | Admitting: Internal Medicine

## 2013-08-07 NOTE — Telephone Encounter (Signed)
See other note iin chart

## 2013-08-07 NOTE — Telephone Encounter (Signed)
Talked with [facility] regarding this and he does not want to put pt on any of the substitute meds, said he would send her to pain management.

## 2013-08-10 ENCOUNTER — Emergency Department (HOSPITAL_COMMUNITY)
Admission: EM | Admit: 2013-08-10 | Discharge: 2013-08-11 | Disposition: A | Discharge status: Home / Self Care | Payer: MEDICAID | Attending: Emergency Medicine | Admitting: Emergency Medicine

## 2013-08-10 ENCOUNTER — Encounter (HOSPITAL_COMMUNITY): Payer: Self-pay | Admitting: Emergency Medicine

## 2013-08-10 DIAGNOSIS — F17200 Nicotine dependence, unspecified, uncomplicated: Secondary | ICD-10-CM | POA: Insufficient documentation

## 2013-08-10 DIAGNOSIS — Z79899 Other long term (current) drug therapy: Secondary | ICD-10-CM | POA: Insufficient documentation

## 2013-08-10 DIAGNOSIS — J449 Chronic obstructive pulmonary disease, unspecified: Secondary | ICD-10-CM | POA: Insufficient documentation

## 2013-08-10 DIAGNOSIS — F1290 Cannabis use, unspecified, uncomplicated: Secondary | ICD-10-CM

## 2013-08-10 DIAGNOSIS — Z8719 Personal history of other diseases of the digestive system: Secondary | ICD-10-CM | POA: Insufficient documentation

## 2013-08-10 DIAGNOSIS — J4489 Other specified chronic obstructive pulmonary disease: Secondary | ICD-10-CM | POA: Insufficient documentation

## 2013-08-10 DIAGNOSIS — F22 Delusional disorders: Secondary | ICD-10-CM | POA: Insufficient documentation

## 2013-08-10 DIAGNOSIS — Z859 Personal history of malignant neoplasm, unspecified: Secondary | ICD-10-CM | POA: Diagnosis not present

## 2013-08-10 DIAGNOSIS — Z8739 Personal history of other diseases of the musculoskeletal system and connective tissue: Secondary | ICD-10-CM | POA: Insufficient documentation

## 2013-08-10 DIAGNOSIS — F3289 Other specified depressive episodes: Secondary | ICD-10-CM | POA: Diagnosis present

## 2013-08-10 DIAGNOSIS — F329 Major depressive disorder, single episode, unspecified: Secondary | ICD-10-CM | POA: Diagnosis present

## 2013-08-10 DIAGNOSIS — R45851 Suicidal ideations: Secondary | ICD-10-CM | POA: Diagnosis not present

## 2013-08-10 DIAGNOSIS — F4010 Social phobia, unspecified: Secondary | ICD-10-CM | POA: Insufficient documentation

## 2013-08-10 DIAGNOSIS — E1169 Type 2 diabetes mellitus with other specified complication: Secondary | ICD-10-CM | POA: Diagnosis not present

## 2013-08-10 DIAGNOSIS — Z86718 Personal history of other venous thrombosis and embolism: Secondary | ICD-10-CM | POA: Insufficient documentation

## 2013-08-10 DIAGNOSIS — F1210 Cannabis abuse, uncomplicated: Secondary | ICD-10-CM | POA: Diagnosis not present

## 2013-08-10 DIAGNOSIS — F32A Depression, unspecified: Secondary | ICD-10-CM

## 2013-08-10 NOTE — ED Notes (Signed)
Pt arrived to the Ed with a complaint of depression and paranoid thoughts.  Pt has a hx of same and takes medication for said but has not been achieving relief for symptoms.  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

## 2013-08-11 LAB — COMPREHENSIVE METABOLIC PANEL
ALBUMIN: 3.4 g/dL — AB (ref 3.5–5.2)
ALT: 13 U/L (ref 0–35)
AST: 14 U/L (ref 0–37)
Alkaline Phosphatase: 81 U/L (ref 39–117)
Anion gap: 13 (ref 5–15)
BILIRUBIN TOTAL: 0.3 mg/dL (ref 0.3–1.2)
BUN: 9 mg/dL (ref 6–23)
CO2: 25 mEq/L (ref 19–32)
CREATININE: 0.87 mg/dL (ref 0.50–1.10)
Calcium: 9.2 mg/dL (ref 8.4–10.5)
Chloride: 99 mEq/L (ref 96–112)
GFR calc Af Amer: >90 mL/min (ref >90)
GFR calc non Af Amer: 85 mL/min — ABNORMAL LOW (ref >90)
Glucose, Bld: 110 mg/dL — ABNORMAL HIGH (ref 70–99)
Potassium: 4.2 mEq/L (ref 3.7–5.3)
SODIUM: 137 meq/L (ref 137–147)
Total Protein: 7.6 g/dL (ref 6.0–8.3)

## 2013-08-11 LAB — CBC
HCT: 44.7 % (ref 36.0–46.0)
Hemoglobin: 15 g/dL (ref 12.0–15.0)
MCH: 29.8 pg (ref 26.0–34.0)
MCHC: 33.6 g/dL (ref 30.0–36.0)
MCV: 88.9 fL (ref 78.0–100.0)
PLATELETS: 262 10*3/uL (ref 150–400)
RBC: 5.03 MIL/uL (ref 3.87–5.11)
RDW: 15.8 % — AB (ref 11.5–15.5)
WBC: 13.1 10*3/uL — ABNORMAL HIGH (ref 4.0–10.5)

## 2013-08-11 LAB — ETHANOL

## 2013-08-11 LAB — SALICYLATE LEVEL: Salicylate Lvl: <2 mg/dL — ABNORMAL LOW (ref 2.8–20.0)

## 2013-08-11 LAB — RAPID URINE DRUG SCREEN, HOSP PERFORMED
Amphetamines: NOT DETECTED
BENZODIAZEPINES: NOT DETECTED
Barbiturates: NOT DETECTED
COCAINE: NOT DETECTED
Opiates: NOT DETECTED
Tetrahydrocannabinol: POSITIVE — AB

## 2013-08-11 LAB — ACETAMINOPHEN LEVEL

## 2013-08-11 MED ORDER — HYDROXYZINE HCL 25 MG PO TABS: 50.0000 mg | ORAL_TABLET | Freq: Three times a day (TID) | ORAL | Status: DC | PRN

## 2013-08-11 MED ORDER — ALBUTEROL SULFATE (2.5 MG/3ML) 0.083% IN NEBU: 2.5000 mg | INHALATION_SOLUTION | Freq: Four times a day (QID) | RESPIRATORY_TRACT | Status: DC | PRN

## 2013-08-11 MED ORDER — IPRATROPIUM-ALBUTEROL 0.5-2.5 (3) MG/3ML IN SOLN: 3.0000 mL | Freq: Four times a day (QID) | RESPIRATORY_TRACT | Status: DC | PRN

## 2013-08-11 MED ORDER — LUBIPROSTONE 24 MCG PO CAPS
24.0000 ug | ORAL_CAPSULE | Freq: Two times a day (BID) | ORAL | Status: DC
  Filled 2013-08-11 (×3): qty 1

## 2013-08-11 MED ORDER — ZOLPIDEM TARTRATE 5 MG PO TABS: 5.0000 mg | ORAL_TABLET | Freq: Every evening | ORAL | Status: DC | PRN

## 2013-08-11 MED ORDER — POLYETHYLENE GLYCOL 3350 17 G PO PACK
17.0000 g | PACK | Freq: Every day | ORAL | Status: DC | PRN
  Filled 2013-08-11: qty 1

## 2013-08-11 MED ORDER — CITALOPRAM HYDROBROMIDE 40 MG PO TABS
40.0000 mg | ORAL_TABLET | Freq: Every day | ORAL | Status: DC
  Filled 2013-08-11: qty 1

## 2013-08-11 MED ORDER — TRIAMTERENE-HCTZ 37.5-25 MG PO TABS
1.0000 | ORAL_TABLET | Freq: Two times a day (BID) | ORAL | Status: DC
  Filled 2013-08-11 (×2): qty 1

## 2013-08-11 MED ORDER — ARIPIPRAZOLE 10 MG PO TABS
10.0000 mg | ORAL_TABLET | Freq: Two times a day (BID) | ORAL | Status: DC
  Filled 2013-08-11 (×3): qty 1

## 2013-08-11 MED ORDER — IBUPROFEN 200 MG PO TABS: 600.0000 mg | ORAL_TABLET | Freq: Three times a day (TID) | ORAL | Status: DC | PRN

## 2013-08-11 MED ORDER — BUDESONIDE-FORMOTEROL FUMARATE 80-4.5 MCG/ACT IN AERO
2.0000 | INHALATION_SPRAY | Freq: Two times a day (BID) | RESPIRATORY_TRACT | Status: DC
  Filled 2013-08-11: qty 6.9

## 2013-08-11 MED ORDER — SIMVASTATIN 10 MG PO TABS
10.0000 mg | ORAL_TABLET | Freq: Every day | ORAL | Status: DC
  Filled 2013-08-11: qty 1

## 2013-08-11 MED ORDER — ALUM & MAG HYDROXIDE-SIMETH 200-200-20 MG/5ML PO SUSP: 30.0000 mL | ORAL | Status: DC | PRN

## 2013-08-11 MED ORDER — NICOTINE 21 MG/24HR TD PT24: 21.0000 mg | MEDICATED_PATCH | Freq: Every day | TRANSDERMAL | Status: DC

## 2013-08-11 MED ORDER — ACETAMINOPHEN 325 MG PO TABS: 650.0000 mg | ORAL_TABLET | ORAL | Status: DC | PRN

## 2013-08-11 MED ORDER — PANTOPRAZOLE SODIUM 40 MG PO TBEC: 40.0000 mg | DELAYED_RELEASE_TABLET | Freq: Every day | ORAL | Status: DC

## 2013-08-11 MED ORDER — IPRATROPIUM-ALBUTEROL 18-103 MCG/ACT IN AERO: 2.0000 | INHALATION_SPRAY | Freq: Four times a day (QID) | RESPIRATORY_TRACT | Status: DC | PRN

## 2013-08-11 MED ORDER — ONDANSETRON 8 MG PO TBDP: 8.0000 mg | ORAL_TABLET | Freq: Three times a day (TID) | ORAL | Status: DC | PRN

## 2013-08-11 MED ORDER — ONDANSETRON HCL 4 MG PO TABS: 4.0000 mg | ORAL_TABLET | Freq: Three times a day (TID) | ORAL | Status: DC | PRN

## 2013-08-11 MED ORDER — GABAPENTIN 400 MG PO CAPS
1200.0000 mg | ORAL_CAPSULE | Freq: Three times a day (TID) | ORAL | Status: DC
  Filled 2013-08-11 (×3): qty 3

## 2013-08-11 NOTE — Discharge Instructions (Signed)
Depression, Adult Depression refers to feeling sad, low, down in the dumps, blue, gloomy, or empty. In general, there are two kinds of depression: 1. Depression that we all experience from time to time because of upsetting life experiences, including the loss of a job or the ending of a relationship (normal sadness or normal grief). This kind of depression is considered normal, is short lived, and resolves within a few days to 2 weeks. (Depression experienced after the loss of a loved one is called bereavement. Bereavement often lasts longer than 2 weeks but normally gets better with time.) 2. Clinical depression, which lasts longer than normal sadness or normal grief or interferes with your ability to function at home, at work, and in school. It also interferes with your personal relationships. It affects almost every aspect of your life. Clinical depression is an illness. Symptoms of depression also can be caused by conditions other than normal sadness and grief or clinical depression. Examples of these conditions are listed as follows:  Physical illness--Some physical illnesses, including underactive thyroid gland (hypothyroidism), severe anemia, specific types of cancer, diabetes, uncontrolled seizures, heart and lung problems, strokes, and chronic pain are commonly associated with symptoms of depression.  Side effects of some prescription medicine--In some people, certain types of prescription medicine can cause symptoms of depression.  Substance abuse--Abuse of alcohol and illicit drugs can cause symptoms of depression. SYMPTOMS Symptoms of normal sadness and normal grief include the following:  Feeling sad or crying for short periods of time.  Not caring about anything (apathy).  Difficulty sleeping or sleeping too much.  No longer able to enjoy the things you used to enjoy.  Desire to be by oneself all the time (social isolation).  Lack of energy or motivation.  Difficulty  concentrating or remembering.  Change in appetite or weight.  Restlessness or agitation. Symptoms of clinical depression include the same symptoms of normal sadness or normal grief and also the following symptoms:  Feeling sad or crying all the time.  Feelings of guilt or worthlessness.  Feelings of hopelessness or helplessness.  Thoughts of suicide or the desire to harm yourself (suicidal ideation).  Loss of touch with reality (psychotic symptoms). Seeing or hearing things that are not real (hallucinations) or having false beliefs about your life or the people around you (delusions and paranoia). DIAGNOSIS  The diagnosis of clinical depression usually is based on the severity and duration of the symptoms. Your caregiver also will ask you questions about your medical history and substance use to find out if physical illness, use of prescription medicine, or substance abuse is causing your depression. Your caregiver also may order blood tests. TREATMENT  Typically, normal sadness and normal grief do not require treatment. However, sometimes antidepressant medicine is prescribed for bereavement to ease the depressive symptoms until they resolve. The treatment for clinical depression depends on the severity of your symptoms but typically includes antidepressant medicine, counseling with a mental health professional, or a combination of both. Your caregiver will help to determine what treatment is best for you. Depression caused by physical illness usually goes away with appropriate medical treatment of the illness. If prescription medicine is causing depression, talk with your caregiver about stopping the medicine, decreasing the dose, or substituting another medicine. Depression caused by abuse of alcohol or illicit drugs abuse goes away with abstinence from these substances. Some adults need professional help in order to stop drinking or using drugs. SEEK IMMEDIATE CARE IF:  You have thoughts  about   hurting yourself or others.  You lose touch with reality (have psychotic symptoms).  You are taking medicine for depression and have a serious side effect. FOR MORE INFORMATION National Alliance on Mental Illness: www.nami.org National Institute of Mental Health: www.nimh.nih.gov Document Released: 01/08/2000 Document Revised: 07/12/2011 Document Reviewed: 04/11/2011 [employer] Patient Information 2015 [employer]. This information is not intended to replace advice given to you by your health care provider. Make sure you discuss any questions you have with your health care provider.  

## 2013-08-11 NOTE — ED Provider Notes (Signed)
CSN: [id number]     Arrival date & time 08/10/13  2327 History   First MD Initiated Contact with Patient 08/11/13 0046     Chief Complaint  Patient presents with  . Depression  . Paranoid     (Consider location/radiation/quality/duration/timing/severity/associated sxs/prior Treatment) HPI   Patient presents to the emergency departments brought in by her friend's mother with complaints of depression and paranoid thoughts. She is on medications but feels that since they have been readjusted that they are "off". She denies feeling suicidal or homicidal. She does admit to feeling paranoid but will not elaborate on what that means. He should states that she change her mind and does not want to stay anymore. She is concerned about her addiction to marijuana and would like to get help to come off of it. She has COPD and takes ( Gabapentin, Atarax, Amitiza, proventil, Combivent, Abilify, Symbicort, celexa, colace, prilosec, flonase, dyazide, artane, and Zofran) She is supposed to use a CPAP for her sleep apnea but admits she never got the machine. She reports that she is always tired. She is obese. Denies hallucinations or delusions. She is here voluntarily.   Past Medical History  Diagnosis Date  . Nerve damage   . Sleep apnea   . COPD (chronic obstructive pulmonary disease)   . Asthma   . Hypoglycemia associated with diabetes   . Insomnia   . Gallstones   . Social anxiety disorder   . Cancer   . DVT (deep venous thrombosis)     LLE, 04/2012, Xarelto anticoagulation initiated  . Depression   . Suicidal ideations   . Baker's cyst     left   . Anxiety    Past Surgical History  Procedure Laterality Date  . Abdominal hysterectomy    . Dilation and curettage of uterus     Family History  Problem Relation Age of Onset  . Arrhythmia Father    History  Substance Use Topics  . Smoking status: Current Every Day Smoker -- 1.00 packs/day    Types: Cigarettes  . Smokeless tobacco: Not on  file     Comment: 1-2 packs per day, depending on nerves   . Alcohol Use: Yes     Comment: occ   OB History   Grav Para Term Preterm Abortions TAB SAB Ect Mult Living                 Review of Systems   Review of Systems  Gen: no weight loss, fevers, chills, night sweats  Eyes: no discharge or drainage, no occular pain or visual changes  Nose: no epistaxis or rhinorrhea  Mouth: no dental pain, no sore throat  Neck: no neck pain  Lungs:No wheezing or hemoptysis + chronic coughing CV: no chest pain, palpitations, dependent edema or orthopnea  Abd: no abdominal pain, nausea, vomiting, diarrhea GU: no dysuria or gross hematuria  MSK:  No muscle weakness or pain Neuro: no headache, no focal neurologic deficits  Skin: no rash or wounds Psyche: + depression and paranoia     Allergies  Aspartame and phenylalanine; Bee venom; Clindamycin/lincomycin; Hydrocodone; and Slo-bid gyrocaps  Home Medications   Prior to Admission medications   Medication Sig Start Date End Date Taking? Authorizing Provider  albuterol (PROVENTIL) (2.5 MG/3ML) 0.083% nebulizer solution Take 3 mLs (2.5 mg total) by nebulization every 6 (six) hours as needed for wheezing or shortness of breath. 05/13/13  Yes Shanda Howells, MD  albuterol-ipratropium (COMBIVENT) 18-103 MCG/ACT inhaler Inhale 2 puffs  into the lungs every 6 (six) hours as needed for wheezing. 05/22/13  Yes Lysbeth Penner, FNP  ARIPiprazole (ABILIFY) 10 MG tablet Take 1 tablet (10 mg total) by mouth 2 (two) times daily after a meal. For mood control 03/13/13  Yes Encarnacion Slates, NP  budesonide-formoterol (SYMBICORT) 80-4.5 MCG/ACT inhaler Inhale 2 puffs into the lungs 2 (two) times daily. For shortness of breath 04/30/13  Yes Shanda Howells, MD  citalopram (CELEXA) 40 MG tablet Take 1 tablet (40 mg total) by mouth daily. For depression 03/13/13  Yes Encarnacion Slates, NP  docusate sodium (COLACE) 100 MG capsule Take 1 capsule (100 mg total) by mouth 2 (two)  times daily. 05/10/13  Yes Lysbeth Penner, FNP  fluticasone (FLONASE) 50 MCG/ACT nasal spray Place 1 spray into both nostrils at bedtime. 05/22/13  Yes Lysbeth Penner, FNP  gabapentin (NEURONTIN) 400 MG capsule Take 3 capsules (1,200 mg total) by mouth 3 (three) times daily. For mood stabilization 06/07/13  Yes Lysbeth Penner, FNP  gabapentin (NEURONTIN) 400 MG capsule Take 1,200 mg by mouth 3 (three) times daily.   Yes Historical Provider, MD  hydrOXYzine (ATARAX/VISTARIL) 50 MG tablet Take 1 tablet (50 mg total) by mouth 3 (three) times daily as needed for anxiety or itching. 03/13/13  Yes Encarnacion Slates, NP  lubiprostone (AMITIZA) 24 MCG capsule Take 1 capsule (24 mcg total) by mouth 2 (two) times daily with a meal. 05/22/13  Yes Lysbeth Penner, FNP  omeprazole (PRILOSEC) 20 MG capsule Take 1 capsule (20 mg total) by mouth daily as needed (heartburn). 04/30/13  Yes Shanda Howells, MD  ondansetron (ZOFRAN ODT) 8 MG disintegrating tablet Take 1 tablet (8 mg total) by mouth every 8 (eight) hours as needed for nausea or vomiting. 07/01/13  Yes Lysbeth Penner, FNP  polyethylene glycol (MIRALAX / GLYCOLAX) packet Take 17 g by mouth daily as needed for mild constipation.   Yes Historical Provider, MD  pravastatin (PRAVACHOL) 40 MG tablet Take 60 mg by mouth daily.   Yes Historical Provider, MD  triamterene-hydrochlorothiazide (DYAZIDE) 37.5-25 MG per capsule Take 1 capsule by mouth 2 (two) times daily. For hypertension 03/13/13  Yes Encarnacion Slates, NP   BP 130/65  Pulse 88  Temp(Src) 98.6 F (37 C) (Oral)  Resp 18  SpO2 92% Physical Exam  Nursing note and vitals reviewed. Constitutional: She appears well-developed and well-nourished. No distress.  HENT:  Head: Normocephalic and atraumatic.  Eyes: Pupils are equal, round, and reactive to light.  Neck: Normal range of motion. Neck supple.  Cardiovascular: Normal rate and regular rhythm.   Pulmonary/Chest: Effort normal.  Abdominal: Soft.   Neurological: She is alert.  Skin: Skin is warm and dry.  Psychiatric: Her mood appears anxious. Her speech is not rapid and/or pressured. She is not actively hallucinating. Thought content is paranoid. She exhibits a depressed mood. She expresses no homicidal and no suicidal ideation.    ED Course  Procedures (including critical care time) Labs Review Labs Reviewed  CBC - Abnormal; Notable for the following:    WBC 13.1 (*)    RDW 15.8 (*)    All other components within normal limits  COMPREHENSIVE METABOLIC PANEL - Abnormal; Notable for the following:    Glucose, Bld 110 (*)    Albumin 3.4 (*)    GFR calc non Af Amer 85 (*)    All other components within normal limits  SALICYLATE LEVEL - Abnormal; Notable for the following:  Salicylate Lvl <6.4 (*)    All other components within normal limits  URINE RAPID DRUG SCREEN (HOSP PERFORMED) - Abnormal; Notable for the following:    Tetrahydrocannabinol POSITIVE (*)    All other components within normal limits  ACETAMINOPHEN LEVEL  ETHANOL    Imaging Review No results found.   EKG Interpretation None      MDM   Final diagnoses:  Depression  Marijuana use  Paranoia   patient medically cleared Holding orders placed. TTS consult requested.  Home meds reviewed.  Filed Vitals:   08/10/13 2338  BP: 130/65  Pulse: 88  Temp: 98.6 F (37 C)  Resp: [address] Elnita Maxwell 08/11/13 0138  Patient does not meet inpatient criteria and is to follow-up with her outpatient resources as recommended by [facility].    Linus Mako, PA-C 08/11/13 [phone]
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
## 2013-08-11 NOTE — BH Assessment (Signed)
Received a call for an assessment. Spoke with Delos Haring, PA-C who reported that patient was brought in by her boyfriend's mother. PT has a history of depression. Pt is on medication but it was recently adjusted and since the adjustment pt has been feeling paranoid, anxious and experiencing worsening depression. Pt's boyfriend mother reported that it difficult to have a conversation with patient due to her flight of ideas. Pt has not been taking care of herself as she usually would due to being paranoid. Assessment will be initiated.

## 2013-08-11 NOTE — ED Provider Notes (Signed)
Medical screening examination/treatment/procedure(s) were performed by non-physician practitioner and as supervising physician I was immediately available for consultation/collaboration.   EKG Interpretation None       Kalman Drape, MD 08/11/13 [phone]

## 2013-08-11 NOTE — ED Notes (Signed)
Emergency Contacts: Roger Kill (boyfriend's mother) [phone] Lorin Glass (boyfriend) [phone] Natalia Leatherwood (mother) [phone] (home) [phone] (cell) Emi Belfast (father) [phone] (cell) Merian Capron (sister) [phone]

## 2013-08-11 NOTE — BH Assessment (Signed)
Assessment completed. Consulted with Darlyne Russian, PA-C who recommended that patient follow up with Dr. Hoyle Barr on Monday. Delos Haring, PA-C has been notified of the recommendations.

## 2013-08-11 NOTE — ED Notes (Signed)
PA-C at bedside 

## 2013-08-26 ENCOUNTER — Encounter (HOSPITAL_COMMUNITY): Payer: Self-pay | Admitting: Emergency Medicine

## 2013-08-26 ENCOUNTER — Emergency Department (HOSPITAL_COMMUNITY)
Admission: EM | Admit: 2013-08-26 | Discharge: 2013-08-26 | Disposition: A | Discharge status: Home / Self Care | Payer: MEDICAID | Attending: Emergency Medicine | Admitting: Emergency Medicine

## 2013-08-26 DIAGNOSIS — F329 Major depressive disorder, single episode, unspecified: Secondary | ICD-10-CM | POA: Insufficient documentation

## 2013-08-26 DIAGNOSIS — F3289 Other specified depressive episodes: Secondary | ICD-10-CM | POA: Insufficient documentation

## 2013-08-26 DIAGNOSIS — J449 Chronic obstructive pulmonary disease, unspecified: Secondary | ICD-10-CM | POA: Diagnosis not present

## 2013-08-26 DIAGNOSIS — Z859 Personal history of malignant neoplasm, unspecified: Secondary | ICD-10-CM | POA: Insufficient documentation

## 2013-08-26 DIAGNOSIS — IMO0002 Reserved for concepts with insufficient information to code with codable children: Secondary | ICD-10-CM | POA: Insufficient documentation

## 2013-08-26 DIAGNOSIS — Z86718 Personal history of other venous thrombosis and embolism: Secondary | ICD-10-CM | POA: Insufficient documentation

## 2013-08-26 DIAGNOSIS — E119 Type 2 diabetes mellitus without complications: Secondary | ICD-10-CM | POA: Insufficient documentation

## 2013-08-26 DIAGNOSIS — J4489 Other specified chronic obstructive pulmonary disease: Secondary | ICD-10-CM | POA: Insufficient documentation

## 2013-08-26 DIAGNOSIS — G479 Sleep disorder, unspecified: Secondary | ICD-10-CM | POA: Diagnosis not present

## 2013-08-26 DIAGNOSIS — F411 Generalized anxiety disorder: Secondary | ICD-10-CM | POA: Insufficient documentation

## 2013-08-26 DIAGNOSIS — Z79899 Other long term (current) drug therapy: Secondary | ICD-10-CM | POA: Insufficient documentation

## 2013-08-26 DIAGNOSIS — F17200 Nicotine dependence, unspecified, uncomplicated: Secondary | ICD-10-CM | POA: Diagnosis not present

## 2013-08-26 DIAGNOSIS — F32A Depression, unspecified: Secondary | ICD-10-CM

## 2013-08-26 DIAGNOSIS — Z8719 Personal history of other diseases of the digestive system: Secondary | ICD-10-CM | POA: Insufficient documentation

## 2013-08-26 DIAGNOSIS — Z8669 Personal history of other diseases of the nervous system and sense organs: Secondary | ICD-10-CM | POA: Diagnosis not present

## 2013-08-26 DIAGNOSIS — Z8739 Personal history of other diseases of the musculoskeletal system and connective tissue: Secondary | ICD-10-CM | POA: Diagnosis not present

## 2013-08-26 LAB — CBC
HCT: 48.9 % — ABNORMAL HIGH (ref 36.0–46.0)
Hemoglobin: 16.3 g/dL — ABNORMAL HIGH (ref 12.0–15.0)
MCH: 29.6 pg (ref 26.0–34.0)
MCHC: 33.3 g/dL (ref 30.0–36.0)
MCV: 88.9 fL (ref 78.0–100.0)
Platelets: 225 10*3/uL (ref 150–400)
RBC: 5.5 MIL/uL — ABNORMAL HIGH (ref 3.87–5.11)
RDW: 16.3 % — ABNORMAL HIGH (ref 11.5–15.5)
WBC: 12.2 10*3/uL — ABNORMAL HIGH (ref 4.0–10.5)

## 2013-08-26 LAB — COMPREHENSIVE METABOLIC PANEL
ALBUMIN: 3.4 g/dL — AB (ref 3.5–5.2)
ALK PHOS: 87 U/L (ref 39–117)
ALT: 21 U/L (ref 0–35)
ANION GAP: 12 (ref 5–15)
AST: 17 U/L (ref 0–37)
BUN: 8 mg/dL (ref 6–23)
CALCIUM: 9.6 mg/dL (ref 8.4–10.5)
CO2: 24 mEq/L (ref 19–32)
Chloride: 102 mEq/L (ref 96–112)
Creatinine, Ser: 0.77 mg/dL (ref 0.50–1.10)
GFR calc Af Amer: >90 mL/min (ref >90)
GFR calc non Af Amer: >90 mL/min (ref >90)
Glucose, Bld: 90 mg/dL (ref 70–99)
Potassium: 4.3 mEq/L (ref 3.7–5.3)
SODIUM: 138 meq/L (ref 137–147)
TOTAL PROTEIN: 7.5 g/dL (ref 6.0–8.3)
Total Bilirubin: 0.3 mg/dL (ref 0.3–1.2)

## 2013-08-26 LAB — RAPID URINE DRUG SCREEN, HOSP PERFORMED
Amphetamines: NOT DETECTED
Barbiturates: NOT DETECTED
Benzodiazepines: NOT DETECTED
COCAINE: NOT DETECTED
OPIATES: NOT DETECTED
TETRAHYDROCANNABINOL: POSITIVE — AB

## 2013-08-26 LAB — ETHANOL: Alcohol, Ethyl (B): <11 mg/dL (ref 0–11)

## 2013-08-26 LAB — SALICYLATE LEVEL

## 2013-08-26 LAB — ACETAMINOPHEN LEVEL: Acetaminophen (Tylenol), Serum: <15 ug/mL (ref 10–30)

## 2013-08-26 NOTE — Discharge Instructions (Signed)
Return to the emergency room with worsening of symptoms or with symptoms that are concerning. Especially with thoughts and/or plans of harming yourself or others and thoughts/plans of suicide. Suicide Hotline: 1 (800) 934-392-0721  Depression Depression refers to feeling sad, low, down in the dumps, blue, gloomy, or empty. In general, there are two kinds of depression: 1. Normal sadness or normal grief. This kind of depression is one that we all feel from time to time after upsetting life experiences, such as the loss of a job or the ending of a relationship. This kind of depression is considered normal, is short lived, and resolves within a few days to 2 weeks. Depression experienced after the loss of a loved one (bereavement) often lasts longer than 2 weeks but normally gets better with time. 2. Clinical depression. This kind of depression lasts longer than normal sadness or normal grief or interferes with your ability to function at home, at work, and in school. It also interferes with your personal relationships. It affects almost every aspect of your life. Clinical depression is an illness. Symptoms of depression can also be caused by conditions other than those mentioned above, such as:  Physical illness. Some physical illnesses, including underactive thyroid gland (hypothyroidism), severe anemia, specific types of cancer, diabetes, uncontrolled seizures, heart and lung problems, strokes, and chronic pain are commonly associated with symptoms of depression.  Side effects of some prescription medicine. In some people, certain types of medicine can cause symptoms of depression.  Substance abuse. Abuse of alcohol and illicit drugs can cause symptoms of depression. SYMPTOMS Symptoms of normal sadness and normal grief include the following:  Feeling sad or crying for short periods of time.  Not caring about anything (apathy).  Difficulty sleeping or sleeping too much.  No longer able to enjoy the  things you used to enjoy.  Desire to be by oneself all the time (social isolation).  Lack of energy or motivation.  Difficulty concentrating or remembering.  Change in appetite or weight.  Restlessness or agitation. Symptoms of clinical depression include the same symptoms of normal sadness or normal grief and also the following symptoms:  Feeling sad or crying all the time.  Feelings of guilt or worthlessness.  Feelings of hopelessness or helplessness.  Thoughts of suicide or the desire to harm yourself (suicidal ideation).  Loss of touch with reality (psychotic symptoms). Seeing or hearing things that are not real (hallucinations) or having false beliefs about your life or the people around you (delusions and paranoia). DIAGNOSIS  The diagnosis of clinical depression is usually based on how bad the symptoms are and how long they have lasted. Your health care provider will also ask you questions about your medical history and substance use to find out if physical illness, use of prescription medicine, or substance abuse is causing your depression. Your health care provider may also order blood tests. TREATMENT  Often, normal sadness and normal grief do not require treatment. However, sometimes antidepressant medicine is given for bereavement to ease the depressive symptoms until they resolve. The treatment for clinical depression depends on how bad the symptoms are but often includes antidepressant medicine, counseling with a mental health professional, or both. Your health care provider will help to determine what treatment is best for you. Depression caused by physical illness usually goes away with appropriate medical treatment of the illness. If prescription medicine is causing depression, talk with your health care provider about stopping the medicine, decreasing the dose, or changing to another  medicine. Depression caused by the abuse of alcohol or illicit drugs goes away when you  stop using these substances. Some adults need professional help in order to stop drinking or using drugs. SEEK IMMEDIATE MEDICAL CARE IF:  You have thoughts about hurting yourself or others.  You lose touch with reality (have psychotic symptoms).  You are taking medicine for depression and have a serious side effect. FOR MORE INFORMATION  National Alliance on Mental Illness: www.nami.[employer] of Mental Health: https://carter.com/ Document Released: 01/08/2000 Document Revised: 05/27/2013 Document Reviewed: 04/11/2011 [facility] Patient Information 2015 [place], [place]. This information is not intended to replace advice given to you by your health care provider. Make sure you discuss any questions you have with your health care provider.   Emergency Department Resource Guide 1) Find a Doctor and Pay Out of Pocket Although you won't have to find out who is covered by your insurance plan, it is a good idea to ask around and get recommendations. You will then need to call the office and see if the doctor you have chosen will accept you as a new patient and what types of options they offer for patients who are self-pay. Some doctors offer discounts or will set up payment plans for their patients who do not have insurance, but you will need to ask so you aren't surprised when you get to your appointment.  2) Contact Your Local Health Department Not all health departments have doctors that can see patients for sick visits, but many do, so it is worth a call to see if yours does. If you don't know where your local health department is, you can check in your phone book. The CDC also has a tool to help you locate your state's health department, and many state websites also have listings of all of their local health departments.  3) Find a [facility] If your illness is not likely to be very severe or complicated, you may want to try a walk in clinic. These are popping up all over the  country in pharmacies, drugstores, and shopping centers. They're usually staffed by nurse practitioners or physician assistants that have been trained to treat common illnesses and complaints. They're usually fairly quick and inexpensive. However, if you have serious medical issues or chronic medical problems, these are probably not your best option.  No Primary Care Doctor: - Call Health Connect at  [phone] - they can help you locate a primary care doctor that  accepts your insurance, provides certain services, etc. - Physician Referral Service- [phone]  Chronic Pain Problems: Organization         Address  Phone   Notes  [facility]  [phone] Patients need to be referred by their primary care doctor.   Medication Assistance: Organization         Address  Phone   Notes  [facility] Medication [facility] [place] [place], [place] [address] [phone] --Must be a resident of [facility] -- Must have NO insurance coverage whatsoever (no Medicaid/ Medicare, etc.) -- The pt. MUST have a primary care doctor that directs their care regularly and follows them in the community   MedAssist  [phone]   [employer]  [phone]    Agencies that provide inexpensive medical care: Organization         Address  Phone   Notes  [place]  [phone]   [facility]  Internal Medicine    [phone]   [facility] [place], [place] [address] [phone]   [place] [address], [place] [phone]   Planned Parenthood    [phone]   [facility]    [phone]   [place] and [address], [place] Phone:  [phone], Fax:  [phone] Hours of Operation:  9 am - 6 pm, M-F.  Also accepts Medicaid/Medicare and self-pay.  [facility] for [place], [address], [place] Phone: [phone], Fax: [phone]. Hours of Operation:  8:30 am - 5:30 pm, M-F.  Also accepts Medicaid and self-pay.  [facility] [place] [address], [place] Phone: [phone]   [place], [place], [place] [phone], Ext. [phone] Mondays & Thursdays: 7-9 AM.  First 15 patients are seen on a first come, first serve basis.    [place] Providers:  Organization         Address  Phone   Notes  [facility] [address], [place] [phone] Also accepts self-pay patients.  [facility] [address], [place]  [phone]   [place], [address], [place] [phone]   [facility] [address], [place] [phone]   Lucianne Lei [address], [place]   [phone] Only accepts [place] Access [place] patients after they have their name applied to their card.   Self-Pay (no insurance) in [facility]:  Organization         Address  Phone   Notes  Sickle Cell Patients, [facility] Internal Medicine [place] [phone]   [facility] [place] [phone]   [facility] [place]  [place], [place], [place] [phone]   [facility]/Dr. Osei-Bonsu  [address] [place] or [address], [place] [phone] Phone number for both [place] and [place] locations is the same.  Urgent Medical and [facility] [address] [place] [phone]   [facility] [place] [address] [place] or [address] [phone]   [facility] [address] [place] [phone], phone; [phone], fax Sees patients 1st and 3rd Saturday of every month.  Must  not qualify for public or private insurance (i.e. Medicaid, Medicare, [place] Health Choice, Veterans' Benefits)  Household income should be no more than 200% of the poverty level The clinic cannot treat you if you are pregnant or think you are pregnant  Sexually transmitted diseases are not treated at the clinic.    Dental Care: Organization         Address  Phone  Notes  [facility] Department of [facility] [place] [phone] Accepts children up to age 87 who are enrolled in [place] or [place]; pregnant women with a Medicaid card; and children who have applied for Medicaid or [place] Health Choice, but were declined, whose parents can pay a reduced fee at time of service.  [facility] Department of [facility]  [address], [employer] (  [phone] Accepts children up to age 73 who are enrolled in Medicaid or [place]; pregnant women with a Medicaid card; and children who have applied for Medicaid or [place] Health Choice, but were declined, whose parents can pay a reduced fee at time of service.  [place] Adult Dental Access PROGRAM  [place] [phone] Patients are seen by appointment only. Walk-ins are not accepted. [place] will see patients 41 years of age and older. Monday - Tuesday (8am-5pm) Most Wednesdays (8:30-5pm) $30 per visit, cash only  [facility] Adult Dental Access PROGRAM  [address], [facility] [phone] Patients are seen by appointment only. Walk-ins are not accepted. [place] will see patients 62 years of age and older. One Wednesday Evening (Monthly: Volunteer Based).  $30 per visit, cash only  [place]  [phone] for adults; Children under age 57, call Graduate Pediatric Dentistry at [phone]. Children aged 47-14, please call [phone] to request a pediatric application.  Dental services are  provided in all areas of dental care including fillings, crowns and bridges, complete and partial dentures, implants, gum treatment, root canals, and extractions. Preventive care is also provided. Treatment is provided to both adults and children. Patients are selected via a lottery and there is often a waiting list.   [facility] [address] [place]  [phone] [email or web address].com   [facility] [address] [place], [place] [phone], Ext. [phone] Second and Fourth Thursday of each month, opens at 6:30 AM; Clinic ends at 9 AM.  Patients are seen on a first-come first-served basis, and a limited number are seen during each clinic.   [facility]  [address] Hillard Danker [place], [place] [phone]   Eligibility Requirements You must have lived in [place], [place], or [place] counties for at least the last three months.   You cannot be eligible for state or federal sponsored [employer], including [employer], [place], or [employer].   You generally cannot be eligible for healthcare insurance through your employer.    How to apply: Eligibility screenings are held every Tuesday and Wednesday afternoon from 1:00 pm until 4:00 pm. You do not need an appointment for the interview!  [facility] [address], [place], [place]   [place] Department  [place]  [phone]    Behavioral Health Resources in the Community: Intensive Outpatient Programs Organization         Address  Phone  Notes  [place]. [address], [place], [place] [phone]   [facility] [address] [place], [place]   ADS: Alcohol [facility] Drug S[facility] [address] [place], [place]   [place] [address]  [place], [place] or [phone]   Substance Abuse Resources Organization         Address  Phone  Notes  Alcohol and Drug Services  [phone]   [place]  [phone]   [facility] [place]   Chinita Pester  [phone]   Residential & Outpatient Substance Abuse Program  [phone]   Psychological Services Organization         Address  Phone  Notes  [place]  [place]  [phone]  [place] [address] [place] or [phone]    Mobile Crisis Teams Organization         Address  Phone  Notes  [facility], Mobile Crisis Care Unit  [phone]   [facility]  [address] [place], [place]   [place] [address] [place] [phone]    Self-Help/Support Groups Organization         Address  Phone             Notes  [place]. of [place] - variety of support groups  [place] Call for more information  Narcotics Anonymous (NA), [facility] [address], [employer] [place]  2 meetings at this location   Special educational needs teacher         Address  Phone  Notes  [facility] Residential Treatment [place],    [place]  [phone]   [facility]  [address], [place] [address], [place], [place]   [place], [place] [phone] Admissions: 8am-3pm M-F  Incentives Substance [place] [address]    [place], [place] [phone]   [facility] [address] [place], [place], [place]   [facility] [address]  [place], [place]   [facility] - Intensive Outpatient [address], [place], [place]   [facility] ([place] [address].,  [place], [place] [phone] or  [phone]   [facility] (RTS) [address], [place], [place] Accepts Medicaid  [facility] [place] [address],  [place] [phone] Substance Abuse/Addiction Treatment   [facility] Organization         Address  Phone  Notes  [facility]  [phone]   Domenic Schwab, PhD [address]lis [place], [place]   [phone] or [phone]   [place], [place] [phone]   [facility] [address] [place], [place] [phone] Insurance/Medicaid/sponsorship through [facility] and Families [address]                                    [place], [place] [phone] [place] [address], [place] [phone]    Dr. Adele Schilder  [phone]   [facility] Dept. 1) [address], [place] 2) [place] 3)  [address], [place] [phone]  [phone]   [place] [phone] or [phone] (After Hours)

## 2013-08-26 NOTE — ED Provider Notes (Signed)
CSN: [id number]     Arrival date & time 08/26/13  1321 History   First MD Initiated Contact with Patient 08/26/13 1545     Chief Complaint  Patient presents with  . Depression    HPI  Patient is a 38 yo with long standing history of depression who presents with depression and thoughts of paranoia.  She is engaged and has thoughts of paranoia/fear that her new family will not accept her. For the past month she has not taken her celexa and ambilfy. Her primary concern today is to find a new physician and establish care.  She reports decreased appetite, feelings of guilt, insomnia. She has tried [place] and it has worked for her. Patient denies suicidal ideation or thoughts of hurting herself or others. Her soon to be mother in law reports the pt has been increasingly fearful. She drinks alcohol and uses marijuana occasionally.  Past Medical History  Diagnosis Date  . Nerve damage   . Sleep apnea   . COPD (chronic obstructive pulmonary disease)   . Asthma   . Hypoglycemia associated with diabetes   . Insomnia   . Gallstones   . Social anxiety disorder   . Cancer   . DVT (deep venous thrombosis)     LLE, 04/2012, Xarelto anticoagulation initiated  . Depression   . Suicidal ideations   . Baker's cyst     left   . Anxiety    Past Surgical History  Procedure Laterality Date  . Abdominal hysterectomy    . Dilation and curettage of uterus     Family History  Problem Relation Age of Onset  . Arrhythmia Father    History  Substance Use Topics  . Smoking status: Current Every Day Smoker -- 1.00 packs/day    Types: Cigarettes  . Smokeless tobacco: Not on file     Comment: 1-2 packs per day, depending on nerves   . Alcohol Use: Yes     Comment: occ   OB History   Grav Para Term Preterm Abortions TAB SAB Ect Mult Living                 Review of Systems  Constitutional: Negative for fever and chills.  HENT: Negative for congestion.   Respiratory: Negative for cough and shortness  of breath.   Cardiovascular: Negative for chest pain.  Gastrointestinal: Negative for vomiting, diarrhea and constipation.  Genitourinary: Negative for frequency, hematuria and difficulty urinating.  Musculoskeletal: Negative for back pain and gait problem.  Skin: Negative for color change.  Neurological: Negative for weakness, numbness and headaches.  Psychiatric/Behavioral: Positive for sleep disturbance and decreased concentration. Negative for suicidal ideas, confusion and self-injury. The patient is nervous/anxious.       Allergies  Aspartame and phenylalanine; Bee venom; Clindamycin/lincomycin; Hydrocodone; and Slo-bid gyrocaps  Home Medications   Prior to Admission medications   Medication Sig Start Date End Date Taking? Authorizing Provider  albuterol (PROVENTIL) (2.5 MG/3ML) 0.083% nebulizer solution Take 3 mLs (2.5 mg total) by nebulization every 6 (six) hours as needed for wheezing or shortness of breath. 05/13/13  Yes Shanda Howells, MD  albuterol-ipratropium (COMBIVENT) 18-103 MCG/ACT inhaler Inhale 2 puffs into the lungs every 6 (six) hours as needed for wheezing. 05/22/13  Yes Lysbeth Penner, FNP  ARIPiprazole (ABILIFY) 10 MG tablet Take 1 tablet (10 mg total) by mouth 2 (two) times daily after a meal. For mood control 03/13/13  Yes Encarnacion Slates, NP  budesonide-formoterol (SYMBICORT) 80-4.5 MCG/ACT inhaler  Inhale 2 puffs into the lungs 2 (two) times daily. For shortness of breath 04/30/13  Yes Shanda Howells, MD  citalopram (CELEXA) 40 MG tablet Take 1 tablet (40 mg total) by mouth daily. For depression 03/13/13  Yes Encarnacion Slates, NP  fluticasone (FLONASE) 50 MCG/ACT nasal spray Place 1 spray into both nostrils at bedtime. 05/22/13  Yes Lysbeth Penner, FNP  hydrOXYzine (ATARAX/VISTARIL) 50 MG tablet Take 1 tablet (50 mg total) by mouth 3 (three) times daily as needed for anxiety or itching. 03/13/13  Yes Encarnacion Slates, NP  lubiprostone (AMITIZA) 24 MCG capsule Take 1 capsule (24  mcg total) by mouth 2 (two) times daily with a meal. 05/22/13  Yes Lysbeth Penner, FNP  omeprazole (PRILOSEC) 20 MG capsule Take 1 capsule (20 mg total) by mouth daily as needed (heartburn). 04/30/13  Yes Shanda Howells, MD  pravastatin (PRAVACHOL) 40 MG tablet Take 60 mg by mouth daily.   Yes Historical Provider, MD  triamterene-hydrochlorothiazide (DYAZIDE) 37.5-25 MG per capsule Take 1 capsule by mouth daily. For hypertension 03/13/13  Yes Encarnacion Slates, NP   BP 134/83  Pulse 89  Temp(Src) 98.2 F (36.8 C) (Oral)  Resp 14  SpO2 99% Physical Exam  Vitals reviewed. Constitutional: She is oriented to person, place, and time. She appears well-developed and well-nourished. No distress.  HENT:  Head: Normocephalic and atraumatic.  Eyes: Conjunctivae and EOM are normal.  Neck: Normal range of motion. Neck supple.  Cardiovascular: Normal rate and regular rhythm.   Pulmonary/Chest: Effort normal and breath sounds normal. No respiratory distress.  Abdominal: Soft. She exhibits no distension. There is no tenderness.  Musculoskeletal: Normal range of motion.  Neurological: She is alert and oriented to person, place, and time. She exhibits normal muscle tone.  Skin: Skin is warm and dry. She is not diaphoretic.  Psychiatric: Her speech is normal. Judgment normal. Her mood appears anxious. She is slowed and withdrawn. She is not agitated, not aggressive and not combative. Cognition and memory are normal. She exhibits a depressed mood. She expresses no suicidal ideation. She expresses no suicidal plans and no homicidal plans.    ED Course  Procedures (including critical care time) Labs Review Labs Reviewed  ACETAMINOPHEN LEVEL  CBC  COMPREHENSIVE METABOLIC PANEL  ETHANOL  SALICYLATE LEVEL  URINE RAPID DRUG SCREEN (HOSP PERFORMED)    Imaging Review No results found.   EKG Interpretation None      MDM   Final diagnoses:  Depression   Patient presents with longstanding history of  depression and is interested in establishing care with a provider. She denies homicidal ideation, suicidal ideation or harming herself or others. Doubt she is a immediate threat to herself or others. Discussed the importance of a strong relationship with a provider. Resources were provided.  Discussed return precautions with patient. Patient verbalizes understanding and agrees with plan.      Pura Spice, PA-C 08/26/13 2345

## 2013-08-26 NOTE — ED Provider Notes (Signed)
Medical screening examination/treatment/procedure(s) were performed by non-physician practitioner and as supervising physician I was immediately available for consultation/collaboration.    Johnna Acosta, MD 08/26/13 [phone]

## 2013-08-26 NOTE — ED Provider Notes (Signed)
38 year old female, history of depression and COPD who presents because she is not happy with her primary care doctor. She was to find somebody else in the family member who presents with her states that she has had some fluctuating mild depression but no signs of suicidal thoughts, no self injury, no comments of self-harm or erratic behavior. The patient has been mildly paranoid that her family will want to leave her because of her ongoing medical problems but has not had any significant hallucinations or paranoia or delusions. On exam the patient appears calm, and she gives a very clear history, she states that she is not a danger to herself or others I just wants referral to local doctors including family doctors and psychiatrist. Lungs and heart with normal exam, no distress, stable for discharge with resource list  Medical screening examination/treatment/procedure(s) were conducted as a shared visit with non-physician practitioner(s) and myself.  I personally evaluated the patient during the encounter.  Clinical Impression: Depression      Johnna Acosta, MD 08/26/13 2328

## 2013-08-26 NOTE — ED Notes (Signed)
Bed: [facility] Expected date:  Expected time:  Means of arrival:  Comments: 

## 2013-08-26 NOTE — ED Notes (Addendum)
Pt c/o increasing depression, anxiety, paranoia, and sleeplessness x 3 weeks. Denies pain.  Denies SI/HI/hallucinations.  Pt sts "I know I need to be on medication.  I haven't taken them for about a month."  Pt reports she is followed by [facility], but they are not providing any help.  Sts "I just need to be reevaluated."

## 2013-09-13 ENCOUNTER — Other Ambulatory Visit: Payer: Self-pay | Admitting: Family Medicine

## 2013-11-08 ENCOUNTER — Emergency Department (HOSPITAL_COMMUNITY)
Admission: EM | Admit: 2013-11-08 | Discharge: 2013-11-08 | Disposition: A | Discharge status: Home / Self Care | Payer: MEDICAID | Attending: Emergency Medicine | Admitting: Emergency Medicine

## 2013-11-08 ENCOUNTER — Encounter (HOSPITAL_COMMUNITY): Payer: Self-pay | Admitting: Emergency Medicine

## 2013-11-08 ENCOUNTER — Emergency Department (HOSPITAL_COMMUNITY): Payer: MEDICAID

## 2013-11-08 DIAGNOSIS — Z859 Personal history of malignant neoplasm, unspecified: Secondary | ICD-10-CM | POA: Insufficient documentation

## 2013-11-08 DIAGNOSIS — J449 Chronic obstructive pulmonary disease, unspecified: Secondary | ICD-10-CM | POA: Diagnosis not present

## 2013-11-08 DIAGNOSIS — Z79899 Other long term (current) drug therapy: Secondary | ICD-10-CM | POA: Insufficient documentation

## 2013-11-08 DIAGNOSIS — F329 Major depressive disorder, single episode, unspecified: Secondary | ICD-10-CM | POA: Insufficient documentation

## 2013-11-08 DIAGNOSIS — Z8669 Personal history of other diseases of the nervous system and sense organs: Secondary | ICD-10-CM | POA: Diagnosis not present

## 2013-11-08 DIAGNOSIS — Z86718 Personal history of other venous thrombosis and embolism: Secondary | ICD-10-CM | POA: Diagnosis not present

## 2013-11-08 DIAGNOSIS — Z720 Tobacco use: Secondary | ICD-10-CM | POA: Insufficient documentation

## 2013-11-08 DIAGNOSIS — Z872 Personal history of diseases of the skin and subcutaneous tissue: Secondary | ICD-10-CM | POA: Diagnosis not present

## 2013-11-08 DIAGNOSIS — Z7951 Long term (current) use of inhaled steroids: Secondary | ICD-10-CM | POA: Insufficient documentation

## 2013-11-08 DIAGNOSIS — F419 Anxiety disorder, unspecified: Secondary | ICD-10-CM | POA: Insufficient documentation

## 2013-11-08 DIAGNOSIS — Z8719 Personal history of other diseases of the digestive system: Secondary | ICD-10-CM | POA: Diagnosis not present

## 2013-11-08 DIAGNOSIS — E11649 Type 2 diabetes mellitus with hypoglycemia without coma: Secondary | ICD-10-CM | POA: Diagnosis not present

## 2013-11-08 DIAGNOSIS — F32A Depression, unspecified: Secondary | ICD-10-CM

## 2013-11-08 LAB — COMPREHENSIVE METABOLIC PANEL
ALT: 11 U/L (ref 0–35)
ANION GAP: 13 (ref 5–15)
AST: 13 U/L (ref 0–37)
Albumin: 3.3 g/dL — ABNORMAL LOW (ref 3.5–5.2)
Alkaline Phosphatase: 71 U/L (ref 39–117)
BILIRUBIN TOTAL: 0.3 mg/dL (ref 0.3–1.2)
BUN: 9 mg/dL (ref 6–23)
CALCIUM: 9.3 mg/dL (ref 8.4–10.5)
CO2: 25 meq/L (ref 19–32)
Chloride: 105 mEq/L (ref 96–112)
Creatinine, Ser: 0.76 mg/dL (ref 0.50–1.10)
Glucose, Bld: 127 mg/dL — ABNORMAL HIGH (ref 70–99)
Potassium: 3.9 mEq/L (ref 3.7–5.3)
Sodium: 143 mEq/L (ref 137–147)
Total Protein: 7.2 g/dL (ref 6.0–8.3)

## 2013-11-08 LAB — CBC
HCT: 47.9 % — ABNORMAL HIGH (ref 36.0–46.0)
Hemoglobin: 16 g/dL — ABNORMAL HIGH (ref 12.0–15.0)
MCH: 30.7 pg (ref 26.0–34.0)
MCHC: 33.4 g/dL (ref 30.0–36.0)
MCV: 91.9 fL (ref 78.0–100.0)
PLATELETS: 208 10*3/uL (ref 150–400)
RBC: 5.21 MIL/uL — ABNORMAL HIGH (ref 3.87–5.11)
RDW: 15.9 % — ABNORMAL HIGH (ref 11.5–15.5)
WBC: 11.1 10*3/uL — ABNORMAL HIGH (ref 4.0–10.5)

## 2013-11-08 LAB — ETHANOL

## 2013-11-08 LAB — SALICYLATE LEVEL

## 2013-11-08 LAB — ACETAMINOPHEN LEVEL: Acetaminophen (Tylenol), Serum: <15 ug/mL (ref 10–30)

## 2013-11-08 NOTE — Discharge Instructions (Signed)
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
## 2013-11-08 NOTE — BH Assessment (Signed)
Received a call from EDP-Dr. Daleen Bo requesting outpatient resources for this patient. No TTS consult order for this patient. Writer met with patient to discuss outpatient referrals available to her. Patient given a list of referrals in her community. Writer also made a standing out patient follow up appointment for this patient November 15, 2013 @ 9am at [facility] in [place].

## 2013-11-08 NOTE — ED Provider Notes (Signed)
CSN: [id number]     Arrival date & time 11/08/13  1309 History   First MD Initiated Contact with Patient 11/08/13 1508     Chief Complaint  Patient presents with  . Depression     (Consider location/radiation/quality/duration/timing/severity/associated sxs/prior Treatment) HPI Judith Rice is a 38 y.o. female who presents for evaluation and treatment of her depression. Her depression makes her cry at times. She is not currently suicidal, but has thought about suicide in the past, and denies a suicidal plan. She states that she is also not taking any of her medicines, which she takes for various reasons. She does not currently have a primary care provider, or therapist. She denies fever, chills, cough, nausea, vomiting, weakness, or dizziness. She uses marijuana, but does not use pain alcohol or heroin. She lives with her boyfriend's family, and his mother brought her here today. She is unemployed and states she is on disability for anxiety. There are no other known modifying factors.    Past Medical History  Diagnosis Date  . Nerve damage   . Sleep apnea   . COPD (chronic obstructive pulmonary disease)   . Asthma   . Hypoglycemia associated with diabetes   . Insomnia   . Gallstones   . Social anxiety disorder   . Cancer   . DVT (deep venous thrombosis)     LLE, 04/2012, Xarelto anticoagulation initiated  . Depression   . Suicidal ideations   . Baker's cyst     left   . Anxiety    Past Surgical History  Procedure Laterality Date  . Abdominal hysterectomy    . Dilation and curettage of uterus     Family History  Problem Relation Age of Onset  . Arrhythmia Father    History  Substance Use Topics  . Smoking status: Current Every Day Smoker -- 1.00 packs/day    Types: Cigarettes  . Smokeless tobacco: Not on file     Comment: 1-2 packs per day, depending on nerves   . Alcohol Use: Yes     Comment: occ   OB History   Grav Para Term Preterm Abortions TAB SAB Ect Mult  Living                 Review of Systems  All other systems reviewed and are negative.     Allergies  Aspartame and phenylalanine; Bee venom; Clindamycin/lincomycin; Hydrocodone; and Slo-bid gyrocaps  Home Medications   Prior to Admission medications   Medication Sig Start Date End Date Taking? Authorizing Provider  albuterol (PROVENTIL) (2.5 MG/3ML) 0.083% nebulizer solution Take 3 mLs (2.5 mg total) by nebulization every 6 (six) hours as needed for wheezing or shortness of breath. 05/13/13  Yes Shanda Howells, MD  albuterol-ipratropium (COMBIVENT) 18-103 MCG/ACT inhaler Inhale 2 puffs into the lungs every 6 (six) hours as needed for wheezing. 05/22/13  Yes Lysbeth Penner, FNP  ARIPiprazole (ABILIFY) 10 MG tablet Take 1 tablet (10 mg total) by mouth 2 (two) times daily after a meal. For mood control 03/13/13  Yes Encarnacion Slates, NP  budesonide-formoterol (SYMBICORT) 80-4.5 MCG/ACT inhaler Inhale 2 puffs into the lungs 2 (two) times daily. For shortness of breath 04/30/13  Yes Shanda Howells, MD  citalopram (CELEXA) 40 MG tablet Take 1 tablet (40 mg total) by mouth daily. For depression 03/13/13  Yes Encarnacion Slates, NP  fluticasone (FLONASE) 50 MCG/ACT nasal spray Place 1 spray into both nostrils at bedtime. 05/22/13  Yes Lysbeth Penner, FNP  hydrOXYzine (ATARAX/VISTARIL) 50 MG tablet Take 1 tablet (50 mg total) by mouth 3 (three) times daily as needed for anxiety or itching. 03/13/13  Yes Encarnacion Slates, NP  lubiprostone (AMITIZA) 24 MCG capsule Take 1 capsule (24 mcg total) by mouth 2 (two) times daily with a meal. 05/22/13  Yes Lysbeth Penner, FNP  omeprazole (PRILOSEC) 20 MG capsule Take 1 capsule (20 mg total) by mouth daily as needed (heartburn). 04/30/13  Yes Shanda Howells, MD  pravastatin (PRAVACHOL) 40 MG tablet Take 60 mg by mouth daily.   Yes Historical Provider, MD  triamterene-hydrochlorothiazide (DYAZIDE) 37.5-25 MG per capsule Take 1 capsule by mouth daily. For hypertension 03/13/13   Yes Encarnacion Slates, NP   BP 132/75  Pulse 74  Temp(Src) 98 F (36.7 C) (Oral)  Resp 16  SpO2 94% Physical Exam  Nursing note and vitals reviewed. Constitutional: She is oriented to person, place, and time. She appears well-developed and well-nourished.  HENT:  Head: Normocephalic and atraumatic.  Eyes: Conjunctivae and EOM are normal. Pupils are equal, round, and reactive to light.  Neck: Normal range of motion and phonation normal. Neck supple.  Cardiovascular: Normal rate and regular rhythm.   Pulmonary/Chest: Effort normal and breath sounds normal. She exhibits no tenderness.  Abdominal: Soft. She exhibits no distension. There is no tenderness. There is no guarding.  Musculoskeletal: Normal range of motion.  Neurological: She is alert and oriented to person, place, and time. She exhibits normal muscle tone.  Skin: Skin is warm and dry.  Psychiatric: Her behavior is normal. Judgment and thought content normal. Her affect is not blunt, not labile and not inappropriate. Her speech is not delayed, not tangential and not slurred. Thought content is not paranoid. Cognition and memory are not impaired. She expresses no suicidal plans and no homicidal plans. She exhibits normal recent memory.  She appears depressed    ED Course  Procedures (including critical care time)  15:25-I contacted TTS, who will evaluate the patient to assist with Out-patient Treatment.  Medications - No data to display  Patient Vitals for the past 24 hrs:  BP Temp Temp src Pulse Resp SpO2  11/08/13 1346 132/75 mmHg 98 F (36.7 C) Oral 74 16 94 %    3:28 PM Reevaluation with update and discussion. After initial assessment and treatment, an updated evaluation reveals seen by TTS, given outpatient resources. The patient is calm, alert, and bright now. [place] Review Labs Reviewed  CBC - Abnormal; Notable for the following:    WBC 11.1 (*)    RBC 5.21 (*)    Hemoglobin 16.0 (*)    HCT 47.9  (*)    RDW 15.9 (*)    All other components within normal limits  COMPREHENSIVE METABOLIC PANEL - Abnormal; Notable for the following:    Glucose, Bld 127 (*)    Albumin 3.3 (*)    All other components within normal limits  SALICYLATE LEVEL - Abnormal; Notable for the following:    Salicylate Lvl <1.2 (*)    All other components within normal limits  ACETAMINOPHEN LEVEL  ETHANOL  URINE RAPID DRUG SCREEN (HOSP PERFORMED)    Imaging Review Dg Chest 2 View  11/08/2013   CLINICAL DATA:  Initial evaluation for depression, history of hypertension COPD and smoking  EXAM: CHEST  2 VIEW  COMPARISON:  05/22/2013  FINDINGS: The heart size and mediastinal contours are within normal limits. Both lungs are clear. The visualized skeletal structures are  unremarkable.  IMPRESSION: No active cardiopulmonary disease.   Electronically Signed   By: Skipper Cliche M.D.   On: 11/08/2013 16:09     EKG Interpretation None      MDM   Final diagnoses:  Depression    Depression without suicidal ideation. She is stable for discharge with outpatient treatment. She has an appointment scheduled for one week's time with a agency in [place].  Nursing Notes Reviewed/ Care Coordinated Applicable Imaging Reviewed Interpretation of Laboratory Data incorporated into ED treatment  The patient appears reasonably screened and/or stabilized for discharge and I doubt any other medical condition or other [facility] requiring further screening, evaluation, or treatment in the ED at this time prior to discharge.  Plan: Home Medications- none; Home Treatments- rest, stop smoking; return here if the recommended treatment, does not improve the symptoms; Recommended follow up- OP psych. F/u next week     Richarda Blade, MD 11/08/13 [phone]

## 2013-12-31 ENCOUNTER — Telehealth: Payer: Self-pay | Admitting: Family Medicine

## 2014-02-24 NOTE — Telephone Encounter (Signed)
Explained to patient that we were not able to change the dismissed status.  She understood.

## 2014-03-31 ENCOUNTER — Telehealth: Payer: Self-pay | Admitting: Family Medicine

## 2014-03-31 NOTE — Telephone Encounter (Signed)
Patient aware that she has been dismissed from our practice and she will need to speak with someone in billing before we can reconsider her reestablishing care at our practice

## 2014-06-05 ENCOUNTER — Ambulatory Visit (HOSPITAL_COMMUNITY)
Admission: RE | Admit: 2014-06-05 | Discharge: 2014-06-05 | Disposition: A | Discharge status: Home / Self Care | Payer: MEDICAID | Attending: Psychiatry | Admitting: Psychiatry

## 2014-06-05 DIAGNOSIS — Z91410 Personal history of adult physical and sexual abuse: Secondary | ICD-10-CM | POA: Insufficient documentation

## 2014-06-05 DIAGNOSIS — F12929 Cannabis use, unspecified with intoxication, unspecified: Secondary | ICD-10-CM | POA: Diagnosis not present

## 2014-06-05 DIAGNOSIS — F4010 Social phobia, unspecified: Secondary | ICD-10-CM | POA: Diagnosis not present

## 2014-06-05 DIAGNOSIS — Z91411 Personal history of adult psychological abuse: Secondary | ICD-10-CM | POA: Insufficient documentation

## 2014-06-05 DIAGNOSIS — F331 Major depressive disorder, recurrent, moderate: Secondary | ICD-10-CM | POA: Insufficient documentation

## 2014-06-05 DIAGNOSIS — F17210 Nicotine dependence, cigarettes, uncomplicated: Secondary | ICD-10-CM | POA: Insufficient documentation

## 2014-06-05 DIAGNOSIS — F410 Panic disorder [episodic paroxysmal anxiety] without agoraphobia: Secondary | ICD-10-CM | POA: Diagnosis present
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
## 2015-08-09 ENCOUNTER — Encounter (HOSPITAL_COMMUNITY): Payer: Self-pay | Admitting: Emergency Medicine

## 2015-08-09 ENCOUNTER — Ambulatory Visit (HOSPITAL_COMMUNITY)
Admission: EM | Admit: 2015-08-09 | Discharge: 2015-08-09 | Disposition: A | Discharge status: Home / Self Care | Payer: Medicaid Other | Attending: Emergency Medicine | Admitting: Emergency Medicine

## 2015-08-09 DIAGNOSIS — Z720 Tobacco use: Secondary | ICD-10-CM | POA: Diagnosis not present

## 2015-08-09 DIAGNOSIS — I10 Essential (primary) hypertension: Secondary | ICD-10-CM

## 2015-08-09 DIAGNOSIS — R609 Edema, unspecified: Secondary | ICD-10-CM

## 2015-08-09 DIAGNOSIS — F419 Anxiety disorder, unspecified: Secondary | ICD-10-CM

## 2015-08-09 DIAGNOSIS — R0789 Other chest pain: Secondary | ICD-10-CM

## 2015-08-09 DIAGNOSIS — J441 Chronic obstructive pulmonary disease with (acute) exacerbation: Secondary | ICD-10-CM

## 2015-08-09 DIAGNOSIS — M940 Chondrocostal junction syndrome [Tietze]: Secondary | ICD-10-CM

## 2015-08-09 MED ORDER — TRIAMCINOLONE ACETONIDE 40 MG/ML IJ SUSP
INTRAMUSCULAR | Status: AC
  Filled 2015-08-09: qty 1

## 2015-08-09 MED ORDER — ALBUTEROL SULFATE (2.5 MG/3ML) 0.083% IN NEBU
2.5000 mg | INHALATION_SOLUTION | Freq: Once | RESPIRATORY_TRACT | Status: AC
  Administered 2015-08-09: 2.5 mg via RESPIRATORY_TRACT

## 2015-08-09 MED ORDER — ALBUTEROL SULFATE (2.5 MG/3ML) 0.083% IN NEBU
INHALATION_SOLUTION | RESPIRATORY_TRACT | Status: AC
  Filled 2015-08-09: qty 3

## 2015-08-09 MED ORDER — IPRATROPIUM-ALBUTEROL 0.5-2.5 (3) MG/3ML IN SOLN
3.0000 mL | Freq: Once | RESPIRATORY_TRACT | Status: AC
  Administered 2015-08-09: 3 mL via RESPIRATORY_TRACT

## 2015-08-09 MED ORDER — TRIAMCINOLONE ACETONIDE 40 MG/ML IJ SUSP
60.0000 mg | Freq: Once | INTRAMUSCULAR | Status: AC
  Administered 2015-08-09: 60 mg via INTRAMUSCULAR

## 2015-08-09 MED ORDER — IPRATROPIUM-ALBUTEROL 0.5-2.5 (3) MG/3ML IN SOLN
RESPIRATORY_TRACT | Status: AC
  Filled 2015-08-09: qty 3

## 2015-08-09 NOTE — ED Notes (Signed)
Pt c/o CP onset 0415 today associated HA (7/10), blurred vision, HTN, n/v... Also reports CP radiates to left arm/shoulder Family at bedside.... Brought back on wheelchair... A&O x4... NAD

## 2015-08-09 NOTE — Discharge Instructions (Signed)
Chest Wall Pain Chest wall pain is pain in or around the bones and muscles of your chest. Sometimes, an injury causes this pain. Sometimes, the cause may not be known. This pain may take several weeks or longer to get better. HOME CARE INSTRUCTIONS  Pay attention to any changes in your symptoms. Take these actions to help with your pain:   Rest as told by your health care provider.   Avoid activities that cause pain. These include any activities that use your chest muscles or your abdominal and side muscles to lift heavy items.   If directed, apply ice to the painful area:  Put ice in a plastic bag.  Place a towel between your skin and the bag.  Leave the ice on for 20 minutes, 2-3 times per day.  Take over-the-counter and prescription medicines only as told by your health care provider.  Do not use tobacco products, including cigarettes, chewing tobacco, and e-cigarettes. If you need help quitting, ask your health care provider.  Keep all follow-up visits as told by your health care provider. This is important. SEEK MEDICAL CARE IF:  You have a fever.  Your chest pain becomes worse.  You have new symptoms. SEEK IMMEDIATE MEDICAL CARE IF:  You have nausea or vomiting.  You feel sweaty or light-headed.  You have a cough with phlegm (sputum) or you cough up blood.  You develop shortness of breath.   This information is not intended to replace advice given to you by your health care provider. Make sure you discuss any questions you have with your health care provider.   Document Released: 01/10/2005 Document Revised: 10/01/2014 Document Reviewed: 04/07/2014 Elsevier Interactive Patient Education 2016 [employer]  Chronic Obstructive Pulmonary Disease Exacerbation Chronic obstructive pulmonary disease (COPD) is a common lung problem. In COPD, the flow of air from the lungs is limited. COPD exacerbations are times that breathing gets worse and you need extra treatment.  Without treatment they can be life threatening. If they happen often, your lungs can become more damaged. If your COPD gets worse, your doctor may treat you with:  Medicines.  Oxygen.  Different ways to clear your airway, such as using a mask. HOME CARE  Do not smoke.  Avoid tobacco smoke and other things that bother your lungs.  If given, take your antibiotic medicine as told. Finish the medicine even if you start to feel better.  Only take medicines as told by your doctor.  Drink enough fluids to keep your pee (urine) clear or pale yellow (unless your doctor has told you not to).  Use a cool mist machine (vaporizer).  If you use oxygen or a machine that turns liquid medicine into a mist (nebulizer), continue to use them as told.  Keep up with shots (vaccinations) as told by your doctor.  Exercise regularly.  Eat healthy foods.  Keep all doctor visits as told. GET HELP RIGHT AWAY IF:  You are very short of breath and it gets worse.  You have trouble talking.  You have bad chest pain.  You have blood in your spit (sputum).  You have a fever.  You keep throwing up (vomiting).  You feel weak, or you pass out (faint).  You feel confused.  You keep getting worse. MAKE SURE YOU:  Understand these instructions.  Will watch your condition.  Will get help right away if you are not doing well or get worse.   This information is not intended to replace advice given  to you by your health care provider. Make sure you discuss any questions you have with your health care provider.   Document Released: 12/30/2010 Document Revised: 01/31/2014 Document Reviewed: 09/14/2012 Elsevier Interactive Patient Education 2016 [employer]  Costochondritis Costochondritis is a condition in which the tissue (cartilage) that connects your ribs with your breastbone (sternum) becomes irritated. It causes pain in the chest and rib area. It usually goes away on its own over time. HOME  CARE  Avoid activities that wear you out.  Do not strain your ribs. Avoid activities that use your:  Chest.  Belly.  Side muscles.  Put ice on the area for the first 2 days after the pain starts.  Put ice in a plastic bag.  Place a towel between your skin and the bag.  Leave the ice on for 20 minutes, 2-3 times a day.  Only take medicine as told by your doctor. GET HELP IF:  You have redness or puffiness (swelling) in the rib area.  Your pain does not go away with rest or medicine. GET HELP RIGHT AWAY IF:   Your pain gets worse.  You are very uncomfortable.  You have trouble breathing.  You cough up blood.  You start sweating or throwing up (vomiting).  You have a fever or lasting symptoms for more than 2-3 days.  You have a fever and your symptoms suddenly get worse. MAKE SURE YOU:   Understand these instructions.  Will watch your condition.  Will get help right away if you are not doing well or get worse.   This information is not intended to replace advice given to you by your health care provider. Make sure you discuss any questions you have with your health care provider.   Document Released: 06/29/2007 Document Revised: 09/12/2012 Document Reviewed: 08/14/2012 Elsevier Interactive Patient Education 2016 [employer]  Edema Edema is an abnormal buildup of fluids. It is more common in your legs and thighs. Painless swelling of the feet and ankles is more likely as a person ages. It also is common in looser skin, like around your eyes. HOME CARE   Keep the affected body part above the level of the heart while lying down.  Do not sit still or stand for a long time.  Do not put anything right under your knees when you lie down.  Do not wear tight clothes on your upper legs.  Exercise your legs to help the puffiness (swelling) go down.  Wear elastic bandages or support stockings as told by your doctor.  A low-salt diet may help lessen the  puffiness.  Only take medicine as told by your doctor. GET HELP IF:  Treatment is not working.  You have heart, liver, or kidney disease and notice that your skin looks puffy or shiny.  You have puffiness in your legs that does not get better when you raise your legs.  You have sudden weight gain for no reason. GET HELP RIGHT AWAY IF:   You have shortness of breath or chest pain.  You cannot breathe when you lie down.  You have pain, redness, or warmth in the areas that are puffy.  You have heart, liver, or kidney disease and get edema all of a sudden.  You have a fever and your symptoms get worse all of a sudden. MAKE SURE YOU:   Understand these instructions.  Will watch your condition.  Will get help right away if you are not doing well or get worse.  This information is not intended to replace advice given to you by your health care provider. Make sure you discuss any questions you have with your health care provider.   Document Released: 06/29/2007 Document Revised: 01/15/2013 Document Reviewed: 11/02/2012 Elsevier Interactive Patient Education 2016 [employer]  Generalized Anxiety Disorder Generalized anxiety disorder (GAD) is a mental disorder. It interferes with life functions, including relationships, work, and school. GAD is different from normal anxiety, which everyone experiences at some point in their lives in response to specific life events and activities. Normal anxiety actually helps [place] prepare for and get through these life events and activities. Normal anxiety goes away after the event or activity is over.  GAD causes anxiety that is not necessarily related to specific events or activities. It also causes excess anxiety in proportion to specific events or activities. The anxiety associated with GAD is also difficult to control. GAD can vary from mild to severe. People with severe GAD can have intense waves of anxiety with physical symptoms (panic  attacks).  SYMPTOMS The anxiety and worry associated with GAD are difficult to control. This anxiety and worry are related to many life events and activities and also occur more days than not for 6 months or longer. People with GAD also have three or more of the following symptoms (one or more in children):  Restlessness.   Fatigue.  Difficulty concentrating.   Irritability.  Muscle tension.  Difficulty sleeping or unsatisfying sleep. DIAGNOSIS GAD is diagnosed through an assessment by your health care provider. Your health care provider will ask you questions aboutyour mood,physical symptoms, and events in your life. Your health care provider may ask you about your medical history and use of alcohol or drugs, including prescription medicines. Your health care provider may also do a physical exam and blood tests. Certain medical conditions and the use of certain substances can cause symptoms similar to those associated with GAD. Your health care provider may refer you to a mental health specialist for further evaluation. TREATMENT The following therapies are usually used to treat GAD:   Medication. Antidepressant medication usually is prescribed for long-term daily control. Antianxiety medicines may be added in severe cases, especially when panic attacks occur.   Talk therapy (psychotherapy). Certain types of talk therapy can be helpful in treating GAD by providing support, education, and guidance. A form of talk therapy called cognitive behavioral therapy can teach you healthy ways to think about and react to daily life events and activities.  Stress managementtechniques. These include yoga, meditation, and exercise and can be very helpful when they are practiced regularly. A mental health specialist can help determine which treatment is best for you. Some people see improvement with one therapy. However, other people require a combination of therapies.   This information is not  intended to replace advice given to you by your health care provider. Make sure you discuss any questions you have with your health care provider.   Document Released: 05/07/2012 Document Revised: 01/31/2014 Document Reviewed: 05/07/2012 Elsevier Interactive Patient Education 2016 [employer].  Hypertension Hypertension is another name for high blood pressure. High blood pressure forces your heart to work harder to pump blood. A blood pressure reading has two numbers, which includes a higher number over a lower number (example: 110/72). HOME CARE   Have your blood pressure rechecked by your doctor.  Only take medicine as told by your doctor. Follow the directions carefully. The medicine does not work as well if you skip doses.  Skipping doses also puts you at risk for problems.  Do not smoke.  Monitor your blood pressure at home as told by your doctor. GET HELP IF:  You think you are having a reaction to the medicine you are taking.  You have repeat headaches or feel dizzy.  You have puffiness (swelling) in your ankles.  You have trouble with your vision. GET HELP RIGHT AWAY IF:   You get a very bad headache and are confused.  You feel weak, numb, or faint.  You get chest or belly (abdominal) pain.  You throw up (vomit).  You cannot breathe very well. MAKE SURE YOU:   Understand these instructions.  Will watch your condition.  Will get help right away if you are not doing well or get worse.   This information is not intended to replace advice given to you by your health care provider. Make sure you discuss any questions you have with your health care provider.   Document Released: 06/29/2007 Document Revised: 01/15/2013 Document Reviewed: 11/02/2012 Elsevier Interactive Patient Education 2016 [place].  Obesity Obesity is having too much body fat and a body mass index (BMI) of 30 or more. BMI is a number that is based on your height and weight. BMI is usually  figured out by your doctor during regular wellness visits. The number is an estimate of how much body fat you have. Obesity can happen if you eat more calories than you can burn with exercise or other activity. It can cause major health problems or emergencies.  HOME CARE  Exercise and be active as told by your doctor. Try:  Using stairs when you can.  Parking farther away from store doors.  Gardening, biking, or walking.  Eat healthy foods and drinks that are low in calories. Eat more fruits and vegetables.  Limit fast food, sweets, and snack foods that are made with ingredients that are not natural (processed food).  Eat smaller amounts of food.  Keep a journal and write down what you eat every day. Websites can help with this.  Avoid drinking alcohol. Drink more water and drinks that have no calories.  Take vitamins and dietary pills (supplements) only as told by your doctor.  Try going to weight-loss support groups or classes. These can help to lessen stress. Dietitians and counselors may also help. GET HELP RIGHT AWAY IF:  You have pain or tightness in your chest.  You have trouble breathing or feel short of breath.  You feel weak or have loss of feeling (numbness) in your legs.  You feel confused or have trouble talking.  You have sudden changes in your vision.   This information is not intended to replace advice given to you by your health care provider. Make sure you discuss any questions you have with your health care provider.   Document Released: 04/04/2011 Document Revised: 01/31/2014 Document Reviewed: 04/04/2011 Elsevier Interactive Patient Education 2016 [employer].  Peripheral Edema You have swelling in your legs (peripheral edema). This swelling is due to excess accumulation of salt and water in your body. Edema may be a sign of heart, kidney or liver disease, or a side effect of a medication. It may also be due to problems in the leg veins. Elevating  your legs and using special support stockings may be very helpful, if the cause of the swelling is due to poor venous circulation. Avoid long periods of standing, whatever the cause. Treatment of edema depends on identifying the cause. Chips,  pretzels, pickles and other salty foods should be avoided. Restricting salt in your diet is almost always needed. Water pills (diuretics) are often used to remove the excess salt and water from your body via urine. These medicines prevent the kidney from reabsorbing sodium. This increases urine flow. Diuretic treatment may also result in lowering of potassium levels in your body. Potassium supplements may be needed if you have to use diuretics daily. Daily weights can help you keep track of your progress in clearing your edema. You should call your caregiver for follow up care as recommended. SEEK IMMEDIATE MEDICAL CARE IF:   You have increased swelling, pain, redness, or heat in your legs.  You develop shortness of breath, especially when lying down.  You develop chest or abdominal pain, weakness, or fainting.  You have a fever.   This information is not intended to replace advice given to you by your health care provider. Make sure you discuss any questions you have with your health care provider.   Document Released: 02/18/2004 Document Revised: 04/04/2011 Document Reviewed: 07/23/2014 Elsevier Interactive Patient Education 2016 [employer].  Smoking Hazards Smoking cigarettes is extremely bad for your health. Tobacco smoke has over 200 known poisons in it. It contains the poisonous gases nitrogen oxide and carbon monoxide. There are over 60 chemicals in tobacco smoke that cause cancer. Some of the chemicals found in cigarette smoke include:   Cyanide.   Benzene.   Formaldehyde.   Methanol (wood alcohol).   Acetylene (fuel used in welding torches).   Ammonia.  Even smoking lightly shortens your life expectancy by several years. You can  greatly reduce the risk of medical problems for you and your family by stopping now. Smoking is the most preventable cause of death and disease in our society. Within days of quitting smoking, your circulation improves, you decrease the risk of having a heart attack, and your lung capacity improves. There may be some increased phlegm in the first few days after quitting, and it may take months for your lungs to clear up completely. Quitting for 10 years reduces your risk of developing lung cancer to almost that of a nonsmoker.  WHAT ARE THE RISKS OF SMOKING? Cigarette smokers have an increased risk of many serious medical problems, including:  Lung cancer.   Lung disease (such as pneumonia, bronchitis, and emphysema).   Heart attack and chest pain due to the heart not getting enough oxygen (angina).   Heart disease and peripheral blood vessel disease.   Hypertension.   Stroke.   Oral cancer (cancer of the lip, mouth, or voice box).   Bladder cancer.   Pancreatic cancer.   Cervical cancer.   Pregnancy complications, including premature birth.   Stillbirths and smaller newborn babies, birth defects, and genetic damage to sperm.   Early menopause.   Lower estrogen level for women.   Infertility.   Facial wrinkles.   Blindness.   Increased risk of broken bones (fractures).   Senile dementia.   Stomach ulcers and internal bleeding.   Delayed wound healing and increased risk of complications during surgery. Because of secondhand smoke exposure, children of smokers have an increased risk of the following:   Sudden infant death syndrome (SIDS).   Respiratory infections.   Lung cancer.   Heart disease.   Ear infections.  WHY IS SMOKING ADDICTIVE? Nicotine is the chemical agent in tobacco that is capable of causing addiction or dependence. When you smoke and inhale, nicotine is absorbed rapidly into the  bloodstream through your lungs. Both  inhaled and noninhaled nicotine may be addictive.  WHAT ARE THE BENEFITS OF QUITTING?  There are many health benefits to quitting smoking. Some are:   The likelihood of developing cancer and heart disease decreases. Health improvements are seen almost immediately.   Blood pressure, pulse rate, and breathing patterns start returning to normal soon after quitting.   People who quit may see an improvement in their overall quality of life.  HOW DO YOU QUIT SMOKING? Smoking is an addiction with both physical and psychological effects, and longtime habits can be hard to change. Your health care provider can recommend:  Programs and community resources, which may include group support, education, or therapy.  Replacement products, such as patches, gum, and nasal sprays. Use these products only as directed. Do not replace cigarette smoking with electronic cigarettes (commonly called e-cigarettes). The safety of e-cigarettes is unknown, and some may contain harmful chemicals. FOR MORE INFORMATION  American Lung Association: www.lung.org  American Cancer Society: www.cancer.org   This information is not intended to replace advice given to you by your health care provider. Make sure you discuss any questions you have with your health care provider.   Document Released: 02/18/2004 Document Revised: 10/31/2012 Document Reviewed: 07/02/2012 Elsevier Interactive Patient Education [employer].

## 2015-08-09 NOTE — ED Provider Notes (Signed)
CSN: [id number]     Arrival date & time 08/09/15  1247 History   First MD Initiated Contact with Patient 08/09/15 1422     Chief Complaint  Patient presents with  . Chest Pain   (Consider location/radiation/quality/duration/timing/severity/associated sxs/prior Treatment) HPI Comments: Morbidly obese female with a history of severe anxiety, diabetes, COPD and continues to smoke a pack a day, sleep apnea, social anxiety disorder, DVT presents with a complaint of hypertension readings on her home monitor. She is currently taking Norvasc for hypertension and this was recently decreased. Shortly after the increase in the dose of Norvasc she developed bilateral peripheral edema. She called her PCP and he was advised of the elevated blood pressure readings and was told by him to go to the urgent care or emergency department. Her current blood pressure reading is 139 radiate with a pulse of 77. She is also complaining of chest pain that is located along the left sternal border, the left costal margin and then rating superiorly along the left ribs to the left anterior shoulder. Movement and taking a deep breath tends to exacerbate the pain. She has taken a few puffs of her albuterol inhaler for her wheezing only.   Past Medical History  Diagnosis Date  . Nerve damage   . Sleep apnea   . COPD (chronic obstructive pulmonary disease) ([place])   . Asthma   . Hypoglycemia associated with diabetes ([place])   . Insomnia   . Gallstones   . Social anxiety disorder   . Cancer ([place])   . DVT (deep venous thrombosis) ([facility])     LLE, 04/2012, Xarelto anticoagulation initiated  . Depression   . Suicidal ideations   . Baker's cyst     left   . Anxiety    Past Surgical History  Procedure Laterality Date  . Abdominal hysterectomy    . Dilation and curettage of uterus     Family History  Problem Relation Age of Onset  . Arrhythmia Father    Social History  Substance Use Topics  . Smoking status: Current Every  Day Smoker -- 1.00 packs/day    Types: Cigarettes  . Smokeless tobacco: None     Comment: 1-2 packs per day, depending on nerves   . Alcohol Use: Yes     Comment: occ   OB History    No data available     Review of Systems  Constitutional: Positive for activity change, appetite change and fatigue.  HENT: Positive for sore throat. Negative for congestion, postnasal drip, rhinorrhea and trouble swallowing.        Ear popping and discomfort.  Eyes: Negative.  Negative for visual disturbance.  Respiratory: Positive for cough, shortness of breath and wheezing.   Cardiovascular: Positive for chest pain and leg swelling.  Gastrointestinal: Negative.   Genitourinary: Negative.   Musculoskeletal: Positive for myalgias.  Skin: Negative for color change and wound.  Neurological: Positive for headaches. Negative for syncope and speech difficulty.  Psychiatric/Behavioral: The patient is nervous/anxious.   All other systems reviewed and are negative.   Allergies  Aspartame and phenylalanine; Bee venom; Clindamycin/lincomycin; Hydrocodone; and Slo-bid gyrocaps  Home Medications   Prior to Admission medications   Medication Sig Start Date End Date Taking? Authorizing Provider  albuterol (PROVENTIL) (2.5 MG/3ML) 0.083% nebulizer solution Take 3 mLs (2.5 mg total) by nebulization every 6 (six) hours as needed for wheezing or shortness of breath. 05/13/13  Yes Deneise Lever, MD  amLODipine (NORVASC) 10 MG tablet Take  10 mg by mouth daily.   Yes Historical Provider, MD  baclofen (LIORESAL) 20 MG tablet Take 20 mg by mouth 3 (three) times daily.   Yes Historical Provider, MD  busPIRone (BUSPAR) 15 MG tablet Take 15 mg by mouth 3 (three) times daily.   Yes Historical Provider, MD  gabapentin (NEURONTIN) 400 MG capsule Take 400 mg by mouth 3 (three) times daily.   Yes Historical Provider, MD  hydrOXYzine (ATARAX/VISTARIL) 50 MG tablet Take 1 tablet (50 mg total) by mouth 3 (three) times daily as  needed for anxiety or itching. 03/13/13  Yes Encarnacion Slates, NP  meloxicam (MOBIC) 15 MG tablet Take 15 mg by mouth daily.   Yes Historical Provider, MD  omeprazole (PRILOSEC) 20 MG capsule Take 1 capsule (20 mg total) by mouth daily as needed (heartburn). 04/30/13  Yes Deneise Lever, MD  oxcarbazepine (TRILEPTAL) 600 MG tablet Take 600 mg by mouth 2 (two) times daily.   Yes Historical Provider, MD  pravastatin (PRAVACHOL) 40 MG tablet Take 60 mg by mouth daily.   Yes Historical Provider, MD  prazosin (MINIPRESS) 5 MG capsule Take 5 mg by mouth at bedtime.   Yes Historical Provider, MD  venlafaxine XR (EFFEXOR-XR) 150 MG 24 hr capsule Take 150 mg by mouth daily with breakfast.   Yes Historical Provider, MD  albuterol-ipratropium (COMBIVENT) 18-103 MCG/ACT inhaler Inhale 2 puffs into the lungs every 6 (six) hours as needed for wheezing. 05/22/13   Lysbeth Penner, FNP  ARIPiprazole (ABILIFY) 10 MG tablet Take 1 tablet (10 mg total) by mouth 2 (two) times daily after a meal. For mood control 03/13/13   Encarnacion Slates, NP  budesonide-formoterol (SYMBICORT) 80-4.5 MCG/ACT inhaler Inhale 2 puffs into the lungs 2 (two) times daily. For shortness of breath 04/30/13   Deneise Lever, MD  citalopram (CELEXA) 40 MG tablet Take 1 tablet (40 mg total) by mouth daily. For depression 03/13/13   Encarnacion Slates, NP  EPINEPHrine (EPIPEN IJ) Inject as directed.    Historical Provider, MD  fluticasone (FLONASE) 50 MCG/ACT nasal spray Place 1 spray into both nostrils at bedtime. 05/22/13   Lysbeth Penner, FNP  lubiprostone (AMITIZA) 24 MCG capsule Take 1 capsule (24 mcg total) by mouth 2 (two) times daily with a meal. 05/22/13   Lysbeth Penner, FNP  topiramate (TOPAMAX) 50 MG tablet Take 50 mg by mouth 2 (two) times daily.    Historical Provider, MD  triamterene-hydrochlorothiazide (DYAZIDE) 37.5-25 MG per capsule Take 1 capsule by mouth daily. For hypertension 03/13/13   Encarnacion Slates, NP   Meds Ordered and Administered  this Visit   Medications  ipratropium-albuterol (DUONEB) 0.5-2.5 (3) MG/3ML nebulizer solution 3 mL (3 mLs Nebulization Given 08/09/15 1529)  albuterol (PROVENTIL) (2.5 MG/3ML) 0.083% nebulizer solution 2.5 mg (2.5 mg Nebulization Given 08/09/15 1529)  triamcinolone acetonide (KENALOG-40) injection 60 mg (60 mg Intramuscular Given 08/09/15 1526)    BP 139/88 mmHg  Pulse 77  Temp(Src) 98.2 F (36.8 C) (Oral)  Resp 16  SpO2 97% No data found.   Physical Exam  Constitutional: She is oriented to person, place, and time.  Morbidly obese. Patient is sitting on the end of the exam table and coloring a piece of paper. She states she does this to help relieve anxiety. She has relaxed posturing and does not appear to be in any distress.  HENT:  Head: Normocephalic and atraumatic.  Mouth/Throat: No oropharyngeal exudate.  Bilateral TMs are pearly gray  and translucent. Minor retraction to the left. Otherwise normal.  Oropharynx with minor erythema, cobblestoning and clear PND.  Eyes: Conjunctivae and EOM are normal.  Neck: Normal range of motion. Neck supple.  Cardiovascular: Normal rate, regular rhythm, normal heart sounds and intact distal pulses.   Pulmonary/Chest:  Respirations with bilateral diffuse expiratory wheezing and decreased air movement. Prolonged expiratory phase. Decreased chest expansion in part due to body habitus and COPD.  Musculoskeletal: She exhibits edema.  Lower extremity peripheral edema.  Lymphadenopathy:    She has no cervical adenopathy.  Neurological: She is alert and oriented to person, place, and time. She exhibits normal muscle tone.  Skin: Skin is warm and dry. No rash noted. No erythema.  Nursing note and vitals reviewed.   ED Course  Procedures (including critical care time)  Labs Review Labs Reviewed - No data to display  Imaging Review No results found. ED ECG REPORT   Date: 08/09/2015  Rate: 71  Rhythm: normal sinus rhythm  QRS Axis:  leftward  Intervals: normal  ST/T Wave abnormalities: early repolarization, T wave inversion III only.   Conduction Disutrbances:none  Narrative Interpretation: no ectopy. Unchanged from previous 05/13/2013  Old EKG Reviewed: unchanged  I have personally reviewed the EKG tracing and agree with the computerized printout as noted.   Visual Acuity Review  Right Eye Distance:   Left Eye Distance:   Bilateral Distance:    Right Eye Near:   Left Eye Near:    Bilateral Near:         MDM   1. COPD exacerbation ([place])   2. Tobacco abuse disorder   3. Peripheral edema   4. Essential hypertension   5. Chest wall pain   6. Costochondritis, acute   7. Anxiety   8. Morbid obesity due to excess calories ([place])     Post DuoNeb patient's lungs are clear wheezing. Few fine faint crackles on the left base. There movement is much improved. She states she felt like she is breathing better.  She has an appointment with her doctor in 2 days. She is to discuss with him medication changes involving the amlodipine which is likely contributing to her peripheral edema, discussion of the "bruises" to the lower extremities, the sensation of cold feet despite objective palpation of them being warm, chest wall pain, breathing problems, refills of chronic medications and inhalers. She has plenty up her aware at home. She is currently stable. Vital signs are normal. Instructions given to the patient on each of her diagnoses. Her breathing has significantly improved after the neb. Follow-up with your PCP in 2 days. He may call your PCP tomorrow and ask about your blood pressure medicine and refills on your chronic inhalers given before you see him the following day.    Janne Napoleon, NP 08/09/15 1616  Janne Napoleon, NP 08/09/15 [phone]

## 2015-12-12 ENCOUNTER — Emergency Department: Payer: MEDICAID

## 2015-12-12 ENCOUNTER — Encounter (HOSPITAL_COMMUNITY): Payer: Self-pay

## 2015-12-12 ENCOUNTER — Emergency Department (HOSPITAL_COMMUNITY): Payer: MEDICAID

## 2015-12-12 ENCOUNTER — Emergency Department
Admission: EM | Admit: 2015-12-12 | Discharge: 2015-12-12 | Disposition: A | Discharge status: Home / Self Care | Payer: MEDICAID

## 2015-12-12 DIAGNOSIS — M542 Cervicalgia: Secondary | ICD-10-CM | POA: Insufficient documentation

## 2015-12-12 DIAGNOSIS — I517 Cardiomegaly: Secondary | ICD-10-CM

## 2015-12-12 DIAGNOSIS — R109 Unspecified abdominal pain: Secondary | ICD-10-CM | POA: Insufficient documentation

## 2015-12-12 DIAGNOSIS — M25512 Pain in left shoulder: Secondary | ICD-10-CM

## 2015-12-12 DIAGNOSIS — I4510 Unspecified right bundle-branch block: Secondary | ICD-10-CM

## 2015-12-12 DIAGNOSIS — R079 Chest pain, unspecified: Secondary | ICD-10-CM | POA: Insufficient documentation

## 2015-12-12 DIAGNOSIS — M546 Pain in thoracic spine: Secondary | ICD-10-CM | POA: Insufficient documentation

## 2015-12-12 HISTORY — DX: Gastro-esophageal reflux disease without esophagitis: K21.9

## 2015-12-12 HISTORY — DX: Unspecified asthma, uncomplicated: J45.909

## 2015-12-12 HISTORY — DX: Chronic obstructive pulmonary disease, unspecified: J44.9

## 2015-12-12 LAB — CBC WITH DIFF
BASOPHIL #: 0.2 x10ˆ3/uL (ref 0.00–0.20)
BASOPHIL %: 1 %
EOSINOPHIL #: 0.2 x10ˆ3/uL (ref 0.00–0.50)
EOSINOPHIL %: 2 %
HCT: 46.2 % (ref 34.6–46.2)
HGB: 15.5 g/dL (ref 11.8–15.8)
LYMPHOCYTE #: 4.6 x10ˆ3/uL — ABNORMAL HIGH (ref 0.90–3.40)
LYMPHOCYTE %: 37 %
MCH: 30.6 pg (ref 27.6–33.2)
MCHC: 33.6 g/dL (ref 32.6–35.4)
MCV: 90.9 fL (ref 82.3–96.7)
MONOCYTE #: 0.9 x10ˆ3/uL (ref 0.20–0.90)
MONOCYTE %: 7 %
MPV: 9.9 fL (ref 6.6–10.2)
NEUTROPHIL #: 6.5 x10ˆ3/uL — ABNORMAL HIGH (ref 1.50–6.40)
NEUTROPHIL %: 53 %
PLATELETS: 228 x10ˆ3/uL (ref 140–440)
RBC: 5.08 x10ˆ6/uL (ref 3.80–5.24)
RDW: 15.1 % (ref 12.4–15.2)
WBC: 12.3 x10ˆ3/uL — ABNORMAL HIGH (ref 3.5–10.3)

## 2015-12-12 LAB — PT/INR: INR: 0.96 (ref 0.90–1.10)

## 2015-12-12 LAB — BASIC METABOLIC PANEL
ANION GAP: 7 mmol/L
BUN/CREA RATIO: 14
BUN: 13 mg/dL (ref 8–20)
CALCIUM: 8.9 mg/dL (ref 8.9–10.3)
CHLORIDE: 103 mmol/L (ref 101–111)
CO2 TOTAL: 25 mmol/L (ref 22–32)
CREATININE: 0.9 mg/dL (ref 0.6–1.2)
ESTIMATED GFR: 70 mL/min/1.73mˆ2
GLUCOSE: 102 mg/dL (ref 70–110)
POTASSIUM: 3.8 mmol/L (ref 3.6–5.1)
SODIUM: 135 mmol/L — ABNORMAL LOW (ref 136–144)

## 2015-12-12 LAB — THYROID STIMULATING HORMONE (SENSITIVE TSH)
TSH: 1.13 u[IU]/mL (ref 0.340–5.600)
TSH: 1.13 u[IU]/mL (ref 0.340–5.600)

## 2015-12-12 LAB — URINALYSIS, MACRO/MICRO
BILIRUBIN: NEGATIVE mg/dL
BLOOD: NEGATIVE mg/dL
GLUCOSE: NEGATIVE mg/dL
KETONES: NEGATIVE mg/dL
LEUKOCYTES: NEGATIVE WBCs/uL
NITRITE: NEGATIVE
PH: 5 (ref 5.0–7.0)
PROTEIN: NEGATIVE mg/dL
SPECIFIC GRAVITY: >1.03 — ABNORMAL HIGH (ref 1.010–1.025)
UROBILINOGEN: 0.2 mg/dL

## 2015-12-12 LAB — HCG QUALITATIVE PREGNANCY, SERUM: PREGNANCY, SERUM QUALITATIVE: NEGATIVE

## 2015-12-12 LAB — TROPONIN-I: TROPONIN I: 0.02 ng/mL (ref <=0.04)

## 2015-12-12 MED ORDER — IOVERSOL 320 MG IODINE/ML INTRAVENOUS SOLUTION
120.00 mL | INTRAVENOUS | Status: AC
Start: 2015-12-12 — End: 2015-12-12
  Administered 2015-12-12: 70 mL via INTRAVENOUS

## 2015-12-12 MED ORDER — FENTANYL (PF) 50 MCG/ML INJECTION SOLUTION
50.00 ug | INTRAMUSCULAR | Status: AC
Start: 2015-12-12 — End: 2015-12-12
  Administered 2015-12-12: 50 ug via INTRAVENOUS
  Filled 2015-12-12: qty 2

## 2015-12-12 MED ORDER — SODIUM CHLORIDE 0.9 % (FLUSH) INJECTION SYRINGE
3.00 mL | INJECTION | INTRAMUSCULAR | Status: DC
Start: 2015-12-12 — End: 2015-12-12

## 2015-12-12 NOTE — ED Nurses Note (Signed)
Pt given discharge instructions. Pt voiced understanding of home care and s/s to that  would necessitate return to the ED. All questions answered. Pt denied any further needs.

## 2015-12-12 NOTE — Discharge Instructions (Signed)
Follow up with your regular doctor next week.

## 2015-12-12 NOTE — ED Provider Notes (Signed)
[facility]  [facility]: 12/12/15      Chief Complaint:  Patient presents with     Chief Complaint   Patient presents with   . Chest Pain     pt c/o's midsternal chest pain radiates into neck and left arm since thursday evening.    . Neck Pain     pt c/o's posterior neck pain, mainly on left side since thursday evening.   . Back Pain     pt c/o's upper back pain between shoulder bladers radiates into posterior head and left arm since thursday evening on/off getting worse per pt         HPI    Yvette Schmidt is a 40 y.o. female who presents to the Emergency Department via EMS for evaluation of chest, neck, back pain. Patient states symptoms started 2 nights ago.  Symptoms have been intermittent since onset.  She describes symptoms as a burning sensation with diffuse radiation.  States that the pain is primarily in the upper back between shoulder blades that radiates up in the left neck and posterior head, this pain has gotten worse since Thursday he has onset.  She also reports mid substernal chest pain with radiation into the neck and left arm that started Thursday evening.  This pain is also described as a burning sensation.  Patient also reports associated shortness of breath and right CVA tenderness. She denies any urinary symptoms. Patient states that was given aspirin and nitro by EMS which caused her right arm to go numb.  Denies any aggravating factors.      Past Medical History:  Diagnosis     Past Medical History:   Diagnosis Date   . Asthma    . COPD (chronic obstructive pulmonary disease) ([facility])    . GERD (gastroesophageal reflux disease)        Past Surgical History:  Past Surgical History:   Procedure Laterality Date   . Hx hysterectomy     . Pb colonoscopy,diagnostic         Family History: No family history on file.    Social History     Social History   Substance Use Topics   . Smoking status: Current Every Day Smoker     Packs/day: 0.50     Types: Cigarettes   . Smokeless tobacco: Never Used   . Alcohol use  Yes      Comment: seldom       Medications:  Medications Prior to Admission     None          Allergy:  Allergies   Allergen Reactions   . Beeswax      Allergic to [employer]ee Stings   . Clindamycin    . Hydrocodone    . Other      Nutrasweet, sugar subs   . Sulfa (Sulfonamides)      HIVES   . Theophylline      ANXIETY       Review of Systems  Review of Systems   Constitutional: Negative for chills and fever.   HENT: Negative for congestion and sore throat.    Eyes: Negative for discharge and visual disturbance.   Respiratory: Positive for shortness of breath. Negative for cough and wheezing.    Cardiovascular: Positive for chest pain. Negative for leg swelling.   Gastrointestinal: Negative for abdominal pain, constipation, diarrhea, nausea and vomiting.   Endocrine: Negative for polydipsia and polyuria.   Genitourinary: Negative for dysuria, hematuria, vaginal bleeding and vaginal  discharge.   Musculoskeletal: Positive for back pain. Negative for joint swelling.   Skin: Negative for pallor and rash.   Neurological: Negative for dizziness, light-headedness and headaches.   Psychiatric/Behavioral: Negative for hallucinations and suicidal ideas.   All other systems reviewed and are negative.      Vitals:  Filed Vitals:    12/12/15 0530 12/12/15 0545 12/12/15 0600 12/12/15 0632   BP: 125/72 119/79 (!) 137/102 128/78   Pulse: 74 77 77 81   Resp: 15 16 16 16    Temp:       SpO2: 94% 94% 95% 97%       PHYSICAL EXAM:    Vital signs reviewed.  Nursing note reviewed.  Constitutional: Patient is awake, alert, no acute distress and no obvious discomfort.  Overweight  Head: Normocephalic and atraumatic.   Eyes: Conjunctivae are normal. Pupils are equal, round, and reactive to light.   Neck: Normal range of motion. Neck supple. No meningeal signs. No carotid bruits. No JVD.  Mouth: moist mucous membranes  Cardiovascular: Normal rate, regular rhythm. (+)S1/S2,  No murmurs, gallops or rubs  Pulmonary/Chest: Clear to auscultate bilaterally,  no wheezing rales or rhonchi.  Abdominal: Soft. Bowel sounds are normal. no distension. No tenderness, rebound, or guarding  Upper Extremity: normal to inspection, (+) radial pulses  Lower Extremity: normal to inspection, no clubbing, cyanosis or edema  Neurological: Patient is alert and oriented to person, place, and time. No focal motor or sensory deficits. Gait is normal.   Skin: Skin is warm and dry.No rash  Psychiatric: Patient has a normal mood and affect.       Diagnostics:    Labs:    Results for orders placed or performed during the hospital encounter of 12/12/15   BASIC METABOLIC PANEL   Result Value Ref Range    SODIUM 135 (L) 136 - 144 mmol/L    POTASSIUM 3.8 3.6 - 5.1 mmol/L    CHLORIDE 103 101 - 111 mmol/L    CO2 TOTAL 25 22 - 32 mmol/L    ANION GAP 7 mmol/L    CALCIUM 8.9 8.9 - 10.3 mg/dL    GLUCOSE 829102 70 - 562110 mg/dL    BUN 13 1-308-20 mg/dL mg/dL    CREATININE 8.650.90 7.8-4.60.6-1.2 mg/dL mg/dL    BUN/CREA RATIO 14     ESTIMATED GFR 70 Avg: 107 mL/min/1.2532m^2   TROPONIN-I   Result Value Ref Range    TROPONIN I 0.02 <=0.04 ng/mL   PT/INR   Result Value Ref Range    INR 0.96 0.90 - 1.10   CBC WITH DIFF   Result Value Ref Range    WBC 12.3 (H) 3.5 - 10.3 x10^3/uL    RBC 5.08 3.80 - 5.24 x10^6/uL    HGB 15.5 11.8 - 15.8 g/dL    HCT 96.246.2 95.234.6 - 84.146.2 %    MCV 90.9 82.3 - 96.7 fL    MCH 30.6 27.6 - 33.2 pg    MCHC 33.6 32.6 - 35.4 g/dL    RDW 32.415.1 40.112.4 - 02.715.2 %    PLATELETS 228 140 - 440 x10^3/uL    MPV 9.9 6.6 - 10.2 fL    NEUTROPHIL % 53 %    LYMPHOCYTE % 37 %    MONOCYTE % 7 %    EOSINOPHIL % 2 %    BASOPHIL % 1 %    NEUTROPHIL # 6.50 (H) 1.50 - 6.40 x10^3/uL    LYMPHOCYTE # 4.60 (H) 0.90 - 3.40 x10^3/uL  MONOCYTE # 0.90 0.20 - 0.90 x10^3/uL    EOSINOPHIL # 0.20 0.00 - 0.50 x10^3/uL    BASOPHIL # 0.20 0.00 - 0.20 x10^3/uL   HCG QUALITATIVE PREGNANCY, SERUM   Result Value Ref Range    PREGNANCY, SERUM QUALITATIVE Negative Negative   ECG 12 LEAD - ED USE   Result Value Ref Range    Heart Rate 75 BPM    PR Interval  147 ms    QRS Duration 112 ms    QT Interval 413 ms    QTC Calculation 462 ms    Calculated P Axis 41 deg    QRS Axis -29 deg    Calculated T Axis 21 deg    I 40 Axis 96 deg    T 40 Axis -56 deg    ST Axis 43 deg    EKG Severity - ABNORMAL ECG -      Labs reviewed and interpreted by me.    Radiology:    Results for orders placed or performed during the hospital encounter of 12/12/15   XR CHEST PA AND LATERAL     Status: None    Narrative    MRS. Tarahji D Okray    PROCEDURE DESCRIPTION: XR CHEST PA AND LATERAL    PROCEDURE PERFORMED DATE AND TIME: 12/12/2015 4:56 AM    CLINICAL INDICATION: chest pain    TECHNIQUE: 2 views / 2 images submitted.    COMPARISON: 10/09/2005    FINDINGS: The heart size is normal. No focal consolidations. No pulmonary  edema, pleural effusions, or pneumothorax.       Impression    No acute process.     CT CHEST FOR PULMONARY EMBOLUS W IV CONTRAST     Status: None    Narrative    MRS. Elizabella D Antonetti    PROCEDURE DESCRIPTION: CT CHEST FOR PULMONARY EMBOLUS W IV CONTRAST    PROCEDURE PERFORMED DATE AND TIME: 12/12/2015 6:18 AM     CT DOSE INFORMATION:  DLP (mGycm):  810    INTRAVENOUS CONTRAST:125 of  [employer]ir[employer] 320     CLINICAL INDICATION: chest pain, sob    COMPARISON: CT abdomen and pelvis 10/09/2005      FINDINGS: No evidence for a pulmonary embolus. No thoracic adenopathy. No  lung consolidations. No pleural effusions or pneumothorax.      Impression    No evidence for a pulmonary embolus or other acute process in  the chest.            The CT exam was performed using one or more the following a dose reduction  techniques: Automated exposure control, adjustment of the mA and/or kV  according to the patient's size, or use of iterative reconstruction  technique.         EKG:  12 lead EKG interpreted by me.   Normal sinus rhythm. 75 beats per minute. Left axis deviation.  Poor R-wave progression.  Incomplete right bundle branch block.  Left ventricular hypertrophy.  Nonspecific ST T wave  changes.  No acute ischemic change.  No changes compared to the 10/09/2005    ED Course/Medical Decision Making:  Care of patient signed out to Dr. Noreene Filbert. Awaiting urine results. Other results have been discussed with patient.       Virgel Manifold, DO

## 2015-12-12 NOTE — ED Attending Handoff Note (Signed)
12/12/2015 07:10  Accepted care from prior attending at shift change.  Plan for evaluation and management unchanged.     Chest pain, Left shoulder and neck pain, and Right CVAT.  Ct chest negative for PE.  Awaiting UA.  Anticipate discharge.    ED Course   Yisroel RammingSchmidt, Shahla Betsill Estelle's Documentation   Value Comment Time   LEUKOCYTES: Negative (Reviewed) 11/18 0744   BLOOD: Negative Will discharge as preciously planned by Dr. Rosalita ChessmanSpratt. 11/18 0744       Patient rested comfortably in the ED while in my care.    Disposition: Discharged

## 2015-12-15 LAB — ECG 12 LEAD - ED USE
Calculated P Axis: 41 deg
Calculated T Axis: 21 deg
EKG Severity: ABNORMAL
Heart Rate: 75 {beats}/min
I 40 Axis: 96 deg
PR Interval: 147 ms
QRS Axis: -29 deg
QT Interval: 413 ms
QTC Calculation: 462 ms
ST Axis: 43 deg
T 40 Axis: -56 deg

## 2016-03-17 DIAGNOSIS — R0789 Other chest pain: Secondary | ICD-10-CM

## 2016-03-17 DIAGNOSIS — R0602 Shortness of breath: Secondary | ICD-10-CM
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
## 2016-04-06 DIAGNOSIS — Z9289 Personal history of other medical treatment: Secondary | ICD-10-CM | POA: Insufficient documentation

## 2016-04-07 ENCOUNTER — Other Ambulatory Visit (INDEPENDENT_AMBULATORY_CARE_PROVIDER_SITE_OTHER): Payer: Self-pay | Admitting: Cardiovascular Disease

## 2016-04-13 ENCOUNTER — Other Ambulatory Visit (INDEPENDENT_AMBULATORY_CARE_PROVIDER_SITE_OTHER): Payer: Self-pay | Admitting: Internal Medicine

## 2016-05-26 ENCOUNTER — Emergency Department (EMERGENCY_DEPARTMENT_HOSPITAL): Payer: MEDICAID

## 2016-05-26 ENCOUNTER — Emergency Department
Admission: EM | Admit: 2016-05-26 | Discharge: 2016-05-26 | Disposition: A | Discharge status: Home / Self Care | Payer: MEDICAID | Attending: Emergency Medicine | Admitting: Emergency Medicine

## 2016-05-26 ENCOUNTER — Emergency Department (HOSPITAL_COMMUNITY): Payer: MEDICAID

## 2016-05-26 ENCOUNTER — Encounter (HOSPITAL_COMMUNITY): Payer: Self-pay

## 2016-05-26 DIAGNOSIS — R519 Headache, unspecified: Secondary | ICD-10-CM

## 2016-05-26 DIAGNOSIS — R0789 Other chest pain: Secondary | ICD-10-CM | POA: Insufficient documentation

## 2016-05-26 DIAGNOSIS — Z7951 Long term (current) use of inhaled steroids: Secondary | ICD-10-CM | POA: Insufficient documentation

## 2016-05-26 DIAGNOSIS — R569 Unspecified convulsions: Secondary | ICD-10-CM | POA: Insufficient documentation

## 2016-05-26 DIAGNOSIS — R51 Headache: Secondary | ICD-10-CM

## 2016-05-26 DIAGNOSIS — R079 Chest pain, unspecified: Secondary | ICD-10-CM

## 2016-05-26 DIAGNOSIS — F329 Major depressive disorder, single episode, unspecified: Secondary | ICD-10-CM | POA: Insufficient documentation

## 2016-05-26 DIAGNOSIS — I444 Left anterior fascicular block: Secondary | ICD-10-CM

## 2016-05-26 DIAGNOSIS — I10 Essential (primary) hypertension: Secondary | ICD-10-CM | POA: Insufficient documentation

## 2016-05-26 DIAGNOSIS — J449 Chronic obstructive pulmonary disease, unspecified: Secondary | ICD-10-CM | POA: Insufficient documentation

## 2016-05-26 DIAGNOSIS — F17210 Nicotine dependence, cigarettes, uncomplicated: Secondary | ICD-10-CM | POA: Insufficient documentation

## 2016-05-26 LAB — BASIC METABOLIC PANEL
ANION GAP: 12 mmol/L (ref 4–13)
BUN/CREA RATIO: 13 (ref 6–22)
BUN: 10 mg/dL (ref 8–25)
CALCIUM: 8.9 mg/dL (ref 8.5–10.2)
CHLORIDE: 107 mmol/L (ref 96–111)
CO2 TOTAL: 20 mmol/L — ABNORMAL LOW (ref 22–32)
CREATININE: 0.76 mg/dL (ref 0.49–1.10)
ESTIMATED GFR: >59 mL/min/1.73mˆ2 (ref >59)
GLUCOSE: 140 mg/dL — ABNORMAL HIGH (ref 65–139)
GLUCOSE: 140 mg/dL — ABNORMAL HIGH (ref 65–139)
POTASSIUM: 3.8 mmol/L (ref 3.5–5.1)
SODIUM: 139 mmol/L (ref 136–145)

## 2016-05-26 LAB — CBC WITH DIFF
BASOPHIL #: 0.06 x10ˆ3/uL (ref 0.00–0.20)
BASOPHIL %: 1 %
EOSINOPHIL #: 0.17 x10ˆ3/uL (ref 0.00–0.50)
EOSINOPHIL %: 2 %
HCT: 43.2 % (ref 33.5–45.2)
HGB: 14.9 g/dL (ref 11.2–15.2)
LYMPHOCYTE #: 2.86 x10ˆ3/uL (ref 1.00–4.80)
LYMPHOCYTE %: 35 %
MCH: 32 pg (ref 27.4–33.0)
MCHC: 34.6 g/dL (ref 32.5–35.8)
MCV: 92.5 fL (ref 78.0–100.0)
MONOCYTE #: 0.56 x10ˆ3/uL (ref 0.30–1.00)
MONOCYTE %: 7 %
MPV: 8.6 fL (ref 7.5–11.5)
NEUTROPHIL #: 4.52 x10ˆ3/uL (ref 1.50–7.70)
NEUTROPHIL %: 55 %
NEUTROPHIL %: 55 %
PLATELETS: 215 x10ˆ3/uL (ref 140–450)
RBC: 4.67 x10ˆ6/uL (ref 3.63–4.92)
RDW: 15.8 % — ABNORMAL HIGH (ref 12.0–15.0)
WBC: 8.2 x10ˆ3/uL (ref 3.5–11.0)

## 2016-05-26 LAB — URINALYSIS, MICROSCOPIC
RBCS: 0 /HPF (ref <6.0)
WBCS: 1 /HPF (ref <11.0)

## 2016-05-26 LAB — TROPONIN-I (FOR ED ONLY): TROPONIN I: <7 ng/L (ref 0–30)

## 2016-05-26 LAB — URINALYSIS, MACROSCOPIC
BILIRUBIN: NEGATIVE mg/dL
BLOOD: NEGATIVE mg/dL
COLOR: NORMAL
GLUCOSE: NEGATIVE mg/dL
KETONES: NEGATIVE mg/dL
LEUKOCYTES: NEGATIVE WBCs/uL
NITRITE: NEGATIVE
PH: 6 (ref 5.0–8.0)
PROTEIN: NEGATIVE mg/dL
SPECIFIC GRAVITY: 1.019 (ref 1.005–1.030)
SPECIFIC GRAVITY: 1.019 (ref 1.005–1.030)
UROBILINOGEN: 2 mg/dL — AB

## 2016-05-26 LAB — HEPATIC FUNCTION PANEL
ALBUMIN: 3.2 g/dL — ABNORMAL LOW (ref 3.5–5.0)
ALKALINE PHOSPHATASE: 85 U/L (ref <150)
ALT (SGPT): 12 U/L (ref <55)
AST (SGOT): 15 U/L (ref 8–41)
BILIRUBIN DIRECT: 0.1 mg/dL (ref <0.3)
BILIRUBIN TOTAL: 0.3 mg/dL (ref 0.3–1.3)
PROTEIN TOTAL: 6.9 g/dL (ref 6.4–8.3)

## 2016-05-26 LAB — PT/INR
INR: 0.93 (ref 0.80–1.20)
PROTHROMBIN TIME: 10.8 s (ref 9.3–13.9)
PROTHROMBIN TIME: 10.8 seconds (ref 9.3–13.9)

## 2016-05-26 LAB — PTT (PARTIAL THROMBOPLASTIN TIME): APTT: 48.8 s — ABNORMAL HIGH (ref 25.1–36.5)

## 2016-05-26 LAB — LIPASE: LIPASE: 11 U/L (ref 10–80)

## 2016-05-26 LAB — HCG, SERUM QUALITATIVE, PREGNANCY: PREGNANCY, SERUM QUALITATIVE: NEGATIVE

## 2016-05-26 MED ORDER — SODIUM CHLORIDE 0.9 % IV BOLUS
1000.00 mL | INJECTION | Status: AC
Start: 2016-05-26 — End: 2016-05-26
  Administered 2016-05-26: 1000 mL via INTRAVENOUS
  Administered 2016-05-26: 0 mL via INTRAVENOUS

## 2016-05-26 MED ORDER — HALOPERIDOL LACTATE 5 MG/ML INJECTION SOLUTION
5.00 mg | INTRAMUSCULAR | Status: AC
Start: 2016-05-26 — End: 2016-05-26
  Administered 2016-05-26: 5 mg via INTRAVENOUS
  Filled 2016-05-26: qty 1

## 2016-05-26 MED ORDER — SODIUM CHLORIDE 0.9 % (FLUSH) INJECTION SYRINGE
2.00 mL | INJECTION | INTRAMUSCULAR | Status: DC | PRN
Start: 2016-05-26 — End: 2016-05-26

## 2016-05-26 MED ORDER — SODIUM CHLORIDE 0.9 % (FLUSH) INJECTION SYRINGE
2.00 mL | INJECTION | Freq: Three times a day (TID) | INTRAMUSCULAR | Status: DC
Start: 2016-05-26 — End: 2016-05-26

## 2016-05-26 NOTE — ED Nurses Note (Signed)
Pt states two night ago pt had a seizure and at 0415 today pt having jerking arm movements and CP. Vision change and yellow drainage in right eye. LUE tremors stop when pt is focused on conversation.

## 2016-05-26 NOTE — ED Nurses Note (Signed)
Pt to CT

## 2016-05-26 NOTE — ED Nurses Note (Signed)
Pt given dc instructions and no scripts. Pt informed of f/u apt. Pt verbalized understanding. Iv pulled.

## 2016-05-26 NOTE — Discharge Instructions (Signed)
Resume home diet, as tolerated. Avoid alcohol.  Gradually increase activity, as tolerated.  Follow-up with PCP as needed.  Patient/patient's family was advised to return to the ED with any new, concerning or worsening symptoms and follow up as directed. The patient/patient's family verbalized understanding of all instructions, given the opportunity to ask questions, and had no further questions or concerns.     Thank you for allowing me to take part in your care.

## 2016-05-26 NOTE — ED Provider Notes (Signed)
[facility]  Emergency Department  Provider Note    Name: Yvette Schmidt  Age and Gender: 41 y.o. female  Date of Birth: 03-19-1975  Date of Service: 05/26/2016  MRN: [id number]  PCP: Lenice Llamas, DO  Attending: Minardi    Chief Complaint   Patient presents with   . Headache     developed tremors and stuttering speech, witnessed by husband. h/o seizures, last seizure was 2 nights ago. reports chest pressure radiating into neck and head with some vision changes.    . Seizure   . Chest Pain       Clinical Impression:     Encounter Diagnosis   Name Primary?   . Headache Yes       MDM/Course:  TACOYA ALTIZER is a 41 y.o. female who presented with headache, stuttering speech and previous seizure. She was given Haldol prior to a CT head which helped control her symptoms of chest pain and headache. She remained vitally stable while in the ED. Her CT head scan was negative for abnormalities. She was deemed suitable for discharge home.       ED Course       Medications given:  Medications   NS flush syringe (not administered)     And   NS flush syringe (not administered)   haloperidol (HALDOL) 5 mg/mL injection (5 mg Intravenous Given 05/26/16 0847)   NS bolus infusion 1,000 mL (0 mL Intravenous Stopped 05/26/16 0920)       Following the below history, physical exam, and studies, the patient was deemed stable and suitable for discharge. The patient was advised to return to the ED for any new or worsening symptoms. Discharge and follow-up instructions were discussed with the patient in detail, who verbalizes understanding. The patient is in agreement and is comfortable with the plan of care.       Disposition: Discharged    New Prescriptions    No medications on file       Follow up:    Lenice Llamas, DO  [address]  [place] [address]  [phone]      As needed, If symptoms worsen    Patient states that she follows with a neurologist and plans to make an appointment to be seen by him.   -------------------------------------------------------------------------------------------------------------------------------------------    Arrival: The patient arrived by ambulance and is accompanied by patient and spouse.  History Obtained by: provider  History Limitations: none    Chief Complaint   Patient presents with   . Headache     developed tremors and stuttering speech, witnessed by husband. h/o seizures, last seizure was 2 nights ago. reports chest pressure radiating into neck and head with some vision changes.    . Seizure   . Chest Pain       HPI:  Yvette Schmidt is a 41 y.o. White female presenting with headache, tremors, stuttering speech and vision changes. Patient reports that her symptoms began 2 nights ago when she had a seizure that was witnessed by her husband. She has a history of seizures since 2015 and has 1-2 seizures per month on average. Since her symptoms began, she describes a stabbing left sided chest pain that radiates up towards her jaw. She also complains of a headache with vision changes, including yellow stars in her L eye. She states her speech has been intermittently stuttered since 2 nights ago. The patient also reports she has a hiatal hernia which has recently "  moved" and is causing her some R sided abdominal discomfort. Patient has tried taking Ibuprofen and Neurontin to relieve her symptoms; however patient reports minimal relief.     ROS:  Constitutional: No fever, chills  Skin: No rash or diaphoresis  HENT: + headaches  Eyes: + vision changes  Cardio: + chest pain and leg swelling   Respiratory: No cough, wheezing  GI:  No nausea, vomiting  GU:  No dysuria, or increased frequency  MSK: No muscle aches, joint pain  Neuro: No numbness or tingling, + seizures  Psychiatric: No substance abuse, + depression  All other systems reviewed and are negative.      Below pertinent information reviewed with patient:  Past Medical History:   Diagnosis Date   . Asthma    . COPD  (chronic obstructive pulmonary disease) (HCC)    . GERD (gastroesophageal reflux disease)    - Seizures  - HTN  - Hypoglycemia  - Major Depressive Disorder     Medications Prior to Admission     Prescriptions    albuterol sulfate (PROVENTIL OR VENTOLIN OR PROAIR) 90 mcg/actuation Inhalation HFA Aerosol Inhaler    Take 1-2 Puffs by inhalation Every 6 hours as needed    busPIRone (BUSPAR) 15 mg Oral Tablet    Take 15 mg by mouth Three times a day    Cholecalciferol, Vitamin D3, (VITAMIN D-3) 5,000 unit Oral Tablet    Take 1 Tab by mouth Twice daily    cloNIDine HCl (CATAPRES) 0.1 mg Oral Tablet    Take 0.1 mg by mouth Twice daily    EPINEPHrine (EPIPEN) 0.3 mg/0.3 mL Injection Auto-Injector    0.3 mg by Intramuscular route Once, as needed    fluticasone (FLONASE) 50 mcg/actuation Nasal Spray, Suspension    2 Sprays by Each Nostril route Twice daily    gabapentin (NEURONTIN) 300 mg Oral Capsule    Take 600 mg by mouth Three times a day    hydrOXYzine pamoate (VISTARIL) 50 mg Oral Capsule    Take 50 mg by mouth Three times a day as needed for Itching    meloxicam (MOBIC) 15 mg Oral Tablet    Take 15 mg by mouth Once a day    OXcarbazepine (TRILEPTAL) 600 mg Oral Tablet    Take 600 mg by mouth Twice daily    prazosin (MINIPRESS) 5 mg Oral Capsule    Take 5 mg by mouth Once a day    SUMAtriptan (IMITREX) 50 mg Oral Tablet    Take 50 mg by mouth Once, as needed for Migraine May repeat in 2 hours in needed    topiramate (TOPAMAX) 50 mg Oral Tablet    Take 50 mg by mouth Twice daily    venlafaxine (EFFEXOR XR) 150 mg Oral Capsule, Sust. Release 24 hr    Take 150 mg by mouth Once a day          Allergies   Allergen Reactions   . Beeswax      Allergic to [employer]   . Clindamycin    . Hydrocodone    . Other      Nutrasweet, sugar subs   . Sulfa (Sulfonamides)      HIVES   . Theophylline      ANXIETY       Past Surgical History:   Procedure Laterality Date   . HX HERNIA REPAIR     . PB COLONOSCOPY,DIAGNOSTIC         Family  Medical History     None          Social History     Social History   . Marital status: Divorced     Spouse name: N/A   . Number of children: N/A   . Years of education: N/A     Social History Main Topics   . Smoking status: Current Every Day Smoker     Packs/day: 0.50     Years: 15.00     Types: Cigarettes   . Smokeless tobacco: Never Used   . Alcohol use Yes      Comment: seldom   . Drug use: Yes     Special: Marijuana      Comment: occasionally   . Sexual activity: Not on file     Other Topics Concern   . Not on file     Social History Narrative         Objective:  ED Triage Vitals   Enc Vitals Group      BP --       Pulse --       Resp --       Temp --       Temp src --       SpO2 --       Weight --       Height --       Head Cir --       Peak Flow --       Pain Score --       Pain Loc --       Pain Edu? --       Excl. in GC? --        Nursing notes and vital signs reviewed.    Constitutional: Pleasant 41 y.o. female appears stated age in fair health, in minimal distress, normal color, no cyanosis.   HENT:   Head: Normocephalic and atraumatic.    Eyes: EOMI  Neck: Trachea midline. Neck supple.  Cardiovascular: RRR, No murmurs, rubs or gallops. Intact distal pulses.  Pulmonary/Chest: BS equal bilaterally. No respiratory distress. Expiratory wheezes   Abdominal: BS +. Abdomen soft, no tenderness, rebound or guarding.  Musculoskeletal: No edema or deformity.  Skin: warm and dry. No cyanosis  Psychiatric: patient appears anxious   Neurological: Patient keenly alert and responsive, CN II-XII grossly intact, moving all extremities equally and fully, able to perform finger to nose and heel to shin exercises    Labs:   Labs Reviewed   BASIC METABOLIC PANEL - Abnormal; Notable for the following:        Result Value    CO2 TOTAL 20 (*)     GLUCOSE 140 (*)     All other components within normal limits   PTT (PARTIAL THROMBOPLASTIN TIME) - Abnormal; Notable for the following:     APTT 48.8 (*)     All other components  within normal limits    Narrative:     Therapeutic range for unfractionated heparin is 60.0-100.0 seconds.   CBC WITH DIFF - Abnormal; Notable for the following:     RDW 15.8 (*)     All other components within normal limits   HEPATIC FUNCTION PANEL - Abnormal; Notable for the following:     ALBUMIN 3.2 (*)     All other components within normal limits   URINALYSIS, MACROSCOPIC - Abnormal; Notable for the following:     UROBILINOGEN 2.0  (*)  APPEARANCE Cloudy (*)     All other components within normal limits    Narrative:     Test did not meet guideline to perform Urine Culture.   URINALYSIS, MICROSCOPIC - Abnormal; Notable for the following:     CALCIUM OXALATE CRYSTALS Several (*)     SQUAMOUS EPITHELIAL CELLS Few (*)     All other components within normal limits    Narrative:     Test did not meet guideline to perform Urine Culture.   PT/INR - Normal    Narrative:     Coumadin therapy INR range for Conventional Anticoagulation is 2.0 to 3.0 and for Intensive Anticoagulation 2.5 to 3.5.   TROPONIN-I (FOR ED ONLY) - Normal   HCG, SERUM QUALITATIVE, PREGNANCY - Normal    Narrative:     Sensitivity of the qualitative pregnancy test is 20 mIU hCG/mL.  If pregnancy is strongly considered, perform quantitative hCG or repeat in 48 hours.   LIPASE - Normal   CBC/DIFF    Narrative:     The following orders were created for panel order CBC/DIFF.  Procedure                               Abnormality         Status                     ---------                               -----------         ------                     CBC WITH ZOXW[[id number]]                Abnormal            Final result                 Please view results for these tests on the individual orders.   URINALYSIS, MACROSCOPIC AND MICROSCOPIC W/CULTURE REFLEX    Narrative:     The following orders were created for panel order URINALYSIS, MACROSCOPIC AND MICROSCOPIC W/CULTURE REFLEX.  Procedure                               Abnormality         Status                      ---------                               -----------         ------                     URINALYSIS, MACROSCOPIC[[id number]]      Abnormal            Final result               URINALYSIS, MICROSCOPIC[[id number]]      Abnormal            Final result                 Please view results for  these tests on the individual orders.       Imaging:  CT BRAIN WO IV CONTRAST   Final Result   Unremarkable unenhanced CT of the brain         XR AP MOBILE CHEST   Final Result   Shallow inspiration. No acute thoracic abnormality.           Patient seen by and discussed with attending physician, Dr. Karie FetchMinardi.    Parts of this patients chart were completed in a retrospective fashion due to simultaneous direct patient care activities in the Emergency Department.       Johnn HaiJulie E. Anirudh Baiz, MD  Resident, PGY-1  Department of Orthopaedics  Pager [phone]  05/26/2016 11:01

## 2016-05-26 NOTE — ED Attending Note (Signed)
I saw and examined the patient.  I directly supervised the patient's care.  I discussed the patient's care with the resident/midlevel and I agree with the findings, diagnosis, and plan as documented in the primary note.  Any exceptions, additions, or corrections are noted below.    MDM - re-evaluated pt, reviewed old records, reviewed labs, reviewed imaging, IV therapy was given, reviewed and interpreted ECGs, hemo monitoring    2 days of stuttering speech, some headache, chest pain intermittently  Main complaint now is headache  Sx resolved  Workup neg  OK for DC, ret and f/u inst

## 2016-05-27 LAB — ECG 12-LEAD
Atrial Rate: 87 {beats}/min
Atrial Rate: 87 {beats}/min
Calculated P Axis: 48 degrees
Calculated R Axis: -47 degrees
Calculated T Axis: 52 degrees
PR Interval: 156 ms
QRS Duration: 100 ms
QT Interval: 384 ms
QTC Calculation: 462 ms
Ventricular rate: 87 {beats}/min

## 2016-08-28 IMAGING — CR DG CHEST 2V
2 series · 2 of 2 positions shown · non-contrast
Comparison: 05/22/2013

CLINICAL DATA: Initial evaluation for depression, history of
hypertension COPD and smoking

EXAM:
CHEST  2 VIEW

[w chest pa]
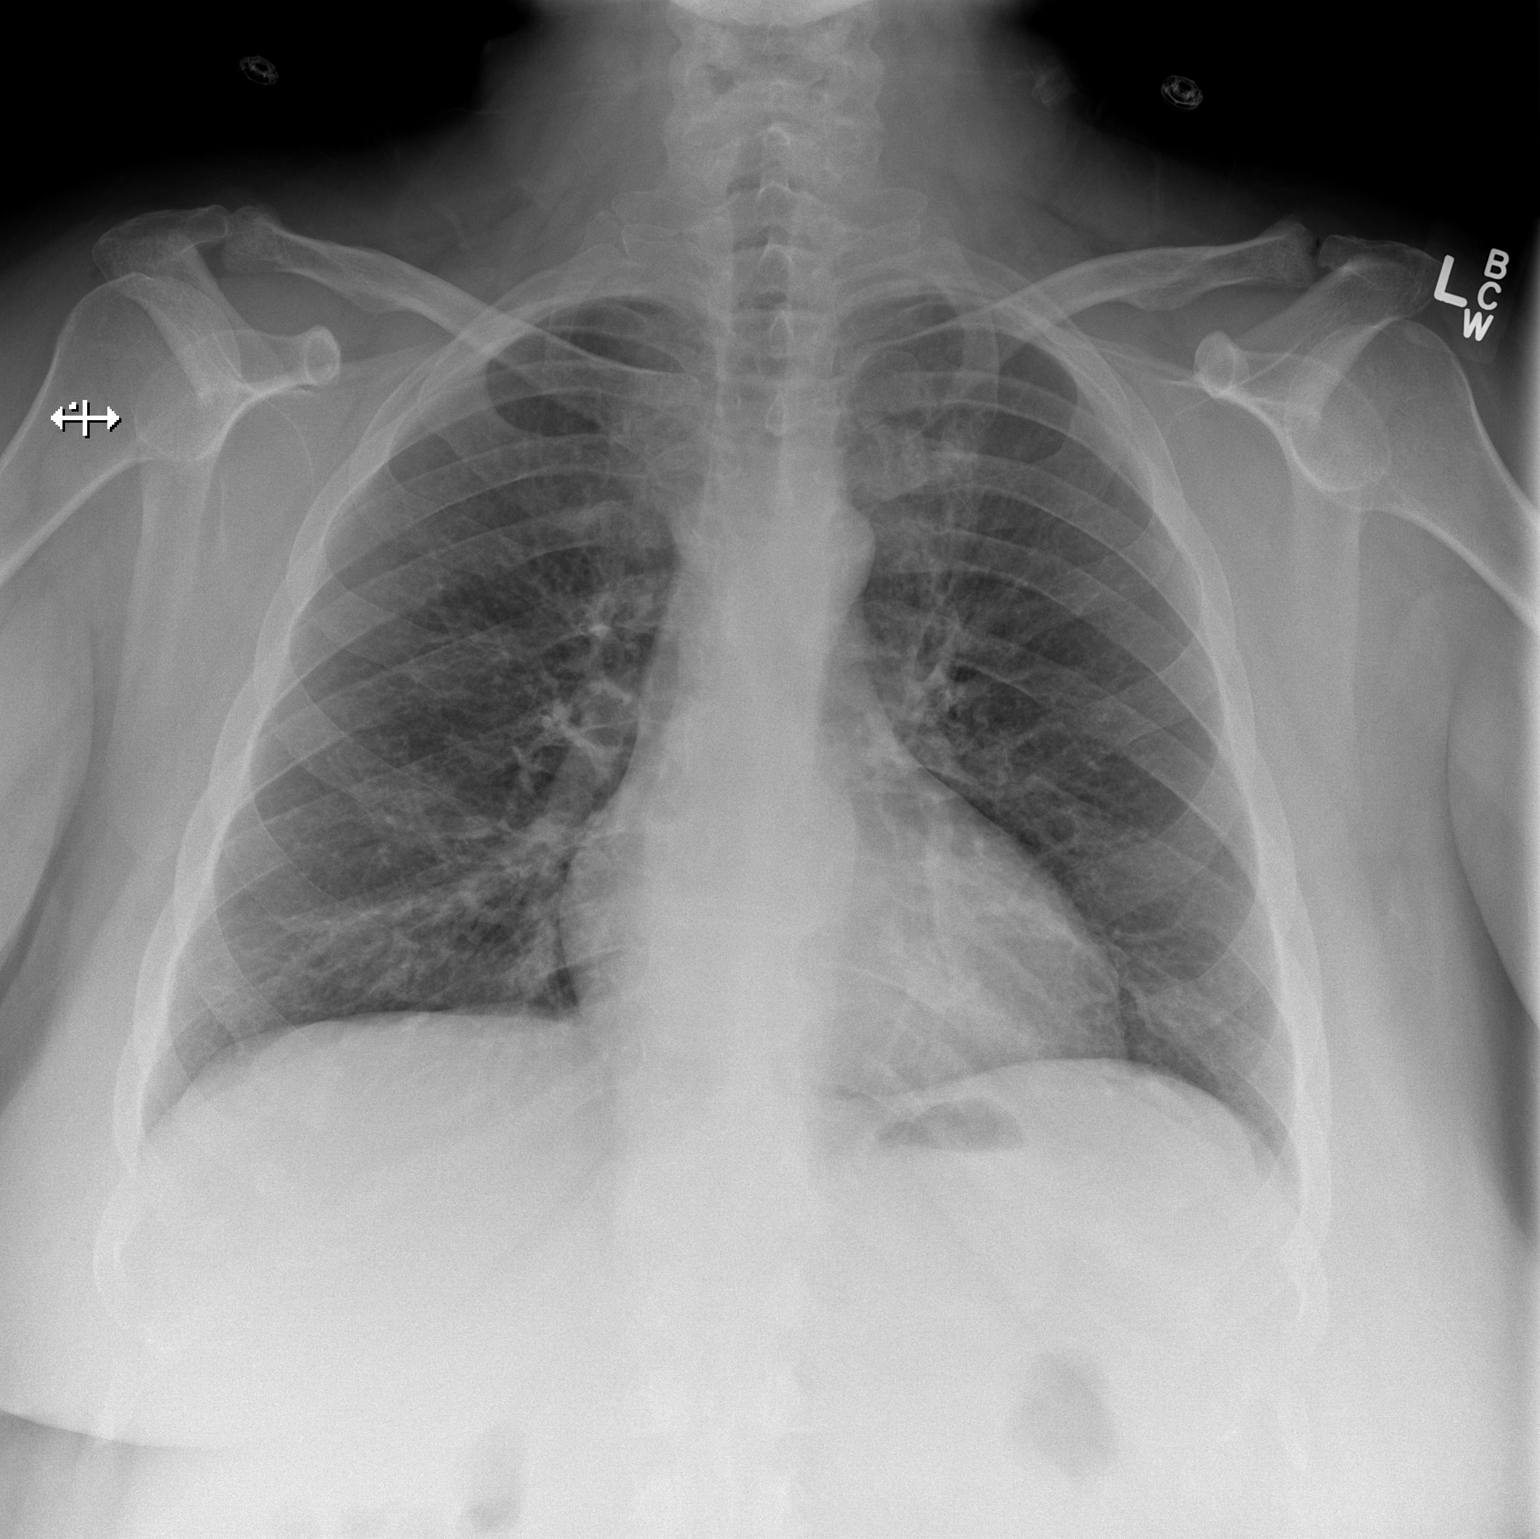

[w chest lat]
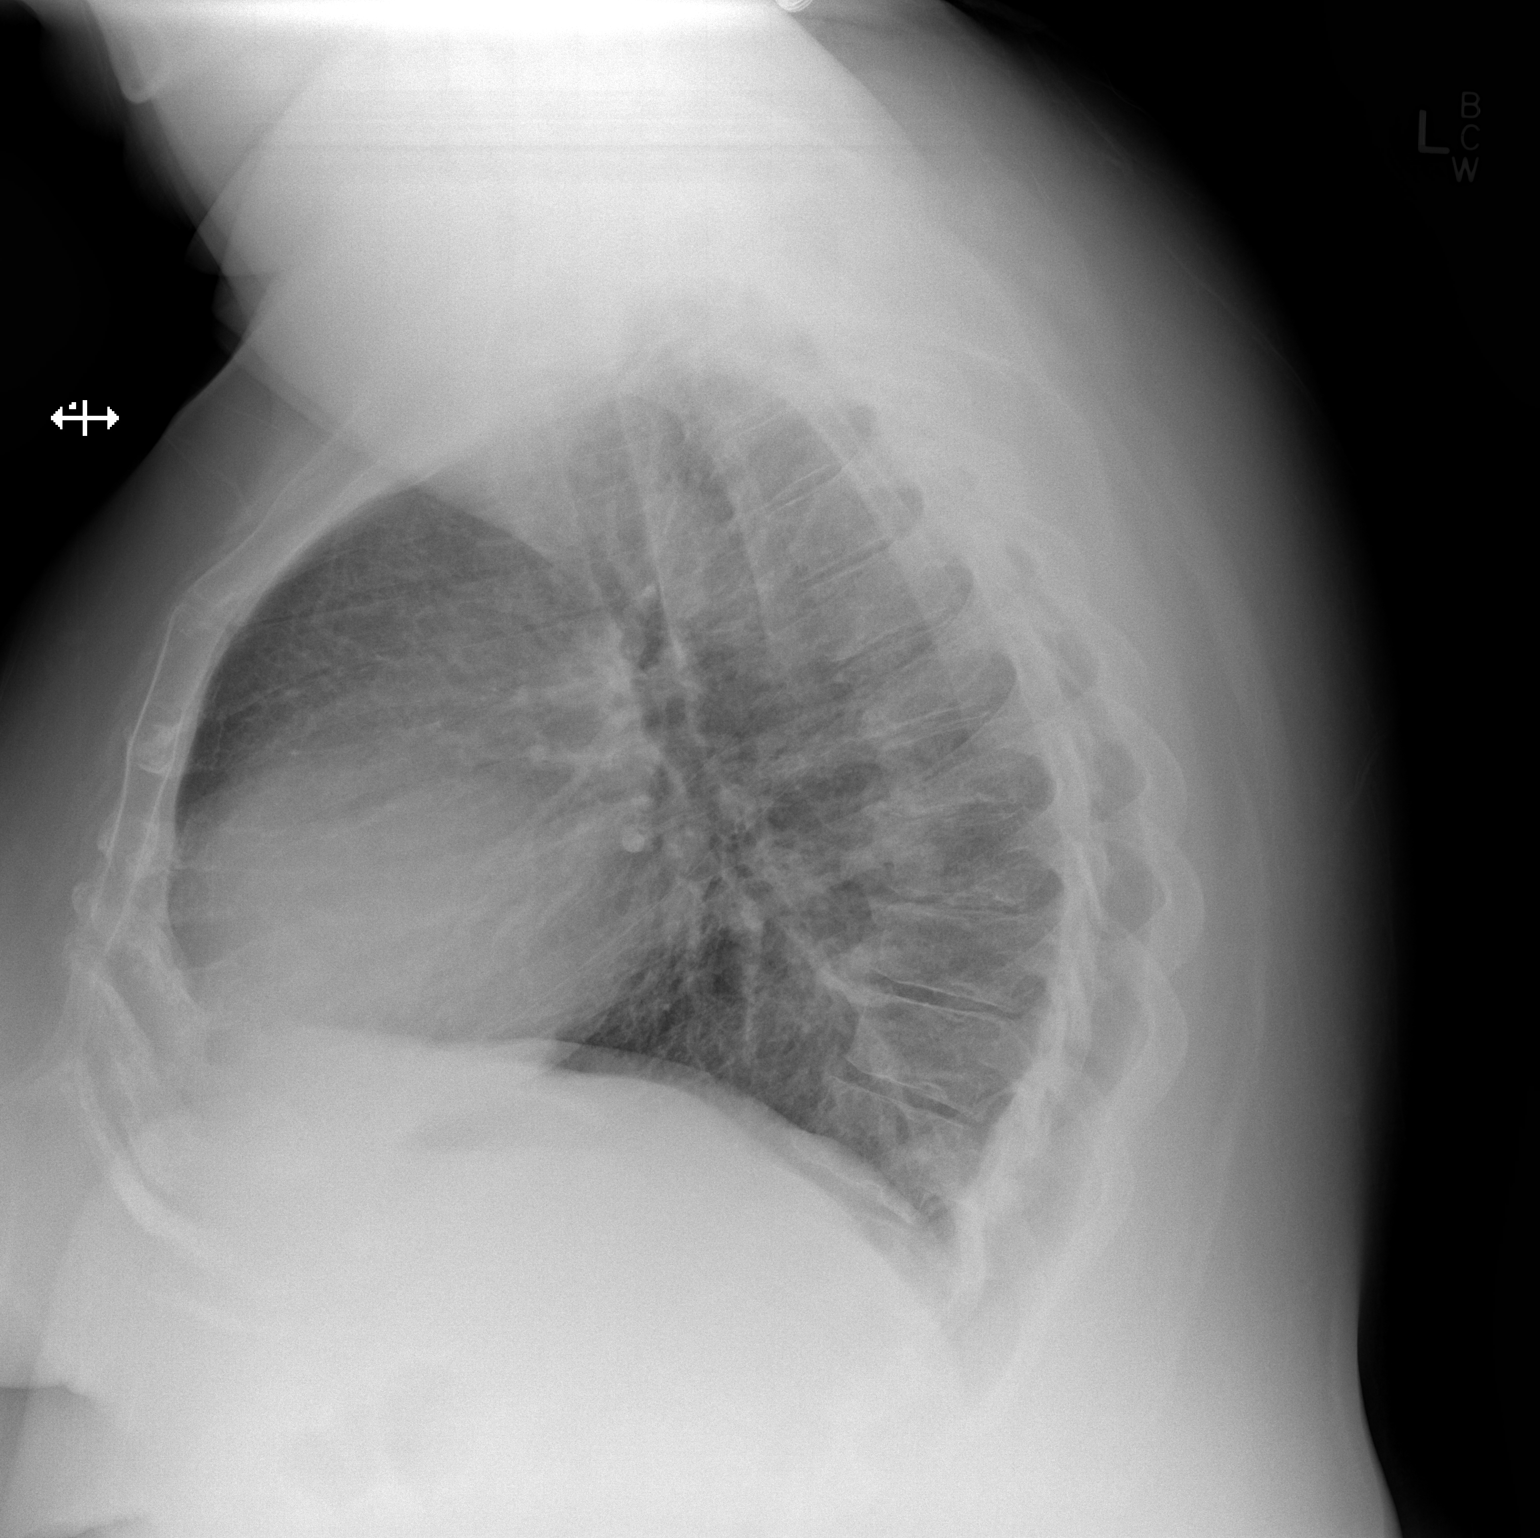

[2 of 2 positions shown; findings below may reference images not displayed]

FINDINGS: The heart size and mediastinal contours are within normal limits.
Both lungs are clear. The visualized skeletal structures are
unremarkable.
IMPRESSION: No active cardiopulmonary disease.

## 2016-10-07 ENCOUNTER — Encounter (INDEPENDENT_AMBULATORY_CARE_PROVIDER_SITE_OTHER): Payer: Self-pay | Admitting: Neurology

## 2017-05-02 DIAGNOSIS — Z903 Acquired absence of stomach [part of]: Secondary | ICD-10-CM | POA: Insufficient documentation

## 2017-05-12 HISTORY — PX: HX LAP CHOLECYSTECTOMY: SHX56

## 2021-03-25 ENCOUNTER — Encounter (INDEPENDENT_AMBULATORY_CARE_PROVIDER_SITE_OTHER): Payer: Self-pay

## 2021-03-25 ENCOUNTER — Other Ambulatory Visit: Payer: Self-pay

## 2021-03-25 ENCOUNTER — Other Ambulatory Visit (INDEPENDENT_AMBULATORY_CARE_PROVIDER_SITE_OTHER): Payer: Self-pay | Admitting: Internal Medicine

## 2021-03-25 ENCOUNTER — Telehealth (INDEPENDENT_AMBULATORY_CARE_PROVIDER_SITE_OTHER): Payer: MEDICAID

## 2021-03-25 ENCOUNTER — Other Ambulatory Visit: Payer: MEDICAID | Attending: Internal Medicine | Admitting: Internal Medicine

## 2021-03-25 VITALS — BP 118/82 | HR 118 | Temp 97.5°F | Ht 65.0 in | Wt 310.0 lb

## 2021-03-25 DIAGNOSIS — L0291 Cutaneous abscess, unspecified: Secondary | ICD-10-CM

## 2021-03-25 DIAGNOSIS — E119 Type 2 diabetes mellitus without complications: Secondary | ICD-10-CM

## 2021-03-25 LAB — CBC
HCT: 52.4 % — ABNORMAL HIGH (ref 34.8–46.0)
HGB: 17.4 g/dL — ABNORMAL HIGH (ref 11.5–16.0)
MCH: 31.5 pg (ref 26.0–32.0)
MCHC: 33.2 g/dL (ref 31.0–35.5)
MCV: 94.8 fL (ref 78.0–100.0)
MPV: 11.5 fL (ref 8.7–12.5)
PLATELETS: 268 10*3/uL (ref 150–400)
RBC: 5.53 10*6/uL — ABNORMAL HIGH (ref 3.85–5.22)
RDW-CV: 14.2 % (ref 11.5–15.5)
WBC: 15 10*3/uL — ABNORMAL HIGH (ref 3.7–11.0)

## 2021-03-25 LAB — STERILE SITE CULTURE AND GRAM STAIN, AEROBIC

## 2021-03-25 MED ORDER — ALBUTEROL SULFATE HFA 90 MCG/ACTUATION AEROSOL INHALER
1.0000 | INHALATION_SPRAY | Freq: Four times a day (QID) | RESPIRATORY_TRACT | 0 refills | Status: DC | PRN
Start: 2021-03-25 — End: 2021-07-02

## 2021-03-25 MED ORDER — FLUTICASONE PROPIONATE 50 MCG/ACTUATION NASAL SPRAY,SUSPENSION
2.00 | Freq: Two times a day (BID) | NASAL | 0 refills | Status: DC
Start: 2021-03-25 — End: 2021-07-02

## 2021-03-25 MED ORDER — DOXYCYCLINE HYCLATE 100 MG TABLET
100.00 mg | ORAL_TABLET | Freq: Two times a day (BID) | ORAL | 0 refills | Status: DC
Start: 2021-03-25 — End: 2021-05-21

## 2021-03-25 MED ORDER — LIDOCAINE HCL 10 MG/ML (1 %) INJECTION SOLUTION
1.00 g | INTRAMUSCULAR | Status: AC
Start: 2021-03-25 — End: 2021-03-25
  Administered 2021-03-25: 1 g via INTRAMUSCULAR

## 2021-03-25 MED ORDER — GABAPENTIN 300 MG CAPSULE
300.00 mg | ORAL_CAPSULE | Freq: Three times a day (TID) | ORAL | 0 refills | Status: AC
Start: 2021-03-25 — End: 2021-05-21

## 2021-03-25 MED ORDER — INSULIN ASPAR PROT-INSULIN ASPART 100 UNIT/ML (70-30) SUBCUTANEOUS PEN
10.0000 [IU] | PEN_INJECTOR | Freq: Two times a day (BID) | SUBCUTANEOUS | 2 refills | Status: DC
Start: 2021-03-25 — End: 2021-04-01

## 2021-03-25 MED ORDER — KETOROLAC 60 MG/2 ML INTRAMUSCULAR SOLUTION
60.0000 mg | Freq: Once | INTRAMUSCULAR | Status: AC
Start: 2021-03-25 — End: 2021-03-25
  Administered 2021-03-25: 60 mg via INTRAMUSCULAR

## 2021-03-25 MED ORDER — EPINEPHRINE 0.3 MG/0.3 ML INJECTION, AUTO-INJECTOR
0.3000 mg | AUTO-INJECTOR | Freq: Once | INTRAMUSCULAR | 0 refills | Status: DC | PRN
Start: 2021-03-25 — End: 2022-04-15

## 2021-03-25 NOTE — Procedures (Signed)
[facility]  [address] [place], [address]    Procedure Note    Name: Yvette Schmidt MRN:  [id number]   Date: 03/25/2021 Age: 46 y.o.       Incision & Drainage-ProcDoc    Date/Time: 03/25/2021 3:50 PM  Performed by: Leroy Kennedy, MD  Authorized by: Leroy Kennedy, MD     Consent:     Consent obtained:  Verbal    Consent given by:  Patient    Risks, benefits, and alternatives were discussed: yes      Risks discussed:  Bleeding, incomplete drainage, pain and infection    Alternatives discussed:  No treatment  Location:     Type:  Abscess    Size:  6 into 4 inches    Location:  Lower extremity    Lower extremity location:  Leg    Leg location:  R upper leg  Pre-procedure details:     Skin preparation:  Povidone-iodine  Sedation:     Sedation type:  None  Anesthesia:     Anesthesia method:  Local infiltration    Local anesthetic:  Lidocaine 2% WITH epi  Procedure type:     Complexity:  Simple  Procedure details:     Ultrasound guidance: no      Needle aspiration: no      Incision types:  Stab incision    Incision depth:  Submucosal    Drainage:  Bloody and purulent    Drainage amount:  Moderate    Wound treatment:  Wound left open    Packing materials:  1/4 in iodoform gauze  Post-procedure details:     Procedure completion:  Tolerated  Comments:      Under the aseptic conditions using 11 size scalpel and forceps, incision of about 1 cm was made to the abscess on the medial inner right thigh , about 15 cc of pus is drained, culture is sent,  area is cleaned after the drainage  And packed with Iodoform gauze, sterile dressing is applied over it  pt is given IM rocephin 1 gm ad tramadol injection for pain.  advised to FU in 3 days for the review of her symptoms          Yvette Pole, MA

## 2021-03-25 NOTE — Nursing Note (Signed)
Patient presents to establish care and discuss a sore on her right thigh. Sore spot began a week into the new year she reports hurting herself working on frozen waterlines. The spot had gotten bigger and more swollen. Patient reports she gets these bumps in warm areas and has a history of blood clots.    [place], [place]  03/25/2021, 13:05

## 2021-03-26 ENCOUNTER — Other Ambulatory Visit (INDEPENDENT_AMBULATORY_CARE_PROVIDER_SITE_OTHER): Payer: Self-pay | Admitting: Internal Medicine

## 2021-03-26 ENCOUNTER — Telehealth (INDEPENDENT_AMBULATORY_CARE_PROVIDER_SITE_OTHER): Payer: MEDICAID

## 2021-03-26 VITALS — BP 120/80 | HR 78 | Temp 98.0°F

## 2021-03-26 DIAGNOSIS — L0291 Cutaneous abscess, unspecified: Secondary | ICD-10-CM

## 2021-03-26 LAB — COMPREHENSIVE METABOLIC PANEL, NON-FASTING
ALBUMIN: 3 g/dL — ABNORMAL LOW (ref 3.5–5.0)
ALKALINE PHOSPHATASE: 119 U/L — ABNORMAL HIGH (ref 40–110)
ALT (SGPT): 12 U/L (ref 8–22)
ANION GAP: 11 mmol/L (ref 4–13)
AST (SGOT): 13 U/L (ref 8–45)
BILIRUBIN TOTAL: 0.3 mg/dL (ref 0.3–1.3)
BUN/CREA RATIO: 12 (ref 6–22)
BUN: 10 mg/dL (ref 8–25)
CALCIUM: 9.3 mg/dL (ref 8.5–10.0)
CHLORIDE: 99 mmol/L (ref 96–111)
CO2 TOTAL: 22 mmol/L (ref 22–30)
CREATININE: 0.83 mg/dL (ref 0.60–1.05)
ESTIMATED GFR: 90 mL/min/BSA (ref >=60)
GLUCOSE: 363 mg/dL — ABNORMAL HIGH (ref 65–125)
POTASSIUM: 4.3 mmol/L (ref 3.5–5.1)
PROTEIN TOTAL: 7.6 g/dL (ref 6.4–8.3)
SODIUM: 132 mmol/L — ABNORMAL LOW (ref 136–145)

## 2021-03-26 LAB — LIPID PANEL
CHOL/HDL RATIO: 6.2
CHOLESTEROL: 198 mg/dL (ref 100–200)
HDL CHOL: 32 mg/dL — ABNORMAL LOW (ref >=50)
LDL CALC: 133 mg/dL — ABNORMAL HIGH (ref <100)
NON-HDL: 166 mg/dL (ref <=190)
TRIGLYCERIDES: 182 mg/dL — ABNORMAL HIGH (ref <150)
VLDL CALC: 33 mg/dL — ABNORMAL HIGH (ref <30)

## 2021-03-26 LAB — STERILE SITE CULTURE AND GRAM STAIN, AEROBIC

## 2021-03-26 LAB — THYROID STIMULATING HORMONE (SENSITIVE TSH): TSH: 1.368 u[IU]/mL (ref 0.430–3.550)

## 2021-03-26 LAB — HGA1C (HEMOGLOBIN A1C WITH EST AVG GLUCOSE)
ESTIMATED AVERAGE GLUCOSE: 283 mg/dL
HEMOGLOBIN A1C: 11.5 % — ABNORMAL HIGH (ref 4.0–5.6)

## 2021-03-26 MED ORDER — LIDOCAINE HCL 10 MG/ML (1 %) INJECTION SOLUTION
1.0000 g | INTRAMUSCULAR | Status: AC
Start: 2021-03-26 — End: 2021-03-26
  Administered 2021-03-26: 1 g via INTRAMUSCULAR

## 2021-03-26 MED ORDER — TRAMADOL 50 MG TABLET
1.0000 | ORAL_TABLET | Freq: Two times a day (BID) | ORAL | 0 refills | Status: AC
Start: 2021-03-26 — End: 2021-04-05

## 2021-03-27 DIAGNOSIS — L0291 Cutaneous abscess, unspecified: Secondary | ICD-10-CM | POA: Insufficient documentation

## 2021-03-27 LAB — STERILE SITE CULTURE AND GRAM STAIN, AEROBIC

## 2021-03-27 NOTE — Progress Notes (Signed)
[facility]  [address] [facility] [address]  [place] [address]    History and Physical     Name: Yvette Schmidt MRN:  [id number]   Date: 03/25/2021 Age: 46 y.o.       Chief Complaint: swelling on the right upper leg    History of Present Illness   This is a 46 yrs old female pt   PMH of the HTN, T2DM, Hypothyroidism and hidradenitis suppurativa  She is here with the 1 month history of the pain full and progressive swelling of the right inner thigh   She do have fever on and off  Rates the pain as 8/10  She have history of repeated abscess but never had a bigger swelling like this.     No other concerns or complaints     Patient Active Problem List    Diagnosis   . Stress test          03/17/16--@ [facility]-EF 65%.  Normal Lexiscan MPS and stress.   . 2-D echo         03/17/16 mild MR and TR with EF of 60 to 65%   . Depression   . Headache   . Migraine Headaches   . GERD   . Sleep Apnea   . Asthma   . Diabetes Mellitus, Type II     Past Medical History:   Diagnosis Date   . Asthma    . COPD (chronic obstructive pulmonary disease) (CMS HCC)    . Diabetes mellitus, type 2 (CMS HCC)    . GERD (gastroesophageal reflux disease)    . Hypertension          Past Surgical History:   Procedure Laterality Date   . CORONARY ARTERY ANGIOPLASTY     . HX HERNIA REPAIR     . PB COLONOSCOPY,DIAGNOSTIC           Current Outpatient Medications   Medication Sig   . albuterol sulfate (PROVENTIL OR VENTOLIN OR PROAIR) 90 mcg/actuation Inhalation oral inhaler Take 1-2 Puffs by inhalation Every 6 hours as needed   . busPIRone (BUSPAR) 15 mg Oral Tablet Take 15 mg by mouth Three times a day (Patient not taking: Reported on 03/25/2021)   . cholecalciferol, Vitamin D3, 125 mcg (5,000 unit) Oral Tablet Take 1 Tab by mouth Twice daily   . cloNIDine HCl (CATAPRES) 0.1 mg Oral Tablet Take 0.1 mg by mouth Twice daily (Patient not taking: Reported on 03/25/2021)   . doxycycline 100 mg Oral Tablet Take 1 Tablet (100 mg total) by mouth Twice daily for 10 days    . EPINEPHrine (EPIPEN) 0.3 mg/0.3 mL Injection Auto-Injector 0.3 mg by Intramuscular route Once, as needed   . EPINEPHrine 0.3 mg/0.3 mL Injection Auto-Injector Inject 0.3 mL (0.3 mg total) into the muscle Once, as needed for up to 1 dose   . fluticasone propionate (FLONASE) 50 mcg/actuation Nasal Spray, Suspension Administer 2 Sprays into each nostril Twice daily   . gabapentin (NEURONTIN) 300 mg Oral Capsule Take 1 Capsule (300 mg total) by mouth Three times a day for 30 days   . hydrOXYzine pamoate (VISTARIL) 50 mg Oral Capsule Take 50 mg by mouth Three times a day as needed for Itching (Patient not taking: Reported on 03/25/2021)   . insulin asp prt-insulin aspart (NOVOLOG MIX 70/30) 100 unit/mL (70-30) Subcutaneous Insulin Pen Inject 10 Units under the skin Twice daily   . meloxicam (MOBIC) 15 mg Oral Tablet Take 15 mg by mouth  Once a day (Patient not taking: Reported on 03/25/2021)   . OXcarbazepine (TRILEPTAL) 600 mg Oral Tablet Take 600 mg by mouth Twice daily (Patient not taking: Reported on 03/25/2021)   . prazosin (MINIPRESS) 5 mg Oral Capsule Take 5 mg by mouth Once a day (Patient not taking: Reported on 03/25/2021)   . SUMAtriptan (IMITREX) 50 mg Oral Tablet Take 50 mg by mouth Once, as needed for Migraine May repeat in 2 hours in needed (Patient not taking: Reported on 03/25/2021)   . topiramate (TOPAMAX) 50 mg Oral Tablet Take 50 mg by mouth Twice daily (Patient not taking: Reported on 03/25/2021)   . traMADoL (ULTRAM) 50 mg Oral Tablet Take 1 Tablet (50 mg total) by mouth Twice daily for 10 days   . venlafaxine (EFFEXOR XR) 150 mg Oral Capsule, Sust. Release 24 hr Take 150 mg by mouth Once a day (Patient not taking: Reported on 03/25/2021)     Allergies   Allergen Reactions   . Beeswax      Allergic to [employer]   . Clindamycin    . Hydrocodone    . Other      Nutrasweet, sugar subs   . Sulfa (Sulfonamides)      HIVES   . Theophylline      ANXIETY     Family Medical History:    None         Social History      Tobacco Use   . Smoking status: Every Day     Packs/day: 0.50     Years: 15.00     Pack years: 7.50     Types: Cigarettes   . Smokeless tobacco: Never   Substance Use Topics   . Alcohol use: Yes     Comment: seldom      Review of Systems:  As per HPI all other 10 review of the sytem's are negative     Examination:  BP 118/82   Pulse (!) 118   Temp 36.4 C (97.5 F)   Ht 1.651 m (5\' 5" )   Wt (!) 141 kg (310 lb)   SpO2 93%   BMI 51.59 kg/m      Physical examination:  Examination  showed Clear chest  Normal S1 and s2  No legs swelling   MSK: examination showed Widespread rash on the body of the HS, a localized mobile swelling of the 6 into 8 inches visible on the right inner thigh, tender to palpate     Assessment and Plan  Problem List Items Addressed This Visit    None  Visit Diagnoses     T2DM (type 2 diabetes mellitus) (CMS HCC)    -  Primary    Relevant Orders    CBC (Completed)    COMPREHENSIVE METABOLIC PANEL, NON-FASTING (Completed)    THYROID STIMULATING HORMONE (SENSITIVE TSH) (Completed)    LIPID PANEL (Completed)    HGA1C (HEMOGLOBIN A1C WITH EST AVG GLUCOSE) (Completed)    Abscess        Relevant Orders    STERILE SITE CULTURE AND GRAM STAIN, AEROBIC - WOUND (Completed)    Incision & Drainage-ProcDoc (Completed)        [facility]  [address] [place], [place]    Procedure Note    Name: Yvette Schmidt MRN:  Marrion Coy   Date: 03/25/2021 Age: 46 y.o.       Incision & Drainage-ProcDoc    Date/Time: 03/25/2021 3:50 PM  Performed  by: Leroy Kennedy, MD  Authorized by: Leroy Kennedy, MD     Consent:     Consent obtained:  Verbal    Consent given by:  Patient    Risks, benefits, and alternatives were discussed: yes      Risks discussed:  Bleeding, incomplete drainage, pain and infection    Alternatives discussed:  No treatment  Location:     Type:  Abscess    Size:  6 into 4 inches    Location:  Lower extremity    Lower extremity location:  Leg    Leg location:   R upper leg  Pre-procedure details:     Skin preparation:  Povidone-iodine  Sedation:     Sedation type:  None  Anesthesia:     Anesthesia method:  Local infiltration    Local anesthetic:  Lidocaine 2% WITH epi  Procedure type:     Complexity:  Simple  Procedure details:     Ultrasound guidance: no      Needle aspiration: no      Incision types:  Stab incision    Incision depth:  Submucosal    Drainage:  Bloody and purulent    Drainage amount:  Moderate    Wound treatment:  Wound left open    Packing materials:  1/4 in iodoform gauze  Post-procedure details:     Procedure completion:  Tolerated  Comments:      Under the aseptic conditions using 11 size scalpel and forceps, incision of about 1 cm was made to the abscess on the medial inner right thigh , about 15 cc of pus is drained, culture is sent,  area is cleaned after the drainage  And packed with Iodoform gauze, sterile dressing is applied over it  pt is given IM rocephin 1 gm ad tramadol injection for pain.  advised to FU in 3 days for the review of her symptoms          Saeed Razaq, MA    Abscess:  Pt is Counseled about her condition   advised to keep herself well hydrated   Recommended and performed Incision and drainage of the abscess in  the clinic after Local Anesthesia, tolerated well, about 15 to 20 CC of the pus is drained,  Packing with done and sterile dressing was applied   Pus was sent for the C/S  inj Rocephin 1 gm Im stat   Inj Toradol 60 mg Im stat   Tab Doxycycline 100 mg PO BID for 10 days   Advised FU tomorrow for the dressing change and another shot of IM Rocephin  Blood is taken and sent for the CMP and CBC      T2DM:  Stable   Reports Home blood sugars are between 110 to 150  Advised to have a written record of home BP  She is on INSULIN ASPART 10 units BID  Blood is taken a sent for the A1C   Dose of the Insulin will be adjusted according to the A1C levels   Refills are given      Clarice Pole, MA   I am scribing for, and in the presence  of, Dr. Quincy Sheehan for services provided on 03/25/2021.  Clarice Pole, MA

## 2021-03-28 NOTE — Progress Notes (Signed)
[facility]  [place] [address]      Progress Note    TELEMEDICINE DOCUMENTATION:    Patient Location:  [facility], [address], [place], [place] [address]    Patient/family aware of provider location:  yes  Patient/family consent for telemedicine:  yes  Examination observed and performed by:  Philis Fendt, MA      Name: Yvette Schmidt  MRN: [id number]    Date: 03/26/2021  Age: 46 y.o.        Chief Complaint: FU for inj Rocephin     Subjective:   This is a 46 yrs old female pt   She is here for the follow up on her yesterday visit  She had an abscess of the right upper inner thigh   Abscess was drained yesterday and she was given inj rocephin and tramadol and sent home on doxycycline  No active complaints   No fever  No pain   No SOB  Swelling is much better with no pain right now in the thigh    Patient Active Problem List    Diagnosis   . Stress test          03/17/16--@ [facility] 65%.  Normal Lexiscan MPS and stress.   . 2-D echo         03/17/16 mild MR and TR with EF of 60 to 65%   . Depression   . Headache   . Migraine Headaches   . GERD   . Sleep Apnea   . Asthma   . Diabetes Mellitus, Type II          Past Medical History:   Diagnosis Date   . Asthma    . COPD (chronic obstructive pulmonary disease) (CMS HCC)    . Diabetes mellitus, type 2 (CMS HCC)    . GERD (gastroesophageal reflux disease)    . Hypertension              Past Surgical History:   Procedure Laterality Date   . CORONARY ARTERY ANGIOPLASTY     . HX HERNIA REPAIR     . PB COLONOSCOPY,DIAGNOSTIC       Objective :  BP 120/80   Pulse 78   Temp 36.7 C (98 F)      Physical examination:  Examination showed Clear chest  Normal S1 and s2  No legs swelling   Examination of the right showed leg showed dressing, which was removed and renewed,    Data reviewed:    Current Outpatient Medications   Medication Sig   . albuterol sulfate (PROVENTIL OR VENTOLIN OR PROAIR) 90 mcg/actuation  Inhalation oral inhaler Take 1-2 Puffs by inhalation Every 6 hours as needed   . busPIRone (BUSPAR) 15 mg Oral Tablet Take 15 mg by mouth Three times a day (Patient not taking: Reported on 03/25/2021)   . cholecalciferol, Vitamin D3, 125 mcg (5,000 unit) Oral Tablet Take 1 Tab by mouth Twice daily   . cloNIDine HCl (CATAPRES) 0.1 mg Oral Tablet Take 0.1 mg by mouth Twice daily (Patient not taking: Reported on 03/25/2021)   . doxycycline 100 mg Oral Tablet Take 1 Tablet (100 mg total) by mouth Twice daily for 10 days   . EPINEPHrine (EPIPEN) 0.3 mg/0.3 mL Injection Auto-Injector 0.3 mg by Intramuscular route Once, as needed   . EPINEPHrine 0.3 mg/0.3 mL Injection Auto-Injector Inject 0.3 mL (0.3 mg total) into the muscle Once, as needed  for up to 1 dose   . fluticasone propionate (FLONASE) 50 mcg/actuation Nasal Spray, Suspension Administer 2 Sprays into each nostril Twice daily   . gabapentin (NEURONTIN) 300 mg Oral Capsule Take 1 Capsule (300 mg total) by mouth Three times a day for 30 days   . hydrOXYzine pamoate (VISTARIL) 50 mg Oral Capsule Take 50 mg by mouth Three times a day as needed for Itching (Patient not taking: Reported on 03/25/2021)   . insulin asp prt-insulin aspart (NOVOLOG MIX 70/30) 100 unit/mL (70-30) Subcutaneous Insulin Pen Inject 10 Units under the skin Twice daily   . meloxicam (MOBIC) 15 mg Oral Tablet Take 15 mg by mouth Once a day (Patient not taking: Reported on 03/25/2021)   . OXcarbazepine (TRILEPTAL) 600 mg Oral Tablet Take 600 mg by mouth Twice daily (Patient not taking: Reported on 03/25/2021)   . prazosin (MINIPRESS) 5 mg Oral Capsule Take 5 mg by mouth Once a day (Patient not taking: Reported on 03/25/2021)   . SUMAtriptan (IMITREX) 50 mg Oral Tablet Take 50 mg by mouth Once, as needed for Migraine May repeat in 2 hours in needed (Patient not taking: Reported on 03/25/2021)   . topiramate (TOPAMAX) 50 mg Oral Tablet Take 50 mg by mouth Twice daily (Patient not taking: Reported on 03/25/2021)   .  traMADoL (ULTRAM) 50 mg Oral Tablet Take 1 Tablet (50 mg total) by mouth Twice daily for 10 days   . venlafaxine (EFFEXOR XR) 150 mg Oral Capsule, Sust. Release 24 hr Take 150 mg by mouth Once a day (Patient not taking: Reported on 03/25/2021)        Assessment/Plan  Problem List Items Addressed This Visit        Other    Abscess - Primary        Pt is Counseled on her condition  advised to keep the wound dry   Dressing will be changed on Monday on the FU   Inj Rocephin 1 gm IM stat is given   Continue with the doxycycline 100 mg PO BID for 10 days  Tab Tramadol 50 mg PO BID for 1 week for the pain   Advised to keep herself well hydrated   FU on Monday on the review of her symptoms and wound examination with dressing change     I am scribing for, and in the presence of, Dr. Miguel Aschoff for services provided on 03/26/2021.  Philis Fendt, MA

## 2021-03-29 ENCOUNTER — Other Ambulatory Visit: Payer: Self-pay

## 2021-03-29 ENCOUNTER — Telehealth (INDEPENDENT_AMBULATORY_CARE_PROVIDER_SITE_OTHER): Payer: MEDICAID

## 2021-03-29 VITALS — BP 132/80 | HR 120 | Temp 98.5°F | Ht 65.0 in | Wt 312.0 lb

## 2021-03-29 DIAGNOSIS — L0291 Cutaneous abscess, unspecified: Secondary | ICD-10-CM

## 2021-03-29 NOTE — Nursing Note (Signed)
Patient presents for a wound check to her incision done last Thursday. Patient reports she has been changing and keeping the would clean.    [place], [place]  03/29/2021, 13:51

## 2021-03-29 NOTE — Progress Notes (Signed)
[facility]  [address] [place] [facility] [address]      Progress Note    TELEMEDICINE DOCUMENTATION:    Patient Location:  [facility] [address] [place], [place] [address]    Patient/family aware of provider location:  yes  Patient/family consent for telemedicine:  yes  Examination observed and performed by:  Clarice Pole, MA      Name: Yvette Schmidt  MRN: [id number]    Date: 03/29/2021  Age: 46 y.o.        Chief Complaint: Wound Check and Abscess at the right upper leg medial side     Subjective:   This is a 46 yrs old female pt   Hx of T2DM, COPD with nicotine dependence, Hiradenitis suppurativa   and GAD with depression.  She is here for the follow up on Recent I and D which was done in clinic on Thursday  As per pt the swelling has reduced to half of the size, its still dripping the pus, she have multiple swelling in groin too on the same side right leg.  She is having fever on and off , she has reviewed two dose of 1 gm IM Rocephin and was prescribed Doxycycline for home.  No other concerns or complaints      Patient Active Problem List    Diagnosis   . Stress test          03/17/16--@ [facility] 65%.  Normal Lexiscan MPS and stress.   . 2-D echo         03/17/16 mild MR and TR with EF of 60 to 65%   . Depression   . Headache   . Migraine Headaches   . GERD   . Sleep Apnea   . Asthma   . Diabetes Mellitus, Type II          Past Medical History:   Diagnosis Date   . Asthma    . COPD (chronic obstructive pulmonary disease) (CMS HCC)    . Diabetes mellitus, type 2 (CMS HCC)    . GERD (gastroesophageal reflux disease)    . Hypertension                Past Surgical History:   Procedure Laterality Date   . CORONARY ARTERY ANGIOPLASTY     . HX HERNIA REPAIR     . PB COLONOSCOPY,DIAGNOSTIC           Objective :  BP 132/80   Pulse (!) 120   Temp 36.9 C (98.5 F)   Ht 1.651 m (5\' 5" )   Wt (!) 142 kg (312 lb)   SpO2 99%   BMI 51.92 kg/m      Physical  examination:  Examination by my assistant showed Clear chest  Normal S1 and s2  No legs swelling     Examination of the right upper leg showed swelling now reduced to the size of the Tennis ball, intact pyodine packing, small amount of the pus is still dripping. Positive tenderness to palpation, mass is mobile, several small masses in the right groin  Palpable too     Data reviewed:    Current Outpatient Medications   Medication Sig   . albuterol sulfate (PROVENTIL OR VENTOLIN OR PROAIR) 90 mcg/actuation Inhalation oral inhaler Take 1-2 Puffs by inhalation Every 6 hours as needed   . busPIRone (BUSPAR) 15 mg Oral Tablet Take 15 mg by mouth Three times a day (Patient not  taking: Reported on 03/25/2021)   . cholecalciferol, Vitamin D3, 125 mcg (5,000 unit) Oral Tablet Take 1 Tab by mouth Twice daily   . cloNIDine HCl (CATAPRES) 0.1 mg Oral Tablet Take 0.1 mg by mouth Twice daily (Patient not taking: Reported on 03/25/2021)   . doxycycline 100 mg Oral Tablet Take 1 Tablet (100 mg total) by mouth Twice daily for 10 days   . EPINEPHrine (EPIPEN) 0.3 mg/0.3 mL Injection Auto-Injector 0.3 mg by Intramuscular route Once, as needed   . EPINEPHrine 0.3 mg/0.3 mL Injection Auto-Injector Inject 0.3 mL (0.3 mg total) into the muscle Once, as needed for up to 1 dose   . fluticasone propionate (FLONASE) 50 mcg/actuation Nasal Spray, Suspension Administer 2 Sprays into each nostril Twice daily   . gabapentin (NEURONTIN) 300 mg Oral Capsule Take 1 Capsule (300 mg total) by mouth Three times a day for 30 days   . hydrOXYzine pamoate (VISTARIL) 50 mg Oral Capsule Take 50 mg by mouth Three times a day as needed for Itching (Patient not taking: Reported on 03/25/2021)   . insulin asp prt-insulin aspart (NOVOLOG MIX 70/30) 100 unit/mL (70-30) Subcutaneous Insulin Pen Inject 10 Units under the skin Twice daily   . meloxicam (MOBIC) 15 mg Oral Tablet Take 15 mg by mouth Once a day (Patient not taking: Reported on 03/25/2021)   . OXcarbazepine  (TRILEPTAL) 600 mg Oral Tablet Take 600 mg by mouth Twice daily (Patient not taking: Reported on 03/25/2021)   . prazosin (MINIPRESS) 5 mg Oral Capsule Take 5 mg by mouth Once a day (Patient not taking: Reported on 03/25/2021)   . SUMAtriptan (IMITREX) 50 mg Oral Tablet Take 50 mg by mouth Once, as needed for Migraine May repeat in 2 hours in needed (Patient not taking: Reported on 03/25/2021)   . topiramate (TOPAMAX) 50 mg Oral Tablet Take 50 mg by mouth Twice daily (Patient not taking: Reported on 03/25/2021)   . traMADoL (ULTRAM) 50 mg Oral Tablet Take 1 Tablet (50 mg total) by mouth Twice daily for 10 days   . venlafaxine (EFFEXOR XR) 150 mg Oral Capsule, Sust. Release 24 hr Take 150 mg by mouth Once a day (Patient not taking: Reported on 03/25/2021)        Assessment/Plan  Problem List Items Addressed This Visit        Other    Abscess - Primary        Pt is Counseled about her condition   Pus C/S is awaited   Blood work done showed WBC of 15  CMP is WNL  A1C of 11  The abscess look like multiloculated and is chronic of 3 months duration   Will need Ultrasound guided Drainage with a bigger incision   Pt  And her family are intrested to go to the ER at this stage   EMS is called   Pt is transferred to ER for the possible Ultrasound guided Drainage of the abscess  Pt will have follow up in clinic in 1-2 weeks,  after the hospital discharge       I am scribing for, and in the presence of, Dr. Quincy Sheehan for services provided on 03/29/2021.  Clarice Pole, MA

## 2021-03-30 ENCOUNTER — Other Ambulatory Visit (INDEPENDENT_AMBULATORY_CARE_PROVIDER_SITE_OTHER): Payer: Self-pay | Admitting: Internal Medicine

## 2021-03-30 MED ORDER — ALBUTEROL SULFATE 2.5 MG/3 ML (0.083 %) SOLUTION FOR NEBULIZATION
2.5000 mg | INHALATION_SOLUTION | Freq: Every day | RESPIRATORY_TRACT | Status: AC | PRN
Start: 2021-03-30 — End: 2021-06-28

## 2021-03-30 MED ORDER — IPRATROPIUM 0.5 MG-ALBUTEROL 3 MG (2.5 MG BASE)/3 ML NEBULIZATION SOLN
3.0000 mL | INHALATION_SOLUTION | Freq: Two times a day (BID) | RESPIRATORY_TRACT | 3 refills | Status: DC | PRN
Start: 2021-03-30 — End: 2022-04-14

## 2021-03-30 NOTE — Addendum Note (Signed)
Addended by: Thurmon Fair on: 03/30/2021 11:36 AM     Modules accepted: Orders

## 2021-03-31 ENCOUNTER — Telehealth (INDEPENDENT_AMBULATORY_CARE_PROVIDER_SITE_OTHER): Payer: Self-pay | Admitting: Internal Medicine

## 2021-03-31 NOTE — Telephone Encounter (Signed)
Pt was referred to [facility] for evaluation and treatment.  They were unable to reach pt and called the provider office to schedule.  Pt is scheduled with [facility] for 04-02-21 @1 :00  [address] [place], [place]   I phoned patient to let her know about this appointment date and time.  She did indicate that there may be a conflict and she may not be able to do that appt so patient was given the phone number to contact the Wound Care directly to reschedule if necessary.  I stressed that the patient needed to try to keep this appointment due to the ongoing issues with the abscess on her leg/thigh and she said she was aware and would do what she could but had to rely on other transportation to get her there.    Linda Shoulders  03/31/2021, 12:11

## 2021-04-01 ENCOUNTER — Telehealth (INDEPENDENT_AMBULATORY_CARE_PROVIDER_SITE_OTHER): Payer: MEDICAID

## 2021-04-01 ENCOUNTER — Other Ambulatory Visit (INDEPENDENT_AMBULATORY_CARE_PROVIDER_SITE_OTHER): Payer: Self-pay | Admitting: Internal Medicine

## 2021-04-01 ENCOUNTER — Other Ambulatory Visit: Payer: Self-pay

## 2021-04-01 VITALS — BP 122/80 | HR 93 | Temp 98.0°F | Ht 65.0 in | Wt 311.0 lb

## 2021-04-01 DIAGNOSIS — L0291 Cutaneous abscess, unspecified: Secondary | ICD-10-CM

## 2021-04-01 MED ORDER — IPRATROPIUM 0.5 MG-ALBUTEROL 3 MG (2.5 MG BASE)/3 ML NEBULIZATION SOLN
3.0000 mL | INHALATION_SOLUTION | Freq: Four times a day (QID) | RESPIRATORY_TRACT | 0 refills | Status: DC
Start: 2021-04-01 — End: 2022-04-14

## 2021-04-01 MED ORDER — BLOOD SUGAR DIAGNOSTIC STRIPS
1.0000 | ORAL_STRIP | Freq: Four times a day (QID) | 1 refills | Status: DC | PRN
Start: 2021-04-01 — End: 2021-04-28

## 2021-04-01 MED ORDER — DOXYCYCLINE HYCLATE 100 MG TABLET
100.0000 mg | ORAL_TABLET | Freq: Two times a day (BID) | ORAL | 0 refills | Status: DC
Start: 2021-04-01 — End: 2021-05-21

## 2021-04-01 MED ORDER — LIDOCAINE HCL 10 MG/ML (1 %) INJECTION SOLUTION
1.0000 g | Freq: Once | INTRAMUSCULAR | Status: AC
Start: 2021-04-01 — End: 2021-04-01
  Administered 2021-04-01: 1 g via INTRAMUSCULAR

## 2021-04-01 MED ORDER — OXYCODONE 5 MG TABLET
5.0000 mg | ORAL_TABLET | Freq: Four times a day (QID) | ORAL | 0 refills | Status: AC | PRN
Start: 2021-04-01 — End: 2021-04-11

## 2021-04-01 MED ORDER — INSULIN ASPAR PROT-INSULIN ASPART 100 UNIT/ML (70-30) SUBCUTANEOUS PEN
10.0000 [IU] | PEN_INJECTOR | Freq: Two times a day (BID) | SUBCUTANEOUS | 2 refills | Status: DC
Start: 2021-04-01 — End: 2021-07-02

## 2021-04-01 MED ORDER — KETOROLAC 60 MG/2 ML INTRAMUSCULAR SOLUTION
60.0000 mg | Freq: Once | INTRAMUSCULAR | Status: AC
Start: 2021-04-01 — End: 2021-04-01
  Administered 2021-04-01: 60 mg via INTRAMUSCULAR

## 2021-04-01 NOTE — Nursing Note (Signed)
Patient presents for a wound check and for facial congestion. Patient reports the ER did not help like she expected. Patient now reports she has facial congestion in which began a few days ago and productive cough. +yellow sputum     [place], MA  04/01/2021, 09:30

## 2021-04-01 NOTE — Procedures (Signed)
[facility]  [address]  [place] [address]    Procedure Note    Name: Yvette Schmidt MRN:  [id number]   Date: 04/01/2021 Age: 46 y.o.       Incision & Drainage-ProcDoc    Date/Time: 04/01/2021 10:45 AM  Performed by: Leroy Kennedy, MD  Authorized by: Leroy Kennedy, MD     Consent:     Consent obtained:  Verbal    Consent given by:  Patient    Risks, benefits, and alternatives were discussed: yes      Risks discussed:  Bleeding, incomplete drainage and pain    Alternatives discussed:  No treatment  Pre-procedure details:     Skin preparation:  Chlorhexidine with alcohol  Sedation:     Sedation type:  None  Anesthesia:     Anesthesia method:  Local infiltration    Local anesthetic:  Lidocaine 2% WITH epi  Procedure type:     Complexity:  Simple  Procedure details:     Ultrasound guidance: no      Needle aspiration: no      Incision types:  Stab incision    Incision depth:  Subcutaneous    Drainage:  Purulent and bloody    Drainage amount:  Scant    Wound treatment:  Wound left open    Packing materials:  1/4 in iodoform gauze  Post-procedure details:     Procedure completion:  Tolerated  Comments:      Under the aseptic conditions, after getting the informed consent, lidocaine 10 ml iis injected around the swelling, open open is still there from the previous I and D, with the help of the 11 blade and forcep the abscess is access with the open wound, about 10 ml of the blood and purulent fluid is drained, packing is done with iodoform gauze and sterile dressing is applied, pt was given inj Toradol for pain and inj rocephin for the prevention of infection, pt will FU in the office in 3 days           Clarice Pole, MA

## 2021-04-01 NOTE — Progress Notes (Signed)
[facility]  [address] [place] [facility] [address]      Progress Note    TELEMEDICINE DOCUMENTATION:    Patient Location:  [name] [address] [place], [place] [address]    Patient/family aware of provider location:  yes  Patient/family consent for telemedicine:  yes  Examination observed and performed by:  Clarice Pole, MA      Name: Yvette Schmidt  MRN: [id number]    Date: 04/01/2021  Age: 46 y.o.        Chief Complaint: Follow up on her wound and recent ER visit    Subjective:   This is a 46 yrs old female pt  Hx of T2DM, COPD and Heradenitis Suppurativa  She is here fr the follow up on  Her wound and leg swelling   Last week she presented with the abscess/swelling of the right thigh, it was drained in the clinic but it was chronic and septated.  On the follow she was referred to the ER for the possible Ultrasound guided I and D and aspiration of the abscess.  She is here today for the follow up from the ER  As per pt they did only U/S in the ER and sent her back home on  tramdaol and antibiotics,   Pt is still complaining of the pain as 8/10, the wound is still open with iodoform Jason Nest which was last put in the ER  It sis still draining  The swelling is much better then before as per pt.  No fever  No chills  No other concerns or complaints       Patient Active Problem List    Diagnosis   . Stress test  03/17/16--@ [facility] 65%. Normal Lexiscan MPS and stress.   . 2-D echo  03/17/16 mild MR and TR with EF of 60 to 65%   . Depression   . Headache   . Migraine Headaches   . GERD   . Sleep Apnea   . Asthma   . Diabetes Mellitus, Type II          Past Medical History:   Diagnosis Date   . Asthma    . COPD (chronic obstructive pulmonary disease) (CMS HCC)    . Diabetes mellitus, type 2 (CMS HCC)    . GERD (gastroesophageal reflux disease)    . Hypertension                Past Surgical History:   Procedure Laterality Date   . CORONARY ARTERY  ANGIOPLASTY     . HX HERNIA REPAIR     . PB COLONOSCOPY,DIAGNOSTIC         Objective :  BP 122/80   Pulse 93   Temp 36.7 C (98 F)   Ht 1.651 m (5\' 5" )   Wt (!) 141 kg (311 lb)   SpO2 96%   BMI 51.75 kg/m      Physical examination:  Examination by my assistant showed Clear chest  Normal S1 and s2    Examination of the right upper leg showed swelling now reduced to the size of the Gold ball, intact pyodine packing, small amount of the pus is still dripping. Positive tenderness to palpation, mass is mobile, several small masses in the right groin  Palpable too   Data reviewed:    Current Outpatient Medications   Medication Sig   . albuterol sulfate (PROVENTIL OR VENTOLIN OR PROAIR) 90 mcg/actuation Inhalation oral  inhaler Take 1-2 Puffs by inhalation Every 6 hours as needed   . busPIRone (BUSPAR) 15 mg Oral Tablet Take 15 mg by mouth Three times a day (Patient not taking: Reported on 03/25/2021)   . cholecalciferol, Vitamin D3, 125 mcg (5,000 unit) Oral Tablet Take 1 Tab by mouth Twice daily   . cloNIDine HCl (CATAPRES) 0.1 mg Oral Tablet Take 0.1 mg by mouth Twice daily (Patient not taking: Reported on 03/25/2021)   . doxycycline 100 mg Oral Tablet Take 1 Tablet (100 mg total) by mouth Twice daily for 10 days   . doxycycline 100 mg Oral Tablet Take 1 Tablet (100 mg total) by mouth Twice daily for 10 days   . EPINEPHrine (EPIPEN) 0.3 mg/0.3 mL Injection Auto-Injector 0.3 mg by Intramuscular route Once, as needed   . EPINEPHrine 0.3 mg/0.3 mL Injection Auto-Injector Inject 0.3 mL (0.3 mg total) into the muscle Once, as needed for up to 1 dose   . fluticasone propionate (FLONASE) 50 mcg/actuation Nasal Spray, Suspension Administer 2 Sprays into each nostril Twice daily   . gabapentin (NEURONTIN) 300 mg Oral Capsule Take 1 Capsule (300 mg total) by mouth Three times a day for 30 days   . hydrOXYzine pamoate (VISTARIL) 50 mg Oral Capsule Take 50 mg by mouth Three times a day as needed for Itching (Patient not  taking: Reported on 03/25/2021)   . insulin asp prt-insulin aspart (NOVOLOG MIX 70/30) 100 unit/mL (70-30) Subcutaneous Insulin Pen Inject 10 Units under the skin Twice daily   . ipratropium-albuterol 0.5 mg-3 mg(2.5 mg base)/3 mL Solution for Nebulization Take 3 mL by nebulization Twice per day as needed for Wheezing for up to 30 days   . ipratropium-albuterol 0.5 mg-3 mg(2.5 mg base)/3 mL Solution for Nebulization Take 3 mL by nebulization Four times a day for 30 days   . meloxicam (MOBIC) 15 mg Oral Tablet Take 15 mg by mouth Once a day (Patient not taking: Reported on 03/25/2021)   . OXcarbazepine (TRILEPTAL) 600 mg Oral Tablet Take 600 mg by mouth Twice daily (Patient not taking: Reported on 03/25/2021)   . oxyCODONE (ROXICODONE) 5 mg Oral Tablet Take 1 Tablet (5 mg total) by mouth Every 6 hours as needed for Pain for up to 10 days   . prazosin (MINIPRESS) 5 mg Oral Capsule Take 5 mg by mouth Once a day (Patient not taking: Reported on 03/25/2021)   . SUMAtriptan (IMITREX) 50 mg Oral Tablet Take 50 mg by mouth Once, as needed for Migraine May repeat in 2 hours in needed (Patient not taking: Reported on 03/25/2021)   . topiramate (TOPAMAX) 50 mg Oral Tablet Take 50 mg by mouth Twice daily (Patient not taking: Reported on 03/25/2021)   . traMADoL (ULTRAM) 50 mg Oral Tablet Take 1 Tablet (50 mg total) by mouth Twice daily for 10 days   . venlafaxine (EFFEXOR XR) 150 mg Oral Capsule, Sust. Release 24 hr Take 150 mg by mouth Once a day (Patient not taking: Reported on 03/25/2021)        Assessment/Plan  Problem List Items Addressed This Visit        Other    Abscess - Primary    Relevant Orders    Incision & Drainage-ProcDoc (Completed)        [facility]  [address]  [facility] [address]    Procedure Note    Name: Yvette Schmidt MRN:  [id number]   Date: 04/01/2021 Age: 46 y.o.  Incision & Drainage-ProcDoc    Date/Time: 04/01/2021 10:45 AM  Performed by: Leroy KennedyBharti, Ellyn Rubiano Ranjit, MD  Authorized by: Leroy KennedyBharti,  Londan Coplen Ranjit, MD     Consent:     Consent obtained:  Verbal    Consent given by:  Patient    Risks, benefits, and alternatives were discussed: yes      Risks discussed:  Bleeding, incomplete drainage and pain    Alternatives discussed:  No treatment  Pre-procedure details:     Skin preparation:  Chlorhexidine with alcohol  Sedation:     Sedation type:  None  Anesthesia:     Anesthesia method:  Local infiltration    Local anesthetic:  Lidocaine 2% WITH epi  Procedure type:     Complexity:  Simple  Procedure details:     Ultrasound guidance: no      Needle aspiration: no      Incision types:  Stab incision    Incision depth:  Subcutaneous    Drainage:  Purulent and bloody    Drainage amount:  Scant    Wound treatment:  Wound left open    Packing materials:  1/4 in iodoform gauze  Post-procedure details:     Procedure completion:  Tolerated  Comments:      Under the aseptic conditions, after getting the informed consent, lidocaine 10 ml iis injected around the swelling, open open is still there from the previous I and D, with the help of the 11 blade and forcep the abscess is access with the open wound, about 10 ml of the blood and purulent fluid is drained, packing is done with iodoform gauze and sterile dressing is applied, pt was given inj Toradol for pain and inj rocephin for the prevention of infection, pt will FU in the office in 3 days           Saeed Razaq, MA    Pt is Counseled about her condition   She is recommended another session of I and D to drain the pus  Pt agreed  performed Incision and drainage of the abscess in  the clinic after Local Anesthesia, tolerated well, about 5 t 10 CC of the blood stained pus is drained,  Packing with done and sterile dressing was applied   inj Rocephin 1 gm Im stat   Inj Toradol 60 mg Im stat   Tab Doxycycline 100 mg PO BID for 10 days   Pus C/S is awaited   Blood work done showed WBC of 15  CMP is WNL  FU on Monday     I am scribing for, and in the presence of, Dr.  Quincy SheehanBharti for services provided on 04/01/2021.  Clarice PoleSaeed Razaq, MA

## 2021-04-05 ENCOUNTER — Other Ambulatory Visit: Payer: Self-pay

## 2021-04-05 ENCOUNTER — Telehealth (INDEPENDENT_AMBULATORY_CARE_PROVIDER_SITE_OTHER): Payer: MEDICAID

## 2021-04-05 VITALS — BP 132/82 | HR 115 | Temp 98.2°F | Ht 65.0 in | Wt 310.0 lb

## 2021-04-05 DIAGNOSIS — T148XXA Other injury of unspecified body region, initial encounter: Secondary | ICD-10-CM | POA: Insufficient documentation

## 2021-04-05 DIAGNOSIS — L0291 Cutaneous abscess, unspecified: Secondary | ICD-10-CM

## 2021-04-05 NOTE — Progress Notes (Signed)
[facility]  [address] [place] [facility] [address]      Progress Note    TELEMEDICINE DOCUMENTATION:    Patient Location:  [name] [address] [place], [place] [address]    Patient/family aware of provider location:  yes  Patient/family consent for telemedicine:  yes  Examination observed and performed by:  Clarice Pole, MA      Name: Yvette Schmidt  MRN: [id number]    Date: 04/05/2021  Age: 46 y.o.        Chief Complaint: Wound Check     Subjective:   This is a 46 yrs old female pt   Hx of T2DM, COPD and Heradenitis Suppurativa  She is here for the follow up on her wound check  She was recently diagnosed with abscess right inner thigh   She under went two I and D performed in the clinic  She also had an ER visit last week too, but she was sent home after getting the Ultrasound only  Pt is feeling much better   No fever   Still have pain 5/10  The abscess is now reduced to the size of the golf ball. Drainage still continues     No other concerns or complaints     Patient Active Problem List    Diagnosis   . Stress test  03/17/16--@ [facility]-EF 65%. Normal Lexiscan MPS and stress.   . 2-D echo  03/17/16 mild MR and TR with EF of 60 to 65%   . Depression   . Headache   . Migraine Headaches   . GERD   . Sleep Apnea   . Asthma   . Diabetes Mellitus, Type II          Past Medical History:   Diagnosis Date   . Asthma    . COPD (chronic obstructive pulmonary disease) (CMS HCC)    . Diabetes mellitus, type 2 (CMS HCC)    . GERD (gastroesophageal reflux disease)    . Hypertension              Past Surgical History:   Procedure Laterality Date   . CORONARY ARTERY ANGIOPLASTY     . HX HERNIA REPAIR     . PB COLONOSCOPY,DIAGNOSTIC           Objective :  BP 132/82   Pulse (!) 115   Temp 36.8 C (98.2 F)   Ht 1.651 m (5\' 5" )   Wt (!) 141 kg (310 lb)   SpO2 (!) 89%   BMI 51.59 kg/m      Physical examination:  Examination by my assistant  showed  Resp; Clear chest  CVS; Normal S1 and s2  MSK;  Examination of the right upper leg showed swelling now reduced to the size of the Golf ball, intact pyodine packing, small amount of the pus is still dripping. Very mild tenderness to palpation, mass is mobile, several small masses in the right groin Palpable too     Data reviewed:    Current Outpatient Medications   Medication Sig   . albuterol sulfate (PROVENTIL OR VENTOLIN OR PROAIR) 90 mcg/actuation Inhalation oral inhaler Take 1-2 Puffs by inhalation Every 6 hours as needed   . Blood Sugar Diagnostic (ONETOUCH VERIO TEST STRIPS) Strip 1 Strip Four times a day as needed for up to 30 days   . busPIRone (BUSPAR) 15 mg Oral Tablet Take 15 mg by mouth Three times a day (Patient  not taking: Reported on 03/25/2021)   . cholecalciferol, Vitamin D3, 125 mcg (5,000 unit) Oral Tablet Take 1 Tab by mouth Twice daily   . cloNIDine HCl (CATAPRES) 0.1 mg Oral Tablet Take 0.1 mg by mouth Twice daily (Patient not taking: Reported on 03/25/2021)   . doxycycline 100 mg Oral Tablet Take 1 Tablet (100 mg total) by mouth Twice daily for 10 days   . EPINEPHrine (EPIPEN) 0.3 mg/0.3 mL Injection Auto-Injector 0.3 mg by Intramuscular route Once, as needed   . EPINEPHrine 0.3 mg/0.3 mL Injection Auto-Injector Inject 0.3 mL (0.3 mg total) into the muscle Once, as needed for up to 1 dose   . fluticasone propionate (FLONASE) 50 mcg/actuation Nasal Spray, Suspension Administer 2 Sprays into each nostril Twice daily   . gabapentin (NEURONTIN) 300 mg Oral Capsule Take 1 Capsule (300 mg total) by mouth Three times a day for 30 days   . hydrOXYzine pamoate (VISTARIL) 50 mg Oral Capsule Take 50 mg by mouth Three times a day as needed for Itching (Patient not taking: Reported on 03/25/2021)   . insulin asp prt-insulin aspart (NOVOLOG MIX 70/30) 100 unit/mL (70-30) Subcutaneous Insulin Pen Inject 10 Units under the skin Twice daily   . ipratropium-albuterol 0.5 mg-3 mg(2.5 mg base)/3 mL Solution  for Nebulization Take 3 mL by nebulization Twice per day as needed for Wheezing for up to 30 days   . ipratropium-albuterol 0.5 mg-3 mg(2.5 mg base)/3 mL Solution for Nebulization Take 3 mL by nebulization Four times a day for 30 days   . meloxicam (MOBIC) 15 mg Oral Tablet Take 15 mg by mouth Once a day (Patient not taking: Reported on 03/25/2021)   . OXcarbazepine (TRILEPTAL) 600 mg Oral Tablet Take 600 mg by mouth Twice daily (Patient not taking: Reported on 03/25/2021)   . oxyCODONE (ROXICODONE) 5 mg Oral Tablet Take 1 Tablet (5 mg total) by mouth Every 6 hours as needed for Pain for up to 10 days   . prazosin (MINIPRESS) 5 mg Oral Capsule Take 5 mg by mouth Once a day (Patient not taking: Reported on 03/25/2021)   . SUMAtriptan (IMITREX) 50 mg Oral Tablet Take 50 mg by mouth Once, as needed for Migraine May repeat in 2 hours in needed (Patient not taking: Reported on 03/25/2021)   . topiramate (TOPAMAX) 50 mg Oral Tablet Take 50 mg by mouth Twice daily (Patient not taking: Reported on 03/25/2021)   . traMADoL (ULTRAM) 50 mg Oral Tablet Take 1 Tablet (50 mg total) by mouth Twice daily for 10 days   . venlafaxine (EFFEXOR XR) 150 mg Oral Capsule, Sust. Release 24 hr Take 150 mg by mouth Once a day (Patient not taking: Reported on 03/25/2021)        Assessment/Plan  Problem List Items Addressed This Visit        Other    Abscess - Primary    Open wound      FLC 3+ Several Methicillin Resistant Staphylococcus aureusAbnormal               GRAM STAIN  2+ Few PMNs      2+ Few Gram Positive Cocci/Clusters              Susceptibility     Methicillin Resistant Staphylococcus aureus     MIC SUSCEPTIBILITY     Clindamycin 0.25 mcg/mL Sensitive     Daptomycin 0.25 mcg/mL Sensitive     Doxycycline <=0.5 mcg/mL Sensitive     Erythromycin <=0.25 mcg/mL  Sensitive     Gentamicin <=0.5 mcg/mL Sensitive 1     Linezolid 2 mcg/mL Sensitive     Oxacillin >=4 mcg/mL Resistant     Rifampin <=0.5 mcg/mL Sensitive 2     Tetracycline <=1 mcg/mL  Sensitive     Tigecycline <=0.12 mcg/mL Sensitive     Trimethoprim/Sulfamethoxazole <=10 mcg/mL Sensitive     Vancomycin 1 mcg/mL Sensitive                                    Pt is Counseled about her condition   Wound and swelling is  Much better  Records are reviewed   U/S leg done the Outside facility last week showed no abnormality, and collection  Blood work showed WBC of 11.2, initially was 15  CMP is WNL  Pus C/S showed MRSA sensitive to Doxycycline   Tab Doxycycline 100 mg PO BID continues   CMP is WNL  She is going to see the wound specialist on wed  FU in 1 week for the review      I am scribing for, and in the presence of, Dr. Quincy Sheehan for services provided on 04/05/2021.  Clarice Pole, MA

## 2021-04-05 NOTE — Nursing Note (Signed)
Patient presents for a wound check and new symptoms including fevers on and off, body aches, fatigue, shortness of breath, and hot/cold chills. Patient reports she started these symptoms over the weekend.     [place], [place]  04/05/2021, 09:55

## 2021-04-07 ENCOUNTER — Other Ambulatory Visit: Payer: Self-pay

## 2021-04-07 ENCOUNTER — Ambulatory Visit (HOSPITAL_COMMUNITY): Payer: MEDICAID

## 2021-04-07 ENCOUNTER — Ambulatory Visit: Payer: MEDICAID | Attending: FAMILY PRACTICE | Admitting: FAMILY PRACTICE

## 2021-04-07 ENCOUNTER — Encounter (HOSPITAL_BASED_OUTPATIENT_CLINIC_OR_DEPARTMENT_OTHER): Payer: Self-pay | Admitting: FAMILY PRACTICE

## 2021-04-07 DIAGNOSIS — L02415 Cutaneous abscess of right lower limb: Secondary | ICD-10-CM | POA: Insufficient documentation

## 2021-04-07 DIAGNOSIS — F17210 Nicotine dependence, cigarettes, uncomplicated: Secondary | ICD-10-CM | POA: Insufficient documentation

## 2021-04-07 DIAGNOSIS — E119 Type 2 diabetes mellitus without complications: Secondary | ICD-10-CM

## 2021-04-07 DIAGNOSIS — Z6841 Body Mass Index (BMI) 40.0 and over, adult: Secondary | ICD-10-CM

## 2021-04-07 DIAGNOSIS — E1165 Type 2 diabetes mellitus with hyperglycemia: Secondary | ICD-10-CM | POA: Insufficient documentation

## 2021-04-07 MED ORDER — LIDOCAINE HCL 2 % MUCOSAL JELLY
5.0000 mL | Freq: Once | 0 refills | Status: AC
Start: 2021-04-07 — End: 2021-04-07

## 2021-04-07 NOTE — Nursing Note (Signed)
04/07/21 0900   Wound (Non-Surgical) Right;Inner Thigh   Initial Date of Wound Assessment/Initial Time of Wound Assessment: 04/07/21 1093   Present on Hospital Admission: No  Wound Approximate Age at First Assessment (Weeks): 4 weeks  Primary Wound Type: (c) Other (comment)  Wound Number [facility]): 1  O...   LDAW Image    State of Healing Non-healing   Wound Thickness Full Thickness   WOUND SITE CLOSURE None   Periwound Hyperpigmentation;Induration;Maceration   Wound Edges Flat and Intact   Wound Bed unable to visualize wound base   Wound Length (cm) 0.6 cm   Wound Width (cm) 0.7 cm   Wound Depth (cm) 2 cm   Wound Volume (cm^3) 0.84 cm^3   Wound Surface Area (cm^2) 0.42 cm^2   Wound Undermining No   Wound Tunneling  No   Wound Drainage Amount Large   Exudate/Drainage Yellow   Wound Odor Absent   DRESSING STATUS Removed

## 2021-04-07 NOTE — Nursing Note (Signed)
04/07/21 0900   OTHER   Cleansed with saline   Primary dressing dakins;packing strip   Secondary dressing border   Location right upper thight

## 2021-04-07 NOTE — Nursing Note (Signed)
04/07/21 0900   OTHER   Cleansed with saline   Primary dressing dakins;packing strip   Secondary dressing border   Location right upper thight

## 2021-04-07 NOTE — Nursing Note (Signed)
Patient seen today in clinic as a new patient. Attempted to care for patient and give different avenues of care with patient becoming extremely anxious and upset stating the staff was not offering any help in healing her wound. Will proceed with Dakins packing and border dressing for now and will see patient in clinic in a week.

## 2021-04-07 NOTE — Patient Instructions (Signed)
.Caring For Your Ulcers  Healthy skin is a natural barrier that prevents infection. A break in your skin makes it possible for germs to enter your body. Covering your wound will help to keep it clean and heal faster.    Your Nurse Will Show You:  ~How to take care of your dressing  ~When and how to change your dressing  ~What to watch for as your wound heals.    Call the Wound Care Center [phone] Immediately if You Notice:  ~Increased pain at the wound site  ~Excess drainage wets the dressing before it is time to change the dressing  ~Redness or swelling around or spreading away from the wound  ~Foul odor coming from the wound  ~Change in color or amount of drainage from the wound  ~Fever and chills  ~Nausea and vomiting    Changing Your Dressing:  Gather the supplies you will need for your dressing change:    -Trash bag    -Wound Cleanser    -Hand washing supplies    Tape or gauze wrap to hold your dressing in place  2. Wash your hand with soap and water. Put on gloves if available.  3. Prepare a clean working area.  4. Take your dressing off carefully.Throw away the old dressing in a trash bag. Try to keep the wound clean.  5. Look at your wound closely. Look for any foul odors; change in color or amount of drainage; redness or swelling around the wound; or redness spreading away from the wound.  6. Wash your hands with soap and water.  7. Clean your wound using the normal saline.  8. Put on a new dressing.  9. Put the plastic bag with the old dressing in another plastic bag and put directly in the trash.  10. Wash your hands one last time with soap and water.    General Information  ~Change your wound dressing as directed by the Wound Care Center staff or if it gets dirty or wet.  ~Do not put anything in your open wound that is not prescribed by your physician.  ~Change your dressing as close to hand washing facilities as possible.    Storing Your Dressing Supplies  ~Always keep your clean dressings in a  storage container that has a lid and is kept off of the floor away from children and pets. A plastic container with a lid or clean large plastic bag that can be tied shut will be best.  ~When preparing a clean working area, use a paper towel or other clean cover to put your supplies on. DO NOT put supplies directly on a table or bed.    Helping Your Wound Heal  ~Keep the outside of your dressing clean and dry. If it becomes soiled or wet, change it as soon as possible.  ~Keep your body clean. Bathe daily with soap and water as allowed by your wound care provider. Change your dressing after your shower or bath.  ~Eat a well balanced diet. Follow special dietary or fluid restrictions that your doctor has discussed.   ~Examine your wound carefully every time you remove your dressing. Immediately report any changes to your Wound Care Center staff.    The Proper Way to Wash Hands  ~Remove your jewelry before washing your hands so that the spaces between your fingers can be cleaned and dried.  ~Adjust the water temperature and lather your hands with soap.  ~Rub your hands together, cleaning the front and   back of each hand up to the wrist and between all fingers for 20 seconds (Sing the Happy Birthday song 2 times or the chorus of Yankee Doodle is about 20 seconds).  ~Rinse well.  ~If possible turn the faucets off with a paper towel or towel.

## 2021-04-07 NOTE — H&P (Signed)
[facility]  [address] [place] [address]    Progress Note    Patient Name: Yvette Schmidt   MRN:  [id number]   DOB: Jun 15, 1975   Date of Service:  04/07/2021     PCP: Leroy Kennedy, MD  Referring provider: Leroy Kennedy, *  Information Provided by: patient  Chief Complaint:  Wound    History of Present Illness:   Patient is a 46 y.o. White female who presents for a new evaluation of an open abscess of the right upper thigh. The patient has history of hidradenitis suppurative, per her statement, but it is uncertain of what diagnostics have been done to confirm this.  She states she is currently not being treated for HS.  She is also a type 2 diabetic,  poorly controlled with a most recent hemoglobin A1C of 11.5.   She is extremely hostile and a very poor historian in the room today.  She does not understand why she is here today, as she states we just do the same as everyone else has been doing, but not taking care of her pain.  She is angry that the wound needs to be packed with material and objects to everything we suggest.  She is offered home health services and became angry about that because she said absolutely no one is allowed on her property.  Apparently, by her Epic record, she had an incision and drainage of the abscess on 03/25/2021 by Dr. Quincy Sheehan at [facility] in [place].  About 15 cc of purulent material was drained and sent for culture.  The culture yielded MRSA and she is currently being treated with doxycycline, which she confirms she is taking.  She also received a dose of IV rocephin.  She was apparently transferred to ER on 03/29/2021 for ultrasound guided incision and drainage.  Today, she has an open wound with induration.  It is not spontaneously draining and has no odor.  She denies fever or chills.     No change in PMH, PSH, Meds, Allergies or FHx since last visit.    Past Medical/Family/Social History:    PMH/PSH:  Past Medical  History:   Diagnosis Date   . Asthma    . COPD (chronic obstructive pulmonary disease) (CMS HCC)    . Diabetes mellitus, type 2 (CMS HCC)    . GERD (gastroesophageal reflux disease)    . Hypertension            Medications:  Current Outpatient Medications   Medication Sig   . albuterol sulfate (PROVENTIL OR VENTOLIN OR PROAIR) 90 mcg/actuation Inhalation oral inhaler Take 1-2 Puffs by inhalation Every 6 hours as needed   . Blood Sugar Diagnostic (ONETOUCH VERIO TEST STRIPS) Strip 1 Strip Four times a day as needed for up to 30 days   . busPIRone (BUSPAR) 15 mg Oral Tablet Take 15 mg by mouth Three times a day (Patient not taking: Reported on 03/25/2021)   . cholecalciferol, Vitamin D3, 125 mcg (5,000 unit) Oral Tablet Take 1 Tab by mouth Twice daily   . cloNIDine HCl (CATAPRES) 0.1 mg Oral Tablet Take 0.1 mg by mouth Twice daily (Patient not taking: Reported on 03/25/2021)   . doxycycline 100 mg Oral Tablet Take 1 Tablet (100 mg total) by mouth Twice daily for 10 days   . EPINEPHrine (EPIPEN) 0.3 mg/0.3 mL Injection Auto-Injector 0.3 mg by Intramuscular route Once, as needed   . EPINEPHrine 0.3  mg/0.3 mL Injection Auto-Injector Inject 0.3 mL (0.3 mg total) into the muscle Once, as needed for up to 1 dose   . fluticasone propionate (FLONASE) 50 mcg/actuation Nasal Spray, Suspension Administer 2 Sprays into each nostril Twice daily   . gabapentin (NEURONTIN) 300 mg Oral Capsule Take 1 Capsule (300 mg total) by mouth Three times a day for 30 days   . hydrOXYzine pamoate (VISTARIL) 50 mg Oral Capsule Take 50 mg by mouth Three times a day as needed for Itching (Patient not taking: Reported on 03/25/2021)   . insulin asp prt-insulin aspart (NOVOLOG MIX 70/30) 100 unit/mL (70-30) Subcutaneous Insulin Pen Inject 10 Units under the skin Twice daily   . ipratropium-albuterol 0.5 mg-3 mg(2.5 mg base)/3 mL Solution for Nebulization Take 3 mL by nebulization Twice per day as needed for Wheezing for up to 30 days   .  ipratropium-albuterol 0.5 mg-3 mg(2.5 mg base)/3 mL Solution for Nebulization Take 3 mL by nebulization Four times a day for 30 days   . lidocaine (XYLOCAINE) 2 % Mucous Membrane jelly Apply 5 mL topically One time for 1 dose   . meloxicam (MOBIC) 15 mg Oral Tablet Take 15 mg by mouth Once a day (Patient not taking: Reported on 03/25/2021)   . OXcarbazepine (TRILEPTAL) 600 mg Oral Tablet Take 600 mg by mouth Twice daily (Patient not taking: Reported on 03/25/2021)   . oxyCODONE (ROXICODONE) 5 mg Oral Tablet Take 1 Tablet (5 mg total) by mouth Every 6 hours as needed for Pain for up to 10 days   . prazosin (MINIPRESS) 5 mg Oral Capsule Take 5 mg by mouth Once a day (Patient not taking: Reported on 03/25/2021)   . SUMAtriptan (IMITREX) 50 mg Oral Tablet Take 50 mg by mouth Once, as needed for Migraine May repeat in 2 hours in needed (Patient not taking: Reported on 03/25/2021)   . topiramate (TOPAMAX) 50 mg Oral Tablet Take 50 mg by mouth Twice daily (Patient not taking: Reported on 03/25/2021)   . venlafaxine (EFFEXOR XR) 150 mg Oral Capsule, Sust. Release 24 hr Take 150 mg by mouth Once a day (Patient not taking: Reported on 03/25/2021)       Allergies:  Allergies   Allergen Reactions   . Beeswax      Allergic to [employer]   . Clindamycin    . Hydrocodone    . Other      Nutrasweet, sugar subs   . Sulfa (Sulfonamides)      HIVES   . Theophylline      ANXIETY       Family History:  Family Medical History:    None           Social History:  Social History     Tobacco Use   . Smoking status: Every Day     Packs/day: 0.50     Years: 15.00     Pack years: 7.50     Types: Cigarettes   . Smokeless tobacco: Never   Substance Use Topics   . Alcohol use: Not Currently     Comment: seldom     Smoking cessation counseling this appointment: .yes , no interest in smoking cessation    Review of Systems :    Constitutional: negative for fevers, chills,fatigue, and weight loss  Respiratory: negative for cough, wheezing  Cardiovascular:  negative for chest pressure/discomfort, negative claudication, negative lower extremity edema  Gastrointestinal: negative for dysphagia, nausea, vomiting, diarrhea, decrease in appetite   Integumentary:  negative for rash, changes in skin color, dryness; + skin lesion per wound chart  Musculoskeletal: negative for myalgias, muscle weakness, joint pain  Neurological: negative for dizziness, gait/coordination problems, positive paresthesia  All other systems negative.    Physical Exam:    BP (!) 144/90   Pulse (!) 104   Temp 36.3 C (97.3 F)   Resp 18   Ht 1.651 m ( )   Wt (!) 141 kg (310 lb)   BMI 51.59 kg/m        Constitutional: well developed, in good general health,  ambulatory appears angry, tearful, confrontational  Eyes: PERRL, conjunctiva non-icteric  HENT: mucous membranes moist  Respiratory: non-labored on room air  Cardiovascular: ++edema, ++ bilateral dorsal pedis pulses, absent cyanosis,   Neurologic: A&Ox3; grossly intact; sensation in feet is diminished  Musculoskeletal: Equal strength to bilateral upper/lower extremities, head normocephalic   Psychiatric: Normal affect; cooperative with exam  Integumentary: ,See progress note, - hemosiderin staining noted, +periwound erythema, -xerosis, -lipodermatosclerosis, yes improving s/s of infection; see detailed wound charting below:      Neurovascular Assessment:Noted above      WOUND ASSESSMENT     Nursing Notes:   Donnita Falls, RN  04/07/21 1610  Signed     04/07/21 0900   Wound (Non-Surgical) Right;Inner Thigh   Initial Date of Wound Assessment/Initial Time of Wound Assessment: 04/07/21 9604   Present on Hospital Admission: No  Wound Approximate Age at First Assessment (Weeks): 4 weeks  Primary Wound Type: (c) Other (comment)  Wound Number [facility]): 1  O...   LDAW Image    State of Healing Non-healing   Wound Thickness Full Thickness   WOUND SITE CLOSURE None   Periwound Hyperpigmentation;Induration;Maceration   Wound Edges Flat and Intact    Wound Bed unable to visualize wound base   Wound Length (cm) 0.6 cm   Wound Width (cm) 0.7 cm   Wound Depth (cm) 2 cm   Wound Volume (cm^3) 0.84 cm^3   Wound Surface Area (cm^2) 0.42 cm^2   Wound Undermining No   Wound Tunneling  No   Wound Drainage Amount Large   Exudate/Drainage Yellow   Wound Odor Absent   DRESSING STATUS Removed         [place], Melissa, RN  04/07/21 1004  Signed     04/07/21 0900   OTHER   Cleansed with saline   Primary dressing dakins;packing strip   Secondary dressing border   Location right upper thight         [place], Melissa, RN  04/07/21 1005  Signed     04/07/21 0900   OTHER   Cleansed with saline   Primary dressing dakins;packing strip   Secondary dressing border   Location right upper thight                                 Data / Labs / Imaging Studies:      Previous progress notes and lab results reviewed up to date in medical chart.     Labs: Following reviewed and discussed with patient  ALBUMIN   Date Value   03/25/2021 3.0 g/dL  (L)   54/09/8117 3.2 g/dL (L)     No results found for: PREALBUMIN  HEMOGLOBIN A1C (%)   Date Value   03/25/2021 11.5 (H)           Assessment / Plan:  ENCOUNTER DIAGNOSES     ICD-10-CM   1. Abscess of right thigh  L02.415         Off-loading:NA      Dressing:Dakin's packing      Compression Wraps:NA        - Continue prescribed medication regimen as directed by PCP. Medical optimization of diabetes with PCP.     - Counseled patient on importance of nutrition to support granulation tissue growth and wound     healing  --Instructed patient on foods high in Vitamin A, Vitamin C and protein      -Instructed patient if diabetic on the importance of tight glycemic control with a goal HgbA1C < 7.0    - RTC in 1 week or sooner if needed. May call with any additional questions/concerns prior to next appointment. Patient instructed to call or report to Emergency Department if fever develops, wound increases in size, wound drainage increases, pain is  uncontrolled, edema increases or erythema develops.      Vernell BarrierSusan Donda Friedli, MD 04/07/2021, 10:11    Wound Care    This note was partially generated using MModal Fluency Direct System.There maybe some incorrect words, spelling, and punctuation that were not noted in checking the note before saving.

## 2021-04-10 ENCOUNTER — Other Ambulatory Visit: Payer: Self-pay

## 2021-04-11 LAB — ANAEROBIC CULTURE: ANAEROBIC CULTURE: NO GROWTH

## 2021-04-11 LAB — WOUND, SUPERFICIAL/NON-STERILE SITE, AEROBIC CULTURE AND GRAM STAIN
GRAM STAIN: NONE SEEN
WOUND CULTURE: NO GROWTH

## 2021-04-13 ENCOUNTER — Ambulatory Visit (HOSPITAL_BASED_OUTPATIENT_CLINIC_OR_DEPARTMENT_OTHER): Payer: MEDICAID | Admitting: FAMILY PRACTICE

## 2021-04-13 ENCOUNTER — Encounter (HOSPITAL_BASED_OUTPATIENT_CLINIC_OR_DEPARTMENT_OTHER): Payer: Self-pay | Admitting: FAMILY PRACTICE

## 2021-04-13 ENCOUNTER — Other Ambulatory Visit: Payer: Self-pay

## 2021-04-13 VITALS — BP 142/84 | HR 84 | Temp 96.4°F | Resp 18

## 2021-04-13 DIAGNOSIS — F17210 Nicotine dependence, cigarettes, uncomplicated: Secondary | ICD-10-CM | POA: Insufficient documentation

## 2021-04-13 DIAGNOSIS — L02415 Cutaneous abscess of right lower limb: Secondary | ICD-10-CM

## 2021-04-13 MED ORDER — LIDOCAINE HCL 2 % MUCOSAL JELLY
5.0000 mL | Freq: Once | 0 refills | Status: AC
Start: 2021-04-13 — End: 2021-04-13

## 2021-04-13 NOTE — Progress Notes (Signed)
[facility]  [address]  [place] [address]  [phone]    Wound Care Progress Note      Date of Service: 04/13/2021  Yvette Schmidt, Yvette Schmidt, 46 y.o., White female  Date of Birth:  [birth year]  MRN: [id number]  PCP: Yvette Kennedy, MD  Referring provider: Leroy Kennedy, MD  Information Obtained from: patient    Reason for visit:   Chief Complaint   Patient presents with   . Wound       HPI:  Past medical history, past surgical history, social history and family history have been reviewed.  Yvette Schmidt is improving.  The induration around the wound is lessened.  She is packing the wound herself and reports less purulence, more blood tinged drainage.  No fevers.  Culture yielded no growth.    Social History     Tobacco Use   Smoking Status Every Day   . Packs/day: 0.50   . Years: 15.00   . Pack years: 7.50   . Types: Cigarettes   Smokeless Tobacco Never     Patient uses tobacco products: No, does not smoke.    Examination  Vitals:  Temperature: 35.8 C (96.4 F) (04/13/21 0800)  Heart Rate: 84 (04/13/21 0800)  Respiratory Rate: 18 (04/13/21 0800)  BP (Non-Invasive): (!) 142/84 (04/13/21 0800)   /       DEBRIDEMENT   Debridement not performed.     PROCEDURES  No procedures perfomed    Infection status:   No signs/symptoms of infection    Neuro/Vascular Assessment  No change    Nursing Notes:   Yvette Falls, RN  04/13/21 [id number]  Signed     04/13/21 0800   Wound (Non-Surgical) Right;Inner Thigh   Initial Date of Wound Assessment/Initial Time of Wound Assessment: 04/07/21 5465   Present on Hospital Admission: No  Wound Approximate Age at First Assessment (Weeks): 4 weeks  Primary Wound Type: (c) Other (comment)  Wound Number [facility]): 1  O...   LDAW Image    State of Healing Non-healing   Wound Thickness Full Thickness   WOUND SITE CLOSURE None   Periwound Hyperpigmentation   Wound Edges Flat and Intact   Wound Bed unable to visualize wound base   Wound Length (cm) 0.3 cm   Wound Width (cm)  0.5 cm   Wound Depth (cm) 1.5 cm   Wound Volume (cm^3) 0.225 cm^3   Wound Healing % 73   Wound Surface Area (cm^2) 0.15 cm^2   Wound Undermining No   Wound Tunneling  No   Wound Drainage Amount Moderate   Exudate/Drainage Yellow   Wound Odor Absent   DRESSING STATUS Removed             Nutritional Assessment:    Recent Labs     03/25/21  1332   ALBUMIN 3.0*     Addressed with patient.     Diabetes Assessment:  No results found for: GLUCPOCT, GLUIP]  Lab Results   Component Value Date    HA1C 11.5 (H) 03/25/2021     Continue current meds as per PCP or endocrinologist    Drugs affecting wound  NA    Social support:  Adequate    ASSESSMENT/PLAN    No diagnosis found.    Offloading status/Pressure Reduction:    NA    Edema control:    NA    F/U:  Follow up with physician in 1 week.  Yvette Barrier, MD 04/13/2021 09:13

## 2021-04-13 NOTE — Patient Instructions (Signed)
.Caring For Your Ulcers  Healthy skin is a natural barrier that prevents infection. A break in your skin makes it possible for germs to enter your body. Covering your wound will help to keep it clean and heal faster.    Your Nurse Will Show You:  ~How to take care of your dressing  ~When and how to change your dressing  ~What to watch for as your wound heals.    Call the Wound Care Center [phone] Immediately if You Notice:  ~Increased pain at the wound site  ~Excess drainage wets the dressing before it is time to change the dressing  ~Redness or swelling around or spreading away from the wound  ~Foul odor coming from the wound  ~Change in color or amount of drainage from the wound  ~Fever and chills  ~Nausea and vomiting    Changing Your Dressing:  Gather the supplies you will need for your dressing change:    -Trash bag    -Wound Cleanser    -Hand washing supplies    Tape or gauze wrap to hold your dressing in place  2. Wash your hand with soap and water. Put on gloves if available.  3. Prepare a clean working area.  4. Take your dressing off carefully.Throw away the old dressing in a trash bag. Try to keep the wound clean.  5. Look at your wound closely. Look for any foul odors; change in color or amount of drainage; redness or swelling around the wound; or redness spreading away from the wound.  6. Wash your hands with soap and water.  7. Clean your wound using the normal saline.  8. Put on a new dressing.  9. Put the plastic bag with the old dressing in another plastic bag and put directly in the trash.  10. Wash your hands one last time with soap and water.    General Information  ~Change your wound dressing as directed by the Wound Care Center staff or if it gets dirty or wet.  ~Do not put anything in your open wound that is not prescribed by your physician.  ~Change your dressing as close to hand washing facilities as possible.    Storing Your Dressing Supplies  ~Always keep your clean dressings in a  storage container that has a lid and is kept off of the floor away from children and pets. A plastic container with a lid or clean large plastic bag that can be tied shut will be best.  ~When preparing a clean working area, use a paper towel or other clean cover to put your supplies on. DO NOT put supplies directly on a table or bed.    Helping Your Wound Heal  ~Keep the outside of your dressing clean and dry. If it becomes soiled or wet, change it as soon as possible.  ~Keep your body clean. Bathe daily with soap and water as allowed by your wound care provider. Change your dressing after your shower or bath.  ~Eat a well balanced diet. Follow special dietary or fluid restrictions that your doctor has discussed.   ~Examine your wound carefully every time you remove your dressing. Immediately report any changes to your Wound Care Center staff.    The Proper Way to Wash Hands  ~Remove your jewelry before washing your hands so that the spaces between your fingers can be cleaned and dried.  ~Adjust the water temperature and lather your hands with soap.  ~Rub your hands together, cleaning the front and   back of each hand up to the wrist and between all fingers for 20 seconds (Sing the Happy Birthday song 2 times or the chorus of Yankee Doodle is about 20 seconds).  ~Rinse well.  ~If possible turn the faucets off with a paper towel or towel.

## 2021-04-13 NOTE — Nursing Note (Signed)
04/13/21 0900   OTHER   Cleansed with saline   Primary dressing dakins;packing strip   Secondary dressing border   Location right upper thigh

## 2021-04-13 NOTE — Nursing Note (Signed)
04/13/21 0800   Wound (Non-Surgical) Right;Inner Thigh   Initial Date of Wound Assessment/Initial Time of Wound Assessment: 04/07/21 [id number]   Present on Hospital Admission: No  Wound Approximate Age at First Assessment (Weeks): 4 weeks  Primary Wound Type: (c) Other (comment)  Wound Number [facility]): 1  O...   LDAW Image    State of Healing Non-healing   Wound Thickness Full Thickness   WOUND SITE CLOSURE None   Periwound Hyperpigmentation   Wound Edges Flat and Intact   Wound Bed unable to visualize wound base   Wound Length (cm) 0.3 cm   Wound Width (cm) 0.5 cm   Wound Depth (cm) 1.5 cm   Wound Volume (cm^3) 0.225 cm^3   Wound Healing % 73   Wound Surface Area (cm^2) 0.15 cm^2   Wound Undermining No   Wound Tunneling  No   Wound Drainage Amount Moderate   Exudate/Drainage Yellow   Wound Odor Absent   DRESSING STATUS Removed

## 2021-04-23 ENCOUNTER — Encounter (HOSPITAL_BASED_OUTPATIENT_CLINIC_OR_DEPARTMENT_OTHER): Payer: Self-pay | Admitting: FAMILY PRACTICE

## 2021-04-23 ENCOUNTER — Ambulatory Visit: Payer: MEDICAID | Attending: FAMILY PRACTICE | Admitting: FAMILY PRACTICE

## 2021-04-23 ENCOUNTER — Other Ambulatory Visit: Payer: Self-pay

## 2021-04-23 VITALS — BP 118/60 | HR 84 | Temp 97.5°F | Resp 18

## 2021-04-23 DIAGNOSIS — L02415 Cutaneous abscess of right lower limb: Secondary | ICD-10-CM

## 2021-04-23 MED ORDER — LIDOCAINE HCL 2 % MUCOSAL JELLY
5.0000 mL | Freq: Once | 0 refills | Status: AC
Start: 2021-04-23 — End: 2021-05-21

## 2021-04-23 MED ORDER — TRAMADOL 50 MG TABLET
1.0000 | ORAL_TABLET | Freq: Every day | ORAL | 0 refills | Status: AC
Start: 2021-04-23 — End: 2021-05-21

## 2021-04-23 NOTE — Patient Instructions (Signed)
.Caring For Your Ulcers  Healthy skin is a natural barrier that prevents infection. A break in your skin makes it possible for germs to enter your body. Covering your wound will help to keep it clean and heal faster.    Your Nurse Will Show You:  ~How to take care of your dressing  ~When and how to change your dressing  ~What to watch for as your wound heals.    Call the Wound Care Center [phone] Immediately if You Notice:  ~Increased pain at the wound site  ~Excess drainage wets the dressing before it is time to change the dressing  ~Redness or swelling around or spreading away from the wound  ~Foul odor coming from the wound  ~Change in color or amount of drainage from the wound  ~Fever and chills  ~Nausea and vomiting    Changing Your Dressing:  Gather the supplies you will need for your dressing change:    -Trash bag    -Wound Cleanser    -Hand washing supplies    Tape or gauze wrap to hold your dressing in place  2. Wash your hand with soap and water. Put on gloves if available.  3. Prepare a clean working area.  4. Take your dressing off carefully.Throw away the old dressing in a trash bag. Try to keep the wound clean.  5. Look at your wound closely. Look for any foul odors; change in color or amount of drainage; redness or swelling around the wound; or redness spreading away from the wound.  6. Wash your hands with soap and water.  7. Clean your wound using the normal saline.  8. Put on a new dressing.  9. Put the plastic bag with the old dressing in another plastic bag and put directly in the trash.  10. Wash your hands one last time with soap and water.    General Information  ~Change your wound dressing as directed by the Wound Care Center staff or if it gets dirty or wet.  ~Do not put anything in your open wound that is not prescribed by your physician.  ~Change your dressing as close to hand washing facilities as possible.    Storing Your Dressing Supplies  ~Always keep your clean dressings in a  storage container that has a lid and is kept off of the floor away from children and pets. A plastic container with a lid or clean large plastic bag that can be tied shut will be best.  ~When preparing a clean working area, use a paper towel or other clean cover to put your supplies on. DO NOT put supplies directly on a table or bed.    Helping Your Wound Heal  ~Keep the outside of your dressing clean and dry. If it becomes soiled or wet, change it as soon as possible.  ~Keep your body clean. Bathe daily with soap and water as allowed by your wound care provider. Change your dressing after your shower or bath.  ~Eat a well balanced diet. Follow special dietary or fluid restrictions that your doctor has discussed.   ~Examine your wound carefully every time you remove your dressing. Immediately report any changes to your Wound Care Center staff.    The Proper Way to Wash Hands  ~Remove your jewelry before washing your hands so that the spaces between your fingers can be cleaned and dried.  ~Adjust the water temperature and lather your hands with soap.  ~Rub your hands together, cleaning the front and   back of each hand up to the wrist and between all fingers for 20 seconds (Sing the Happy Birthday song 2 times or the chorus of Yankee Doodle is about 20 seconds).  ~Rinse well.  ~If possible turn the faucets off with a paper towel or towel.

## 2021-04-23 NOTE — Progress Notes (Signed)
[facility]  [address]  [place] [address]  [phone]    Wound Care Progress Note      Date of Service: 04/23/2021  Yvette, Schmidt, 46 y.o., White female  Date of Birth:  Aug 30, 1975  MRN: [id number]  PCP: Leroy Kennedy, MD  Referring provider: Leroy Kennedy, MD  Information Obtained from: patient    Reason for visit:   Chief Complaint   Patient presents with   . Wound       HPI:  Past medical history, past surgical history, social history and family history have been reviewed.    Social History     Tobacco Use   Smoking Status Every Day   . Packs/day: 0.50   . Years: 15.00   . Pack years: 7.50   . Types: Cigarettes   Smokeless Tobacco Never     Patient uses tobacco products: No, does not smoke.    Examination  Vitals:  Temperature: 36.4 C (97.5 F) (04/23/21 0853)  Heart Rate: 84 (04/23/21 0853)  Respiratory Rate: 18 (04/23/21 0853)  BP (Non-Invasive): 118/60 (04/23/21 9924)   /       DEBRIDEMENT   Debridement not performed.     PROCEDURES  No procedures perfomed    Infection status:   No signs/symptoms of infection    Neuro/Vascular Assessment  No change    Nursing Notes:   Anabel Halon, RN  04/23/21 2683  Signed     04/23/21 0800   Wound (Non-Surgical) Right;Inner Thigh   Initial Date of Wound Assessment/Initial Time of Wound Assessment: 04/07/21 4196   Present on Hospital Admission: No  Wound Approximate Age at First Assessment (Weeks): 4 weeks  Primary Wound Type: (c) Other (comment)  Wound Number [facility]): 1  O...   LDAW Image    State of Healing Non-healing   Wound Thickness Full Thickness   WOUND SITE CLOSURE None   Periwound Scarring   Wound Edges Flat and Intact   Wound Bed unable to visualize wound base   Wound Length (cm) 0.3 cm   Wound Width (cm) 0.3 cm   Wound Depth (cm) 1.2 cm   Wound Volume (cm^3) 0.108 cm^3   Wound Healing % 87   Wound Surface Area (cm^2) 0.09 cm^2   Wound Undermining No   Wound Tunneling  No   Wound Drainage Amount Moderate    Exudate/Drainage Serosanguineous;Green   Wound Odor Absent   DRESSING STATUS Removed             Nutritional Assessment:    Recent Labs     03/25/21  1332   ALBUMIN 3.0*     Addressed with patient.     Diabetes Assessment:  No results found for: GLUCPOCT, GLUIP]  Lab Results   Component Value Date    HA1C 11.5 (H) 03/25/2021     Continue current meds as per PCP or endocrinologist    Drugs affecting wound  NA    Social support:  Adequate    ASSESSMENT/PLAN    No diagnosis found.    Offloading status/Pressure Reduction:    NA    Edema control:    NA    F/U:  Follow up with physician in 3 week.      Vernell Barrier, MD 04/23/2021 09:17

## 2021-04-23 NOTE — Nursing Note (Signed)
04/23/21 0800   Wound (Non-Surgical) Right;Inner Thigh   Initial Date of Wound Assessment/Initial Time of Wound Assessment: 04/07/21 1610   Present on Hospital Admission: No  Wound Approximate Age at First Assessment (Weeks): 4 weeks  Primary Wound Type: (c) Other (comment)  Wound Number [facility]): 1  O...   LDAW Image    State of Healing Non-healing   Wound Thickness Full Thickness   WOUND SITE CLOSURE None   Periwound Scarring   Wound Edges Flat and Intact   Wound Bed unable to visualize wound base   Wound Length (cm) 0.3 cm   Wound Width (cm) 0.3 cm   Wound Depth (cm) 1.2 cm   Wound Volume (cm^3) 0.108 cm^3   Wound Healing % 87   Wound Surface Area (cm^2) 0.09 cm^2   Wound Undermining No   Wound Tunneling  No   Wound Drainage Amount Moderate   Exudate/Drainage Serosanguineous;Green   Wound Odor Absent   DRESSING STATUS Removed

## 2021-04-23 NOTE — Nursing Note (Signed)
04/23/21 0900   OTHER   Cleansed with saline   Primary dressing dakins;packing strip   Secondary dressing border   Location right upper thigh

## 2021-04-27 ENCOUNTER — Other Ambulatory Visit (INDEPENDENT_AMBULATORY_CARE_PROVIDER_SITE_OTHER): Payer: Self-pay | Admitting: Internal Medicine

## 2021-04-27 ENCOUNTER — Ambulatory Visit (HOSPITAL_BASED_OUTPATIENT_CLINIC_OR_DEPARTMENT_OTHER): Payer: MEDICAID | Admitting: FAMILY PRACTICE

## 2021-04-28 ENCOUNTER — Other Ambulatory Visit (INDEPENDENT_AMBULATORY_CARE_PROVIDER_SITE_OTHER): Payer: Self-pay | Admitting: Internal Medicine

## 2021-04-28 MED ORDER — BLOOD SUGAR DIAGNOSTIC STRIPS
1.0000 | ORAL_STRIP | Freq: Four times a day (QID) | 1 refills | Status: AC | PRN
Start: 2021-04-28 — End: 2021-05-28

## 2021-05-14 ENCOUNTER — Ambulatory Visit (HOSPITAL_BASED_OUTPATIENT_CLINIC_OR_DEPARTMENT_OTHER): Payer: MEDICAID | Admitting: FAMILY PRACTICE

## 2021-05-21 ENCOUNTER — Encounter (HOSPITAL_BASED_OUTPATIENT_CLINIC_OR_DEPARTMENT_OTHER): Payer: Self-pay | Admitting: FAMILY PRACTICE

## 2021-05-21 ENCOUNTER — Other Ambulatory Visit: Payer: Self-pay

## 2021-05-21 ENCOUNTER — Ambulatory Visit: Payer: MEDICAID | Attending: FAMILY PRACTICE | Admitting: FAMILY PRACTICE

## 2021-05-21 VITALS — BP 142/86 | HR 64 | Temp 97.6°F | Resp 18

## 2021-05-21 DIAGNOSIS — Z872 Personal history of diseases of the skin and subcutaneous tissue: Secondary | ICD-10-CM

## 2021-05-21 DIAGNOSIS — F17210 Nicotine dependence, cigarettes, uncomplicated: Secondary | ICD-10-CM | POA: Insufficient documentation

## 2021-05-21 DIAGNOSIS — L02415 Cutaneous abscess of right lower limb: Secondary | ICD-10-CM | POA: Insufficient documentation

## 2021-05-21 DIAGNOSIS — Z09 Encounter for follow-up examination after completed treatment for conditions other than malignant neoplasm: Secondary | ICD-10-CM

## 2021-05-31 ENCOUNTER — Other Ambulatory Visit (INDEPENDENT_AMBULATORY_CARE_PROVIDER_SITE_OTHER): Payer: Self-pay | Admitting: Internal Medicine

## 2021-05-31 MED ORDER — GABAPENTIN 300 MG CAPSULE
300.0000 mg | ORAL_CAPSULE | Freq: Three times a day (TID) | ORAL | 0 refills | Status: DC
Start: 2021-05-31 — End: 2021-07-02

## 2021-05-31 MED ORDER — LANCETS
1.0000 | Freq: Four times a day (QID) | 3 refills | Status: AC
Start: 2021-05-31 — End: 2021-06-30

## 2021-06-03 ENCOUNTER — Telehealth (INDEPENDENT_AMBULATORY_CARE_PROVIDER_SITE_OTHER): Payer: MEDICAID

## 2021-06-03 ENCOUNTER — Ambulatory Visit (HOSPITAL_BASED_OUTPATIENT_CLINIC_OR_DEPARTMENT_OTHER): Payer: MEDICAID | Admitting: FAMILY PRACTICE

## 2021-06-03 ENCOUNTER — Other Ambulatory Visit: Payer: Self-pay

## 2021-06-03 VITALS — BP 142/98 | HR 118 | Temp 96.7°F | Ht 65.0 in | Wt 308.0 lb

## 2021-06-03 DIAGNOSIS — F411 Generalized anxiety disorder: Secondary | ICD-10-CM

## 2021-06-03 DIAGNOSIS — E1142 Type 2 diabetes mellitus with diabetic polyneuropathy: Secondary | ICD-10-CM

## 2021-06-03 DIAGNOSIS — L0291 Cutaneous abscess, unspecified: Secondary | ICD-10-CM

## 2021-06-03 DIAGNOSIS — G4700 Insomnia, unspecified: Secondary | ICD-10-CM

## 2021-06-03 MED ORDER — AMITRIPTYLINE 25 MG TABLET
25.0000 mg | ORAL_TABLET | Freq: Every evening | ORAL | 0 refills | Status: DC
Start: 2021-06-03 — End: 2021-07-02

## 2021-06-03 MED ORDER — SEMAGLUTIDE 0.25 MG OR 0.5 MG (2 MG/3 ML) SUBCUTANEOUS PEN INJECTOR
PEN_INJECTOR | SUBCUTANEOUS | 0 refills | Status: DC
Start: 2021-06-03 — End: 2021-07-02

## 2021-06-03 MED ORDER — FREESTYLE LIBRE 2 SENSOR KIT
1.0000 | PACK | 1 refills | Status: AC
Start: 2021-06-03 — End: 2021-07-03

## 2021-06-03 MED ORDER — SUMATRIPTAN 50 MG TABLET
50.0000 mg | ORAL_TABLET | Freq: Once | ORAL | 0 refills | Status: AC | PRN
Start: 2021-06-03 — End: 2021-07-03

## 2021-06-03 MED ORDER — FREESTYLE LIBRE 2 READER
1.0000 | Freq: Four times a day (QID) | 0 refills | Status: DC
Start: 2021-06-03 — End: 2022-03-14

## 2021-06-03 NOTE — Nursing Note (Signed)
Patient present to follow up. Patient has been following wound care for her abscess. She was released from there and told to follow up with her primary care doctor. Patient is also concerned about her increased anxiety. Patient reports she cries all night long. Patient is also concerned about her past diagnosis of fibromyalgia. Patient reports tingling and burning in her hands and feet. Pain in her back and up her spine while having a bowl movement. Patient is requesting a referral to rheumatology and orthopedics. Patient concerned about her fatty liver swelling again.     [place], [place]  06/03/2021, 09:34

## 2021-06-05 DIAGNOSIS — F411 Generalized anxiety disorder: Secondary | ICD-10-CM | POA: Insufficient documentation

## 2021-06-05 DIAGNOSIS — G4700 Insomnia, unspecified: Secondary | ICD-10-CM | POA: Insufficient documentation

## 2021-06-05 NOTE — Progress Notes (Cosign Needed)
[place] [facility]    Progress Note    Name: Yvette Schmidt MRN:  [id number]   Date: 06/03/2021 Age: 46 y.o.       Chief Complaint: Follow Up on her wound and diabetes with depressed mood and insomnia     Subjective:   This is Yvette Schmidt, a 46 year old female patient.   Hx of T2DM, COPD and Heradenitis Suppurativa  She's here in the office as a follow up on her right thigh abscess  she's also here as a follow up on her blood sugars.   She's also lately very depressed and anxious and unable to sleep at night.   Deny any other concerns at this stage.     As per the records, she was seen recently seen by the wound specialist and her wound/Abscess has lately been fine. According to the patient, the swelling in her leg is gone and there is no more discharge/Drainage.   Denies any fever or chills.   Denies any chest pain or shortness of breath.     Regarding her diabetes, her last A1C was done in mid of March. That was 11 and she's currently using Novolog Mix 70/30, 10 units BID. She do monitor levels at home and according to the patient, the home levels are coming between 200 to 300 at times.   She's also complaining of occasional numbness and tingling in her feet but for that she's using Gavapentin 3100m three times a day. She's also lately been very anxious.     Regarding her anxiety, she's on Wistaril 50 mg three times a day that she was using previously. She's currently not on anything else for anxiety. She's been very anxious. She worry about different things. She feel like crying at times. She's unable to sleep at night and denies any suicidal or homicidal ideation.     History of COPD with nicotine dependence and she's using inhalers at home. According to the patient, she was previously using CPAP but currently she's not using that. No other concerns       Patient Active Problem List    Diagnosis   . Stress test  03/17/16--@ [facility]-EF 65%. Normal Lexiscan MPS and stress.   . 2-D  echo  03/17/16 mild MR and TR with EF of 60 to 65%   . Depression   . Headache   . Migraine Headaches   . GERD   . Sleep Apnea   . Asthma   . Diabetes Mellitus, Type II          Past Medical History:   Diagnosis Date   . Asthma    . COPD (chronic obstructive pulmonary disease) (CMS HCC)    . Diabetes mellitus, type 2 (CMS HCC)    . GERD (gastroesophageal reflux disease)    . Hypertension              Past Surgical History:   Procedure Laterality Date   . CORONARY ARTERY ANGIOPLASTY     . HX HERNIA REPAIR     . PB COLONOSCOPY,DIAGNOSTIC       Objective :  BP (!) 142/98   Pulse (!) 118   Temp 35.9 C (96.7 F)   Ht 1.651 m ('5\' 5"'$ )   Wt (!) 140 kg (308 lb)   SpO2 91%   BMI 51.25 kg/m     Physical examination:  Examination by my assistant showed  Gen: not in acute distress, sitting comfortably in the  chair  Resp; Clear chest, no wheeze or crackles . Equal Bs   SAY:TKZSWF S1 and s2  No legs swelling   GI: +BS, nontender to palpation, no masses, no HSM  DERM: skin warm and dry  EXT: no cyanosis/clubbing/edema, the wound is dry and closed now, no abscess  NEURO: Alert and oriented   PSYCH: judgment/insight intact, anxious mood and sad effect    Data reviewed:    Current Outpatient Medications   Medication Sig   . ACCU-CHEK GUIDE GLUCOSE METER Does not apply Misc    . albuterol sulfate (PROVENTIL OR VENTOLIN OR PROAIR) 90 mcg/actuation Inhalation oral inhaler Take 1-2 Puffs by inhalation Every 6 hours as needed   . amitriptyline (ELAVIL) 25 mg Oral Tablet Take 1 Tablet (25 mg total) by mouth Every night for 30 days   . busPIRone (BUSPAR) 15 mg Oral Tablet Take 15 mg by mouth Three times a day   . cholecalciferol, Vitamin D3, 125 mcg (5,000 unit) Oral Tablet Take 1 Tab by mouth Twice daily   . cloNIDine HCl (CATAPRES) 0.1 mg Oral Tablet Take 0.1 mg by mouth Twice daily   . EPINEPHrine (EPIPEN) 0.3 mg/0.3 mL Injection Auto-Injector 0.3 mg by Intramuscular route Once, as needed   .  EPINEPHrine 0.3 mg/0.3 mL Injection Auto-Injector Inject 0.3 mL (0.3 mg total) into the muscle Once, as needed for up to 1 dose   . flash glucose scanning reader (FREESTYLE LIBRE 2 READER) Does not apply Misc 1 Device Four times a day   . flash glucose sensor (FREESTYLE LIBRE 2 SENSOR) Does not apply Kit 1 Kit Every 14 days for 30 days   . fluticasone propionate (FLONASE) 50 mcg/actuation Nasal Spray, Suspension Administer 2 Sprays into each nostril Twice daily   . gabapentin (NEURONTIN) 300 mg Oral Capsule Take 1 Capsule (300 mg total) by mouth Three times a day for 30 days   . hydrOXYzine pamoate (VISTARIL) 50 mg Oral Capsule Take 50 mg by mouth Three times a day as needed for Itching   . insulin asp prt-insulin aspart (NOVOLOG MIX 70/30) 100 unit/mL (70-30) Subcutaneous Insulin Pen Inject 10 Units under the skin Twice daily   . ipratropium (ATROVENT) 0.02 % Inhalation Solution    . ipratropium-albuterol 0.5 mg-3 mg(2.5 mg base)/3 mL Solution for Nebulization Take 3 mL by nebulization Twice per day as needed for Wheezing for up to 30 days   . ipratropium-albuterol 0.5 mg-3 mg(2.5 mg base)/3 mL Solution for Nebulization Take 3 mL by nebulization Four times a day for 30 days   . Lancets Misc 1 Each Four times a day - before meals and bedtime for 30 days   . meloxicam (MOBIC) 15 mg Oral Tablet Take 15 mg by mouth Once a day   . OXcarbazepine (TRILEPTAL) 600 mg Oral Tablet Take 600 mg by mouth Twice daily   . prazosin (MINIPRESS) 5 mg Oral Capsule Take 5 mg by mouth Once a day   . semaglutide (OZEMPIC) 0.25 mg or 0.5 mg (2 mg/3 mL) Subcutaneous Pen Injector Inject 0.25 mg under the skin Every 7 days for 28 days, THEN 0.5 mg Every 7 days for 14 days.   . SUMAtriptan (IMITREX) 50 mg Oral Tablet Take 50 mg by mouth Once, as needed for Migraine May repeat in 2 hours in needed   . SUMAtriptan (IMITREX) 50 mg Oral Tablet Take 1 Tablet (50 mg total) by mouth Once, as needed for Migraine for up to 30 days May repeat in  2  hours in needed   . SURE COMFORT PEN NEEDLE 32 gauge x 1/4" Does not apply Needle    . topiramate (TOPAMAX) 50 mg Oral Tablet Take 50 mg by mouth Twice daily   . venlafaxine (EFFEXOR XR) 150 mg Oral Capsule, Sust. Release 24 hr Take 150 mg by mouth Once a day     Assessment/Plan  Problem List Items Addressed This Visit        Neurologic    Insomnia       Endocrine    T2DM (type 2 diabetes mellitus) (CMS HCC) - Primary       Other    Abscess   Other Visit Diagnoses     GAD (generalized anxiety disorder)            GAD-7 Questionnaire 06/03/2021   Feeling nervous,anxious,on edge 2   Not being able to stop or control worrying 3   Worrying too much about different things 3   Trouble relaxing 2   Being so restless that it is hard to sit still 2   Becoming easily annoyed or irritable 2   Feeling afraid as if something awful might happen 2   How difficult have these problems made it for you to work, take care of things at home, or get along with other people? Very difficult   Gad-7 Score Total 16   Interpretation 15 or more, severe anxiety       Abscess right thigh:  The wound is currently better.   There is no swelling.   There is no discharge and the wound is dry.   As per records, she was MRSA positive regarding the pus culture and sensitivity.   She has finished the course of doxycycline and she's currently seeing the wound care specailist.    Morbid obesity with Insulin resistance and  type 2 diabetes mellitus. Complicated by the peripheral neuropathy:  Patient is counselled under condition.   A1c target is less than 7.   BMI target is less than 25.   Patient last A1c which was done in mid of March was 11.   And patient BMI is 51.   Patient is counseled under condition.   Recommended and prescribed ProLibre.   Continue with Humulin 70/30, 10 units per day.   Injection of Ozempic is prescribed. 0.5 mg S/C weekly.  She will need an eye examination.   Foot examination is done today in the office. Showed normal pulses,  normal sensation. And there is no callus or wound on the legs.  Foot care counseling is done  Recommended and counseled on diabetic diet  Follow-up in 2-4 weeks with the pro libre readings       GAD with depressed mood  and insomnia.   Patient is counseled under condition.   GAD- 7 score is done today in the office and was 15   Started on amitriptyline 25 mg.   -Self care discussed  -Stable  -Relaxation Techniques discussed with patient  -[place] BOP reviewed and appropriate  Patient will be seen with the psychiatrist next week for counseling     Philis Fendt, MA   I am scribing for, and in the presence of, Dr. Miguel Aschoff for services provided on 06/03/2021.  Philis Fendt, MA

## 2021-06-24 ENCOUNTER — Encounter (INDEPENDENT_AMBULATORY_CARE_PROVIDER_SITE_OTHER): Payer: Self-pay

## 2021-07-02 ENCOUNTER — Encounter (INDEPENDENT_AMBULATORY_CARE_PROVIDER_SITE_OTHER): Payer: MEDICAID

## 2021-07-02 ENCOUNTER — Encounter (INDEPENDENT_AMBULATORY_CARE_PROVIDER_SITE_OTHER): Payer: MEDICAID | Admitting: Psychiatry

## 2021-07-02 ENCOUNTER — Other Ambulatory Visit (INDEPENDENT_AMBULATORY_CARE_PROVIDER_SITE_OTHER): Payer: Self-pay | Admitting: Internal Medicine

## 2021-07-02 MED ORDER — AMITRIPTYLINE 25 MG TABLET
25.0000 mg | ORAL_TABLET | Freq: Every evening | ORAL | 0 refills | Status: DC
Start: 2021-07-02 — End: 2021-08-02

## 2021-07-02 MED ORDER — ALBUTEROL SULFATE HFA 90 MCG/ACTUATION AEROSOL INHALER
1.0000 | INHALATION_SPRAY | Freq: Four times a day (QID) | RESPIRATORY_TRACT | 0 refills | Status: DC | PRN
Start: 2021-07-02 — End: 2021-08-02

## 2021-07-02 MED ORDER — BLOOD SUGAR DIAGNOSTIC STRIPS
1.0000 | ORAL_STRIP | Freq: Four times a day (QID) | 0 refills | Status: AC
Start: 2021-07-02 — End: 2021-08-01

## 2021-07-02 MED ORDER — FLUTICASONE PROPIONATE 50 MCG/ACTUATION NASAL SPRAY,SUSPENSION
2.0000 | Freq: Two times a day (BID) | NASAL | 0 refills | Status: DC
Start: 2021-07-02 — End: 2021-08-02

## 2021-07-02 MED ORDER — GABAPENTIN 300 MG CAPSULE
300.0000 mg | ORAL_CAPSULE | Freq: Three times a day (TID) | ORAL | 0 refills | Status: DC
Start: 2021-07-02 — End: 2021-08-02

## 2021-07-02 MED ORDER — INSULIN ASPAR PROT-INSULIN ASPART 100 UNIT/ML (70-30) SUBCUTANEOUS PEN
10.0000 [IU] | PEN_INJECTOR | Freq: Two times a day (BID) | SUBCUTANEOUS | 0 refills | Status: DC
Start: 2021-07-02 — End: 2022-04-14

## 2021-07-02 MED ORDER — SEMAGLUTIDE 0.25 MG OR 0.5 MG (2 MG/3 ML) SUBCUTANEOUS PEN INJECTOR
0.5000 mg | PEN_INJECTOR | SUBCUTANEOUS | 0 refills | Status: DC
Start: 2021-07-02 — End: 2021-12-07

## 2021-08-02 ENCOUNTER — Other Ambulatory Visit (INDEPENDENT_AMBULATORY_CARE_PROVIDER_SITE_OTHER): Payer: Self-pay | Admitting: Internal Medicine

## 2021-08-03 ENCOUNTER — Other Ambulatory Visit (INDEPENDENT_AMBULATORY_CARE_PROVIDER_SITE_OTHER): Payer: Self-pay | Admitting: Internal Medicine

## 2021-08-03 MED ORDER — GABAPENTIN 300 MG CAPSULE
300.0000 mg | ORAL_CAPSULE | Freq: Three times a day (TID) | ORAL | 0 refills | Status: DC
Start: 2021-08-03 — End: 2021-08-05

## 2021-08-05 ENCOUNTER — Other Ambulatory Visit (INDEPENDENT_AMBULATORY_CARE_PROVIDER_SITE_OTHER): Payer: Self-pay | Admitting: Psychiatry

## 2021-08-05 MED ORDER — GABAPENTIN 300 MG CAPSULE
300.0000 mg | ORAL_CAPSULE | Freq: Three times a day (TID) | ORAL | 0 refills | Status: DC
Start: 2021-08-05 — End: 2021-10-04

## 2021-10-04 ENCOUNTER — Other Ambulatory Visit (INDEPENDENT_AMBULATORY_CARE_PROVIDER_SITE_OTHER): Payer: Self-pay | Admitting: Psychiatry

## 2021-11-04 ENCOUNTER — Other Ambulatory Visit (INDEPENDENT_AMBULATORY_CARE_PROVIDER_SITE_OTHER): Payer: Self-pay | Admitting: Internal Medicine

## 2021-11-22 ENCOUNTER — Other Ambulatory Visit (INDEPENDENT_AMBULATORY_CARE_PROVIDER_SITE_OTHER): Payer: Self-pay | Admitting: Internal Medicine

## 2021-12-07 ENCOUNTER — Other Ambulatory Visit (INDEPENDENT_AMBULATORY_CARE_PROVIDER_SITE_OTHER): Payer: Self-pay | Admitting: Internal Medicine

## 2022-01-03 ENCOUNTER — Other Ambulatory Visit (INDEPENDENT_AMBULATORY_CARE_PROVIDER_SITE_OTHER): Payer: Self-pay | Admitting: Internal Medicine

## 2022-02-02 ENCOUNTER — Other Ambulatory Visit (INDEPENDENT_AMBULATORY_CARE_PROVIDER_SITE_OTHER): Payer: Self-pay | Admitting: Internal Medicine

## 2022-02-04 ENCOUNTER — Other Ambulatory Visit (INDEPENDENT_AMBULATORY_CARE_PROVIDER_SITE_OTHER): Payer: Self-pay | Admitting: Internal Medicine

## 2022-02-22 ENCOUNTER — Other Ambulatory Visit (INDEPENDENT_AMBULATORY_CARE_PROVIDER_SITE_OTHER): Payer: Self-pay | Admitting: Internal Medicine

## 2022-03-10 ENCOUNTER — Other Ambulatory Visit (INDEPENDENT_AMBULATORY_CARE_PROVIDER_SITE_OTHER): Payer: Self-pay | Admitting: Internal Medicine

## 2022-03-14 ENCOUNTER — Other Ambulatory Visit (INDEPENDENT_AMBULATORY_CARE_PROVIDER_SITE_OTHER): Payer: Self-pay | Admitting: Internal Medicine

## 2022-03-14 MED ORDER — GABAPENTIN 300 MG CAPSULE
300.0000 mg | ORAL_CAPSULE | Freq: Three times a day (TID) | ORAL | 0 refills | Status: DC
Start: 2022-03-14 — End: 2022-04-14

## 2022-03-14 MED ORDER — FLUTICASONE PROPIONATE 50 MCG/ACTUATION NASAL SPRAY,SUSPENSION
2.0000 | Freq: Two times a day (BID) | NASAL | 1 refills | Status: DC
Start: 2022-03-14 — End: 2022-04-14

## 2022-03-14 MED ORDER — ALBUTEROL SULFATE HFA 90 MCG/ACTUATION AEROSOL INHALER
INHALATION_SPRAY | RESPIRATORY_TRACT | 0 refills | Status: DC
Start: 2022-03-14 — End: 2022-04-14

## 2022-04-14 ENCOUNTER — Other Ambulatory Visit (INDEPENDENT_AMBULATORY_CARE_PROVIDER_SITE_OTHER): Payer: Self-pay | Admitting: Internal Medicine

## 2022-04-14 ENCOUNTER — Other Ambulatory Visit: Payer: MEDICAID | Attending: Internal Medicine | Admitting: Internal Medicine

## 2022-04-14 ENCOUNTER — Other Ambulatory Visit: Payer: Self-pay

## 2022-04-14 ENCOUNTER — Ambulatory Visit (INDEPENDENT_AMBULATORY_CARE_PROVIDER_SITE_OTHER): Payer: MEDICAID | Admitting: Internal Medicine

## 2022-04-14 ENCOUNTER — Encounter (INDEPENDENT_AMBULATORY_CARE_PROVIDER_SITE_OTHER): Payer: Self-pay | Admitting: Internal Medicine

## 2022-04-14 VITALS — BP 126/88 | HR 91 | Wt 284.0 lb

## 2022-04-14 DIAGNOSIS — E119 Type 2 diabetes mellitus without complications: Secondary | ICD-10-CM | POA: Insufficient documentation

## 2022-04-14 LAB — COMPREHENSIVE METABOLIC PANEL, NON-FASTING
ALBUMIN: 3.4 g/dL — ABNORMAL LOW (ref 3.5–5.0)
ALKALINE PHOSPHATASE: 100 U/L (ref 40–110)
ALT (SGPT): 11 U/L (ref 8–22)
ANION GAP: 12 mmol/L (ref 4–13)
AST (SGOT): 17 U/L (ref 8–45)
BILIRUBIN TOTAL: 0.5 mg/dL (ref 0.3–1.3)
BUN/CREA RATIO: 14 (ref 6–22)
BUN: 10 mg/dL (ref 8–25)
CALCIUM: 8.9 mg/dL (ref 8.6–10.2)
CHLORIDE: 100 mmol/L (ref 96–111)
CO2 TOTAL: 19 mmol/L — ABNORMAL LOW (ref 22–30)
CREATININE: 0.72 mg/dL (ref 0.60–1.05)
ESTIMATED GFR - FEMALE: >90 mL/min/BSA (ref >=60)
GLUCOSE: 141 mg/dL — ABNORMAL HIGH (ref 65–125)
POTASSIUM: 4.1 mmol/L (ref 3.5–5.1)
PROTEIN TOTAL: 7.5 g/dL (ref 6.4–8.3)
SODIUM: 131 mmol/L — ABNORMAL LOW (ref 136–145)

## 2022-04-14 LAB — LIPID PANEL
CHOL/HDL RATIO: 8.7
CHOLESTEROL: 262 mg/dL — ABNORMAL HIGH (ref 100–200)
HDL CHOL: 30 mg/dL — ABNORMAL LOW (ref >=50)
LDL CALC: 195 mg/dL — ABNORMAL HIGH (ref <100)
NON-HDL: 232 mg/dL — ABNORMAL HIGH (ref <=190)
TRIGLYCERIDES: 192 mg/dL — ABNORMAL HIGH (ref <150)
VLDL CALC: 40 mg/dL — ABNORMAL HIGH (ref <30)

## 2022-04-14 LAB — CBC
HCT: 53.9 % — ABNORMAL HIGH (ref 34.8–46.0)
HGB: 18.5 g/dL — ABNORMAL HIGH (ref 11.5–16.0)
MCH: 35.1 pg — ABNORMAL HIGH (ref 26.0–32.0)
MCHC: 34.3 g/dL (ref 31.0–35.5)
MCV: 102.3 fL — ABNORMAL HIGH (ref 78.0–100.0)
MPV: 11.1 fL (ref 8.7–12.5)
PLATELETS: 236 10*3/uL (ref 150–400)
RBC: 5.27 10*6/uL — ABNORMAL HIGH (ref 3.85–5.22)
RDW-CV: 15.9 % — ABNORMAL HIGH (ref 11.5–15.5)
WBC: 12.5 10*3/uL — ABNORMAL HIGH (ref 3.7–11.0)

## 2022-04-14 LAB — HGA1C (HEMOGLOBIN A1C WITH EST AVG GLUCOSE)
ESTIMATED AVERAGE GLUCOSE: 151 mg/dL
HEMOGLOBIN A1C: 6.9 % — ABNORMAL HIGH (ref 4.0–5.6)

## 2022-04-14 LAB — POCT MICROALBUMIN/CREATININE RATIO, URINE
CREATININE, UR RAND: 10 mg/dL
MICROALBUMIN RANDOM URINE: 10 mg/L
MICROALBUMIN/CREAT RATIO: <30 mg/g

## 2022-04-14 MED ORDER — AZELASTINE 137 MCG (0.1 %) NASAL SPRAY
1.0000 | Freq: Two times a day (BID) | NASAL | 0 refills | Status: DC
Start: 2022-04-14 — End: 2022-04-29

## 2022-04-14 MED ORDER — OZEMPIC 0.25 MG OR 0.5 MG (2 MG/3 ML) SUBCUTANEOUS PEN INJECTOR
PEN_INJECTOR | SUBCUTANEOUS | 0 refills | Status: DC
Start: 2022-04-14 — End: 2022-05-11

## 2022-04-14 MED ORDER — DICLOFENAC 1 % TOPICAL GEL
Freq: Four times a day (QID) | CUTANEOUS | 0 refills | Status: AC
Start: 2022-04-14 — End: 2022-05-14

## 2022-04-14 MED ORDER — AMITRIPTYLINE 25 MG TABLET
25.0000 mg | ORAL_TABLET | Freq: Every evening | ORAL | 3 refills | Status: DC
Start: 2022-04-14 — End: 2022-05-26

## 2022-04-14 MED ORDER — ACCU-CHEK GUIDE TEST STRIPS
1.0000 | ORAL_STRIP | Freq: Four times a day (QID) | 2 refills | Status: DC
Start: 2022-04-14 — End: 2022-05-26

## 2022-04-14 MED ORDER — GABAPENTIN 300 MG CAPSULE
300.0000 mg | ORAL_CAPSULE | Freq: Three times a day (TID) | ORAL | 0 refills | Status: DC
Start: 2022-04-14 — End: 2022-05-11

## 2022-04-14 MED ORDER — PANTOPRAZOLE 40 MG TABLET,DELAYED RELEASE
40.0000 mg | DELAYED_RELEASE_TABLET | Freq: Every day | ORAL | 0 refills | Status: DC
Start: 2022-04-14 — End: 2022-05-11

## 2022-04-14 MED ORDER — PEN NEEDLE, DIABETIC 32 GAUGE X 5/32"
2 refills | Status: DC
Start: 2022-04-14 — End: 2022-04-29

## 2022-04-14 MED ORDER — METHYLPREDNISOLONE 4 MG TABLETS IN A DOSE PACK
ORAL_TABLET | ORAL | 0 refills | Status: DC
Start: 2022-04-14 — End: 2022-04-29

## 2022-04-14 MED ORDER — MELOXICAM 15 MG TABLET
15.0000 mg | ORAL_TABLET | Freq: Every day | ORAL | 0 refills | Status: DC
Start: 2022-04-14 — End: 2022-05-11

## 2022-04-14 MED ORDER — AZITHROMYCIN 250 MG TABLET
ORAL_TABLET | ORAL | 0 refills | Status: DC
Start: 2022-04-14 — End: 2022-04-29

## 2022-04-14 MED ORDER — FLUTICASONE PROPIONATE 50 MCG/ACTUATION NASAL SPRAY,SUSPENSION
2.0000 | Freq: Two times a day (BID) | NASAL | 1 refills | Status: DC
Start: 2022-04-14 — End: 2022-05-11

## 2022-04-14 MED ORDER — ALBUTEROL SULFATE HFA 90 MCG/ACTUATION AEROSOL INHALER
INHALATION_SPRAY | RESPIRATORY_TRACT | 0 refills | Status: DC
Start: 2022-04-14 — End: 2022-05-26

## 2022-04-14 MED ORDER — PEN NEEDLE, DIABETIC 32 GAUGE X 1/4"
3 refills | Status: DC
Start: 2022-04-14 — End: 2023-09-06
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
## 2022-04-15 ENCOUNTER — Other Ambulatory Visit (INDEPENDENT_AMBULATORY_CARE_PROVIDER_SITE_OTHER): Payer: Self-pay | Admitting: Internal Medicine

## 2022-04-15 MED ORDER — EPINEPHRINE 0.3 MG/0.3 ML INJECTION, AUTO-INJECTOR
0.3000 mg | AUTO-INJECTOR | Freq: Once | INTRAMUSCULAR | 1 refills | Status: DC | PRN
Start: 2022-04-15 — End: 2022-04-29

## 2022-04-28 ENCOUNTER — Telehealth (INDEPENDENT_AMBULATORY_CARE_PROVIDER_SITE_OTHER): Payer: Self-pay | Admitting: Internal Medicine

## 2022-04-28 ENCOUNTER — Encounter (INDEPENDENT_AMBULATORY_CARE_PROVIDER_SITE_OTHER): Payer: Self-pay | Admitting: Internal Medicine

## 2022-04-29 ENCOUNTER — Other Ambulatory Visit: Payer: Self-pay

## 2022-04-29 ENCOUNTER — Telehealth (INDEPENDENT_AMBULATORY_CARE_PROVIDER_SITE_OTHER): Payer: MEDICAID | Admitting: Internal Medicine

## 2022-04-29 ENCOUNTER — Other Ambulatory Visit (INDEPENDENT_AMBULATORY_CARE_PROVIDER_SITE_OTHER): Payer: Self-pay | Admitting: Internal Medicine

## 2022-04-29 DIAGNOSIS — E785 Hyperlipidemia, unspecified: Secondary | ICD-10-CM

## 2022-04-29 DIAGNOSIS — F411 Generalized anxiety disorder: Secondary | ICD-10-CM

## 2022-04-29 DIAGNOSIS — K219 Gastro-esophageal reflux disease without esophagitis: Secondary | ICD-10-CM

## 2022-04-29 DIAGNOSIS — K5900 Constipation, unspecified: Secondary | ICD-10-CM

## 2022-04-29 DIAGNOSIS — J449 Chronic obstructive pulmonary disease, unspecified: Secondary | ICD-10-CM

## 2022-04-29 DIAGNOSIS — E1142 Type 2 diabetes mellitus with diabetic polyneuropathy: Secondary | ICD-10-CM

## 2022-04-29 MED ORDER — ROSUVASTATIN 20 MG TABLET
20.0000 mg | ORAL_TABLET | Freq: Every evening | ORAL | 4 refills | Status: DC
Start: 2022-04-29 — End: 2023-04-19

## 2022-04-29 MED ORDER — EPINEPHRINE 0.3 MG/0.3 ML INJECTION, AUTO-INJECTOR
0.3000 mg | AUTO-INJECTOR | Freq: Once | INTRAMUSCULAR | 1 refills | Status: DC | PRN
Start: 2022-04-29 — End: 2023-04-26

## 2022-04-29 MED ORDER — DOCUSATE SODIUM 100 MG CAPSULE
100.0000 mg | ORAL_CAPSULE | Freq: Two times a day (BID) | ORAL | Status: DC
Start: 2022-04-29 — End: 2022-07-18

## 2022-04-29 MED ORDER — FAMOTIDINE 20 MG TABLET
20.0000 mg | ORAL_TABLET | Freq: Every day | ORAL | 0 refills | Status: DC | PRN
Start: 2022-04-29 — End: 2022-05-26

## 2022-04-29 MED ORDER — SUMATRIPTAN 50 MG TABLET
50.0000 mg | ORAL_TABLET | Freq: Once | ORAL | 0 refills | Status: DC | PRN
Start: 2022-04-29 — End: 2022-07-18

## 2022-04-29 MED ORDER — BUDESONIDE-FORMOTEROL HFA 160 MCG-4.5 MCG/ACTUATION AEROSOL INHALER
2.0000 | INHALATION_SPRAY | Freq: Two times a day (BID) | RESPIRATORY_TRACT | 0 refills | Status: DC
Start: 2022-04-29 — End: 2022-05-11

## 2022-04-29 MED ORDER — AZELASTINE 137 MCG (0.1 %) NASAL SPRAY
1.0000 | Freq: Two times a day (BID) | NASAL | 0 refills | Status: DC
Start: 2022-04-29 — End: 2022-05-11

## 2022-04-29 NOTE — Progress Notes (Signed)
[facility]  [address] [place] [address]  Phone: [phone]  Fax: [phone]    Encounter Date: 04/29/2022    Patient ID:  Yvette Schmidt  [id number]    DOB: 1975-08-28  Age: 47 y.o. female    \TELEHEALTH VISIT  Subjective:     Patient inquiring about recent lab results. Reports she continues to experience neuropathic pain despite the gabapentin. Reports the pain is most bothersome at night however persists throughout the day. She also reports experiencing persistent anxiety symptoms, she has not received her klonopin ordered last encounter. Reports that Protonix is helping with GERD symptoms however she is continuing to experience break through acid reflux symptoms. She also reports experiencing some constipation, colace has helped in the past.         Current Outpatient Medications   Medication Sig    ACCU-CHEK GUIDE GLUCOSE METER Does not apply Misc     albuterol sulfate (PROVENTIL) 2.5 mg /3 mL (0.083 %) Inhalation nebulizer solution USE 1 VIAL VIA NEBULIZER FOUR TIMES DAILY    albuterol sulfate (VENTOLIN HFA) 90 mcg/actuation Inhalation oral inhaler INHALE 1-2 PUFFS BY INHALATION EVERY 6 HOURS AS NEEDED    amitriptyline (ELAVIL) 25 mg Oral Tablet Take 1 Tablet (25 mg total) by mouth Every night    azelastine (ASTELIN) 137 mcg (0.1 %) Nasal Aerosol, Spray Administer 1 Spray into each nostril Twice daily for 30 days Use in each nostril as directed    Blood Sugar Diagnostic (ACCU-CHEK GUIDE TEST STRIPS) Does not apply Strip 1 Strip by Other route Four times a day - before meals and bedtime for 90 days    budesonide-formoteroL (SYMBICORT) 160-4.5 mcg/actuation Inhalation oral inhaler Take 2 Puffs by inhalation Twice daily for 30 days    diclofenac sodium (VOLTAREN) 1 % Gel Apply topically Four times a day for 30 days    docusate sodium (COLACE) 100 mg Oral Capsule Take 1 Capsule (100 mg total) by mouth Twice daily    EPINEPHrine 0.3 mg/0.3 mL Injection Auto-Injector Inject 0.3 mL  (0.3 mg total) into the muscle Once, as needed for up to 2 doses    famotidine (PEPCID) 20 mg Oral Tablet Take 1 Tablet (20 mg total) by mouth Once per day as needed for up to 30 days    fluticasone propionate (FLONASE) 50 mcg/actuation Nasal Spray, Suspension Administer 2 Sprays into each nostril Twice daily    gabapentin (NEURONTIN) 300 mg Oral Capsule Take 1 Capsule (300 mg total) by mouth Three times a day    insulin aspart U-100 (NOVOLOG FLEXPEN U-100 INSULIN) 100 unit/mL (3 mL) Subcutaneous Insulin Pen Inject 10 Units under the skin Twice a day before meals Sliding scale   <150 0  150-200 2 units  201-250 4 units  251-300 6 units  301-350 9 units  351-400 12 units    Insulin Needles, Disposable, (SURE COMFORT PEN NEEDLE) 32 gauge x 1/4" Needle USE TO INJECT INSULIN TWICE DAILY    ipratropium (ATROVENT) 0.02 % Inhalation Solution TAKE 3 ML BY NEBULIZATION FOUR TIMES A DAY    meloxicam (MOBIC) 15 mg Oral Tablet Take 1 Tablet (15 mg total) by mouth Once a day for 30 days    pantoprazole (PROTONIX) 40 mg Oral Tablet, Delayed Release (E.C.) Take 1 Tablet (40 mg total) by mouth Once a day for 30 days    rosuvastatin (CRESTOR) 20 mg Oral Tablet Take 1 Tablet (20 mg total) by mouth Every evening    semaglutide (  OZEMPIC) 0.25 mg or 0.5 mg (2 mg/3 mL) Subcutaneous Pen Injector INJECT 0.5ML UNDER THE SKIN EVERY 7 DAYS    SUMAtriptan (IMITREX) 50 mg Oral Tablet Take 1 Tablet (50 mg total) by mouth Once, as needed for Migraine for up to 30 days May repeat in 2 hours in needed     Allergies   Allergen Reactions    Beeswax      Allergic to Bee Stings    Clindamycin     Hydrocodone     Other      Nutrasweet, sugar subs    Sulfa (Sulfonamides)      HIVES    Theophylline      ANXIETY     Past Medical History:   Diagnosis Date    Asthma     COPD (chronic obstructive pulmonary disease) (CMS HCC)     Diabetes mellitus, type 2 (CMS HCC)     GERD (gastroesophageal reflux disease)     Hypertension          Past Surgical History:    Procedure Laterality Date    CORONARY ARTERY ANGIOPLASTY      HX HERNIA REPAIR      HX HYSTERECTOMY  2012    Left ooperectomy, R. ovary retained    HX LAP CHOLECYSTECTOMY  05/12/2017    PB COLONOSCOPY,DIAGNOSTIC           Family Medical History:    None         Social History     Tobacco Use    Smoking status: Every Day     Current packs/day: 0.50     Average packs/day: 0.5 packs/day for 15.0 years (7.5 ttl pk-yrs)     Types: Cigarettes    Smokeless tobacco: Never   Substance Use Topics    Alcohol use: Not Currently     Comment: seldom    Drug use: Yes     Types: Marijuana     Comment: occasionally       Review of Systems   All other systems reviewed and are negative.  Other than mentioned in HPI  Objective:     The 10-year ASCVD risk score (Arnett DK, et al., 2019) is: 20%    Values used to calculate the score:      Age: [age] years      Sex: Female      Is Non-Hispanic African American: No      Diabetic: Yes      Tobacco smoker: Yes      Systolic Blood Pressure: 126 mmHg      Is BP treated: No      HDL Cholesterol: 30 mg/dL      Total Cholesterol: 262 mg/dL      Assessment & Plan:     ENCOUNTER DIAGNOSES     ICD-10-CM   1. Type 2 diabetes mellitus with peripheral neuropathy (CMS HCC)  E11.42   2. GAD (generalized anxiety disorder)  F41.1   3. Gastroesophageal reflux disease, unspecified whether esophagitis present  K21.9   4. Constipation, unspecified constipation type  K59.00   5. Morbid obesity (CMS HCC)  E66.01   6. Chronic obstructive pulmonary disease, unspecified COPD type (CMS HCC)  J44.9   7. Hyperlipidemia, unspecified hyperlipidemia type  E78.5       DM2  Neuropathy  Morbid Obesity  HLD  ASCVD 20%  - Assessment: Yvette Schmidt experiences neuropathy with significant pain, burning, and tingling in her feet, partially managed with Neurontin (gabapentin) 300  mg three times a day and Amitriptyline 25 mg at bedtime.   - Currently on Ozempic 0.5mg  and novalog sliding scale.   - Labs 04/14/22 LDL 195, total cholesterol  262  Plan   - Increase gabapentin from 300 TID to 300 morning and evening and 600qhs.   - Discuss increasing Ozempic dosage next visit  - Patient due for eye exam, will look into local offices covered by insurance.   - Patient agreeable to starting a statin, will start Crestor 20 mg     Anxiety  - Assessment: Yvette Schmidt reports long-standing anxiety, significantly impacting her sleep and quality of life. She has a history of using various medications for anxiety, including against Zoloft and wellbutrin, which were not effectove and she experienced side effects and a past use of klonopin which she found affective. Previously on xanax which she did not tolerate  - Plan:    - Start Klonopin 0.5mg  daily PRN    GERD  Constipation   - Patient is currently on Protonix 40 mg daily. Reports this is helping with reflux symptoms however she continues to experience breakthrough acid reflux.   - Patient has a history of constipation, reports bowel movement every few days.   Plan   - Prescribe Pepcid 20 mg daily for breakthrough acid reflux. Will discuss consultation with GI for possible EGD in the future.   - Prescribe colace daily PRN     Asthma and COPD Management  - Assessment: Yvette Schmidt has a history of asthma and COPD, currently using DuoNeb for nebulizer treatment and Ventolin as a rescue inhaler.  Plan   - Prescribe Symbicort, continue ventolin PRN   - Nebulizer supplies ordered.       Total Time Spen 45 minutes     Garvin FilaBaylee Quintin Hjort, [place]

## 2022-05-09 ENCOUNTER — Other Ambulatory Visit (INDEPENDENT_AMBULATORY_CARE_PROVIDER_SITE_OTHER): Payer: Self-pay | Admitting: Internal Medicine

## 2022-05-09 MED ORDER — CLONAZEPAM 0.5 MG TABLET
0.5000 mg | ORAL_TABLET | Freq: Every day | ORAL | 0 refills | Status: DC
Start: 2022-05-09 — End: 2022-05-26

## 2022-05-11 ENCOUNTER — Other Ambulatory Visit (INDEPENDENT_AMBULATORY_CARE_PROVIDER_SITE_OTHER): Payer: Self-pay | Admitting: Internal Medicine

## 2022-05-11 MED ORDER — OZEMPIC 0.25 MG OR 0.5 MG (2 MG/3 ML) SUBCUTANEOUS PEN INJECTOR
PEN_INJECTOR | SUBCUTANEOUS | 0 refills | Status: DC
Start: 2022-05-11 — End: 2022-05-26

## 2022-05-11 MED ORDER — FLUTICASONE PROPIONATE 50 MCG/ACTUATION NASAL SPRAY,SUSPENSION
2.0000 | Freq: Two times a day (BID) | NASAL | 0 refills | Status: DC
Start: 2022-05-11 — End: 2022-06-27

## 2022-05-11 MED ORDER — INSULIN ASPART (U-100) 100 UNIT/ML (3 ML) SUBCUTANEOUS PEN
10.0000 [IU] | PEN_INJECTOR | Freq: Two times a day (BID) | SUBCUTANEOUS | 0 refills | Status: DC
Start: 2022-05-11 — End: 2022-08-01

## 2022-05-11 MED ORDER — MELOXICAM 15 MG TABLET
15.0000 mg | ORAL_TABLET | Freq: Every day | ORAL | 0 refills | Status: DC
Start: 2022-05-11 — End: 2022-06-09

## 2022-05-11 MED ORDER — BUDESONIDE-FORMOTEROL HFA 160 MCG-4.5 MCG/ACTUATION AEROSOL INHALER
2.0000 | INHALATION_SPRAY | Freq: Two times a day (BID) | RESPIRATORY_TRACT | 0 refills | Status: DC
Start: 2022-05-11 — End: 2022-05-26

## 2022-05-11 MED ORDER — PANTOPRAZOLE 40 MG TABLET,DELAYED RELEASE
40.0000 mg | DELAYED_RELEASE_TABLET | Freq: Every day | ORAL | 0 refills | Status: DC
Start: 2022-05-11 — End: 2022-05-26

## 2022-05-11 MED ORDER — AZELASTINE 137 MCG (0.1 %) NASAL SPRAY
1.0000 | Freq: Two times a day (BID) | NASAL | 0 refills | Status: DC
Start: 2022-05-11 — End: 2022-07-11

## 2022-05-11 MED ORDER — GABAPENTIN 300 MG CAPSULE
300.0000 mg | ORAL_CAPSULE | Freq: Three times a day (TID) | ORAL | 0 refills | Status: DC
Start: 2022-05-11 — End: 2022-05-26

## 2022-05-11 MED ORDER — INSULIN ASPART (U-100) 100 UNIT/ML (3 ML) SUBCUTANEOUS PEN
10.0000 [IU] | PEN_INJECTOR | Freq: Two times a day (BID) | SUBCUTANEOUS | 0 refills | Status: DC
Start: 2022-05-11 — End: 2022-05-11

## 2022-05-12 ENCOUNTER — Telehealth (INDEPENDENT_AMBULATORY_CARE_PROVIDER_SITE_OTHER): Payer: Self-pay | Admitting: Internal Medicine

## 2022-05-26 ENCOUNTER — Telehealth (INDEPENDENT_AMBULATORY_CARE_PROVIDER_SITE_OTHER): Payer: MEDICAID | Admitting: Internal Medicine

## 2022-05-26 ENCOUNTER — Other Ambulatory Visit (INDEPENDENT_AMBULATORY_CARE_PROVIDER_SITE_OTHER): Payer: Self-pay | Admitting: Internal Medicine

## 2022-05-26 ENCOUNTER — Other Ambulatory Visit: Payer: Self-pay

## 2022-05-26 DIAGNOSIS — F411 Generalized anxiety disorder: Secondary | ICD-10-CM

## 2022-05-26 DIAGNOSIS — E119 Type 2 diabetes mellitus without complications: Secondary | ICD-10-CM

## 2022-05-26 DIAGNOSIS — F17210 Nicotine dependence, cigarettes, uncomplicated: Secondary | ICD-10-CM

## 2022-05-26 DIAGNOSIS — E785 Hyperlipidemia, unspecified: Secondary | ICD-10-CM

## 2022-05-26 DIAGNOSIS — K219 Gastro-esophageal reflux disease without esophagitis: Secondary | ICD-10-CM

## 2022-05-26 DIAGNOSIS — J449 Chronic obstructive pulmonary disease, unspecified: Secondary | ICD-10-CM

## 2022-05-26 MED ORDER — AMITRIPTYLINE 25 MG TABLET
25.0000 mg | ORAL_TABLET | Freq: Every evening | ORAL | 3 refills | Status: DC
Start: 2022-05-26 — End: 2022-06-27

## 2022-05-26 MED ORDER — GABAPENTIN 600 MG TABLET
600.0000 mg | ORAL_TABLET | Freq: Every evening | ORAL | 0 refills | Status: DC
Start: 2022-05-26 — End: 2022-06-10

## 2022-05-26 MED ORDER — PANTOPRAZOLE 40 MG TABLET,DELAYED RELEASE
40.0000 mg | DELAYED_RELEASE_TABLET | Freq: Two times a day (BID) | ORAL | 0 refills | Status: DC
Start: 2022-05-26 — End: 2022-10-05

## 2022-05-26 MED ORDER — OZEMPIC 1 MG/DOSE (4 MG/3 ML) SUBCUTANEOUS PEN INJECTOR
1.0000 mg | PEN_INJECTOR | SUBCUTANEOUS | 5 refills | Status: DC
Start: 2022-05-26 — End: 2022-06-27

## 2022-05-26 MED ORDER — GABAPENTIN 300 MG CAPSULE
300.0000 mg | ORAL_CAPSULE | Freq: Two times a day (BID) | ORAL | 0 refills | Status: DC
Start: 2022-05-26 — End: 2022-06-10

## 2022-05-26 MED ORDER — ACCU-CHEK GUIDE TEST STRIPS
1.0000 | ORAL_STRIP | Freq: Four times a day (QID) | 2 refills | Status: AC
Start: 2022-05-26 — End: 2022-08-24

## 2022-05-26 MED ORDER — NEBULIZERS
0 refills | Status: DC
Start: 2022-05-26 — End: 2023-09-06

## 2022-05-26 MED ORDER — CLONAZEPAM 0.5 MG TABLET
0.5000 mg | ORAL_TABLET | Freq: Every day | ORAL | 0 refills | Status: DC
Start: 2022-05-26 — End: 2022-06-27

## 2022-05-26 MED ORDER — ALBUTEROL SULFATE HFA 90 MCG/ACTUATION AEROSOL INHALER
INHALATION_SPRAY | RESPIRATORY_TRACT | 2 refills | Status: DC
Start: 2022-05-26 — End: 2022-06-27

## 2022-05-26 MED ORDER — BUDESONIDE-FORMOTEROL HFA 160 MCG-4.5 MCG/ACTUATION AEROSOL INHALER
2.0000 | INHALATION_SPRAY | Freq: Two times a day (BID) | RESPIRATORY_TRACT | 2 refills | Status: DC
Start: 2022-05-26 — End: 2022-11-15

## 2022-06-06 NOTE — Progress Notes (Signed)
[facility]  [address] [place] [address]  Phone: [phone]  Fax: [phone]    Encounter Date: 05/26/2022    Patient ID:  Yvette Schmidt  [id number]    DOB: 1975/02/22  Age: 47 y.o. female    TELEHEALTH VISIT  Subjective:     Chief Complaint   Patient presents with    Medication Check     Patient Presentation and Concerns:  The patient, Yvette Schmidt, reports feeling a little better but is experiencing burning and discomfort in her feet at night, which has been causing her back and legs to feel like they are getting electric cramps. She has difficulty using the bathroom and has been taking one to two stool softeners a day. Yvette Schmidt's 7-day average blood sugar is 169, and she occasionally experiences low blood sugar levels. Her stomach has been feeling bloated and painful around her navel.    Current Medications and Treatment History:  Yvette Schmidt has been taking 600 mg of gabapentin at night, 0.5 mg of Ozempic, 0.5 mg of Klonopin (split into 0.25 mg in the morning and 0.25 mg at night), Ventolin inhaler, Symbicort (getting low), Amitriptyline (due for refill), Flonase (due for refill), Protonix and Pepcid for acid reflux and GERD, and Crestor for cholesterol. She has experienced small water blisters in areas where she gets hot and has a history of hidradenitis suppurativa (HS) with recent flare-ups.            Current Outpatient Medications   Medication Sig    ACCU-CHEK GUIDE GLUCOSE METER Does not apply Misc     albuterol sulfate (PROVENTIL) 2.5 mg /3 mL (0.083 %) Inhalation nebulizer solution USE 1 VIAL VIA NEBULIZER FOUR TIMES DAILY    albuterol sulfate (VENTOLIN HFA) 90 mcg/actuation Inhalation oral inhaler INHALE 1-2 PUFFS BY INHALATION EVERY 6 HOURS AS NEEDED    amitriptyline (ELAVIL) 25 mg Oral Tablet Take 1 Tablet (25 mg total) by mouth Every night    azelastine (ASTELIN) 137 mcg (0.1 %) Nasal Aerosol, Spray Administer 1 Spray into each nostril Twice daily for 30 days    Blood Sugar  Diagnostic (ACCU-CHEK GUIDE TEST STRIPS) Does not apply Strip 1 Strip by Other route Four times a day - before meals and bedtime for 90 days    budesonide-formoteroL (SYMBICORT) 160-4.5 mcg/actuation Inhalation oral inhaler Take 2 Puffs by inhalation Twice daily for 30 days    clonazePAM (KLONOPIN) 0.5 mg Oral Tablet Take 1 Tablet (0.5 mg total) by mouth Once a day for 30 days    docusate sodium (COLACE) 100 mg Oral Capsule Take 1 Capsule (100 mg total) by mouth Twice daily    EPINEPHrine 0.3 mg/0.3 mL Injection Auto-Injector Inject 0.3 mL (0.3 mg total) into the muscle Once, as needed for up to 2 doses    fluticasone propionate (FLONASE) 50 mcg/actuation Nasal Spray, Suspension Administer 2 Sprays into each nostril Twice daily    gabapentin (NEURONTIN) 300 mg Oral Capsule Take 1 Capsule (300 mg total) by mouth Twice daily    gabapentin (NEURONTIN) 600 mg Oral Tablet Take 1 Tablet (600 mg total) by mouth Every evening for 30 days    insulin aspart U-100 (NOVOLOG FLEXPEN U-100 INSULIN) 100 unit/mL (3 mL) Subcutaneous Insulin Pen Inject 10 Units under the skin Twice a day before meals for 30 days Sliding scale <150 0 150-200 2 units 201-250 4 units 251-300 6 units 301-350 9 units 351-400 12 units    Insulin Needles, Disposable, (SURE COMFORT PEN NEEDLE)  32 gauge x 1/4" Needle USE TO INJECT INSULIN TWICE DAILY    ipratropium (ATROVENT) 0.02 % Inhalation Solution TAKE 3 ML BY NEBULIZATION FOUR TIMES A DAY    meloxicam (MOBIC) 15 mg Oral Tablet Take 1 Tablet (15 mg total) by mouth Once a day for 30 days    Nebulizers Misc Use with Nebulizer medication    pantoprazole (PROTONIX) 40 mg Oral Tablet, Delayed Release (E.C.) Take 1 Tablet (40 mg total) by mouth Twice daily for 90 days    rosuvastatin (CRESTOR) 20 mg Oral Tablet Take 1 Tablet (20 mg total) by mouth Every evening    semaglutide (OZEMPIC) 1 mg/dose (4 mg/3 mL) Subcutaneous Pen Injector Inject 1 mg under the skin Every 7 days     Allergies   Allergen Reactions     Beeswax      Allergic to Bee Stings    Clindamycin     Hydrocodone     Other      Nutrasweet, sugar subs    Sulfa (Sulfonamides)      HIVES    Theophylline      ANXIETY     Past Medical History:   Diagnosis Date    Asthma     COPD (chronic obstructive pulmonary disease) (CMS HCC)     Diabetes mellitus, type 2 (CMS HCC)     GERD (gastroesophageal reflux disease)     Hypertension          Past Surgical History:   Procedure Laterality Date    CORONARY ARTERY ANGIOPLASTY      HX HERNIA REPAIR      HX HYSTERECTOMY  2012    Left ooperectomy, R. ovary retained    HX LAP CHOLECYSTECTOMY  05/12/2017    PB COLONOSCOPY,DIAGNOSTIC           Family Medical History:    None         Social History     Tobacco Use    Smoking status: Every Day     Current packs/day: 0.50     Average packs/day: 0.5 packs/day for 15.0 years (7.5 ttl pk-yrs)     Types: Cigarettes    Smokeless tobacco: Never   Substance Use Topics    Alcohol use: Not Currently     Comment: seldom    Drug use: Yes     Types: Marijuana     Comment: occasionally       Review of Systems   All other systems reviewed and are negative.  Other than mentioned in HPI  Objective:   Telehealth visit     Assessment & Plan:     ENCOUNTER DIAGNOSES     ICD-10-CM   1. DM2 (diabetes mellitus, type 2) (CMS HCC)  E11.9   2. Hyperlipidemia, unspecified hyperlipidemia type  E78.5   3. Morbid obesity (CMS HCC)  E66.01   4. Chronic obstructive pulmonary disease, unspecified COPD type (CMS HCC)  J44.9   5. Tobacco dependence due to cigarettes  F17.210   6. GAD (generalized anxiety disorder)  F41.1   7. Gastroesophageal reflux disease, unspecified whether esophagitis present  K21.9       DM2 w/ Neuropathy  HLD  Morbid Obesity   - Reports her 7-day average blood sugar level is 169.  - Currently on Ozempic 0.5mg , Novolog 10 units BID with meals. Reports some constipation with Ozempic which is managed with OTC stool softeners PRN, reports consistent bowel movement daily or every other day.   -  04/14/22  A1c 6.9, Urine microalbumin/creatinine <30  - Currently on gabapentin 300 mg TID, has been 300 mg in the day and 600 mg in the evening when the neuropathy is worse, reports some improvement, however still present. Also on amitriptyline 25 mg at bedtime  - Started Crestor 20 mg last visit, patient denies adverse effects   Plan   - Increase Ozempic to 1mg    - Increase gabapentin to 300 mg morning and midday and 600 mg in the evening    Mental Health  Anxiety   Rada reports long-standing anxiety, significantly impacting her sleep and quality of life. She has a history of using various medications for anxiety, including against Zoloft and wellbutrin, which were not effective and she experienced side effects and a past use of klonopin which she found effective. Previously on xanax which she did not tolerate   - Patient started klonopin 0.5mg  last visit for anxiety and panic episodes. Reports often taking it 1/2 morning and evening. Reports improvement in anxiety symptoms  Plan   - Continue Klonopin 0.5mg  daily PRN. Have discussed and patient is aware that the medication is controlled and known to cause dependence.     GERD  - Currently on Pepcid 20 mg daily PRN and Protonix 40 mg daily. Reports improvement in reflux symptoms however still presents and inquires about increasing Protonix.   Plan   - Discontinue Pepcid, increase Protonix to 40 mg BID.     Asthma and COPD Management  Current Smoker  - Assessment: Ramya has a history of asthma and COPD, previously using DuoNeb for nebulizer treatment and Ventolin as a rescue inhaler.  - Started Symbicort last visit, patient reports improvement  Plan   - Continue Symbicort and Ventolin  - Continue to discuss smoking cessation      Hidradenitis Suppurativa  - Reports a history of diagnosis.   - Reports recently experiencing fluid filled vesicular lesions between the thighs and upper arms  Plan   - Advised to keep the areas clean and dry.     Leukocytosis   Erythrocytosis   - Patient has a history of elevated WBC, HGB, HCT. She does smoke   Plan   - Next visit in person, will evaluate peripheral cytometry ans Jak2     Total Time Spent: 40 minutes     Orders Placed This Encounter    semaglutide (OZEMPIC) 1 mg/dose (4 mg/3 mL) Subcutaneous Pen Injector    amitriptyline (ELAVIL) 25 mg Oral Tablet    albuterol sulfate (VENTOLIN HFA) 90 mcg/actuation Inhalation oral inhaler    budesonide-formoteroL (SYMBICORT) 160-4.5 mcg/actuation Inhalation oral inhaler    Blood Sugar Diagnostic (ACCU-CHEK GUIDE TEST STRIPS) Does not apply Strip    pantoprazole (PROTONIX) 40 mg Oral Tablet, Delayed Release (E.C.)    Nebulizers Misc       No follow-ups on file.    Garvin Fila, MA

## 2022-06-09 ENCOUNTER — Other Ambulatory Visit (INDEPENDENT_AMBULATORY_CARE_PROVIDER_SITE_OTHER): Payer: Self-pay | Admitting: Internal Medicine

## 2022-06-10 ENCOUNTER — Other Ambulatory Visit (INDEPENDENT_AMBULATORY_CARE_PROVIDER_SITE_OTHER): Payer: Self-pay | Admitting: Internal Medicine

## 2022-06-27 ENCOUNTER — Telehealth (INDEPENDENT_AMBULATORY_CARE_PROVIDER_SITE_OTHER): Payer: MEDICAID | Admitting: Internal Medicine

## 2022-06-27 ENCOUNTER — Other Ambulatory Visit: Payer: Self-pay

## 2022-06-27 ENCOUNTER — Other Ambulatory Visit (INDEPENDENT_AMBULATORY_CARE_PROVIDER_SITE_OTHER): Payer: Self-pay | Admitting: Internal Medicine

## 2022-06-27 DIAGNOSIS — E119 Type 2 diabetes mellitus without complications: Secondary | ICD-10-CM

## 2022-06-27 DIAGNOSIS — J449 Chronic obstructive pulmonary disease, unspecified: Secondary | ICD-10-CM

## 2022-06-27 DIAGNOSIS — F411 Generalized anxiety disorder: Secondary | ICD-10-CM

## 2022-06-27 DIAGNOSIS — F17210 Nicotine dependence, cigarettes, uncomplicated: Secondary | ICD-10-CM

## 2022-06-27 DIAGNOSIS — E785 Hyperlipidemia, unspecified: Secondary | ICD-10-CM

## 2022-06-27 MED ORDER — AMITRIPTYLINE 25 MG TABLET
50.0000 mg | ORAL_TABLET | Freq: Every evening | ORAL | 0 refills | Status: DC
Start: 2022-06-27 — End: 2022-07-18

## 2022-06-27 MED ORDER — ALBUTEROL SULFATE HFA 90 MCG/ACTUATION AEROSOL INHALER
INHALATION_SPRAY | RESPIRATORY_TRACT | 2 refills | Status: DC
Start: 2022-06-27 — End: 2022-07-18

## 2022-06-27 MED ORDER — GABAPENTIN 300 MG CAPSULE
300.0000 mg | ORAL_CAPSULE | Freq: Four times a day (QID) | ORAL | 0 refills | Status: DC
Start: 2022-06-27 — End: 2022-07-14

## 2022-06-27 MED ORDER — FLUTICASONE PROPIONATE 50 MCG/ACTUATION NASAL SPRAY,SUSPENSION
2.0000 | Freq: Two times a day (BID) | NASAL | 0 refills | Status: DC
Start: 2022-06-27 — End: 2022-07-18

## 2022-06-27 MED ORDER — CLONAZEPAM 0.5 MG TABLET
0.2500 mg | ORAL_TABLET | Freq: Three times a day (TID) | ORAL | 0 refills | Status: DC
Start: 2022-06-27 — End: 2022-08-09

## 2022-06-27 MED ORDER — SACCHAROMYCES BOULARDII 250 MG CAPSULE
250.0000 mg | ORAL_CAPSULE | Freq: Once | ORAL | Status: AC
Start: 2022-06-27 — End: ?

## 2022-06-27 MED ORDER — SEMAGLUTIDE 0.25 MG OR 0.5 MG (2 MG/3 ML) SUBCUTANEOUS PEN INJECTOR
0.5000 mg | PEN_INJECTOR | SUBCUTANEOUS | 0 refills | Status: DC
Start: 2022-06-27 — End: 2022-07-14

## 2022-06-27 NOTE — Progress Notes (Signed)
[facility]  [address] [place] [address]    Progress Note    Encounter Date: 06/27/2022    Patient ID:  Yvette Schmidt  [id number]    DOB: 1975/11/11  Age: 47 y.o. female  Subjective   Subjective:     Chief Complaint   Patient presents with    Follow Up     TELEHEALTH VISIT    The patient presents for a follow-up visit and prescription refill. She reports experiencing gastrointestinal symptoms, including stomach cramps and frequent bathroom visits, which she attributes to a recent increase in her Ozempic dosage to 1 mg. These symptoms started last Wednesday, making it almost a week since their onset. She requests to reduce the Ozempic back to 0.5 mg, as she did not have as much trouble with this dosage.    The patient also discusses her ongoing issues with anxiety, particularly when going out, which leads to forgetfulness during shopping. She is currently taking clonazepam and requests an adjustment from 0.25 mg twice daily to 0.25 mg three times daily to better manage her anxiety throughout the day. She mentions past negative experiences with other medications like BuSpar, Zoloft, and Wellbutrin, which she found unsatisfactory due to side effects.    The patient brings up concerns about a hernia in her stomach, which she describes as larger than a softball and causing discomfort and pressure. She has a history of a fatty liver and previous gallbladder removal. She expresses a desire for a medical review of her hernia during her next visit.    For her current medications, the patient confirms she is taking gabapentin with a regimen of 300 mg in the morning and afternoon and 600 mg at night, along with 25 mg of amitriptyline at night. She reports persistent and painful burning in her feet, which she suspects might be related to plantar fasciitis, neuropathy, or gout, conditions prevalent in her family history. She requests a check of her uric acid levels during her next lab  tests.    The patient also requests a prescription for a strong probiotic, hoping it will be covered by Medicaid, to help manage her stomach issues, which she believes are exacerbated by stress and anxiety. She recalls having gastroesophageal issues in her early twenties, including a colonoscopy.        Current Outpatient Medications   Medication Sig    ACCU-CHEK GUIDE GLUCOSE METER Does not apply Misc     albuterol sulfate (PROVENTIL) 2.5 mg /3 mL (0.083 %) Inhalation nebulizer solution USE 1 VIAL VIA NEBULIZER FOUR TIMES DAILY    albuterol sulfate (VENTOLIN HFA) 90 mcg/actuation Inhalation oral inhaler INHALE 1-2 PUFFS BY INHALATION EVERY 6 HOURS AS NEEDED    amitriptyline (ELAVIL) 25 mg Oral Tablet Take 2 Tablets (50 mg total) by mouth Every night for 30 days    Blood Sugar Diagnostic (ACCU-CHEK GUIDE TEST STRIPS) Does not apply Strip 1 Strip by Other route Four times a day - before meals and bedtime for 90 days    budesonide-formoteroL (SYMBICORT) 160-4.5 mcg/actuation Inhalation oral inhaler Take 2 Puffs by inhalation Twice daily for 30 days    docusate sodium (COLACE) 100 mg Oral Capsule Take 1 Capsule (100 mg total) by mouth Twice daily    EPINEPHrine 0.3 mg/0.3 mL Injection Auto-Injector Inject 0.3 mL (0.3 mg total) into the muscle Once, as needed for up to 2 doses    fluticasone propionate (FLONASE) 50 mcg/actuation Nasal Spray, Suspension Administer 2 Sprays  into each nostril Twice daily    insulin aspart U-100 (NOVOLOG FLEXPEN U-100 INSULIN) 100 unit/mL (3 mL) Subcutaneous Insulin Pen Inject 10 Units under the skin Twice a day before meals for 30 days Sliding scale <150 0 150-200 2 units 201-250 4 units 251-300 6 units 301-350 9 units 351-400 12 units    Insulin Needles, Disposable, (SURE COMFORT PEN NEEDLE) 32 gauge x 1/4" Needle USE TO INJECT INSULIN TWICE DAILY    ipratropium (ATROVENT) 0.02 % Inhalation Solution TAKE 3 ML BY NEBULIZATION FOUR TIMES A DAY    meloxicam (MOBIC) 15 mg Oral Tablet TAKE 1  TABLET (15 MG TOTAL) BY MOUTH ONCE A DAY FOR 30 DAYS    Nebulizers Misc Use with Nebulizer medication    pantoprazole (PROTONIX) 40 mg Oral Tablet, Delayed Release (E.C.) Take 1 Tablet (40 mg total) by mouth Twice daily for 90 days    rosuvastatin (CRESTOR) 20 mg Oral Tablet Take 1 Tablet (20 mg total) by mouth Every evening    semaglutide (OZEMPIC) 0.25 mg or 0.5 mg (2 mg/3 mL) Subcutaneous Pen Injector Inject 0.5 mg under the skin Every 7 days for 90 days     Allergies   Allergen Reactions    Beeswax      Allergic to Bee Stings    Clindamycin     Hydrocodone     Other      Nutrasweet, sugar subs    Sulfa (Sulfonamides)      HIVES    Theophylline      ANXIETY     Past Medical History:   Diagnosis Date    Asthma     COPD (chronic obstructive pulmonary disease) (CMS HCC)     Diabetes mellitus, type 2 (CMS HCC)     GERD (gastroesophageal reflux disease)     Hypertension          Past Surgical History:   Procedure Laterality Date    CORONARY ARTERY ANGIOPLASTY      HX HERNIA REPAIR      HX HYSTERECTOMY  2012    Left ooperectomy, R. ovary retained    HX LAP CHOLECYSTECTOMY  05/12/2017    PB COLONOSCOPY,DIAGNOSTIC           Family Medical History:    None         Social History     Tobacco Use    Smoking status: Every Day     Current packs/day: 0.50     Average packs/day: 0.5 packs/day for 15.0 years (7.5 ttl pk-yrs)     Types: Cigarettes    Smokeless tobacco: Never   Substance Use Topics    Alcohol use: Not Currently     Comment: seldom    Drug use: Yes     Types: Marijuana     Comment: occasionally       Review of Systems   All other systems reviewed and are negative.  Other than mentioned in HPI     Objective   Objective:   Telehealth visit, physical exam could not be performed      Assessment & Plan:     ENCOUNTER DIAGNOSES     ICD-10-CM   1. DM2 (diabetes mellitus, type 2) (CMS HCC)  E11.9   2. Hyperlipidemia, unspecified hyperlipidemia type  E78.5   3. Morbid obesity (CMS HCC)  E66.01   4. Tobacco dependence due to  cigarettes  F17.210   5. Chronic obstructive pulmonary disease, unspecified COPD type (CMS HCC)  J44.9  6. GAD (generalized anxiety disorder)  F41.1       DM2 w/ Neuropathy  HLD  Morbid Obesity   - Reports her 7-day average blood sugar level is 169.  - Currently on Ozempic 1mg , Novolog 10 units BID with meals. Reports stomach upset with ozempic at 1mg  and requests a dose reduction   - 04/14/22 A1c 6.9, Urine microalbumin/creatinine <30  - Currently on gabapentin 300 mg morning and afternoon, 600 mg in the evening. Also on amitriptyline 25 mg at bedtime. Reports persistent neuropathy pain, worse at night   - Currently on Crestor 20 mg last visit, patient denies adverse effects   Plan   - Reduce Ozempic to 0.5mg     - Increase Elavil to 50 mg      Gastrointestinal Symptoms  - Assessment: Patient reports stomach cramps, burping, and diarrhea since increasing Ozempic to 1 mg.  - Plan:    - Decrease Ozempic dose to 0.5 mg and monitor for improvement in gastrointestinal symptoms.    - Prescribe a probiotic to help with stomach issues, as requested by the patient.    Mental Health  Anxiety   - Currently on Klonopin 0.25 BID. She has a history of using various medications for anxiety, including Zoloft, Buspar and wellbutrin, which were not effective and she experienced side effects.  - Previously on xanax which she did not tolerate   - Patient requests an increase in clonazepam dosage to 0.25 mg three times a day due to persistent anxiety, especially when going out.  Plan   - Increase Klonopin to 0.25mg  TID PRN. Have discussed and patient is aware that the medication is controlled and known to cause dependence.   - Patient to follow up in person next visit     Leukocytosis  Erythrocytosis   - Patient has a history of elevated WBC, HGB, HCT. She does smoke   Plan   - Next visit in person, will evaluate peripheral cytometry ans Jak2     Total time spent: 40 minutes     Orders Placed This Encounter    semaglutide (OZEMPIC)  0.25 mg or 0.5 mg (2 mg/3 mL) Subcutaneous Pen Injector    saccharomyces boulardii (FLORASTOR) capsule    amitriptyline (ELAVIL) 25 mg Oral Tablet    albuterol sulfate (VENTOLIN HFA) 90 mcg/actuation Inhalation oral inhaler    fluticasone propionate (FLONASE) 50 mcg/actuation Nasal Spray, Suspension       No follow-ups on file.    Garvin Fila, MA

## 2022-07-11 ENCOUNTER — Other Ambulatory Visit (INDEPENDENT_AMBULATORY_CARE_PROVIDER_SITE_OTHER): Payer: Self-pay | Admitting: Internal Medicine

## 2022-07-14 ENCOUNTER — Other Ambulatory Visit (INDEPENDENT_AMBULATORY_CARE_PROVIDER_SITE_OTHER): Payer: Self-pay | Admitting: Internal Medicine

## 2022-07-14 MED ORDER — GABAPENTIN 300 MG CAPSULE
300.0000 mg | ORAL_CAPSULE | Freq: Four times a day (QID) | ORAL | 0 refills | Status: DC
Start: 2022-07-14 — End: 2022-07-29

## 2022-07-14 MED ORDER — SEMAGLUTIDE 0.25 MG OR 0.5 MG (2 MG/3 ML) SUBCUTANEOUS PEN INJECTOR
0.5000 mg | PEN_INJECTOR | SUBCUTANEOUS | 0 refills | Status: DC
Start: 2022-07-14 — End: 2022-07-29

## 2022-07-18 ENCOUNTER — Ambulatory Visit (INDEPENDENT_AMBULATORY_CARE_PROVIDER_SITE_OTHER): Payer: MEDICAID | Admitting: Internal Medicine

## 2022-07-18 ENCOUNTER — Other Ambulatory Visit: Payer: Self-pay

## 2022-07-18 ENCOUNTER — Other Ambulatory Visit: Payer: MEDICAID | Attending: Internal Medicine | Admitting: Internal Medicine

## 2022-07-18 DIAGNOSIS — K439 Ventral hernia without obstruction or gangrene: Secondary | ICD-10-CM

## 2022-07-18 DIAGNOSIS — L089 Local infection of the skin and subcutaneous tissue, unspecified: Secondary | ICD-10-CM

## 2022-07-18 DIAGNOSIS — D751 Secondary polycythemia: Secondary | ICD-10-CM | POA: Insufficient documentation

## 2022-07-18 DIAGNOSIS — E119 Type 2 diabetes mellitus without complications: Secondary | ICD-10-CM | POA: Insufficient documentation

## 2022-07-18 DIAGNOSIS — Z79899 Other long term (current) drug therapy: Secondary | ICD-10-CM

## 2022-07-18 DIAGNOSIS — D72829 Elevated white blood cell count, unspecified: Secondary | ICD-10-CM | POA: Insufficient documentation

## 2022-07-18 DIAGNOSIS — M25561 Pain in right knee: Secondary | ICD-10-CM

## 2022-07-18 DIAGNOSIS — M25552 Pain in left hip: Secondary | ICD-10-CM

## 2022-07-18 LAB — COMPREHENSIVE METABOLIC PANEL, NON-FASTING
ALBUMIN: 3.3 g/dL — ABNORMAL LOW (ref 3.5–5.0)
ALKALINE PHOSPHATASE: 99 U/L (ref 40–110)
ALT (SGPT): 12 U/L (ref 8–22)
ANION GAP: 8 mmol/L (ref 4–13)
AST (SGOT): 12 U/L (ref 8–45)
BILIRUBIN TOTAL: 0.3 mg/dL (ref 0.3–1.3)
BUN/CREA RATIO: 9 (ref 6–22)
BUN: 7 mg/dL — ABNORMAL LOW (ref 8–25)
CALCIUM: 9.3 mg/dL (ref 8.6–10.2)
CHLORIDE: 103 mmol/L (ref 96–111)
CO2 TOTAL: 24 mmol/L (ref 22–30)
CREATININE: 0.74 mg/dL (ref 0.60–1.05)
ESTIMATED GFR - FEMALE: >90 mL/min/BSA (ref >=60)
GLUCOSE: 261 mg/dL — ABNORMAL HIGH (ref 65–125)
POTASSIUM: 4.3 mmol/L (ref 3.5–5.1)
PROTEIN TOTAL: 7 g/dL (ref 6.4–8.3)
SODIUM: 135 mmol/L — ABNORMAL LOW (ref 136–145)

## 2022-07-18 LAB — CBC
HCT: 49 % — ABNORMAL HIGH (ref 34.8–46.0)
HGB: 16.4 g/dL — ABNORMAL HIGH (ref 11.5–16.0)
MCH: 33.6 pg — ABNORMAL HIGH (ref 26.0–32.0)
MCHC: 33.5 g/dL (ref 31.0–35.5)
MCV: 100.4 fL — ABNORMAL HIGH (ref 78.0–100.0)
MPV: 11.6 fL (ref 8.7–12.5)
PLATELETS: 219 10*3/uL (ref 150–400)
RBC: 4.88 10*6/uL (ref 3.85–5.22)
RDW-CV: 14.3 % (ref 11.5–15.5)
WBC: 11.1 10*3/uL — ABNORMAL HIGH (ref 3.7–11.0)

## 2022-07-18 MED ORDER — SUMATRIPTAN 50 MG TABLET
50.0000 mg | ORAL_TABLET | Freq: Once | ORAL | 0 refills | Status: DC | PRN
Start: 2022-07-18 — End: 2022-09-13

## 2022-07-18 MED ORDER — FLUTICASONE PROPIONATE 50 MCG/ACTUATION NASAL SPRAY,SUSPENSION
2.0000 | Freq: Two times a day (BID) | NASAL | 0 refills | Status: DC
Start: 2022-07-18 — End: 2022-08-12

## 2022-07-18 MED ORDER — FUROSEMIDE 20 MG TABLET
20.0000 mg | ORAL_TABLET | Freq: Every day | ORAL | 0 refills | Status: DC | PRN
Start: 2022-07-18 — End: 2022-11-11

## 2022-07-18 MED ORDER — AMITRIPTYLINE 50 MG TABLET
50.0000 mg | ORAL_TABLET | Freq: Every evening | ORAL | 0 refills | Status: DC
Start: 2022-07-18 — End: 2022-07-29

## 2022-07-18 MED ORDER — ALBUTEROL SULFATE HFA 90 MCG/ACTUATION AEROSOL INHALER
INHALATION_SPRAY | RESPIRATORY_TRACT | 2 refills | Status: DC
Start: 2022-07-18 — End: 2023-09-06

## 2022-07-18 MED ORDER — CEPHALEXIN 500 MG CAPSULE
500.0000 mg | ORAL_CAPSULE | Freq: Four times a day (QID) | ORAL | 0 refills | Status: AC
Start: 2022-07-18 — End: 2022-07-23

## 2022-07-18 MED ORDER — DOCUSATE SODIUM 100 MG CAPSULE
100.0000 mg | ORAL_CAPSULE | Freq: Two times a day (BID) | ORAL | 0 refills | Status: AC | PRN
Start: 2022-07-18 — End: 2022-10-16

## 2022-07-18 NOTE — Progress Notes (Signed)
[facility]  [address] [place] [id number]    Progress Note    Encounter Date: 07/18/2022    Patient ID:  Yvette Schmidt  WUJ:[id number]    DOB: [birth year]  Age: 47 y.o. female  Subjective   Subjective:     Chief Complaint   Patient presents with    Follow Up     Patient 1 reports difficulty in obtaining transportation to medical appointments due to family dynamics, causing increased anxiety. She mentions ongoing stomach issues, including constipation and diarrhea, and a possible hernia. The patient also reports high anxiety levels and is currently taking Klonopin three times a day. She has been experiencing issues with her blood sugar, which ranges between 180 and 220, and is currently on a Novolog sliding scale. The patient has a history of gastroparesis and is taking Docusate Sodium as a stool softener. She recently sustained a finger injury and is concerned about infection.          Current Outpatient Medications   Medication Sig    ACCU-CHEK GUIDE GLUCOSE METER Does not apply Misc     albuterol sulfate (PROVENTIL) 2.5 mg /3 mL (0.083 %) Inhalation nebulizer solution USE 1 VIAL VIA NEBULIZER FOUR TIMES DAILY    albuterol sulfate (VENTOLIN HFA) 90 mcg/actuation Inhalation oral inhaler INHALE 1-2 PUFFS BY INHALATION EVERY 6 HOURS AS NEEDED    amitriptyline (ELAVIL) 50 mg Oral Tablet Take 1 Tablet (50 mg total) by mouth Every night for 90 days    azelastine (ASTELIN) 137 mcg (0.1 %) Nasal Aerosol, Spray ADMINISTER 1 SPRAY INTO EACH NOSTRIL TWICE DAILY FOR 30 DAYS    Blood Sugar Diagnostic (ACCU-CHEK GUIDE TEST STRIPS) Does not apply Strip 1 Strip by Other route Four times a day - before meals and bedtime for 90 days    budesonide-formoteroL (SYMBICORT) 160-4.5 mcg/actuation Inhalation oral inhaler Take 2 Puffs by inhalation Twice daily for 30 days    cephalexin (KEFLEX) 500 mg Oral Capsule Take 1 Capsule (500 mg total) by mouth Four times a day for 5 days    clonazePAM (KLONOPIN) 0.5 mg  Oral Tablet Take 0.5 Tablets (0.25 mg total) by mouth Three times a day for 30 days    docusate sodium (COLACE) 100 mg Oral Capsule Take 1 Capsule (100 mg total) by mouth Twice per day as needed for Constipation for up to 90 days    EPINEPHrine 0.3 mg/0.3 mL Injection Auto-Injector Inject 0.3 mL (0.3 mg total) into the muscle Once, as needed for up to 2 doses    fluticasone propionate (FLONASE) 50 mcg/actuation Nasal Spray, Suspension Administer 2 Sprays into each nostril Twice daily    furosemide (LASIX) 20 mg Oral Tablet Take 1 Tablet (20 mg total) by mouth Once per day as needed for up to 90 days    gabapentin (NEURONTIN) 300 mg Oral Capsule Take 1 Capsule (300 mg total) by mouth Four times a day for 90 days Take 1 capsule in the morning, 1 capsule in the afternoon and 2 capsules (600 mg) in the evening    insulin aspart U-100 (NOVOLOG FLEXPEN U-100 INSULIN) 100 unit/mL (3 mL) Subcutaneous Insulin Pen Inject 10 Units under the skin Twice a day before meals for 30 days Sliding scale <150 0 150-200 2 units 201-250 4 units 251-300 6 units 301-350 9 units 351-400 12 units    Insulin Needles, Disposable, (SURE COMFORT PEN NEEDLE) 32 gauge x 1/4" Needle USE TO INJECT INSULIN TWICE DAILY  ipratropium (ATROVENT) 0.02 % Inhalation Solution TAKE 3 ML BY NEBULIZATION FOUR TIMES A DAY    meloxicam (MOBIC) 15 mg Oral Tablet TAKE 1 TABLET (15 MG TOTAL) BY MOUTH ONCE A DAY FOR 30 DAYS    Nebulizers Misc Use with Nebulizer medication    pantoprazole (PROTONIX) 40 mg Oral Tablet, Delayed Release (E.C.) Take 1 Tablet (40 mg total) by mouth Twice daily for 90 days    rosuvastatin (CRESTOR) 20 mg Oral Tablet Take 1 Tablet (20 mg total) by mouth Every evening    semaglutide (OZEMPIC) 0.25 mg or 0.5 mg (2 mg/3 mL) Subcutaneous Pen Injector Inject 0.5 mg under the skin Every 7 days for 90 days    SUMAtriptan (IMITREX) 50 mg Oral Tablet Take 1 Tablet (50 mg total) by mouth Once, as needed for Migraine for up to 30 days May repeat in 2  hours in needed     Allergies   Allergen Reactions    Beeswax      Allergic to Bee Stings    Clindamycin     Hydrocodone     Other      Nutrasweet, sugar subs    Sulfa (Sulfonamides)      HIVES    Theophylline      ANXIETY     Past Medical History:   Diagnosis Date    Asthma     COPD (chronic obstructive pulmonary disease) (CMS HCC)     Diabetes mellitus, type 2 (CMS HCC)     GERD (gastroesophageal reflux disease)     Hypertension          Past Surgical History:   Procedure Laterality Date    CORONARY ARTERY ANGIOPLASTY      HX HERNIA REPAIR      HX HYSTERECTOMY  2012    Left ooperectomy, R. ovary retained    HX LAP CHOLECYSTECTOMY  05/12/2017    PB COLONOSCOPY,DIAGNOSTIC           Family Medical History:    None         Social History     Tobacco Use    Smoking status: Every Day     Current packs/day: 0.50     Average packs/day: 0.5 packs/day for 15.0 years (7.5 ttl pk-yrs)     Types: Cigarettes    Smokeless tobacco: Never   Substance Use Topics    Alcohol use: Not Currently     Comment: seldom    Drug use: Yes     Types: Marijuana     Comment: occasionally       Review of Systems   All other systems reviewed and are negative.  Other than mentioned in HPI     Objective   Objective:       Physical Exam  Constitutional:       General: She is not in acute distress.  Eyes:      Extraocular Movements: Extraocular movements intact.      Conjunctiva/sclera: Conjunctivae normal.      Pupils: Pupils are equal, round, and reactive to light.   Cardiovascular:      Rate and Rhythm: Normal rate and regular rhythm.      Heart sounds: S1 normal and S2 normal.   Pulmonary:      Effort: Pulmonary effort is normal.      Breath sounds: Normal breath sounds.   Abdominal:      Hernia: A hernia is present.      Comments: Large central abdominal  wall hernia. No overlying erythema, not exquisitely tender, not evident of incarceration or strangulation    Skin:     General: Skin is warm and dry.      Comments: Superficial laceration to the  R. 4th finger, tender, serous discharge, not significant edema or surrounding cellulitis.    Neurological:      Mental Status: She is alert and oriented to person, place, and time.   Psychiatric:         Mood and Affect: Mood normal.         Behavior: Behavior normal.          Assessment & Plan:     ENCOUNTER DIAGNOSES     ICD-10-CM   1. Leukocytosis, unspecified type  D72.829   2. DM2 (diabetes mellitus, type 2) (CMS HCC)  E11.9   3. Erythrocytosis  D75.1   4. Medication management  Z79.899   5. Hernia of abdominal wall  K43.9   6. Right knee pain  M25.561   7. Left hip pain  M25.552   8. Localized bacterial skin infection  L08.9    B96.89     Abdominal Hernia  - Patient reports she was previously told that the protrusion in her stomach was a hernia, the area is enlarging. Not evident of incarceration or strangulation   Plan   - CT abdomen ordered  - Referral to GI ordered     Musculoskeletal symptoms  - Patient report her R. Knee and L. Hip locking up and causing pain   Plan   - Xrays ordered.     Finger Laceration   - Patient reports dropping a washing machine lid on her finger.   - Superficial laceration to the R. 4th finger, tender, serous discharge, not significant edema or surrounding cellulitis.   - States she has been cleaning the area and using antibiotic ointment  Plan   - Patient advised to receive a tetanus vaccination if last vaccine has been >77yrs ago  - Prescribe Keflex QID X 5 days.     DM2 w/ Neuropathy  HLD  Morbid Obesity   - 04/14/22 A1c 6.9, Urine microalbumin/creatinine <30  - Currently on gabapentin 300 mg morning and afternoon, 600 mg in the evening. Also on amitriptyline 25 mg at bedtime. Reports persistent neuropathy pain, worse at night   - Currently on Crestor 20 mg last visit, patient denies adverse effects   - Assessment: Carissa has a history of diabetes, currently managed with a medium sliding scale of NovoLog.  - Drinking sweet tea   - Sugars 180-220   - Has been off of ozempic for  about 2 weeks, reports improvement in GI symptoms   Plan   - A1c drawn today   - Increase Elavil to 50 mg.     Leukocytosis  Erythrocytosis   - Patient has a history of elevated WBC, HGB, HCT. She does smoke   Plan   - Peripheral cytometry and Jak2 drawn today    Mental Health  Anxiety   - Currently on Klonopin 0.25 TID. She has a history of using various medications for anxiety, including Zoloft, Buspar and wellbutrin, which were not effective and she experienced side effects.  - Previously on xanax which she did not tolerate   Plan   - Patient is aware that controlled medications carry an increased risk of dependence and abuse as well as risk of overdose and withdrawal. These medications are to be taken as directed.     -  Referral to GI  - CT abdomen   - Keflex for right 4th finger   - Xray of right knee, crepitus   - Xray L. Hip  - Imitrex     Orders Placed This Encounter    CT ABDOMEN PELVIS WO IV CONTRAST    XR KNEE RIGHT 3 VIEW    XR HIP LEFT    Jak2, Exon 12 Mutation Analysis    HGA1C (HEMOGLOBIN A1C WITH EST AVG GLUCOSE)    CBC    COMPREHENSIVE METABOLIC PANEL, NON-FASTING    Refer to [facility]    POCT URINE DRUG SCREEN (AMB)    docusate sodium (COLACE) 100 mg Oral Capsule    amitriptyline (ELAVIL) 50 mg Oral Tablet    fluticasone propionate (FLONASE) 50 mcg/actuation Nasal Spray, Suspension    cephalexin (KEFLEX) 500 mg Oral Capsule    SUMAtriptan (IMITREX) 50 mg Oral Tablet    albuterol sulfate (VENTOLIN HFA) 90 mcg/actuation Inhalation oral inhaler    furosemide (LASIX) 20 mg Oral Tablet    FLOW CYTOMETRY, PERIPHERAL BLOOD       No follow-ups on file.    Garvin Fila, MA

## 2022-07-19 LAB — HGA1C (HEMOGLOBIN A1C WITH EST AVG GLUCOSE)
ESTIMATED AVERAGE GLUCOSE: 197 mg/dL
HEMOGLOBIN A1C: 8.5 % — ABNORMAL HIGH (ref 4.0–5.6)

## 2022-07-25 LAB — JAK2, EXON 12 MUTATION ANALYSIS: JAK2 EXON 12 MUTATION: NOT DETECTED

## 2022-07-26 ENCOUNTER — Other Ambulatory Visit: Payer: Self-pay

## 2022-07-29 ENCOUNTER — Other Ambulatory Visit (INDEPENDENT_AMBULATORY_CARE_PROVIDER_SITE_OTHER): Payer: Self-pay | Admitting: Internal Medicine

## 2022-07-29 MED ORDER — AMITRIPTYLINE 50 MG TABLET
50.0000 mg | ORAL_TABLET | Freq: Every evening | ORAL | 0 refills | Status: DC
Start: 2022-07-29 — End: 2022-12-26

## 2022-07-29 MED ORDER — GABAPENTIN 300 MG CAPSULE
ORAL_CAPSULE | ORAL | 0 refills | Status: DC
Start: 2022-07-29 — End: 2022-08-29

## 2022-07-29 MED ORDER — DICLOFENAC 1 % TOPICAL GEL
Freq: Four times a day (QID) | CUTANEOUS | 1 refills | Status: DC | PRN
Start: 2022-07-29 — End: 2022-08-12

## 2022-08-01 ENCOUNTER — Other Ambulatory Visit (INDEPENDENT_AMBULATORY_CARE_PROVIDER_SITE_OTHER): Payer: Self-pay | Admitting: Internal Medicine

## 2022-08-09 ENCOUNTER — Other Ambulatory Visit (INDEPENDENT_AMBULATORY_CARE_PROVIDER_SITE_OTHER): Payer: Self-pay | Admitting: Internal Medicine

## 2022-08-12 ENCOUNTER — Encounter (INDEPENDENT_AMBULATORY_CARE_PROVIDER_SITE_OTHER): Payer: Self-pay | Admitting: Internal Medicine

## 2022-08-12 ENCOUNTER — Telehealth (INDEPENDENT_AMBULATORY_CARE_PROVIDER_SITE_OTHER): Payer: MEDICAID | Admitting: Internal Medicine

## 2022-08-12 ENCOUNTER — Other Ambulatory Visit: Payer: Self-pay

## 2022-08-12 DIAGNOSIS — E119 Type 2 diabetes mellitus without complications: Secondary | ICD-10-CM

## 2022-08-12 DIAGNOSIS — R269 Unspecified abnormalities of gait and mobility: Secondary | ICD-10-CM

## 2022-08-12 DIAGNOSIS — G43909 Migraine, unspecified, not intractable, without status migrainosus: Secondary | ICD-10-CM

## 2022-08-12 DIAGNOSIS — R202 Paresthesia of skin: Secondary | ICD-10-CM

## 2022-08-12 DIAGNOSIS — F419 Anxiety disorder, unspecified: Secondary | ICD-10-CM

## 2022-08-12 DIAGNOSIS — H9319 Tinnitus, unspecified ear: Secondary | ICD-10-CM

## 2022-08-12 MED ORDER — FLUTICASONE PROPIONATE 50 MCG/ACTUATION NASAL SPRAY,SUSPENSION
2.0000 | Freq: Two times a day (BID) | NASAL | 0 refills | Status: DC
Start: 2022-08-12 — End: 2022-09-13

## 2022-08-12 MED ORDER — DICLOFENAC 1 % TOPICAL GEL
Freq: Four times a day (QID) | CUTANEOUS | 1 refills | Status: AC | PRN
Start: 2022-08-12 — End: 2022-09-11
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
## 2022-08-29 ENCOUNTER — Other Ambulatory Visit (INDEPENDENT_AMBULATORY_CARE_PROVIDER_SITE_OTHER): Payer: Self-pay | Admitting: Internal Medicine

## 2022-08-30 ENCOUNTER — Other Ambulatory Visit (INDEPENDENT_AMBULATORY_CARE_PROVIDER_SITE_OTHER): Payer: Self-pay | Admitting: Internal Medicine

## 2022-08-30 MED ORDER — GABAPENTIN 300 MG CAPSULE
ORAL_CAPSULE | ORAL | 0 refills | Status: DC
Start: 2022-08-30 — End: 2022-11-15

## 2022-09-01 ENCOUNTER — Encounter (HOSPITAL_COMMUNITY): Payer: Self-pay
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
## 2022-09-05 ENCOUNTER — Other Ambulatory Visit: Payer: Self-pay

## 2022-09-05 ENCOUNTER — Encounter (HOSPITAL_BASED_OUTPATIENT_CLINIC_OR_DEPARTMENT_OTHER): Payer: Self-pay | Admitting: NEUROLOGY

## 2022-09-05 ENCOUNTER — Ambulatory Visit: Payer: MEDICAID | Attending: Internal Medicine | Admitting: NEUROLOGY

## 2022-09-05 VITALS — BP 170/114 | HR 115 | Ht 65.0 in | Wt 290.0 lb

## 2022-09-05 DIAGNOSIS — M79601 Pain in right arm: Secondary | ICD-10-CM | POA: Insufficient documentation

## 2022-09-05 DIAGNOSIS — Z794 Long term (current) use of insulin: Secondary | ICD-10-CM | POA: Insufficient documentation

## 2022-09-05 DIAGNOSIS — R202 Paresthesia of skin: Secondary | ICD-10-CM | POA: Insufficient documentation

## 2022-09-05 DIAGNOSIS — M79602 Pain in left arm: Secondary | ICD-10-CM | POA: Insufficient documentation

## 2022-09-05 DIAGNOSIS — H538 Other visual disturbances: Secondary | ICD-10-CM | POA: Insufficient documentation

## 2022-09-05 DIAGNOSIS — R269 Unspecified abnormalities of gait and mobility: Secondary | ICD-10-CM | POA: Insufficient documentation

## 2022-09-05 DIAGNOSIS — H9319 Tinnitus, unspecified ear: Secondary | ICD-10-CM | POA: Insufficient documentation

## 2022-09-05 DIAGNOSIS — G4733 Obstructive sleep apnea (adult) (pediatric): Secondary | ICD-10-CM | POA: Insufficient documentation

## 2022-09-05 DIAGNOSIS — G43009 Migraine without aura, not intractable, without status migrainosus: Secondary | ICD-10-CM

## 2022-09-05 DIAGNOSIS — E1140 Type 2 diabetes mellitus with diabetic neuropathy, unspecified: Secondary | ICD-10-CM | POA: Insufficient documentation

## 2022-09-05 DIAGNOSIS — G43909 Migraine, unspecified, not intractable, without status migrainosus: Secondary | ICD-10-CM | POA: Insufficient documentation

## 2022-09-05 MED ORDER — AIMOVIG AUTOINJECTOR 140 MG/ML SUBCUTANEOUS AUTO-INJECTOR
140.0000 mg | AUTO-INJECTOR | SUBCUTANEOUS | 3 refills | Status: DC
Start: 2022-09-05 — End: 2022-12-26
  Filled 2022-09-05: qty 1, 30d supply, fill #0

## 2022-09-06 ENCOUNTER — Other Ambulatory Visit: Payer: Self-pay

## 2022-09-06 ENCOUNTER — Ambulatory Visit (INDEPENDENT_AMBULATORY_CARE_PROVIDER_SITE_OTHER): Payer: Self-pay | Admitting: NEUROLOGY

## 2022-09-06 NOTE — Telephone Encounter (Signed)
Prior authorization required for prescription Aimovig received by [facility] on 09/06/2022.  Benefits investigation to be completed.

## 2022-09-09 ENCOUNTER — Other Ambulatory Visit: Payer: Self-pay

## 2022-09-09 NOTE — Telephone Encounter (Signed)
Prior authorization for Aimovig submitted electronically on 09/09/2022 through CoverMyMeds. Key [id number]. Waiting for response from payor.  Jani Files, Pharmacy Technician

## 2022-09-12 ENCOUNTER — Other Ambulatory Visit: Payer: Self-pay

## 2022-09-12 NOTE — Telephone Encounter (Signed)
Per insurance they need an updated hit 6. Will send a msg to clinic and scan insurance form into media.

## 2022-09-13 ENCOUNTER — Other Ambulatory Visit (INDEPENDENT_AMBULATORY_CARE_PROVIDER_SITE_OTHER): Payer: Self-pay | Admitting: Internal Medicine

## 2022-09-13 MED ORDER — SUMATRIPTAN 50 MG TABLET
50.0000 mg | ORAL_TABLET | Freq: Once | ORAL | 0 refills | Status: DC | PRN
Start: 2022-09-13 — End: 2023-04-24

## 2022-09-13 MED ORDER — MELOXICAM 15 MG TABLET
15.0000 mg | ORAL_TABLET | Freq: Every day | ORAL | 0 refills | Status: DC
Start: 2022-09-13 — End: 2022-10-17

## 2022-09-13 MED ORDER — FLUTICASONE PROPIONATE 50 MCG/ACTUATION NASAL SPRAY,SUSPENSION
2.0000 | Freq: Two times a day (BID) | NASAL | 11 refills | Status: DC
Start: 2022-09-13 — End: 2023-09-06

## 2022-09-15 ENCOUNTER — Other Ambulatory Visit (HOSPITAL_COMMUNITY): Payer: MEDICAID

## 2022-09-15 ENCOUNTER — Other Ambulatory Visit: Payer: Self-pay

## 2022-09-15 NOTE — Telephone Encounter (Signed)
Hit-6 obtained by clinic, faxed information to insurance.

## 2022-09-16 NOTE — Telephone Encounter (Signed)
Received fax from [employer] they did not receive HIT6 score with last fax. Faxed through [facility]. KEY: [id number]    Jonah Blue, Pharmacy Technician

## 2022-09-19 ENCOUNTER — Other Ambulatory Visit: Payer: Self-pay

## 2022-09-19 NOTE — Telephone Encounter (Signed)
Specialty Pharmacy Note    Prior Authorization for medication Aimovig has been approved by payor [employer] until 12/17/22.  Approval notice has been scanned into Epic and can be found in the Media tab. Per the Patient's Choice the prescription will be sent to [facility] pharmacy.     No further action is needed from clinic    If you have any questions, don't hesitate to contact the Specialty Pharmacy Jonah Blue, Pharmacy Technician

## 2022-09-27 ENCOUNTER — Other Ambulatory Visit (HOSPITAL_COMMUNITY): Payer: MEDICAID

## 2022-09-27 ENCOUNTER — Ambulatory Visit (HOSPITAL_COMMUNITY): Payer: Self-pay

## 2022-10-03 ENCOUNTER — Ambulatory Visit (HOSPITAL_BASED_OUTPATIENT_CLINIC_OR_DEPARTMENT_OTHER): Payer: Self-pay | Admitting: Legal Medicine

## 2022-10-04 ENCOUNTER — Other Ambulatory Visit (INDEPENDENT_AMBULATORY_CARE_PROVIDER_SITE_OTHER): Payer: Self-pay | Admitting: Internal Medicine

## 2022-10-05 ENCOUNTER — Other Ambulatory Visit (INDEPENDENT_AMBULATORY_CARE_PROVIDER_SITE_OTHER): Payer: Self-pay | Admitting: Internal Medicine

## 2022-10-10 ENCOUNTER — Other Ambulatory Visit: Payer: Self-pay

## 2022-10-10 ENCOUNTER — Encounter (HOSPITAL_COMMUNITY): Payer: Self-pay

## 2022-10-10 ENCOUNTER — Other Ambulatory Visit (HOSPITAL_COMMUNITY): Payer: MEDICAID

## 2022-10-10 ENCOUNTER — Emergency Department (HOSPITAL_COMMUNITY): Payer: MEDICAID

## 2022-10-10 ENCOUNTER — Ambulatory Visit (HOSPITAL_BASED_OUTPATIENT_CLINIC_OR_DEPARTMENT_OTHER): Payer: MEDICAID | Admitting: FAMILY PRACTICE

## 2022-10-10 ENCOUNTER — Emergency Department
Admission: EM | Admit: 2022-10-10 | Discharge: 2022-10-10 | Disposition: A | Discharge status: Home / Self Care | Payer: MEDICAID | Source: Home / Self Care | Attending: Emergency Medicine | Admitting: Emergency Medicine

## 2022-10-10 ENCOUNTER — Encounter (HOSPITAL_BASED_OUTPATIENT_CLINIC_OR_DEPARTMENT_OTHER): Payer: Self-pay | Admitting: FAMILY PRACTICE

## 2022-10-10 VITALS — BP 146/78 | HR 80 | Temp 97.5°F | Resp 18 | Ht 65.0 in | Wt 290.0 lb

## 2022-10-10 DIAGNOSIS — E11622 Type 2 diabetes mellitus with other skin ulcer: Secondary | ICD-10-CM | POA: Insufficient documentation

## 2022-10-10 DIAGNOSIS — Z716 Tobacco abuse counseling: Secondary | ICD-10-CM | POA: Insufficient documentation

## 2022-10-10 DIAGNOSIS — S31829A Unspecified open wound of left buttock, initial encounter: Secondary | ICD-10-CM

## 2022-10-10 DIAGNOSIS — Z794 Long term (current) use of insulin: Secondary | ICD-10-CM | POA: Insufficient documentation

## 2022-10-10 DIAGNOSIS — S31819A Unspecified open wound of right buttock, initial encounter: Secondary | ICD-10-CM

## 2022-10-10 DIAGNOSIS — L98418 Non-pressure chronic ulcer of buttock with other specified severity: Secondary | ICD-10-CM | POA: Insufficient documentation

## 2022-10-10 DIAGNOSIS — R21 Rash and other nonspecific skin eruption: Secondary | ICD-10-CM | POA: Insufficient documentation

## 2022-10-10 DIAGNOSIS — F17210 Nicotine dependence, cigarettes, uncomplicated: Secondary | ICD-10-CM | POA: Insufficient documentation

## 2022-10-10 DIAGNOSIS — Z22322 Carrier or suspected carrier of Methicillin resistant Staphylococcus aureus: Secondary | ICD-10-CM

## 2022-10-10 HISTORY — DX: Carrier or suspected carrier of methicillin resistant Staphylococcus aureus: Z22.322

## 2022-10-10 LAB — CBC WITH DIFF
BASOPHIL #: 0.1 10*3/uL (ref <=0.20)
BASOPHIL %: 0.9 %
EOSINOPHIL #: 0.22 10*3/uL (ref <=0.50)
EOSINOPHIL %: 2 %
HCT: 52.7 % — ABNORMAL HIGH (ref 34.8–46.0)
HGB: 17.6 g/dL — ABNORMAL HIGH (ref 11.5–16.0)
IMMATURE GRANULOCYTE #: <0.1 10*3/uL (ref <0.10)
IMMATURE GRANULOCYTE %: 0.4 % (ref 0.0–1.0)
LYMPHOCYTE #: 3.76 10*3/uL (ref 1.00–4.80)
LYMPHOCYTE %: 34.7 %
MCH: 32 pg (ref 26.0–32.0)
MCHC: 33.4 g/dL (ref 31.0–35.5)
MCV: 95.8 fL (ref 78.0–100.0)
MONOCYTE #: 0.49 10*3/uL (ref 0.20–1.10)
MONOCYTE %: 4.5 %
MPV: 11 fL (ref 8.7–12.5)
NEUTROPHIL #: 6.23 10*3/uL (ref 1.50–7.70)
NEUTROPHIL %: 57.5 %
PLATELETS: 208 10*3/uL (ref 150–400)
RBC: 5.5 10*6/uL — ABNORMAL HIGH (ref 3.85–5.22)
RDW-CV: 14.1 % (ref 11.5–15.5)
WBC: 10.8 10*3/uL (ref 3.7–11.0)

## 2022-10-10 LAB — BASIC METABOLIC PANEL
ANION GAP: 10 mmol/L (ref 4–13)
BUN/CREA RATIO: 8 (ref 6–22)
BUN: 7 mg/dL — ABNORMAL LOW (ref 8–25)
CALCIUM: 9.2 mg/dL (ref 8.6–10.2)
CHLORIDE: 98 mmol/L (ref 96–111)
CO2 TOTAL: 22 mmol/L (ref 22–30)
CREATININE: 0.87 mg/dL (ref 0.60–1.05)
ESTIMATED GFR - FEMALE: 84 mL/min/BSA (ref >=60)
GLUCOSE: 362 mg/dL — ABNORMAL HIGH (ref 65–125)
POTASSIUM: 4.3 mmol/L (ref 3.5–5.1)
SODIUM: 130 mmol/L — ABNORMAL LOW (ref 136–145)

## 2022-10-10 LAB — PT/INR: INR: 1.04 (ref 0.80–1.10)

## 2022-10-10 LAB — C-REACTIVE PROTEIN(CRP),INFLAMMATION: CRP INFLAMMATION: 11.1 mg/L — ABNORMAL HIGH (ref <8.0)

## 2022-10-10 LAB — SEDIMENTATION RATE: ERYTHROCYTE SEDIMENTATION RATE (ESR): 8 mm/hr (ref 0–20)

## 2022-10-10 LAB — LAVENDER TOP TUBE

## 2022-10-10 MED ORDER — METHYLPREDNISOLONE SOD SUCC 125 MG SOLUTION FOR INJECTION WRAPPER
125.0000 mg | INTRAVENOUS | Status: AC
Start: 2022-10-10 — End: 2022-10-10
  Administered 2022-10-10: 125 mg via INTRAVENOUS
  Filled 2022-10-10: qty 2

## 2022-10-10 MED ORDER — LIDOCAINE HCL 2 % MUCOSAL JELLY
5.0000 mL | Freq: Once | 0 refills | Status: AC
Start: 2022-10-10 — End: 2022-10-10

## 2022-10-10 MED ORDER — PREDNISONE 20 MG TABLET
40.0000 mg | ORAL_TABLET | Freq: Every day | ORAL | 0 refills | Status: AC
Start: 2022-10-10 — End: 2022-10-15

## 2022-10-10 NOTE — Nursing Note (Signed)
10/10/22 0800   Wound  Other (comment) Right Buttock   Initial Date of Wound Assessment/Initial Time of Wound Assessment: 10/10/22 0847   Present on Original Admission: No  Wound Approximate Age at First Assessment (Weeks): 3 weeks  Primary Wound Type: (c) Other (comment)  Wound Number [facility]): 1  O...   LDAW Image    Dressing Status Removed   Wound Thickness Full Thickness   State of Healing Non-healing   Periwound Scarring;Induration   Wound Edges Flat and Intact   Wound Bed granulation;fibrin   Wound Length (cm) 0.8 cm   Wound Width (cm) 0.9 cm   Wound Depth (cm) 0.2 cm   Wound Volume (cm^3) 0.144 cm^3   Wound Surface Area (cm^2) 0.72 cm^2   Wound Undermining No   Wound Tunneling  No   Wound Drainage Amount Moderate   Exudate/Drainage Serosanguineous   Wound Odor Absent   Wound  Other (comment) Left Buttock   Initial Date of Wound Assessment/Initial Time of Wound Assessment: 10/10/22 0849   Present on Original Admission: No  Wound Approximate Age at First Assessment (Weeks): 3 weeks  Primary Wound Type: (c) Other (comment)  Wound Number (Care Center): 2  O...   LDAW Image    Dressing Status   (open to air)   Wound Thickness Full Thickness   State of Healing Non-healing   Periwound Induration   Wound Edges Flat and Intact   Wound Bed granulation;fibrin   Wound Bed Additional Information 76%-100% fibrin;0%-25% granulation   Wound Length (cm) 1 cm   Wound Width (cm) 0.8 cm   Wound Depth (cm) 0.1 cm   Wound Volume (cm^3) 0.08 cm^3   Wound Surface Area (cm^2) 0.8 cm^2   Wound Undermining No   Wound Tunneling  No   Wound Drainage Amount Small   Exudate/Drainage Serosanguineous   Wound Odor Absent

## 2022-10-10 NOTE — H&P (Signed)
WOUND CARE, WOUND CARE CENTER  [address] [place] [address]    Progress Note    Patient Name: Yvette Schmidt   MRN:  [id number]   DOB: 1975-04-17   Date of Service:  10/10/2022     PCP: Leroy Kennedy, MD  Referring provider: Self, Referral  Information Provided by: Patient  Chief Complaint:  Wound    History of Present Illness:   Patient is a 47 y.o. White female who presents for a new evaluation of bilateral open wounds of the buttocks. The patient has history of bilateral abscess on the right and left buttocks.  The have ruptured and a lot of purulent material was released.  She has been to the [facility] ER in [place], diagnosed with cellulitis and given 5 days of keflex.  She wasn't improving, so her PCP called in Bactrim for her and she is still taking it.  No cultures have been performed so far on the open wounds.  Now, she has multiple purpuric lesions on both of her lower legs.  This has been getting worse over the past week, maybe 10 days.  The lesions look characteristic of a vasculitis, or platelet dysfunction.  She has not been to any provider for blood work or other testing to determine what is causing this, so I have referred her to go to the emergency department to have it evaluated.  The ulcers on her buttocks are improving.  She is using some dressings that she had from a previous wound care visit.    No change in PMH, PSH, Meds, Allergies or FHx since last visit.    Past Medical/Family/Social History:    PMH/PSH:  Past Medical History:   Diagnosis Date    Asthma     COPD (chronic obstructive pulmonary disease) (CMS HCC)     Diabetes mellitus, type 2 (CMS HCC)     GERD (gastroesophageal reflux disease)     Hypertension            Medications:  Current Outpatient Medications   Medication Sig    ACCU-CHEK GUIDE GLUCOSE METER Does not apply Misc     ACCU-CHEK SOFTCLIX LANCETS Misc USE TO TEST BLOOD SUGAR FOUR TIMES DAILY BEFORE MEALS AND AT BEDTIME    albuterol sulfate (PROVENTIL)  2.5 mg /3 mL (0.083 %) Inhalation nebulizer solution USE 1 VIAL VIA NEBULIZER FOUR TIMES DAILY    albuterol sulfate (VENTOLIN HFA) 90 mcg/actuation Inhalation oral inhaler INHALE 1-2 PUFFS BY INHALATION EVERY 6 HOURS AS NEEDED    amitriptyline (ELAVIL) 50 mg Oral Tablet Take 1 Tablet (50 mg total) by mouth Every night for 90 days    azelastine (ASTELIN) 137 mcg (0.1 %) Nasal Spray, Non-Aerosol ADMINISTER 1 SPRAY INTO EACH NOSTRIL TWICE DAILY FOR 30 DAYS    budesonide-formoteroL (SYMBICORT) 160-4.5 mcg/actuation Inhalation oral inhaler Take 2 Puffs by inhalation Twice daily for 30 days    clonazePAM (KLONOPIN) 0.5 mg Oral Tablet TAKE 0.5 (1/2) TABLETS (0.25 MG TOTAL) BY MOUTH THREE TIMES A DAY FOR 30 DAYS    docusate sodium (COLACE) 100 mg Oral Capsule Take 1 Capsule (100 mg total) by mouth Twice per day as needed for Constipation for up to 90 days    doxycycline hyclate (VIBRAMYCIN) 100 mg Oral Capsule Take 1 Capsule (100 mg total) by mouth Twice daily for 10 days    EPINEPHrine 0.3 mg/0.3 mL Injection Auto-Injector Inject 0.3 mL (0.3 mg total) into the muscle Once, as needed for  up to 2 doses    erenumab-aooe (AIMOVIG AUTOINJECTOR) 140 mg/mL Subcutaneous Auto-Injector Inject 1 pen (140 mg total) subcutaneously (under the skin) Every 30 days    fluticasone propionate (FLONASE) 50 mcg/actuation Nasal Spray, Suspension Administer 2 Sprays into each nostril Twice daily    furosemide (LASIX) 20 mg Oral Tablet Take 1 Tablet (20 mg total) by mouth Once per day as needed for up to 90 days    gabapentin (NEURONTIN) 300 mg Oral Capsule TAKE 1 CAPSULE IN THE MORNING, 1 CAPSULE IN THE AFTERNOON AND 2 CAPSULES IN THE EVENING    insulin aspart U-100 (NOVOLOG) 100 unit/mL (3 mL) Subcutaneous Insulin Pen INJECT UNDER THE SKIN TWICE A DAY BEFORE MEALS PER SLIDING SCALE <150 0 150-200 2 UNITS 201-250 4 UNITS 251-300 6 UNITS 301-350 9 UNITS 351-400 12 UNITS    Insulin Needles, Disposable, (SURE COMFORT PEN NEEDLE) 32 gauge x 1/4"  Needle USE TO INJECT INSULIN TWICE DAILY    ipratropium (ATROVENT) 0.02 % Inhalation Solution TAKE 3 ML BY NEBULIZATION FOUR TIMES A DAY    meloxicam (MOBIC) 15 mg Oral Tablet Take 1 Tablet (15 mg total) by mouth Once a day for 30 days    Nebulizers Misc Use with Nebulizer medication    pantoprazole (PROTONIX) 40 mg Oral Tablet, Delayed Release (E.C.) TAKE 1 TABLET (40 MG TOTAL) BY MOUTH TWICE DAILY FOR 90 DAYS    predniSONE (DELTASONE) 20 mg Oral Tablet Take 2 Tablets (40 mg total) by mouth Once a day for 5 days    rosuvastatin (CRESTOR) 20 mg Oral Tablet Take 1 Tablet (20 mg total) by mouth Every evening    SUMAtriptan (IMITREX) 50 mg Oral Tablet Take 1 Tablet (50 mg total) by mouth Once, as needed for Migraine for up to 30 days May repeat in 2 hours in needed       Allergies:  Allergies   Allergen Reactions    Beeswax      Allergic to Bee Stings    Clindamycin     Hydrocodone     Other      Nutrasweet, sugar subs    Sulfa (Sulfonamides)      HIVES    Theophylline      ANXIETY       Family History:  Family Medical History:    None           Social History:  Social History     Tobacco Use    Smoking status: Every Day     Current packs/day: 0.50     Average packs/day: 0.5 packs/day for 15.0 years (7.5 ttl pk-yrs)     Types: Cigarettes    Smokeless tobacco: Never   Substance Use Topics    Alcohol use: Not Currently     Comment: seldom     Smoking cessation counseling this appointment: .yes 2 minutes on smoking cessation    Review of Systems :    Constitutional: negative for fevers, chills,fatigue, and weight loss  Respiratory: negative for cough, wheezing  Cardiovascular: negative for chest pressure/discomfort, negative claudication, positive lower extremity edema  Gastrointestinal: negative for dysphagia, nausea, vomiting, diarrhea, decrease in appetite   Integumentary: negative for rash, changes in skin color, dryness; + skin lesion per photo  Musculoskeletal: negative for myalgias, muscle weakness, joint  pain  Neurological: negative for dizziness, gait/coordination problems, negative paresthesia  All other systems negative.    Physical Exam:    BP (!) 146/78   Pulse 80   Temp 36.4  C (97.5 F)   Resp 18   Ht 1.651 m (5\' 5" )   Wt 132 kg (290 lb)   BMI 48.26 kg/m        Constitutional: BMI 48.26, in declining general health,  ambulatory appears  in no acute distress  Eyes: PERRL, conjunctiva non-icteric  HENT: mucous membranes moist  Respiratory: non-labored on room air  Cardiovascular: bilateral non-pitting lower leg edema, ++ bilateral dorsal pedis pulses, absent cyanosis,   Neurologic: A&Ox3; grossly intact; sensation diminished in the feet  Musculoskeletal: Equal strength to bilateral upper/lower extremities, head normocephalic   Psychiatric: Normal affect; cooperative with exam  Integumentary: see wound chart and photo, bilateral lower leg purpuric lesions , no  hemosiderin staining noted, no erythema, no xerosis, no lipodermatosclerosis, no s/s of infection; see detailed wound charting below:      Neurovascular Assessment:Noted above      WOUND ASSESSMENT     Nursing Notes:   Anabel Halon, RN  10/10/22 0865  Signed     10/10/22 0800   Wound  Other (comment) Right Buttock   Initial Date of Wound Assessment/Initial Time of Wound Assessment: 10/10/22 0847   Present on Original Admission: No  Wound Approximate Age at First Assessment (Weeks): 3 weeks  Primary Wound Type: (c) Other (comment)  Wound Number (Care Center): 1  O...   LDAW Image    Dressing Status Removed   Wound Thickness Full Thickness   State of Healing Non-healing   Periwound Scarring;Induration   Wound Edges Flat and Intact   Wound Bed granulation;fibrin   Wound Length (cm) 0.8 cm   Wound Width (cm) 0.9 cm   Wound Depth (cm) 0.2 cm   Wound Volume (cm^3) 0.144 cm^3   Wound Surface Area (cm^2) 0.72 cm^2   Wound Undermining No   Wound Tunneling  No   Wound Drainage Amount Moderate   Exudate/Drainage Serosanguineous   Wound Odor Absent   Wound   Other (comment) Left Buttock   Initial Date of Wound Assessment/Initial Time of Wound Assessment: 10/10/22 0849   Present on Original Admission: No  Wound Approximate Age at First Assessment (Weeks): 3 weeks  Primary Wound Type: (c) Other (comment)  Wound Number (Care Center): 2  O...   LDAW Image    Dressing Status   (open to air)   Wound Thickness Full Thickness   State of Healing Non-healing   Periwound Induration   Wound Edges Flat and Intact   Wound Bed granulation;fibrin   Wound Bed Additional Information 76%-100% fibrin;0%-25% granulation   Wound Length (cm) 1 cm   Wound Width (cm) 0.8 cm   Wound Depth (cm) 0.1 cm   Wound Volume (cm^3) 0.08 cm^3   Wound Surface Area (cm^2) 0.8 cm^2   Wound Undermining No   Wound Tunneling  No   Wound Drainage Amount Small   Exudate/Drainage Serosanguineous   Wound Odor Absent         Pearline Cables A  10/10/22 7846  Signed     10/10/22 0900   OTHER   Cleansed with saline   Primary dressing medihoney   Secondary dressing border   Location right and left buttock   Other obtained culture                               Data / Labs / Imaging Studies:      Previous progress notes and lab results reviewed up to date  in medical chart.     Labs: Following reviewed and discussed with patient  ALBUMIN (g/dL )   Date Value   54/09/8117 3.3 (L)   04/14/2022 3.4 (L)     No results found for: "PREALBUMIN"  HEMOGLOBIN A1C (%)   Date Value   07/18/2022 8.5 (H)   04/14/2022 6.9 (H)           Assessment / Plan:     ENCOUNTER DIAGNOSES     ICD-10-CM   1. Open wound of right buttock, initial encounter  S31.819A   2. Open wound of left buttock, initial encounter  S31.829A   Cultures obtained x 2.      Off-loading:NA      Dressing:Medihoney, border dressings      Compression Wraps:NA        - Continue prescribed medication regimen as directed by PCP. Medical optimization of diabetes with PCP.     - Counseled patient on importance of nutrition to support granulation tissue growth and wound      healing  --Instructed patient on foods high in Vitamin A, Vitamin C and protein      -Instructed patient if diabetic on the importance of tight glycemic control with a goal HgbA1C < 7.0    - RTC in 1 week or sooner if needed. May call with any additional questions/concerns prior to next appointment. Patient instructed to call or report to Emergency Department if fever develops, wound increases in size, wound drainage increases, pain is uncontrolled, edema increases or erythema develops.      Vernell Barrier, MD 10/13/2022, 13:53    Wound Care    This note was partially generated using MModal Fluency Direct System.There maybe some incorrect words, spelling, and punctuation that were not noted in checking the note before saving.

## 2022-10-10 NOTE — ED Provider Notes (Signed)
Department of Emergency Medicine  HPI - 10/10/2022    Attending: Dr. Omar Person  Chief Complaint:  Chief Complaint   Patient presents with    Rash     rash all over, pain all over, sent by PCP     History of Present Illness:  Yvette Schmidt is a 47 y.o. female with a PMH of DM2, HTN, asthma, COPD, GERD, chronic migraines, sleep apnea, and unspecified seizure disorder who presents via POV with a chief complaint of rash. Patient reports approximately x3 weeks ago she noticed what appeared to be a boil on her buttock which later ruptures. Patient states shortly after she began developing a diffuse red rash to the BLE. She states she has been following with wound care secondary to her boil, and notes discussing her rash there where concern was noted for possible vasculitis. Patient states she was seen at [facility] for this on 9/9, and at that time was diagnosed with cellulitis for which she was prescribed a 5 day course of Keflex, and 10 day course of Cipro without any improvement in her rash. She reports she is now having hot flashes, all over joint pain, and states she feels this is causing an exacerbation in her neuropathy noting increased numbness/pins & needles, as well as a cold sensation to her toes. Patient states given her worsening symptoms she was advised by wound care to present to the ED for further evaluation. Patient reports she has not been prescribed any steroids for this thus far, but states she has had these many times in the past, and is aware of close monitoring of her glucose given her hx of DM. Patient denies any measures fevers, new exposures, and all other complaints at this time.       History Limitations: None.     Review of Systems:  Pertinent positive or negative ROS reflected in HPI      Allergies:  Allergies   Allergen Reactions    Beeswax      Allergic to Bee Stings    Clindamycin     Hydrocodone     Other      Nutrasweet, sugar subs    Sulfa (Sulfonamides)      HIVES    Theophylline       ANXIETY     Past Medical History:  Past Medical History:   Diagnosis Date    Asthma     COPD (chronic obstructive pulmonary disease) (CMS HCC)     Diabetes mellitus, type 2 (CMS HCC)     GERD (gastroesophageal reflux disease)     Hypertension          Past Surgical History:  Past Surgical History:   Procedure Laterality Date    HX HYSTERECTOMY  2012    Left ooperectomy, R. ovary retained    HX LAP CHOLECYSTECTOMY  05/12/2017    PB COLONOSCOPY,DIAGNOSTIC           Social History:  Social History     Tobacco Use    Smoking status: Every Day     Current packs/day: 0.50     Average packs/day: 0.5 packs/day for 15.0 years (7.5 ttl pk-yrs)     Types: Cigarettes    Smokeless tobacco: Never   Substance Use Topics    Alcohol use: Not Currently     Comment: seldom     Family History:  Family Medical History:    None         Physical Exam:  All  nurse's notes reviewed.  Filed Vitals:    10/10/22 1002   BP: (!) 134/95   Pulse: (!) 105   Resp: 20   Temp: 36.3 C (97.3 F)   SpO2: 95%      Constitutional: No acute distress.  Alert and Oriented x3.  HENT:   Head: Normocephalic, Atraumatic  Mouth/Throat: Oropharynx is clear and moist.  Eyes:Conjunctivae without discharge.  Neck: Trachea midline.   Cardiovascular: Regular rate and rhythm. No murmur  Pulmonary/Chest:  No respiratory distress. Lungs clear. No wheezing or rhonchi.  Abdominal:  Abdomen soft, obese, no tenderness, rebound or guarding.  Musculoskeletal: No obvious deformity, swelling. 2+ peripheral pulses  Skin: Discrete non blanching crusted over, erythematous lesions to the BLE that are slightly raised. No surrounding cellulitis. Warm and dry. No pallor or cyanosis.  Psychiatric: Anxious appearing. Mood and affect congruent.  Neurological: Alert & Oriented x3. Grossly intact. Moves all extremities spontaneously.    Orders, Abnormal Labs and Imaging Results:  Results up to the Time the Disposition was Entered   CBC WITH DIFF - Abnormal; Notable for the following  components:       Result Value    RBC 5.50 (*)     HGB 17.6 (*)     HCT 52.7 (*)     All other components within normal limits   BASIC METABOLIC PANEL - Abnormal; Notable for the following components:    SODIUM 130 (*)     GLUCOSE 362 (*)     BUN 7 (*)     All other components within normal limits   C-REACTIVE PROTEIN(CRP),INFLAMMATION - Abnormal; Notable for the following components:    CRP INFLAMMATION 11.1 (*)     All other components within normal limits   SEDIMENTATION RATE - Normal   PT/INR - Normal    Narrative:     Coumadin therapy INR range for Conventional Anticoagulation is 2.0 to 3.0 and for Intensive Anticoagulation 2.5 to 3.5.   CBC/DIFF    Narrative:     The following orders were created for panel order CBC/DIFF.  Procedure                               Abnormality         Status                     ---------                               -----------         ------                     CBC WITH VWUJ[[id number]]                Abnormal            Final result                 Please view results for these tests on the individual orders.   LAVENDER TOP TUBE   methylPREDNISolone sod succ (SOLU-medrol) 125 mg/2 mL injection (125 mg Intravenous Given 10/10/22 1047)       EKG: none  MDM:   ED Course as of 10/10/22 1230   Mon Oct 10, 2022   1036 Patient with rash on bilateral lower legs which may be related to vasculitis.  Less  likely drug rash.  No report of any recent change in her medications over the past several weeks.  The antibiotics that she has been put on do not seem to be helping.  Lower suspicion for cellulitis.  I suspect she may need a course of steroids for symptom relief.  I will order labs some of which may not return right away.  She has good follow up however.       Critical care time: none  Consults: none  Impression:   Clinical Impression   Rash (Primary)     Disposition:  Discharged    Discharge: Following the above history, physical exam, and studies, the patient was deemed stable and  suitable for discharge.  It was advised that the patient return to the ED if they develop new or any other concerning symptoms and follow up as directed.     Follow Up:   Dermatology, [facility] -  [address]  [address]  [place] [address]  [phone]    Schedule an appointment as soon as possible for a visit       Prescriptions:   Current Discharge Medication List        START taking these medications    Details   predniSONE (DELTASONE) 20 mg Oral Tablet Take 2 Tablets (40 mg total) by mouth Once a day for 5 days  Qty: 10 Tablet, Refills: 0                Future Appointments   Date Time Provider Department Center   10/17/2022  8:45 AM Vernell Barrier, MD [facility] [place]   10/21/2022 11:30 AM [facility] [place] [facility]   03/07/2023  9:00 AM Lesleigh Noe, MD [facility]       This note may have been partially generated using MModal Fluency Direct system, and there may be some incorrect words, spellings, and punctuation that were not noted in checking the note before saving.    I am scribing for, and in the presence of, Dr. Antonietta Jewel, MD for services provided on 10/10/2022.  Doretha Sou, SCRIBE   Doretha Sou, SCRIBE  10/10/2022, 10:24.    I personally performed the services described in this documentation, as scribed  in my presence, and it is both accurate  and complete.    Tamala Bari, MD

## 2022-10-10 NOTE — ED Nurses Note (Signed)
MD was made aware that patient wants an answer today and is requesting we call a dermatologist to come see her now in the emergency department. Pt informed that we cannot do that and she must follow up in the outpatient clinic. Pt left department with discharge paperwork.

## 2022-10-10 NOTE — Nursing Note (Signed)
10/10/22 0900   OTHER   Cleansed with saline   Primary dressing medihoney   Secondary dressing border   Location right and left buttock   Other obtained culture

## 2022-10-10 NOTE — ED Nurses Note (Addendum)
Nurse discussed discharge paperwork with patient including steroid prescription and referral to dermatology. Pt became tearful and stated that she wants an answer today about what this rash is. MD made aware, charge nurse made aware. Pt also stating she needs to leave. She has her paperwork and is free to go at any time. She wants to speak to the doctor.

## 2022-10-10 NOTE — Progress Notes (Deleted)
[facility]  [address]  [place] [address]  [phone]    Wound Care Progress Note      Date of Service: 10/10/2022  Yvette Schmidt, Yvette Schmidt, 47 y.o., White female  Date of Birth:  Mar 08, 1975  MRN: [id number]  PCP: Leroy Kennedy, MD  Referring provider: Referral Self  Information Obtained from: {HISTORY PROVIDED BY:19225::"patient"}    Reason for visit:   Chief Complaint   Patient presents with    Wound       HPI:  Past medical history, past surgical history, social history and family history have been reviewed.    Social History     Tobacco Use   Smoking Status Every Day    Current packs/day: 0.50    Average packs/day: 0.5 packs/day for 15.0 years (7.5 ttl pk-yrs)    Types: Cigarettes   Smokeless Tobacco Never     Patient uses tobacco products: {Patient smokes yes/no:27204}    Examination  Vitals:  Temperature: 36.4 C (97.5 F) (10/10/22 0845)  Heart Rate: 80 (10/10/22 0845)  Respiratory Rate: 18 (10/10/22 0845)  BP (Non-Invasive): (!) 146/78 (10/10/22 0845)  Height: 165.1 cm (5\' 5" )/Weight: 132 kg (290 lb)      DEBRIDEMENT   {PERFORMED/NOT PERFORMED ([facility]):50024756}     PROCEDURES  {PERFORMED/NOT PERFORMED PROCEDURES ([facility]):50024758}    Infection status:   {INFECTION STATUS ([facility]):50023752}    Neuro/Vascular Assessment  ***    Nursing Notes:   Anabel Halon, RN  10/10/22 0630  Signed     10/10/22 0800   Wound  Other (comment) Right Buttock   Initial Date of Wound Assessment/Initial Time of Wound Assessment: 10/10/22 0847   Present on Original Admission: No  Wound Approximate Age at First Assessment (Weeks): 3 weeks  Primary Wound Type: (c) Other (comment)  Wound Number (Care Center): 1  O...   LDAW Image    Dressing Status Removed   Wound Thickness Full Thickness   State of Healing Non-healing   Periwound Scarring;Induration   Wound Edges Flat and Intact   Wound Bed granulation;fibrin   Wound Length (cm) 0.8 cm   Wound Width (cm) 0.9 cm   Wound Depth (cm) 0.2 cm   Wound Volume  (cm^3) 0.144 cm^3   Wound Surface Area (cm^2) 0.72 cm^2   Wound Undermining No   Wound Tunneling  No   Wound Drainage Amount Moderate   Exudate/Drainage Serosanguineous   Wound Odor Absent   Wound  Other (comment) Left Buttock   Initial Date of Wound Assessment/Initial Time of Wound Assessment: 10/10/22 0849   Present on Original Admission: No  Wound Approximate Age at First Assessment (Weeks): 3 weeks  Primary Wound Type: (c) Other (comment)  Wound Number (Care Center): 2  O...   LDAW Image    Dressing Status   (open to air)   Wound Thickness Full Thickness   State of Healing Non-healing   Periwound Induration   Wound Edges Flat and Intact   Wound Bed granulation;fibrin   Wound Bed Additional Information 76%-100% fibrin;0%-25% granulation   Wound Length (cm) 1 cm   Wound Width (cm) 0.8 cm   Wound Depth (cm) 0.1 cm   Wound Volume (cm^3) 0.08 cm^3   Wound Surface Area (cm^2) 0.8 cm^2   Wound Undermining No   Wound Tunneling  No   Wound Drainage Amount Small   Exudate/Drainage Serosanguineous   Wound Odor Absent         [name]  10/10/22  4540  Signed     10/10/22 0900   OTHER   Cleansed with saline   Primary dressing medihoney   Secondary dressing border   Location right and left buttock   Other obtained culture           Nutritional Assessment:    Recent Labs     07/18/22  1437   ALBUMIN 3.3*     {NUTRITIONAL STATUS:50024572}     Diabetes Assessment:  No results found for: "GLUCPOCT", "GLUIP"]  Lab Results   Component Value Date    HA1C 8.5 (H) 07/18/2022     {DIABETES:50024574}    Drugs affecting wound  {DRUGS/WOUND ([facility]):50024754}    Social support:  {SOCIAL JWJXBJY:78295621}    ASSESSMENT/PLAN    ENCOUNTER DIAGNOSES     ICD-10-CM   1. Open wound of right buttock, initial encounter  S31.819A   2. Open wound of left buttock, initial encounter  S31.829A       Offloading status/Pressure Reduction:    {OFFLOADING/PRESSURE REDUCTION:50024755}    Edema control:    {EDEMA CONTROL:50024571}    F/U:  Follow up  with physician in 1 week.      Vernell Barrier, MD 10/10/2022 13:20

## 2022-10-10 NOTE — Patient Instructions (Signed)
INFECTION    Your skin protects the inside of your body from germs that cannot be seen. Germs can cause infection if you have a break in your skin or a wound. You can also get an infection in your bone from an open wound. This is called osteomyelitis.     Anyone Can Get an Infection:  ~People with diabetes  ~Children having a growth spurt  ~People with open wounds  ~People with a weak immune system    A wound or a bone infection can be hard to treat. It may require a stay in the hospital. It could lead to an amputation. It could lead to death. It is important to know the signs and symptoms of an infection. It is also important for you to do everything you can to prevent an infection.     What Are the Signs and Symptoms of a Wound or Bone Infection?  ~Increasing redness around the wound  ~Warm or red skin  ~A red streak spreading from the wound  ~An increase in wound drainage  ~Pus or cloudy fluid draining form the wound  ~A foul odor or abnormal smell  ~Fever or chills  ~Increased pain  ~Tenderness at or in the area of the infected bone  ~Increased swelling around the wound  ~The wound has blisters, black or brown dead tissue  ~Increased weakness  ~A wound that won't heal    How Will the Doctor Know if I Have a Wound or Bone Infection?  Your doctor will review your medical history and examine you.    Your doctor may perform the following tests:  ~Cultures of the blood  ~Cultures of the wound  ~Cultures of the bone  ~X-rays, MRI, or CAT scans  ~Biopsy    How is a Wound or Bone Infection Treated?  After finding the cause of the infection, antibiotics (by IV or by mouth) will be ordered. The infected bone may have to be removed in surgery. You may have to keep that part of the body still and not move. You may need to keep that part of the body raised. Sometimes Hyperbaric Oxygen Therapy is used to treat the infection. Vitamins are often recommended as well as eating a healthy diet. These things can all help you to  heal.     What Can You Do to Decrease You Chances of Getting a Wound or Bone Infection?  Before you, a family member, or a friend do anything with your wound:  ~Wash your hands for 20 seconds from the wrist down with water and antibacterial soap.  ~Rinse well.  ~Dry your hands together quickly for 20 seconds.  ~Only touching your dressing with clean just washed hands.  ~Use gloves if you have them.  ~Keep urine, stool, and other body drainage away from the wound.   ~Keep your wound away from animal feces, urine and hair.    Wound or Bone Infection?  Do not sneeze or cough on the dressing supplies or on your wound.   ~Keep your wound covered with a dressing at all times. The dressing protects the wound from dirt and bacteria, controls the drainage and keeps the medications on the wound.  ~Choose a clean well lit place with a washable surface to do your dressing changes.  ~Keep your dressing supplies away from the heat in a clean closed container. You can use a plastic box with a lid and keep the box off the floor.  ~Throw your old used dressing  away. Wrap them in two plastic bads and tie it tightly. Throw them away in a closed trash can.    If You Think You Have an Infection:  ~Contact your doctors during regular business hours at [phone].  ~Go to your local emergency room or urgent care after regular business hours.

## 2022-10-13 DIAGNOSIS — R21 Rash and other nonspecific skin eruption: Secondary | ICD-10-CM | POA: Insufficient documentation

## 2022-10-13 LAB — WOUND, SUPERFICIAL/NON-STERILE SITE, AEROBIC CULTURE AND GRAM STAIN: GRAM STAIN: NONE SEEN

## 2022-10-13 MED ORDER — DOXYCYCLINE HYCLATE 100 MG CAPSULE
100.0000 mg | ORAL_CAPSULE | Freq: Two times a day (BID) | ORAL | 0 refills | Status: AC
Start: 2022-10-13 — End: 2022-10-23

## 2022-10-14 LAB — ANAEROBIC CULTURE: ANAEROBIC CULTURE: NO GROWTH

## 2022-10-17 ENCOUNTER — Other Ambulatory Visit (INDEPENDENT_AMBULATORY_CARE_PROVIDER_SITE_OTHER): Payer: Self-pay | Admitting: Internal Medicine

## 2022-10-17 ENCOUNTER — Encounter (HOSPITAL_BASED_OUTPATIENT_CLINIC_OR_DEPARTMENT_OTHER): Payer: Self-pay | Admitting: FAMILY PRACTICE

## 2022-10-17 ENCOUNTER — Other Ambulatory Visit: Payer: Self-pay

## 2022-10-17 ENCOUNTER — Encounter (HOSPITAL_COMMUNITY): Payer: Self-pay

## 2022-10-17 ENCOUNTER — Ambulatory Visit: Payer: MEDICAID | Attending: FAMILY PRACTICE | Admitting: FAMILY PRACTICE

## 2022-10-17 VITALS — BP 130/80 | HR 86 | Temp 97.2°F | Resp 18

## 2022-10-17 DIAGNOSIS — S31819A Unspecified open wound of right buttock, initial encounter: Secondary | ICD-10-CM | POA: Insufficient documentation

## 2022-10-17 DIAGNOSIS — S31829A Unspecified open wound of left buttock, initial encounter: Secondary | ICD-10-CM | POA: Insufficient documentation

## 2022-10-17 DIAGNOSIS — S31829D Unspecified open wound of left buttock, subsequent encounter: Secondary | ICD-10-CM

## 2022-10-17 DIAGNOSIS — S31819D Unspecified open wound of right buttock, subsequent encounter: Secondary | ICD-10-CM

## 2022-10-17 DIAGNOSIS — A4902 Methicillin resistant Staphylococcus aureus infection, unspecified site: Secondary | ICD-10-CM | POA: Insufficient documentation

## 2022-10-17 DIAGNOSIS — F17210 Nicotine dependence, cigarettes, uncomplicated: Secondary | ICD-10-CM | POA: Insufficient documentation

## 2022-10-17 MED ORDER — LIDOCAINE 2 % MUCOSAL JELLY IN APPLICATOR
6.0000 mL | Freq: Once | 0 refills | Status: AC
Start: 2022-10-17 — End: 2022-10-17

## 2022-10-17 NOTE — Patient Instructions (Signed)
.Caring For Your Ulcers  Healthy skin is a natural barrier that prevents infection. A break in your skin makes it possible for germs to enter your body. Covering your wound will help to keep it clean and heal faster.    Your Nurse Will Show You:  ~How to take care of your dressing  ~When and how to change your dressing  ~What to watch for as your wound heals.    Call the Wound Care Center [phone] Immediately if You Notice:  ~Increased pain at the wound site  ~Excess drainage wets the dressing before it is time to change the dressing  ~Redness or swelling around or spreading away from the wound  ~Foul odor coming from the wound  ~Change in color or amount of drainage from the wound  ~Fever and chills  ~Nausea and vomiting    Changing Your Dressing:  Gather the supplies you will need for your dressing change:    -Trash bag    -Wound Cleanser    -Hand washing supplies    Tape or gauze wrap to hold your dressing in place  2. Wash your hand with soap and water. Put on gloves if available.  3. Prepare a clean working area.  4. Take your dressing off carefully.Throw away the old dressing in a trash bag. Try to keep the wound clean.  5. Look at your wound closely. Look for any foul odors; change in color or amount of drainage; redness or swelling around the wound; or redness spreading away from the wound.  6. Wash your hands with soap and water.  7. Clean your wound using the normal saline.  8. Put on a new dressing.  9. Put the plastic bag with the old dressing in another plastic bag and put directly in the trash.  10. Wash your hands one last time with soap and water.    General Information  ~Change your wound dressing as directed by the Wound Care Center staff or if it gets dirty or wet.  ~Do not put anything in your open wound that is not prescribed by your physician.  ~Change your dressing as close to hand washing facilities as possible.    Storing Your Dressing Supplies  ~Always keep your clean dressings in a  storage container that has a lid and is kept off of the floor away from children and pets. A plastic container with a lid or clean large plastic bag that can be tied shut will be best.  ~When preparing a clean working area, use a paper towel or other clean cover to put your supplies on. DO NOT put supplies directly on a table or bed.    Helping Your Wound Heal  ~Keep the outside of your dressing clean and dry. If it becomes soiled or wet, change it as soon as possible.  ~Keep your body clean. Bathe daily with soap and water as allowed by your wound care provider. Change your dressing after your shower or bath.  ~Eat a well balanced diet. Follow special dietary or fluid restrictions that your doctor has discussed.   ~Examine your wound carefully every time you remove your dressing. Immediately report any changes to your Wound Care Center staff.    The Proper Way to Wash Hands  ~Remove your jewelry before washing your hands so that the spaces between your fingers can be cleaned and dried.  ~Adjust the water temperature and lather your hands with soap.  ~Rub your hands together, cleaning the front and   back of each hand up to the wrist and between all fingers for 20 seconds (Sing the Happy Birthday song 2 times or the chorus of Yankee Doodle is about 20 seconds).  ~Rinse well.  ~If possible turn the faucets off with a paper towel or towel.

## 2022-10-17 NOTE — Progress Notes (Signed)
[facility]  [address]  [place] [address]  [phone]    Wound Care Progress Note      Date of Service: 10/17/2022  Yvette Schmidt, Yvette Schmidt, 47 y.o., White female  Date of Birth:  Nov 04, 1975  MRN: [id number]  PCP: Leroy Kennedy, MD  Referring provider: Referral Self  Information Obtained from: patient    Reason for visit:   Chief Complaint   Patient presents with    Wound       HPI:  Past medical history, past surgical history, social history and family history have been reviewed.  Both wounds on the buttock are improving nicely.  Her culture yielded MRSA, so she is now taking doxycycline.  The lesions on her legs are drying.  She has an appointment with a dermatologist today.    Social History     Tobacco Use   Smoking Status Every Day    Current packs/day: 0.50    Average packs/day: 0.5 packs/day for 15.0 years (7.5 ttl pk-yrs)    Types: Cigarettes   Smokeless Tobacco Never     Patient uses tobacco products: Yes, smokes.  Smoking cessation counseling was done for < 10 minutes.  Smoking education included the effects of smoking on surgical outcomes and wound healing.  Treatments for smoking cessation:  0 minutes on smoking cessation .  Patient response: Patient verbalizes understanding of counseling but does not agree to treatment or cessation plan.    Examination  Vitals:  Temperature: 36.2 C (97.2 F) (10/17/22 0853)  Heart Rate: 86 (10/17/22 0853)  Respiratory Rate: 18 (10/17/22 0853)  BP (Non-Invasive): 130/80 (10/17/22 0853)   /       DEBRIDEMENT   Debridement not performed.     PROCEDURES  No procedures perfomed    Infection status:   No signs/symptoms of infection    Neuro/Vascular Assessment  No change    Nursing Notes:   Theodore Demark, RN  10/17/22 3016  Signed     10/17/22 0800   Wound  Other (comment) Left Buttock   Initial Date of Wound Assessment/Initial Time of Wound Assessment: 10/10/22 0849   Present on Original Admission: No  Wound Approximate Age at First  Assessment (Weeks): 3 weeks  Primary Wound Type: (c) Other (comment)  Wound Number [facility]): 2  O...   LDAW Image    Dressing Status Removed   Wound Thickness Full Thickness   State of Healing Non-healing   Periwound Induration   Wound Edges Flat and Intact   Wound Bed fibrin   Wound Bed Additional Information 76%-100% fibrin   Wound Length (cm) 0.8 cm   Wound Width (cm) 0.8 cm   Wound Depth (cm) 0.1 cm   Wound Volume (cm^3) 0.064 cm^3   Wound Healing % 20   Wound Surface Area (cm^2) 0.64 cm^2   Wound Undermining No   Wound Tunneling  No   Wound Drainage Amount Small   Exudate/Drainage Serosanguineous   Wound Odor Absent   Wound Cleansed with saline   Wound  Other (comment) Right Buttock   Initial Date of Wound Assessment/Initial Time of Wound Assessment: 10/10/22 0847   Present on Original Admission: No  Wound Approximate Age at First Assessment (Weeks): 3 weeks  Primary Wound Type: (c) Other (comment)  Wound Number (Care Center): 1  O...   LDAW Image    Dressing Status Removed   Wound Thickness Full Thickness   State of Healing Non-healing   Periwound  Scarring;Induration   Wound Edges Flat and Intact   Wound Bed fibrin   Wound Bed Additional Information 76%-100% fibrin   Wound Length (cm) 0.8 cm   Wound Width (cm) 1.4 cm   Wound Depth (cm) 0.1 cm   Wound Volume (cm^3) 0.112 cm^3   Wound Healing % 22   Wound Surface Area (cm^2) 1.12 cm^2   Wound Undermining No   Wound Tunneling  No   Wound Drainage Amount Moderate   Exudate/Drainage Serosanguineous   Wound Odor Absent   Wound Cleansed with saline           Nutritional Assessment:    Recent Labs     07/18/22  1437   ALBUMIN 3.3*     Addressed with patient.     Diabetes Assessment:  No results found for: "GLUCPOCT", "GLUIP"]  Lab Results   Component Value Date    HA1C 8.5 (H) 07/18/2022     Continue current meds as per PCP or endocrinologist    Drugs affecting wound  NA    Social support:  Adequate    ASSESSMENT/PLAN    ENCOUNTER DIAGNOSES     ICD-10-CM   1.  Open wound of right buttock, subsequent encounter  S31.819D   2. Open wound of left buttock, subsequent encounter  S31.829D       Offloading status/Pressure Reduction:    NA    Edema control:    NA    F/U:  Follow up with physician in 1 week.      Vernell Barrier, MD 10/17/2022 09:16

## 2022-10-17 NOTE — Nursing Note (Signed)
10/17/22 0900   OTHER   Cleansed with saline   Primary dressing medihoney   Secondary dressing border   Location right and left buttock   Other to see derm today

## 2022-10-17 NOTE — Nursing Note (Signed)
10/17/22 0800   Wound  Other (comment) Left Buttock   Initial Date of Wound Assessment/Initial Time of Wound Assessment: 10/10/22 0849   Present on Original Admission: No  Wound Approximate Age at First Assessment (Weeks): 3 weeks  Primary Wound Type: (c) Other (comment)  Wound Number [facility]): 2  O...   LDAW Image    Dressing Status Removed   Wound Thickness Full Thickness   State of Healing Non-healing   Periwound Induration   Wound Edges Flat and Intact   Wound Bed fibrin   Wound Bed Additional Information 76%-100% fibrin   Wound Length (cm) 0.8 cm   Wound Width (cm) 0.8 cm   Wound Depth (cm) 0.1 cm   Wound Volume (cm^3) 0.064 cm^3   Wound Healing % 20   Wound Surface Area (cm^2) 0.64 cm^2   Wound Undermining No   Wound Tunneling  No   Wound Drainage Amount Small   Exudate/Drainage Serosanguineous   Wound Odor Absent   Wound Cleansed with saline   Wound  Other (comment) Right Buttock   Initial Date of Wound Assessment/Initial Time of Wound Assessment: 10/10/22 0847   Present on Original Admission: No  Wound Approximate Age at First Assessment (Weeks): 3 weeks  Primary Wound Type: (c) Other (comment)  Wound Number (Care Center): 1  O...   LDAW Image    Dressing Status Removed   Wound Thickness Full Thickness   State of Healing Non-healing   Periwound Scarring;Induration   Wound Edges Flat and Intact   Wound Bed fibrin   Wound Bed Additional Information 76%-100% fibrin   Wound Length (cm) 0.8 cm   Wound Width (cm) 1.4 cm   Wound Depth (cm) 0.1 cm   Wound Volume (cm^3) 0.112 cm^3   Wound Healing % 22   Wound Surface Area (cm^2) 1.12 cm^2   Wound Undermining No   Wound Tunneling  No   Wound Drainage Amount Moderate   Exudate/Drainage Serosanguineous   Wound Odor Absent   Wound Cleansed with saline

## 2022-10-21 ENCOUNTER — Other Ambulatory Visit: Payer: MEDICAID

## 2022-10-25 ENCOUNTER — Ambulatory Visit: Payer: MEDICAID | Attending: FAMILY PRACTICE | Admitting: FAMILY PRACTICE

## 2022-10-25 ENCOUNTER — Encounter (HOSPITAL_BASED_OUTPATIENT_CLINIC_OR_DEPARTMENT_OTHER): Payer: Self-pay | Admitting: FAMILY PRACTICE

## 2022-10-25 ENCOUNTER — Other Ambulatory Visit: Payer: Self-pay

## 2022-10-25 VITALS — BP 130/70 | HR 78 | Temp 96.8°F | Resp 18

## 2022-10-25 DIAGNOSIS — F17210 Nicotine dependence, cigarettes, uncomplicated: Secondary | ICD-10-CM | POA: Insufficient documentation

## 2022-10-25 DIAGNOSIS — S31829D Unspecified open wound of left buttock, subsequent encounter: Secondary | ICD-10-CM | POA: Insufficient documentation

## 2022-10-25 DIAGNOSIS — S31829A Unspecified open wound of left buttock, initial encounter: Secondary | ICD-10-CM

## 2022-10-25 MED ORDER — MUPIROCIN 2 % TOPICAL OINTMENT
TOPICAL_OINTMENT | Freq: Three times a day (TID) | CUTANEOUS | 1 refills | Status: AC
Start: 2022-10-25 — End: 2022-11-04

## 2022-10-25 MED ORDER — FUROSEMIDE 40 MG TABLET
40.0000 mg | ORAL_TABLET | Freq: Every day | ORAL | 0 refills | Status: AC
Start: 2022-10-25 — End: 2022-11-04

## 2022-10-25 MED ORDER — LIDOCAINE HCL 2 % MUCOSAL JELLY
5.0000 mL | Freq: Once | 0 refills | Status: AC
Start: 2022-10-25 — End: 2022-10-25

## 2022-10-25 MED ORDER — DOXYCYCLINE HYCLATE 100 MG CAPSULE
100.0000 mg | ORAL_CAPSULE | Freq: Two times a day (BID) | ORAL | 0 refills | Status: AC
Start: 2022-10-25 — End: 2022-11-04

## 2022-10-25 NOTE — Progress Notes (Signed)
[facility]  [address]  [place] [address]  [phone]    Wound Care Progress Note      Date of Service: 10/25/2022  Yvette Schmidt, Yvette Schmidt, 47 y.o., White female  Date of Birth:  December 09, 1975  MRN: [id number]  PCP: Leroy Kennedy, MD  Referring provider: Referral Self  Information Obtained from: patient    Reason for visit:   Chief Complaint   Patient presents with    Wound       HPI:  Past medical history, past surgical history, social history and family history have been reviewed.  The small wound on the right buttock is healed.  The left buttock wound has improved.  She has been to dermatologist for the rash on her lower legs, had a biopsy, results pending.  She continues to complain of feet and lower leg swelling which is causing her pain.    Social History     Tobacco Use   Smoking Status Every Day    Current packs/day: 0.50    Average packs/day: 0.5 packs/day for 15.0 years (7.5 ttl pk-yrs)    Types: Cigarettes   Smokeless Tobacco Never     Patient uses tobacco products: Yes, smokes.  Smoking cessation counseling was done for < 10 minutes.  Smoking education included the effects of smoking on surgical outcomes and wound healing.  Treatments for smoking cessation:  1 minute on smoking cessation .  Patient response: Patient verbalizes understanding of counseling but does not agree to treatment or cessation plan.    Examination  Vitals:  Temperature: 36 C (96.8 F) (10/25/22 0838)  Heart Rate: 78 (10/25/22 0838)  Respiratory Rate: 18 (10/25/22 0838)  BP (Non-Invasive): 130/70 (10/25/22 1308)   /       DEBRIDEMENT   Debridement not performed.     PROCEDURES  No procedures perfomed    Infection status:   No signs/symptoms of infection    Neuro/Vascular Assessment  No change    Nursing Notes:   Donnita Falls, RN  10/25/22 6578  Signed     10/25/22 0800   Wound  Other (comment) Left Buttock   Initial Date of Wound Assessment/Initial Time of Wound Assessment: 10/10/22 0849   Present on  Original Admission: No  Wound Approximate Age at First Assessment (Weeks): 3 weeks  Primary Wound Type: (c) Other (comment)  Wound Number [facility]): 2  O...   LDAW Image    Dressing Status Removed   Wound Thickness Full Thickness   State of Healing Non-healing   Periwound Induration;Scarring   Wound Edges Flat and Intact   Wound Bed fibrin   Wound Bed Additional Information 76%-100% fibrin   Wound Length (cm) 0.6 cm   Wound Width (cm) 0.6 cm   Wound Depth (cm) 0.2 cm   Wound Volume (cm^3) 0.072 cm^3   Wound Healing % 10   Wound Surface Area (cm^2) 0.36 cm^2   Wound Undermining No   Wound Tunneling  No   Wound Drainage Amount Moderate   Exudate/Drainage Yellow;Tan   Wound Odor Absent   Wound  Other (comment) Right Buttock   Initial Date of Wound Assessment/Initial Time of Wound Assessment: 10/10/22 0847   Present on Original Admission: No  Wound Approximate Age at First Assessment (Weeks): 3 weeks  Primary Wound Type: (c) Other (comment)  Wound Number [facility]): 1  O...   LDAW Image    Dressing Status Removed   State of Healing Epithelialized  Wound Length (cm) 0 cm   Wound Width (cm) 0 cm   Wound Depth (cm) 0 cm   Wound Volume (cm^3) 0 cm^3   Wound Healing % 100   Wound Surface Area (cm^2) 0 cm^2   Wound Drainage Amount None         Donnita Falls, RN  10/25/22 0912  Signed     10/25/22 0900   Wound  Other (comment) Left Buttock   Initial Date of Wound Assessment/Initial Time of Wound Assessment: 10/10/22 0849   Present on Original Admission: No  Wound Approximate Age at First Assessment (Weeks): 3 weeks  Primary Wound Type: (c) Other (comment)  Wound Number [facility]): 2  O...   SURGICAL/WOUND INTERVENTION Debridement   Debridement/Intervention Debridement   Debridement Performed By Lanae Boast   Debridement Time Out Performed 0911   Debridement Start Time 0911   Debridement Pain Control Lidocaine 2% Topical Gel   Wound Debrided Tissue and Material Subcutaneous;Fibrin/slough   Instrument Curette   Debridement  Bleeding Small   Debridement Hemostasis Pressure   Percent Wound Debridement 100 %   Debridement Depth Subcutaneous Tissue   Debridement End Time 0912   Debridment Procedural Pain 0   Debridement Post Procedural Pain 0   Debridement Response to Treatment Patient tolerated well   Wound Cleansed with saline   Post-Procedure Length (cm) 0.6 cm   Post-Procedure Width (cm) 0.6 cm   Post-Procedure Depth (cm) 0.2 cm   Post-Procedure Surface Area (cm^2) 0.36 cm^2   Post-Procedure Volume (cm^3) 0.072 cm^3   OTHER   Cleansed with saline   Primary dressing medihoney   Secondary dressing border   Location left buttock         Donnita Falls, RN  10/25/22 0913  Signed     10/25/22 0900   OTHER   Cleansed with saline   Primary dressing medihoney   Secondary dressing border   Support layer tubi-grip  (bilateral lower legs)   Location left buttock   Other removed one suture on right knee           Nutritional Assessment:    Recent Labs     07/18/22  1437   ALBUMIN 3.3*     Addressed with patient.     Diabetes Assessment:  No results found for: "GLUCPOCT", "GLUIP"]  Lab Results   Component Value Date    HA1C 8.5 (H) 07/18/2022     Continue current meds as per PCP or endocrinologist    Drugs affecting wound  NA    Social support:  Adequate    ASSESSMENT/PLAN    ENCOUNTER DIAGNOSES     ICD-10-CM   1. Open wound of left buttock, subsequent encounter  S31.829D       Offloading status/Pressure Reduction:    NA    Edema control:    Tubigrip    F/U:  Follow up with physician in 2 week.      Vernell Barrier, MD 10/25/2022 09:20

## 2022-10-25 NOTE — Nursing Note (Signed)
10/25/22 0900   Wound  Other (comment) Left Buttock   Initial Date of Wound Assessment/Initial Time of Wound Assessment: 10/10/22 0849   Present on Original Admission: No  Wound Approximate Age at First Assessment (Weeks): 3 weeks  Primary Wound Type: (c) Other (comment)  Wound Number [facility]): 2  O...   SURGICAL/WOUND INTERVENTION Debridement   Debridement/Intervention Debridement   Debridement Performed By Lanae Boast   Debridement Time Out Performed 0911   Debridement Start Time 0911   Debridement Pain Control Lidocaine 2% Topical Gel   Wound Debrided Tissue and Material Subcutaneous;Fibrin/slough   Instrument Curette   Debridement Bleeding Small   Debridement Hemostasis Pressure   Percent Wound Debridement 100 %   Debridement Depth Subcutaneous Tissue   Debridement End Time 0912   Debridment Procedural Pain 0   Debridement Post Procedural Pain 0   Debridement Response to Treatment Patient tolerated well   Wound Cleansed with saline   Post-Procedure Length (cm) 0.6 cm   Post-Procedure Width (cm) 0.6 cm   Post-Procedure Depth (cm) 0.2 cm   Post-Procedure Surface Area (cm^2) 0.36 cm^2   Post-Procedure Volume (cm^3) 0.072 cm^3   OTHER   Cleansed with saline   Primary dressing medihoney   Secondary dressing border   Location left buttock

## 2022-10-25 NOTE — Procedures (Signed)
[facility]  [address]  [place] [address]  Operated by [facility]  Procedure Note    Name: Yvette Schmidt MRN:  [id number]   Date: 10/25/2022 DOB:  [birth year] (46 y.o.)         DEBRIDEMENT, SUB-Q TISSUE; UP TO 500 SQ CM (AMB ONLY)    Performed by: Vernell Barrier, MD  Authorized by: Vernell Barrier, MD    Time Out:      Immediately before the procedure, a time out was called:  Yes      Patient verified:  Yes      Procedure verified:  Yes      Site verified:  Yes  Procedure Details:      Number of sq cm debrided?:  0.36      Documentation:      see progress note      Vernell Barrier, MD

## 2022-10-25 NOTE — Nursing Note (Signed)
10/25/22 0800   Wound  Other (comment) Left Buttock   Initial Date of Wound Assessment/Initial Time of Wound Assessment: 10/10/22 0849   Present on Original Admission: No  Wound Approximate Age at First Assessment (Weeks): 3 weeks  Primary Wound Type: (c) Other (comment)  Wound Number [facility]): 2  O...   LDAW Image    Dressing Status Removed   Wound Thickness Full Thickness   State of Healing Non-healing   Periwound Induration;Scarring   Wound Edges Flat and Intact   Wound Bed fibrin   Wound Bed Additional Information 76%-100% fibrin   Wound Length (cm) 0.6 cm   Wound Width (cm) 0.6 cm   Wound Depth (cm) 0.2 cm   Wound Volume (cm^3) 0.072 cm^3   Wound Healing % 10   Wound Surface Area (cm^2) 0.36 cm^2   Wound Undermining No   Wound Tunneling  No   Wound Drainage Amount Moderate   Exudate/Drainage Yellow;Tan   Wound Odor Absent   Wound  Other (comment) Right Buttock   Initial Date of Wound Assessment/Initial Time of Wound Assessment: 10/10/22 0847   Present on Original Admission: No  Wound Approximate Age at First Assessment (Weeks): 3 weeks  Primary Wound Type: (c) Other (comment)  Wound Number [facility]): 1  O...   LDAW Image    Dressing Status Removed   State of Healing Epithelialized   Wound Length (cm) 0 cm   Wound Width (cm) 0 cm   Wound Depth (cm) 0 cm   Wound Volume (cm^3) 0 cm^3   Wound Healing % 100   Wound Surface Area (cm^2) 0 cm^2   Wound Drainage Amount None

## 2022-10-25 NOTE — Nursing Note (Signed)
10/25/22 0900   OTHER   Cleansed with saline   Primary dressing medihoney   Secondary dressing border   Support layer tubi-grip  (bilateral lower legs)   Location left buttock   Other removed one suture on right knee

## 2022-10-25 NOTE — Nursing Note (Deleted)
10/25/22 0900   OTHER   Cleansed with saline   Primary dressing medihoney   Secondary dressing border   Location left buttock

## 2022-11-08 ENCOUNTER — Ambulatory Visit (HOSPITAL_BASED_OUTPATIENT_CLINIC_OR_DEPARTMENT_OTHER): Payer: MEDICAID | Admitting: FAMILY PRACTICE

## 2022-11-11 ENCOUNTER — Other Ambulatory Visit: Payer: Self-pay

## 2022-11-11 ENCOUNTER — Other Ambulatory Visit (INDEPENDENT_AMBULATORY_CARE_PROVIDER_SITE_OTHER): Payer: Self-pay | Admitting: Internal Medicine

## 2022-11-14 ENCOUNTER — Other Ambulatory Visit (INDEPENDENT_AMBULATORY_CARE_PROVIDER_SITE_OTHER): Payer: Self-pay | Admitting: Internal Medicine

## 2022-12-20 ENCOUNTER — Other Ambulatory Visit (INDEPENDENT_AMBULATORY_CARE_PROVIDER_SITE_OTHER): Payer: Self-pay | Admitting: Internal Medicine

## 2022-12-26 ENCOUNTER — Other Ambulatory Visit (INDEPENDENT_AMBULATORY_CARE_PROVIDER_SITE_OTHER): Payer: Self-pay | Admitting: Internal Medicine

## 2022-12-26 ENCOUNTER — Telehealth (INDEPENDENT_AMBULATORY_CARE_PROVIDER_SITE_OTHER): Payer: MEDICAID | Admitting: Internal Medicine

## 2022-12-26 ENCOUNTER — Other Ambulatory Visit: Payer: Self-pay

## 2022-12-26 DIAGNOSIS — E119 Type 2 diabetes mellitus without complications: Secondary | ICD-10-CM

## 2022-12-26 DIAGNOSIS — K439 Ventral hernia without obstruction or gangrene: Secondary | ICD-10-CM

## 2022-12-26 DIAGNOSIS — K529 Noninfective gastroenteritis and colitis, unspecified: Secondary | ICD-10-CM

## 2022-12-26 MED ORDER — ONDANSETRON 4 MG DISINTEGRATING TABLET
4.0000 mg | ORAL_TABLET | Freq: Three times a day (TID) | ORAL | 0 refills | Status: AC | PRN
Start: 2022-12-26 — End: 2023-01-05

## 2022-12-26 MED ORDER — GABAPENTIN 800 MG TABLET
ORAL_TABLET | ORAL | 0 refills | Status: DC
Start: 2022-12-26 — End: 2023-09-11

## 2022-12-26 MED ORDER — DICLOFENAC 1 % TOPICAL GEL
Freq: Four times a day (QID) | CUTANEOUS | 2 refills | Status: AC | PRN
Start: 2022-12-26 — End: 2023-03-26

## 2022-12-26 MED ORDER — AMITRIPTYLINE 50 MG TABLET
50.0000 mg | ORAL_TABLET | Freq: Every evening | ORAL | 0 refills | Status: DC
Start: 2022-12-26 — End: 2023-04-24

## 2022-12-26 MED ORDER — NURTEC ODT 75 MG DISINTEGRATING TABLET
75.0000 mg | ORAL_TABLET | ORAL | 0 refills | Status: AC | PRN
Start: 2022-12-26 — End: 2023-01-25

## 2022-12-26 MED ORDER — MELOXICAM 15 MG TABLET
15.0000 mg | ORAL_TABLET | Freq: Every day | ORAL | 0 refills | Status: AC
Start: 2022-12-26 — End: 2023-03-26

## 2022-12-26 NOTE — Progress Notes (Incomplete)
Neurology suggested nurtec for abortive migraine therapy.   Neurology ordered a home sleep study. Neurology also referred the patient to ophthalmology and ordered an MRI of the brain for blurry vision     She was started on aimovig injection for migraines, patient reports that she stopped taking this as it caused GI upset     Patient had been following with wound care for a wound on the L. Buttock     Recommended immodium. Will send Zofran for nausea     Patient declined jardiance or other diabetic medication. Reports that it is normal for her sugars to run in the 300s.       Refer to podiatry

## 2023-01-19 ENCOUNTER — Other Ambulatory Visit (INDEPENDENT_AMBULATORY_CARE_PROVIDER_SITE_OTHER): Payer: Self-pay | Admitting: Internal Medicine

## 2023-01-27 ENCOUNTER — Other Ambulatory Visit (INDEPENDENT_AMBULATORY_CARE_PROVIDER_SITE_OTHER): Payer: Self-pay | Admitting: Internal Medicine

## 2023-01-30 ENCOUNTER — Ambulatory Visit (HOSPITAL_BASED_OUTPATIENT_CLINIC_OR_DEPARTMENT_OTHER): Payer: Self-pay | Admitting: Surgery

## 2023-02-16 ENCOUNTER — Other Ambulatory Visit (INDEPENDENT_AMBULATORY_CARE_PROVIDER_SITE_OTHER): Payer: Self-pay | Admitting: Internal Medicine

## 2023-02-17 ENCOUNTER — Other Ambulatory Visit (INDEPENDENT_AMBULATORY_CARE_PROVIDER_SITE_OTHER): Payer: Self-pay | Admitting: Internal Medicine

## 2023-02-17 MED ORDER — FUROSEMIDE 20 MG TABLET
20.0000 mg | ORAL_TABLET | Freq: Every day | ORAL | 0 refills | Status: DC
Start: 2023-02-17 — End: 2023-04-19

## 2023-02-17 MED ORDER — FLUTICASONE 100 MCG-SALMETEROL 50 MCG/DOSE BLISTR POWDR FOR INHALATION
1.0000 | DISK | Freq: Two times a day (BID) | RESPIRATORY_TRACT | 0 refills | Status: DC
Start: 2023-02-17 — End: 2023-04-19

## 2023-02-17 MED ORDER — PANTOPRAZOLE 40 MG TABLET,DELAYED RELEASE
40.0000 mg | DELAYED_RELEASE_TABLET | Freq: Two times a day (BID) | ORAL | 0 refills | Status: DC
Start: 2023-02-17 — End: 2023-09-06

## 2023-02-17 MED ORDER — AZELASTINE 137 MCG (0.1 %) NASAL SPRAY
1.0000 | Freq: Two times a day (BID) | NASAL | 0 refills | Status: DC
Start: 2023-02-17 — End: 2023-04-24

## 2023-02-23 ENCOUNTER — Other Ambulatory Visit (INDEPENDENT_AMBULATORY_CARE_PROVIDER_SITE_OTHER): Payer: Self-pay | Admitting: Internal Medicine

## 2023-03-07 ENCOUNTER — Ambulatory Visit (HOSPITAL_BASED_OUTPATIENT_CLINIC_OR_DEPARTMENT_OTHER): Payer: Self-pay | Admitting: NEUROLOGY

## 2023-03-22 ENCOUNTER — Other Ambulatory Visit (INDEPENDENT_AMBULATORY_CARE_PROVIDER_SITE_OTHER): Payer: Self-pay | Admitting: Internal Medicine

## 2023-03-24 ENCOUNTER — Other Ambulatory Visit (INDEPENDENT_AMBULATORY_CARE_PROVIDER_SITE_OTHER): Payer: Self-pay | Admitting: Internal Medicine

## 2023-04-03 ENCOUNTER — Encounter (INDEPENDENT_AMBULATORY_CARE_PROVIDER_SITE_OTHER): Payer: Self-pay | Admitting: Internal Medicine

## 2023-04-18 ENCOUNTER — Encounter (INDEPENDENT_AMBULATORY_CARE_PROVIDER_SITE_OTHER): Payer: Self-pay | Admitting: Internal Medicine

## 2023-04-19 ENCOUNTER — Ambulatory Visit (INDEPENDENT_AMBULATORY_CARE_PROVIDER_SITE_OTHER): Payer: MEDICAID

## 2023-04-19 ENCOUNTER — Other Ambulatory Visit: Payer: MEDICAID

## 2023-04-19 ENCOUNTER — Other Ambulatory Visit: Payer: Self-pay

## 2023-04-19 VITALS — BP 138/82 | HR 102 | Wt 288.0 lb

## 2023-04-19 DIAGNOSIS — E119 Type 2 diabetes mellitus without complications: Secondary | ICD-10-CM

## 2023-04-19 DIAGNOSIS — E785 Hyperlipidemia, unspecified: Secondary | ICD-10-CM | POA: Insufficient documentation

## 2023-04-19 DIAGNOSIS — G43909 Migraine, unspecified, not intractable, without status migrainosus: Secondary | ICD-10-CM

## 2023-04-19 DIAGNOSIS — M25561 Pain in right knee: Secondary | ICD-10-CM

## 2023-04-19 LAB — COMPREHENSIVE METABOLIC PANEL, NON-FASTING
ALBUMIN: 3.7 g/dL (ref 3.5–5.0)
ALKALINE PHOSPHATASE: 140 U/L — ABNORMAL HIGH (ref 40–110)
ALT (SGPT): 15 U/L (ref <31)
ANION GAP: 14 mmol/L — ABNORMAL HIGH (ref 4–13)
AST (SGOT): 26 U/L (ref 11–34)
BILIRUBIN TOTAL: 0.4 mg/dL (ref 0.3–1.3)
BUN/CREA RATIO: 12 (ref 6–22)
BUN: 9 mg/dL (ref 8–25)
CALCIUM: 9.2 mg/dL (ref 8.6–10.2)
CHLORIDE: 99 mmol/L (ref 96–111)
CO2 TOTAL: 22 mmol/L (ref 22–30)
CREATININE: 0.73 mg/dL (ref 0.60–1.05)
ESTIMATED GFR - FEMALE: >90 mL/min/BSA (ref >=60)
GLUCOSE: 269 mg/dL — ABNORMAL HIGH (ref 65–125)
POTASSIUM: 4.8 mmol/L (ref 3.7–5.3)
PROTEIN TOTAL: 7.4 g/dL (ref 6.0–7.9)
SODIUM: 135 mmol/L — ABNORMAL LOW (ref 136–145)

## 2023-04-19 LAB — LIPID PANEL
CHOL/HDL RATIO: 8.1
CHOLESTEROL: 301 mg/dL — ABNORMAL HIGH (ref 100–200)
HDL CHOL: 37 mg/dL — ABNORMAL LOW (ref >=50)
LDL CALC: 206 mg/dL — ABNORMAL HIGH (ref <100)
NON-HDL: 264 mg/dL — ABNORMAL HIGH (ref <=190)
TRIGLYCERIDES: 286 mg/dL — ABNORMAL HIGH (ref <150)
VLDL CALC: 62 mg/dL — ABNORMAL HIGH (ref <30)

## 2023-04-19 LAB — CBC
HCT: 55.4 % — ABNORMAL HIGH (ref 34.8–46.0)
HGB: 18.3 g/dL — ABNORMAL HIGH (ref 11.5–16.0)
MCH: 32.2 pg — ABNORMAL HIGH (ref 26.0–32.0)
MCHC: 33 g/dL (ref 31.0–35.5)
MCV: 97.5 fL (ref 78.0–100.0)
MPV: 12.6 fL — ABNORMAL HIGH (ref 8.7–12.5)
PLATELETS: 185 10*3/uL (ref 150–400)
RBC: 5.68 10*6/uL — ABNORMAL HIGH (ref 3.85–5.22)
RDW-CV: 13.7 % (ref 11.5–15.5)
WBC: 10 10*3/uL (ref 3.7–11.0)

## 2023-04-19 LAB — HGA1C (HEMOGLOBIN A1C WITH EST AVG GLUCOSE)
ESTIMATED AVERAGE GLUCOSE: 332 mg/dL
HEMOGLOBIN A1C: 13.2 % — ABNORMAL HIGH (ref 4.0–5.6)

## 2023-04-19 MED ORDER — FUROSEMIDE 20 MG TABLET
20.0000 mg | ORAL_TABLET | Freq: Every day | ORAL | 0 refills | Status: DC
Start: 2023-04-19 — End: 2023-05-26

## 2023-04-19 MED ORDER — CETIRIZINE 10 MG TABLET
10.0000 mg | ORAL_TABLET | Freq: Every day | ORAL | 0 refills | Status: DC | PRN
Start: 2023-04-19 — End: 2023-05-17

## 2023-04-19 MED ORDER — BETAMETHASONE VALERATE 0.1 % TOPICAL OINTMENT
TOPICAL_OINTMENT | Freq: Two times a day (BID) | CUTANEOUS | 0 refills | Status: DC
Start: 2023-04-19 — End: 2023-05-26

## 2023-04-19 MED ORDER — BUDESONIDE-FORMOTEROL HFA 160 MCG-4.5 MCG/ACTUATION AEROSOL INHALER
2.0000 | INHALATION_SPRAY | Freq: Two times a day (BID) | RESPIRATORY_TRACT | 0 refills | Status: DC
Start: 2023-04-19 — End: 2023-09-06

## 2023-04-19 MED ORDER — NURTEC ODT 75 MG DISINTEGRATING TABLET
75.0000 mg | ORAL_TABLET | ORAL | 0 refills | Status: AC | PRN
Start: 2023-04-19 — End: 2023-05-19

## 2023-04-19 MED ORDER — ROSUVASTATIN 20 MG TABLET
20.0000 mg | ORAL_TABLET | Freq: Every evening | ORAL | 4 refills | Status: DC
Start: 2023-04-19 — End: 2023-09-06

## 2023-04-19 NOTE — Progress Notes (Addendum)
 [facility]  [address] [place] [id number]    Progress Note    Encounter Date: 04/19/2023    Patient ID:  Yvette Schmidt  HYQ:[id number]    DOB: [birth year]  Age: 48 y.o. female  Subjective   Subjective:     Chief Complaint   Patient presents with    Follow Up     HPI  Chief Complaint  Medication issues, knee pain and locking, head injury, headaches, high blood sugar, foot pain and burning, vein popping up on skin, anxiety symptoms, and request to examine patient's husband's pacemaker incision.    History of Present Illness  The patient presents with multiple concerns. Regarding her knee, she reports locking, especially when straightening her leg, accompanied by severe pain. She describes her kneecap as displaced and mentions grinding sensations. The patient notes a history of a torn meniscus in the same knee that had previously healed.    The patient also complains of recurrent headaches since being taken off Imitrex  by her neurologist. She reports headaches occurring more than three times weekly, sometimes requiring her to cover her eyes and sleep. Over-the-counter migraine medication provides some relief, but she expresses a desire to resume Imitrex .    Additionally, the patient reports elevated blood glucose levels, with a recent reading of 281. She describes foot pain and burning sensations consistent with neuropathy. The patient mentions a history of blood sugar fluctuations, ranging from extremely low to moderately high.    The patient also reports experiencing withdrawal symptoms since discontinuing Klonopin  about six weeks ago. These symptoms include headaches, crying spells, and a feeling of restlessness. She had previously been taking 0.25mg  three times daily, which she reports was effective in managing her symptoms.    Lastly, the patient mentions hitting her head yesterday and requests an evaluation.    Medical History  - Type 2 Diabetes  - Hypertension  - Hyperlipidemia  -  Migraines  - Knee issues (previous torn meniscus, now healed)  - Neuropathy  - Depression/Anxiety (implied by use of Klonopin )    Surgical History  - Torn meniscus (healed)  - Sister Bertell Broach had surgery (unspecified type)  - Patient's partner Artist) had pacemaker implantation    Medications and Supplements  - Lasix  (generic)  - Klonopin  (0.5mg , taken as half tablet 3 times a day)  - Cholesterol medication (unspecified)  - Imitrex  (previously taken, discontinued)  - Over-the-counter migraine medicine with aspirin, caffeine, and Tylenol  - Insulin   - Neurontin   - Meloxicam   - Triamcinolone (topical)  - Mupirocin  (topical)    Family History  - Scotty (relationship unspecified) uses an inhaler and nebulizer solutions  - Sister Bertell Broach had surgery (details not provided)    Social History  - Living situation: Lives with partner named IT consultant  - Social support: Has a sister named Environmental education officer    Review of Systems  - Musculoskeletal: Knee locking up badly, bruising around the side, extremely painful when straightening leg, kneecap grinding  - Neurological: Headaches coming back, sometimes has to cover eyes and sleep  - Endocrine: High blood sugar (281), feet hurt and burn with neuropathy  - Dermatological: Toes are red and tingling, almost hot  - Cardiovascular: Vein popping up  - Psychiatric: Crying spells, feeling like going to crawl out of skin  - Gastrointestinal: Digestion is hit and miss    Current Outpatient Medications   Medication Sig    ACCU-CHEK GUIDE GLUCOSE METER Does not apply Misc  ACCU-CHEK SOFTCLIX LANCETS Misc USE TO TEST BLOOD SUGAR FOUR TIMES DAILY BEFORE MEALS AND AT BEDTIME    albuterol  sulfate (PROVENTIL ) 2.5 mg /3 mL (0.083 %) Inhalation nebulizer solution USE 1 VIAL VIA NEBULIZER FOUR TIMES DAILY    albuterol  sulfate (VENTOLIN  HFA) 90 mcg/actuation Inhalation oral inhaler INHALE 1-2 PUFFS BY INHALATION EVERY 6 HOURS AS NEEDED    amitriptyline  (ELAVIL ) 50 mg Oral Tablet Take 1 Tablet (50 mg total) by mouth  Every night for 90 days    betamethasone  valerate (VALISONE ) 0.1 % Ointment Apply topically Twice daily for 30 days    budesonide -formoteroL  (SYMBICORT ) 160-4.5 mcg/actuation Inhalation oral inhaler Take 2 Puffs by inhalation Twice daily    cetirizine  (ZYRTEC ) 10 mg Oral Tablet Take 1 Tablet (10 mg total) by mouth Once per day as needed for Allergies for up to 30 days    clonazePAM  (KLONOPIN ) 0.5 mg Oral Tablet TAKE HALF A TABLET BY MOUTH THREE TIMES DAILY    EPINEPHrine  0.3 mg/0.3 mL Injection Auto-Injector Inject 0.3 mL (0.3 mg total) into the muscle Once, as needed for up to 2 doses    fluticasone  propionate (FLONASE ) 50 mcg/actuation Nasal Spray, Suspension Administer 2 Sprays into each nostril Twice daily    furosemide  (LASIX ) 20 mg Oral Tablet Take 1 Tablet (20 mg total) by mouth Daily    gabapentin  (NEURONTIN ) 300 mg Oral Capsule TAKE 1 CAPSULE IN THE MORNING, 1 CAPSULE IN THE AFTERNOON AND 2 CAPSULES IN THE EVENING    gabapentin  (NEURONTIN ) 800 mg Oral Tablet Take 1/2 tablet 400mg  morning and afternoon and 1 tablet 800mg  in the evening    insulin  aspart U-100 (NOVOLOG ) 100 unit/mL (3 mL) Subcutaneous Insulin  Pen INJECT UNDER THE SKIN TWICE A DAY BEFORE MEALS PER SLIDING SCALE <150 DO NOT TAKE 150-200 2 UNITS 201-250 4 UNITS 251-300 6 UNITS 301-350 9 UNITS 351-400 12 UNITS    Insulin  Needles, Disposable, (SURE COMFORT PEN NEEDLE) 32 gauge x 1/4" Needle USE TO INJECT INSULIN  TWICE DAILY    ipratropium (ATROVENT) 0.02 % Inhalation Solution TAKE 3 ML BY NEBULIZATION FOUR TIMES A DAY    meloxicam  (MOBIC ) 15 mg Oral Tablet TAKE 1 TABLET BY MOUTH ONCE DAILY    Nebulizers Misc Use with Nebulizer medication    pantoprazole  (PROTONIX ) 40 mg Oral Tablet, Delayed Release (E.C.) Take 1 Tablet (40 mg total) by mouth Twice daily    rimegepant (NURTEC ODT ) 75 mg Oral Tablet, Rapid Dissolve Place 1 Tablet (75 mg total) under the tongue Every 24 hours as needed for Migraine for up to 30 days    rosuvastatin  (CRESTOR ) 20 mg  Oral Tablet Take 1 Tablet (20 mg total) by mouth Every evening     Allergies   Allergen Reactions    Beeswax      Allergic to Bee Stings    Clindamycin     Hydrocodone     Other      Nutrasweet, sugar subs    Sulfa (Sulfonamides)      HIVES    Theophylline      ANXIETY     Past Medical History:   Diagnosis Date    Asthma     COPD (chronic obstructive pulmonary disease) (CMS HCC)     Diabetes mellitus, type 2 (CMS HCC)     GERD (gastroesophageal reflux disease)     Hypertension     MRSA (methicillin resistant staph aureus) culture positive 10/10/2022    Right buttock wound  Past Surgical History:   Procedure Laterality Date    HX HYSTERECTOMY  2012    Left ooperectomy, R. ovary retained    HX LAP CHOLECYSTECTOMY  05/12/2017    PB COLONOSCOPY,DIAGNOSTIC           Family Medical History:    None         Social History     Tobacco Use    Smoking status: Every Day     Current packs/day: 0.50     Average packs/day: 0.5 packs/day for 15.0 years (7.5 ttl pk-yrs)     Types: Cigarettes    Smokeless tobacco: Never   Substance Use Topics    Alcohol use: Not Currently     Comment: seldom    Drug use: Not Currently     Types: Marijuana     Comment: occasionally       Review of Systems   All other systems reviewed and are negative.  Other than mentioned in HPI     Objective   Objective:   Vitals: BP 138/82   Pulse (!) 102   Wt 131 kg (288 lb)   SpO2 93%   BMI 47.93 kg/m     Physical Exam  Vitals reviewed.   Constitutional:       General: She is not in acute distress.  Eyes:      Extraocular Movements: Extraocular movements intact.      Conjunctiva/sclera: Conjunctivae normal.      Pupils: Pupils are equal, round, and reactive to light.   Cardiovascular:      Rate and Rhythm: Normal rate and regular rhythm.      Heart sounds: S1 normal and S2 normal.   Pulmonary:      Effort: Pulmonary effort is normal.      Breath sounds: Normal breath sounds.   Abdominal:      General: Bowel sounds are normal.      Palpations: Abdomen  is soft.   Musculoskeletal:      Right lower leg: 1+ Pitting Edema present.      Left lower leg: 1+ Pitting Edema present.   Skin:     General: Skin is warm and dry.   Neurological:      General: No focal deficit present.      Mental Status: She is alert and oriented to person, place, and time.   Psychiatric:         Mood and Affect: Mood normal.         Behavior: Behavior normal.          Assessment & Plan:     ENCOUNTER DIAGNOSES     ICD-10-CM   1. Right knee pain  M25.561   2. DM2 (diabetes mellitus, type 2) (CMS HCC)  E11.9   3. Hyperlipidemia, unspecified hyperlipidemia type  E78.5   4. Migraine Headaches  G43.909     Diabetes Mellitus with Peripheral Neuropathy  Patient reports blood glucose levels around 281 mg/dL. Complaints of foot pain and burning sensation consistent with diabetic neuropathy. Patient mentions red, tingling, and hot toes. Previous use of compression sleeve was helpful but needs replacement.  - Patient historically has been non-compliant with diabetes management. Reports that it is normal for her blood sugars to run in the 300s. Previously tried Ozempic  which she was unable to tolerate due to gastroparesis. She has tried metformin in the past as well and was unable to tolerate. We have discussed other antidiabetic medication options which patient has declined,  she prefers to use insulin  only.  - Check HbA1c levels  - Consider adding Jardiance (empagliflozin) to current insulin  regimen based on A1c results  - Recommend replacement of compression sleeve for neuropathy symptoms    Knee Pain and Locking  Patient reports right knee locking up as well as "grinding" The kneecap is displaced and grinding can be felt at the edge. ROM intact, no significant crepitus appreciated. Patient has a history of meniscus tear in the left knee.  - Xray R. Knee ordered. Reassess after X-ray results before considering orthopedic referral    Migraine Headaches  Patient reports worsening headaches occurring more  than 3 times per week since discontinuation of Imitrex  by neurologist. Over-the-counter migraine medication provides some relief. Previous sumatriptan  use was discontinued due to blood pressure concerns.  - Prescribe Nurtec (rimegepant) as an alternative to sumatriptan . Nurtec recommended by neurology, less likely to contribute to BP elevations.   - Consider returning to previous medication if Nurtec is ineffective    Mental Health  Anxiety   - Currently on Klonopin  0.25 TID. She has a history of using various medications for anxiety, including Zoloft, Buspar and wellbutrin, which were not effective and she experienced side effects.  - Previously on xanax which she did not tolerate   Plan   - Klonopin  management deferred to Dr. Lula Sale   - Patient is aware that controlled medications carry an increased risk of dependence and abuse as well as risk of overdose and withdrawal. These medications are to be taken as directed.      Head Injury  Patient reports hitting her head yesterday and requests examination. No changes in mental status. No vomiting. PERRL. Negative battle sign, negative periorbital ecchymosis.   - Continue to monitor for GI symptoms, changes in mental status, visual changes.     Hx of Cervical Cancer  - Patient has a past medical history of cervical cancer which was treated with total hysterectomy in 2012    Orders Placed This Encounter    XR KNEE RIGHT 3 VIEW    CBC    COMPREHENSIVE METABOLIC PANEL, NON-FASTING    LIPID PANEL    HGA1C (HEMOGLOBIN A1C WITH EST AVG GLUCOSE)    furosemide  (LASIX ) 20 mg Oral Tablet    rosuvastatin  (CRESTOR ) 20 mg Oral Tablet    rimegepant (NURTEC ODT ) 75 mg Oral Tablet, Rapid Dissolve    budesonide -formoteroL  (SYMBICORT ) 160-4.5 mcg/actuation Inhalation oral inhaler    cetirizine  (ZYRTEC ) 10 mg Oral Tablet    betamethasone  valerate (VALISONE ) 0.1 % Ointment       No follow-ups on file.    Hervey Loser, PA-C

## 2023-04-20 ENCOUNTER — Ambulatory Visit (HOSPITAL_BASED_OUTPATIENT_CLINIC_OR_DEPARTMENT_OTHER): Payer: MEDICAID

## 2023-04-24 ENCOUNTER — Other Ambulatory Visit (INDEPENDENT_AMBULATORY_CARE_PROVIDER_SITE_OTHER): Payer: Self-pay | Admitting: Internal Medicine

## 2023-04-26 ENCOUNTER — Other Ambulatory Visit (INDEPENDENT_AMBULATORY_CARE_PROVIDER_SITE_OTHER): Payer: Self-pay | Admitting: Internal Medicine

## 2023-05-01 ENCOUNTER — Other Ambulatory Visit (INDEPENDENT_AMBULATORY_CARE_PROVIDER_SITE_OTHER): Payer: Self-pay | Admitting: Internal Medicine

## 2023-05-01 MED ORDER — ACCU-CHEK GUIDE GLUCOSE METER
0 refills | Status: DC
Start: 2023-05-01 — End: 2023-09-06

## 2023-05-17 ENCOUNTER — Other Ambulatory Visit (INDEPENDENT_AMBULATORY_CARE_PROVIDER_SITE_OTHER): Payer: Self-pay | Admitting: Internal Medicine

## 2023-05-26 ENCOUNTER — Other Ambulatory Visit (INDEPENDENT_AMBULATORY_CARE_PROVIDER_SITE_OTHER): Payer: Self-pay | Admitting: Internal Medicine

## 2023-05-26 MED ORDER — FUROSEMIDE 20 MG TABLET
20.0000 mg | ORAL_TABLET | Freq: Every day | ORAL | 3 refills | Status: DC
Start: 2023-05-26 — End: 2023-09-06

## 2023-05-26 MED ORDER — BETAMETHASONE VALERATE 0.1 % TOPICAL OINTMENT
TOPICAL_OINTMENT | Freq: Two times a day (BID) | CUTANEOUS | 11 refills | Status: DC
Start: 2023-05-26 — End: 2023-09-06

## 2023-06-02 ENCOUNTER — Other Ambulatory Visit (INDEPENDENT_AMBULATORY_CARE_PROVIDER_SITE_OTHER): Payer: Self-pay

## 2023-06-02 MED ORDER — AZELASTINE 137 MCG (0.1 %) NASAL SPRAY
1.0000 | Freq: Two times a day (BID) | NASAL | 0 refills | Status: AC
Start: 2023-06-02 — End: 2023-07-02

## 2023-06-02 MED ORDER — INSULIN ASPART (U-100) 100 UNIT/ML (3 ML) SUBCUTANEOUS PEN
PEN_INJECTOR | SUBCUTANEOUS | 0 refills | Status: DC
Start: 2023-06-02 — End: 2023-09-06

## 2023-06-02 MED ORDER — SUMATRIPTAN 50 MG TABLET
ORAL_TABLET | ORAL | 0 refills | Status: DC
Start: 2023-06-02 — End: 2023-09-06

## 2023-06-02 NOTE — Telephone Encounter (Signed)
 Refill requests from pharmacy. Patient must be seen prior to further refills.     Hervey Loser, PA-C

## 2023-09-06 ENCOUNTER — Ambulatory Visit (INDEPENDENT_AMBULATORY_CARE_PROVIDER_SITE_OTHER): Payer: Self-pay

## 2023-09-06 ENCOUNTER — Other Ambulatory Visit (INDEPENDENT_AMBULATORY_CARE_PROVIDER_SITE_OTHER): Payer: Self-pay | Admitting: Internal Medicine

## 2023-09-06 ENCOUNTER — Other Ambulatory Visit: Payer: MEDICAID

## 2023-09-06 ENCOUNTER — Other Ambulatory Visit: Payer: Self-pay

## 2023-09-06 VITALS — BP 148/82 | HR 105 | Temp 98.9°F | Resp 16 | Ht 65.0 in | Wt 279.2 lb

## 2023-09-06 DIAGNOSIS — Z79899 Other long term (current) drug therapy: Secondary | ICD-10-CM

## 2023-09-06 DIAGNOSIS — F419 Anxiety disorder, unspecified: Secondary | ICD-10-CM | POA: Insufficient documentation

## 2023-09-06 DIAGNOSIS — E119 Type 2 diabetes mellitus without complications: Secondary | ICD-10-CM | POA: Insufficient documentation

## 2023-09-06 DIAGNOSIS — Z0000 Encounter for general adult medical examination without abnormal findings: Secondary | ICD-10-CM

## 2023-09-06 DIAGNOSIS — E785 Hyperlipidemia, unspecified: Secondary | ICD-10-CM

## 2023-09-06 DIAGNOSIS — F17210 Nicotine dependence, cigarettes, uncomplicated: Secondary | ICD-10-CM | POA: Insufficient documentation

## 2023-09-06 LAB — COMPREHENSIVE METABOLIC PANEL, NON-FASTING
ALBUMIN: 3.5 g/dL (ref 3.5–5.0)
ALKALINE PHOSPHATASE: 107 U/L (ref 40–110)
ALT (SGPT): 13 U/L (ref <31)
ANION GAP: 9 mmol/L (ref 4–13)
AST (SGOT): 16 U/L (ref 11–34)
BILIRUBIN TOTAL: 0.5 mg/dL (ref 0.3–1.3)
BUN/CREA RATIO: 10 (ref 6–22)
BUN: 6 mg/dL — ABNORMAL LOW (ref 8–25)
CALCIUM: 8.9 mg/dL (ref 8.6–10.2)
CHLORIDE: 103 mmol/L (ref 96–111)
CO2 TOTAL: 24 mmol/L (ref 22–30)
CREATININE: 0.6 mg/dL (ref 0.60–1.05)
ESTIMATED GFR - FEMALE: >90 mL/min/BSA (ref >=60)
GLUCOSE: 202 mg/dL — ABNORMAL HIGH (ref 65–125)
POTASSIUM: 3.9 mmol/L (ref 3.7–5.3)
PROTEIN TOTAL: 7 g/dL (ref 6.0–7.9)
SODIUM: 136 mmol/L (ref 136–145)

## 2023-09-06 LAB — CBC
HCT: 51.6 % — ABNORMAL HIGH (ref 34.8–46.0)
HGB: 17.3 g/dL — ABNORMAL HIGH (ref 11.5–16.0)
MCH: 31.7 pg (ref 26.0–32.0)
MCHC: 33.5 g/dL (ref 31.0–35.5)
MCV: 94.7 fL (ref 78.0–100.0)
MPV: 11.8 fL (ref 8.7–12.5)
PLATELETS: 196 x10ˆ3/uL (ref 150–400)
RBC: 5.45 x10ˆ6/uL — ABNORMAL HIGH (ref 3.85–5.22)
RDW-CV: 14.3 % (ref 11.5–15.5)
WBC: 11.6 x10ˆ3/uL — ABNORMAL HIGH (ref 3.7–11.0)

## 2023-09-06 LAB — MICROALBUMIN/CREATININE RATIO, URINE, RANDOM
CREATININE RANDOM URINE: 223 mg/dL
MICROALBUMIN RANDOM URINE: 3.6 mg/dL
MICROALBUMIN/CREATININE RATIO RANDOM URINE: 16.1 mg/g (ref <30.0)

## 2023-09-06 LAB — HGA1C (HEMOGLOBIN A1C WITH EST AVG GLUCOSE)
ESTIMATED AVERAGE GLUCOSE: 243 mg/dL
HEMOGLOBIN A1C: 10.1 % — ABNORMAL HIGH (ref 4.0–5.6)

## 2023-09-06 LAB — LIPID PANEL
CHOL/HDL RATIO: 4.6
CHOLESTEROL: 143 mg/dL (ref 100–200)
HDL CHOL: 31 mg/dL — ABNORMAL LOW (ref >=50)
LDL CALC: 85 mg/dL (ref <100)
NON-HDL: 112 mg/dL (ref <=190)
TRIGLYCERIDES: 153 mg/dL — ABNORMAL HIGH (ref <150)
VLDL CALC: 24 mg/dL (ref <30)

## 2023-09-06 LAB — THYROID STIMULATING HORMONE (SENSITIVE TSH): TSH: 1.503 u[IU]/mL (ref 0.350–4.940)

## 2023-09-06 LAB — POCT URINE DIPSTICK
BILIRUBIN: 1
BLOOD: NEGATIVE
GLUCOSE: NEGATIVE
KETONE: 5
LEUKOCYTES: NEGATIVE
NITRITE: NEGATIVE
PH: 5.5
PROTEIN: 15
SPECIFIC GRAVITY: 1.03

## 2023-09-06 LAB — POCT HGB A1C: POCT HGB A1C: 10.5 % — AB (ref 4–6)

## 2023-09-06 LAB — URIC ACID: URIC ACID: 4.5 mg/dL (ref 2.9–6.3)

## 2023-09-06 MED ORDER — FUROSEMIDE 20 MG TABLET
20.0000 mg | ORAL_TABLET | Freq: Every day | ORAL | 3 refills | Status: DC
Start: 2023-09-06 — End: 2023-09-06

## 2023-09-06 MED ORDER — PEN NEEDLE, DIABETIC 32 GAUGE X 1/4": 3 refills | Status: DC

## 2023-09-06 MED ORDER — BETAMETHASONE VALERATE 0.1 % TOPICAL OINTMENT
TOPICAL_OINTMENT | Freq: Two times a day (BID) | CUTANEOUS | 11 refills | Status: AC
Start: 2023-09-06 — End: 2024-09-05
  Filled 2023-11-10: qty 15, 15d supply, fill #0

## 2023-09-06 MED ORDER — ACCU-CHEK GUIDE GLUCOSE METER
0 refills | Status: AC
Start: 2023-09-06 — End: ?

## 2023-09-06 MED ORDER — ALBUTEROL SULFATE 2.5 MG/3 ML (0.083 %) SOLUTION FOR NEBULIZATION
2.5000 mg | INHALATION_SOLUTION | Freq: Four times a day (QID) | RESPIRATORY_TRACT | 2 refills | Status: DC | PRN
  Filled 2023-11-10: qty 180, 15d supply, fill #0

## 2023-09-06 MED ORDER — IPRATROPIUM BROMIDE 0.02 % SOLUTION FOR INHALATION: 0.2500 mg | Freq: Four times a day (QID) | RESPIRATORY_TRACT | 0 refills | Status: DC

## 2023-09-06 MED ORDER — AZITHROMYCIN 250 MG TABLET
ORAL_TABLET | ORAL | 0 refills | Status: DC
Start: 2023-09-06 — End: 2023-11-29

## 2023-09-06 MED ORDER — EPINEPHRINE 0.3 MG/0.3 ML INJECTION, AUTO-INJECTOR: 0.3000 mg | AUTO-INJECTOR | Freq: Once | INTRAMUSCULAR | 0 refills | Status: AC | PRN

## 2023-09-06 MED ORDER — LANCETS
100.0000 | Freq: Three times a day (TID) | 3 refills | Status: DC
  Filled 2023-11-10: qty 100, 33d supply, fill #0

## 2023-09-06 MED ORDER — CETIRIZINE 10 MG TABLET
10.0000 mg | ORAL_TABLET | Freq: Every day | ORAL | 0 refills | Status: AC
Start: 2023-09-06 — End: 2023-12-05

## 2023-09-06 MED ORDER — BUDESONIDE-FORMOTEROL HFA 160 MCG-4.5 MCG/ACTUATION AEROSOL INHALER
2.0000 | INHALATION_SPRAY | Freq: Two times a day (BID) | RESPIRATORY_TRACT | 0 refills | Status: DC
Start: 2023-09-06 — End: 2023-10-25

## 2023-09-06 MED ORDER — SUMATRIPTAN 50 MG TABLET: ORAL_TABLET | ORAL | 0 refills | Status: DC

## 2023-09-06 MED ORDER — PANTOPRAZOLE 40 MG TABLET,DELAYED RELEASE: 40.0000 mg | DELAYED_RELEASE_TABLET | Freq: Two times a day (BID) | ORAL | 0 refills | Status: DC

## 2023-09-06 MED ORDER — ALBUTEROL SULFATE HFA 90 MCG/ACTUATION AEROSOL INHALER: INHALATION_SPRAY | RESPIRATORY_TRACT | 2 refills | Status: DC

## 2023-09-06 MED ORDER — NEBULIZERS
0 refills | Status: AC
Start: 2023-09-06 — End: ?

## 2023-09-06 MED ORDER — ROSUVASTATIN 20 MG TABLET
20.0000 mg | ORAL_TABLET | Freq: Every evening | ORAL | 4 refills | Status: DC
  Filled 2023-11-10: qty 90, 90d supply, fill #0

## 2023-09-06 MED ORDER — FLUTICASONE PROPIONATE 50 MCG/ACTUATION NASAL SPRAY,SUSPENSION
2.0000 | Freq: Two times a day (BID) | NASAL | 11 refills | Status: DC
Start: 2023-09-06 — End: 2023-10-13

## 2023-09-06 MED ORDER — INSULIN ASPART (U-100) 100 UNIT/ML (3 ML) SUBCUTANEOUS PEN
PEN_INJECTOR | SUBCUTANEOUS | 0 refills | Status: DC
Start: 2023-09-06 — End: 2023-11-13

## 2023-09-06 MED ORDER — FUROSEMIDE 40 MG TABLET
40.0000 mg | ORAL_TABLET | Freq: Every day | ORAL | 0 refills | Status: DC
Start: 2023-09-06 — End: 2023-10-13

## 2023-09-06 MED ORDER — AMITRIPTYLINE 50 MG TABLET: 50.0000 mg | ORAL_TABLET | Freq: Every evening | ORAL | 0 refills | Status: DC
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
## 2023-09-08 MED ORDER — LINACLOTIDE 145 MCG CAPSULE
145.0000 ug | ORAL_CAPSULE | Freq: Every day | ORAL | 0 refills | Status: AC | PRN
Start: 2023-09-08 — End: 2023-10-08

## 2023-09-11 ENCOUNTER — Other Ambulatory Visit (INDEPENDENT_AMBULATORY_CARE_PROVIDER_SITE_OTHER): Payer: Self-pay | Admitting: Internal Medicine

## 2023-09-11 NOTE — Telephone Encounter (Signed)
 Called patient to go over her labs, she asked about her gabapentin  and meds for her anxiety. I advised that I will talk to Dr. Avelino and call her back.   Yvette Gull, MA

## 2023-09-12 MED ORDER — GABAPENTIN 800 MG TABLET
ORAL_TABLET | ORAL | 0 refills | Status: DC
Start: 2023-09-12 — End: 2023-10-12

## 2023-09-15 ENCOUNTER — Other Ambulatory Visit (INDEPENDENT_AMBULATORY_CARE_PROVIDER_SITE_OTHER): Payer: Self-pay | Admitting: Internal Medicine

## 2023-09-18 ENCOUNTER — Encounter (INDEPENDENT_AMBULATORY_CARE_PROVIDER_SITE_OTHER): Payer: Self-pay

## 2023-09-18 ENCOUNTER — Telehealth (INDEPENDENT_AMBULATORY_CARE_PROVIDER_SITE_OTHER): Payer: Self-pay

## 2023-09-18 NOTE — Progress Notes (Deleted)
 "[facility]-a-Glance" Clinical Summary - Yvette Schmidt (MRN [id number])  Demographics 47-y female (DOB 10/29/1975)  PCP [facility]   Most-Recent Vitals (09/06/23) BP 148/82  HR 105  T 98.9 F  SpO? 97%  BMI 46.6 kg/m (Wt 279 lb; ?12 lb since 09/2022)   Key Past Diagnoses and Chronic Conditions Type 2 Diabetes with neuropathy - uncontrolled (A1c 10.1-10.5) COPD & Asthma Hypertension Hyperlipidemia GERD Obesity (BMI 46) OSA Anxiety, Depression, Insomnia Chronic migraines Hx MRSA abscess (09/2022)   Current Medications Insulin  aspart sliding scale BID Rosuvastatin  20 mg nightly Furosemide  40 mg daily Pantoprazole  40 mg BID Symbicort  160/4.5 BID + Albuterol  neb/MDI PRN + Atrovent  neb Gabapentin  400/400/800 mg Clonazepam  0.25 mg TID Amitriptyline  50 mg qHS Meloxicam  15 mg daily Cetirizine , Flonase , Azelastine , Betamethasone  ointment Sumatriptan  50 mg PRN migraine Azithromycin  (recent course) Linaclotide  PRN, Epinephrine  autoinjector   Labs Due Retinal exam (never done)  Foot exam (never done)  Vitamin D, B12 not documented  Repeat A1c in 3 mo (11/2023)   Imaging Due None urgent (prior chest/brain imaging normal)   Vaccines Due COVID-19 2024-25 season  Influenza 09/2023  Hep B series  Pneumococcal (PCV then PPSV per ACIP)   Outstanding Orders / Referrals Colonoscopy (never done)  Pap/HPV (never done)  Breast cancer screening (never done)   High-Priority Issues (Next Visit)  Poorly controlled DM (A1c 10.5, microalbumin 16.1) - intensify therapy/education  ASCVD risk 9.7% ? optimize statin, BP, smoking cessation  COPD/asthma management (frequent rescue use?)  Preventive care gaps: HCV, HIV, cancer screenings, vaccines  Clonazepam /gabapentin  controlled-substance monitoring (PDMP, UDS, contract)     Workflow for Upcoming Visit (Diabetes/Chronic Disease follow-up - 1 month)  1. Pre-Visit - Front-Desk  Verify Medicaid coverage for labs, colonoscopy, vaccines.  Ensure orders placed for retinal exam, colonoscopy, mammogram, Pap.  Confirm  controlled substance monitoring tasks (PDMP review, UDS).  2. Check-In - Front-Desk  Update smoking status and cessation interest.  Provide PHQ-9 and GAD-7 for mood/anxiety tracking.  Screen preventive care: eye exam, colon, breast, cervical.  3. Rooming - Medical Assistant (~15 min)  Record vitals, weight, BMI, SpO?SABRA  Review insulin  use, adherence, and hypoglycemia/hyperglycemia events.  Check inhaler/nebulizer technique and frequency.  Collect point-of-care glucose if indicated.  Review med list; reconcile controlled meds (gabapentin , clonazepam ).  Draw fasting labs if ordered (Vit D, B12, CMP, lipid if repeat).  4. Provider Encounter - PA-C Baylee Bancroft (~25 min)  Review diabetes control - consider basal insulin , GLP-1, or endocrinology referral.  Adjust hypertension/CV risk management (BP 148/82, ASCVD 9.7%).  Reinforce COPD/asthma action plan; consider PFT if not done recently.  Review mood/anxiety treatment; update PHQ-9/GAD-7.  Preventive: order Hep C/HIV, colonoscopy, mammogram, Pap, vaccines.  Controlled substances: discuss tapering/monitoring plan; review PDMP.  5. Checkout - Front-Desk / MA  Schedule follow-up in 1 month for diabetes and BP.  Arrange eye exam referral.  Provide smoking cessation resources.  Coordinate colonoscopy and mammogram scheduling.  Book flu/COVID vaccine clinic visit.  6. Billing info  CPT Codes ICD-10 Codes   [phone] - Est. pt, mod complexity E11.42 - T2DM with neuropathy   83036 - Hemoglobin A1c Z79.4 - Long-term insulin  use   36415 - Venipuncture E11.65 - T2DM, uncontrolled   82043 - Urine microalbumin I10 - Hypertension   80053 - CMP E78.2 - Mixed hyperlipidemia   19938 - Lipid panel J44.9 - COPD   94010 - Spirometry/PFT (if done) J45.909 - Asthma   99406 - Smoking cessation counseling (3-10 min) F17.210 - Nicotine dependence, cigarettes  03872 - Brief behavioral assessment (PHQ-9, GAD-7) F41.1 - GAD; F32.9 - Depression   99401 - Preventive counseling (diet, obesity,  activity) Z71.3 - Encounter for dietary counseling   [phone] - Preventive exam (if converted) Z00.00 - Encounter for adult preventive exam     Would you like me to draft a full structured assessment & plan note for Sari Grates (like I did for [facility]) with problem-based sections (Endocrine, Pulmonary, CV, Psych, Preventive), so it's ready to paste into EMR?

## 2023-09-20 ENCOUNTER — Other Ambulatory Visit (INDEPENDENT_AMBULATORY_CARE_PROVIDER_SITE_OTHER): Payer: Self-pay | Admitting: Internal Medicine
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
## 2023-10-09 ENCOUNTER — Other Ambulatory Visit: Payer: Self-pay

## 2023-10-09 ENCOUNTER — Telehealth (INDEPENDENT_AMBULATORY_CARE_PROVIDER_SITE_OTHER): Payer: MEDICAID

## 2023-10-09 ENCOUNTER — Encounter (INDEPENDENT_AMBULATORY_CARE_PROVIDER_SITE_OTHER): Payer: Self-pay

## 2023-10-09 DIAGNOSIS — K5909 Other constipation: Secondary | ICD-10-CM

## 2023-10-09 DIAGNOSIS — J019 Acute sinusitis, unspecified: Secondary | ICD-10-CM

## 2023-10-09 DIAGNOSIS — Z713 Dietary counseling and surveillance: Secondary | ICD-10-CM

## 2023-10-09 DIAGNOSIS — E119 Type 2 diabetes mellitus without complications: Secondary | ICD-10-CM

## 2023-10-09 DIAGNOSIS — F411 Generalized anxiety disorder: Secondary | ICD-10-CM

## 2023-10-09 MED ORDER — POLYETHYLENE GLYCOL 3350 17 GRAM ORAL POWDER PACKET
17.0000 g | Freq: Every day | ORAL | 0 refills | Status: AC | PRN
Start: 2023-10-09 — End: 2023-11-29

## 2023-10-09 MED ORDER — LINACLOTIDE 290 MCG CAPSULE
290.0000 ug | ORAL_CAPSULE | Freq: Every morning | ORAL | 0 refills | Status: AC
  Filled 2023-11-10: qty 60, 60d supply, fill #0

## 2023-10-09 MED ORDER — MUPIROCIN 2 % TOPICAL OINTMENT
TOPICAL_OINTMENT | Freq: Three times a day (TID) | CUTANEOUS | 0 refills | Status: AC | PRN
Start: 2023-10-09 — End: 2023-10-23

## 2023-10-09 MED ORDER — SITAGLIPTIN PHOSPHATE 100 MG TABLET
100.0000 mg | ORAL_TABLET | Freq: Every day | ORAL | 4 refills | Status: DC
  Filled 2023-11-10: qty 90, 90d supply, fill #0

## 2023-10-09 MED ORDER — AMOXICILLIN 875 MG-POTASSIUM CLAVULANATE 125 MG TABLET
1.0000 | ORAL_TABLET | Freq: Two times a day (BID) | ORAL | 0 refills | Status: AC
Start: 2023-10-09 — End: 2023-10-16

## 2023-10-10 ENCOUNTER — Other Ambulatory Visit (INDEPENDENT_AMBULATORY_CARE_PROVIDER_SITE_OTHER): Payer: Self-pay | Admitting: Internal Medicine

## 2023-10-11 MED ORDER — CLONAZEPAM 0.5 MG TABLET
0.5000 mg | ORAL_TABLET | Freq: Two times a day (BID) | ORAL | 0 refills | Status: DC
Start: 2023-10-11 — End: 2023-11-10

## 2023-10-13 ENCOUNTER — Other Ambulatory Visit (INDEPENDENT_AMBULATORY_CARE_PROVIDER_SITE_OTHER): Payer: Self-pay | Admitting: Internal Medicine

## 2023-10-13 MED ORDER — FLUTICASONE PROPIONATE 50 MCG/ACTUATION NASAL SPRAY,SUSPENSION
2.0000 | Freq: Two times a day (BID) | NASAL | 11 refills | Status: DC
  Filled 2023-11-10: qty 16, 30d supply, fill #0

## 2023-10-13 MED ORDER — FUROSEMIDE 40 MG TABLET
40.0000 mg | ORAL_TABLET | Freq: Every day | ORAL | 3 refills | Status: DC
  Filled 2023-11-10: qty 90, 90d supply, fill #0

## 2023-10-22 NOTE — Progress Notes (Deleted)
 "[facility]" Clinical Summary - Yvette Schmidt (MRN [id number])  Demographics 47-y female (DOB [birth year])  PCP Ripley Carmell Puller, MD   Most-Recent Vitals (09/06/2023) BP 148/82  HR 105  T 98.9 F  SpO? 97%  BMI 46.6 kg/m (Wt 279 lb; ? from 288 lb in 03/2023)   Key Past Diagnoses and Chronic Conditions Type 2 diabetes mellitus with peripheral neuropathy - uncontrolled (A1c 10.5%)Asthma / COPD overlapMorbid obesity & sleep apneaHypertensionGERDMigraine headachesDepression, GAD, insomniaRecurrent skin infections (MRSA+ 09/2022)   Current Medications Novolog  sliding scale insulinJanuvia 100 mg dailyRosuvastatin 20 mg qHSFurosemide 40 mg dailySymbicort, Ventolin  HFA, Atrovent , albuterol  nebAmitriptyline, clonazepam , gabapentinLinaclotide, Miralax , pantoprazolePRN: sumatriptan , epinephrine  autoinjector, meloxicam , topical abx/steroidsAntihistamines/nasal sprays   Labs Due A1c (last 09/06/23, due by 11/2023), Vitamin D, B12   Imaging Due PFT (none on record for COPD/asthma)   Vaccines Due Influenza, COVID-19 (2024-25), PCV20, Hep B series, Tdap (never documented), Shingles (>=50 y, future planning)   Outstanding Orders / Referrals  Diabetic retinal exam - never done Diabetic foot exam - never done Colonoscopy - never done Pap/HPV - never done Mammogram - never done HIV & Hep C screening - never done   High-Priority Issues (Next Visit)  Poorly controlled diabetes (A1c 10.5) ? optimize therapy, check adherence & barriers COPD/asthma - confirm inhaler adherence, consider PFT Morbid obesity & sleep apnea - counseling and weight loss pharmacotherapy options Preventive care gaps: multiple overdue cancer screens & vaccines Mental health (GAD, depression, insomnia) - monitor effectiveness & clonazepam  safety     Workflow for Upcoming Visit (Telehealth FU - 3 mo)  1. Pre-Visit - Medical sales representative on file [facility]).  Print updated med list and reconcile outside refills.  Confirm diabetes supplies coverage (test  strips, insulin  needles).  Send preventive screening reminders (eye exam, Pap, mammogram, colonoscopy).  2. Check-In - Front-Desk  Confirm smoking status, interest in cessation resources.  Update social history (alcohol, marijuana use).  Distribute PHQ-9/GAD-7 questionnaires.  3. Rooming - Medical Assistant (~15 min)  Obtain vitals, weight, BMI, SpO?SABRA  Review home glucose logs (meter/strips).  Ask about hypoglycemia episodes, insulin  adherence.  ROS: cough, wheeze, SOB, chest pain, edema, migraines, GI symptoms, mood/sleep.  Medication reconciliation, note any side effects.  Labs to order/draw: A1c, CMP, lipids, TSH, Vitamin D, B12.  Remind patient to schedule diabetic foot and eye exam.  4. Provider Encounter - PA-C Baylee Bancroft (~25 min)  Review glucose logs and reinforce insulin  technique.  Address uncontrolled A1c (consider adding/adjusting GLP-1 or SGLT2 if coverage allows).  Assess COPD/asthma - inhaler adherence, consider PFT referral.  Counsel on obesity management & OSA - weight reduction, CPAP adherence.  Discuss preventive screenings (Pap, mammogram, colonoscopy, HIV/HCV).  Vaccinations: offer flu, PCV20, COVID today if in-office; order Hep B series.  Review mental health & controlled substance compliance (clonazepam , gabapentin ) - check PDMP, note last UDS 08/2023.  5. Checkout - Front-Desk / MA  Schedule labs (if not drawn today).  Assist with referrals: eye exam, foot exam, colonoscopy, mammogram, Pap.  Schedule follow-up in 3 months for diabetes check (earlier if needed).  Provide preventive care education handouts.  6. Billing Info  CPT Codes Likely ICD-10 Codes   [phone] - FU established pt, mod complexity E11.42 - Type 2 DM w/ neuropathyE11.65 - Type 2 DM uncontrolledI10 - YUWG55.0 - RNEIG54.090 - AsthmaK21.9 - GERDE66.01 - Morbid obesityG47.33 - Sleep apneaF41.1 - GADF32.9 - DepressionZ68.41 - BMI >=40   36415 - Venipuncture for labs R73.09 - Abnormal glucoseE55.9 - Vit D  deficiency (pending labs)    [phone] - Preventive counseling, 15 min Z71.3 - Dietary counselingZ71.89 - Other counseling   [phone] - Glucose by glucose monitoring device (if done in-office) E11.65   90677 + 90471 - PCV20 vaccine admin Z23 - Immunization   90656 + 90471 - Flu vaccine Z23 - Immunization   91320 + 90480 - COVID vaccine Z23 - Immunization     Do you want me to prepare a streamlined superbill for this visit with the CPT/ICD codes pre-mapped for diabetes, COPD, obesity counseling, and vaccines, so your billing team can drop charges directly?

## 2023-10-23 ENCOUNTER — Telehealth (INDEPENDENT_AMBULATORY_CARE_PROVIDER_SITE_OTHER): Payer: MEDICAID

## 2023-10-25 ENCOUNTER — Ambulatory Visit
Admission: RE | Admit: 2023-10-25 | Discharge: 2023-10-25 | Disposition: A | Discharge status: Home / Self Care | Payer: MEDICAID | Source: Ambulatory Visit | Attending: Internal Medicine | Admitting: Internal Medicine

## 2023-10-25 ENCOUNTER — Telehealth (HOSPITAL_COMMUNITY): Payer: Self-pay | Admitting: Internal Medicine

## 2023-10-25 ENCOUNTER — Other Ambulatory Visit: Payer: Self-pay

## 2023-10-25 ENCOUNTER — Other Ambulatory Visit (INDEPENDENT_AMBULATORY_CARE_PROVIDER_SITE_OTHER): Payer: Self-pay | Admitting: Internal Medicine

## 2023-10-25 DIAGNOSIS — L97519 Non-pressure chronic ulcer of other part of right foot with unspecified severity: Secondary | ICD-10-CM | POA: Insufficient documentation

## 2023-10-25 DIAGNOSIS — L97529 Non-pressure chronic ulcer of other part of left foot with unspecified severity: Secondary | ICD-10-CM | POA: Insufficient documentation

## 2023-10-25 DIAGNOSIS — E11621 Type 2 diabetes mellitus with foot ulcer: Secondary | ICD-10-CM | POA: Insufficient documentation

## 2023-10-25 NOTE — Telephone Encounter (Signed)
 Pt is with her husband who is admitted to the hospital. Complains of worsening pain associated with her diabetic ulcers on her feet.  Have been present for several months. Has been on abx. Right foot (base of 5th toe) ulcer with necrotic base.     Will order xray of bilateral feet.    Encouraged patient to discuss with her PCP.    [address]iet [address] Jenniferlynn Saad II, MD    This note may have been partially generated using MModal Fluency Direct system, and there may be some incorrect words, spellings, and punctuation that were not noted in checking the note before saving.

## 2023-10-31 ENCOUNTER — Emergency Department (HOSPITAL_COMMUNITY)
Admission: RE | Admit: 2023-10-31 | Discharge: 2023-10-31 | Disposition: A | Discharge status: Home / Self Care | Payer: MEDICAID | Source: Ambulatory Visit

## 2023-10-31 ENCOUNTER — Other Ambulatory Visit: Payer: Self-pay

## 2023-10-31 ENCOUNTER — Encounter (HOSPITAL_COMMUNITY): Payer: Self-pay

## 2023-10-31 DIAGNOSIS — E11621 Type 2 diabetes mellitus with foot ulcer: Secondary | ICD-10-CM

## 2023-10-31 DIAGNOSIS — Z794 Long term (current) use of insulin: Secondary | ICD-10-CM

## 2023-10-31 DIAGNOSIS — F17210 Nicotine dependence, cigarettes, uncomplicated: Secondary | ICD-10-CM

## 2023-10-31 DIAGNOSIS — L97519 Non-pressure chronic ulcer of other part of right foot with unspecified severity: Secondary | ICD-10-CM

## 2023-10-31 DIAGNOSIS — Z7984 Long term (current) use of oral hypoglycemic drugs: Secondary | ICD-10-CM

## 2023-10-31 LAB — SEDIMENTATION RATE: ERYTHROCYTE SEDIMENTATION RATE (ESR): 13 mm/h (ref 0–20)

## 2023-10-31 NOTE — ED Nurses Note (Signed)
 The risk and benefits have been discussed with the patient in regards to placing a peripheral intravenous catheter (PIV) and/or drawing labs in the triage area. The patient was advised that they may have to wait in the ED waiting room after the PIV is placed. The patient was advised not to tamper with the PIV or infuse anything through the PIV. The patient was advised if there is any concern or problem with the PIV to contact a nurse or a staff representative to contact a nurse for the patient. The patient was advised if they choose to leave before being seen by a provider or without treatment; the patient needs to notify the a nurse or a staff representative to contact a nurse, so the PIV can be removed. The patient is aware not to leave the ED waiting room area while the PIV is in place. The patient verbalizes understanding and agrees with the plan of action.

## 2023-11-01 ENCOUNTER — Emergency Department
Admission: EM | Admit: 2023-11-01 | Discharge: 2023-11-01 | Disposition: A | Discharge status: Home / Self Care | Payer: MEDICAID | Attending: Emergency Medicine | Admitting: Emergency Medicine

## 2023-11-01 DIAGNOSIS — E1165 Type 2 diabetes mellitus with hyperglycemia: Secondary | ICD-10-CM | POA: Insufficient documentation

## 2023-11-01 DIAGNOSIS — I10 Essential (primary) hypertension: Secondary | ICD-10-CM | POA: Insufficient documentation

## 2023-11-01 DIAGNOSIS — L97509 Non-pressure chronic ulcer of other part of unspecified foot with unspecified severity: Secondary | ICD-10-CM

## 2023-11-01 DIAGNOSIS — R000 Tachycardia, unspecified: Secondary | ICD-10-CM | POA: Insufficient documentation

## 2023-11-01 DIAGNOSIS — L89619 Pressure ulcer of right heel, unspecified stage: Secondary | ICD-10-CM | POA: Insufficient documentation

## 2023-11-01 DIAGNOSIS — F17210 Nicotine dependence, cigarettes, uncomplicated: Secondary | ICD-10-CM | POA: Insufficient documentation

## 2023-11-01 DIAGNOSIS — E11621 Type 2 diabetes mellitus with foot ulcer: Secondary | ICD-10-CM | POA: Insufficient documentation

## 2023-11-01 HISTORY — DX: Gastroparesis: K31.84

## 2023-11-01 LAB — MANUAL DIFF AND MORPHOLOGY-SYSMEX
BASOPHIL #: <0.1 x10ˆ3/uL (ref <=0.20)
BASOPHIL %: 0 %
EOSINOPHIL #: 0.37 x10ˆ3/uL (ref <=0.50)
EOSINOPHIL %: 3 %
LYMPHOCYTE #: 3.2 x10ˆ3/uL (ref 1.00–4.80)
LYMPHOCYTE %: 26 %
MONOCYTE #: 0.25 x10ˆ3/uL (ref 0.20–1.10)
MONOCYTE %: 2 %
NEUTROPHIL #: 8.49 x10ˆ3/uL — ABNORMAL HIGH (ref 1.50–7.70)
NEUTROPHIL %: 69 %
RBC MORPHOLOGY: NORMAL

## 2023-11-01 LAB — C-REACTIVE PROTEIN(CRP),INFLAMMATION: CRP INFLAMMATION: 15.3 mg/L — ABNORMAL HIGH (ref <8.0)

## 2023-11-01 LAB — CBC WITH DIFF
HCT: 50.1 % — ABNORMAL HIGH (ref 34.8–46.0)
HGB: 17.7 g/dL — ABNORMAL HIGH (ref 11.5–16.0)
MCH: 32.4 pg — ABNORMAL HIGH (ref 26.0–32.0)
MCHC: 35.3 g/dL (ref 31.0–35.5)
MCV: 91.8 fL (ref 78.0–100.0)
PLATELETS: 131 x10ˆ3/uL — ABNORMAL LOW (ref 150–400)
RBC: 5.46 x10ˆ6/uL — ABNORMAL HIGH (ref 3.85–5.22)
RDW-CV: 13.5 % (ref 11.5–15.5)
WBC: 12.3 x10ˆ3/uL — ABNORMAL HIGH (ref 3.7–11.0)

## 2023-11-01 LAB — BASIC METABOLIC PANEL
ANION GAP: 10 mmol/L (ref 4–13)
BUN/CREA RATIO: 21 (ref 6–22)
BUN: 16 mg/dL (ref 8–25)
CALCIUM: 9.3 mg/dL (ref 8.6–10.2)
CHLORIDE: 101 mmol/L (ref 96–111)
CO2 TOTAL: 24 mmol/L (ref 22–30)
CREATININE: 0.75 mg/dL (ref 0.60–1.05)
GLUCOSE: 335 mg/dL — ABNORMAL HIGH (ref 65–125)
POTASSIUM: 3.8 mmol/L (ref 3.5–5.1)
SODIUM: 135 mmol/L — ABNORMAL LOW (ref 136–145)
eGFRcr - FEMALE: >90 mL/min/1.73mˆ2 (ref >=60)

## 2023-11-01 MED ORDER — DEXTROSE 5 % IN WATER (D5W) INTRAVENOUS SOLUTION
1500.0000 mg | Freq: Once | INTRAVENOUS | Status: AC
Start: 2023-11-01 — End: 2023-11-01
  Administered 2023-11-01: 1500 mg via INTRAVENOUS
  Administered 2023-11-01: 0 mg via INTRAVENOUS
  Filled 2023-11-01: qty 75

## 2023-11-01 NOTE — ED Nurses Note (Signed)

## 2023-11-01 NOTE — ED Provider Notes (Signed)
 [facility] - Emergency Department   Attending Note    Identification:  Name: Yvette Schmidt  Age and Gender: 48 y.o. female  Date of Birth: Nov 09, 1975  Date of Service: 11/01/2023  MRN: [id number]  PCP: Ripley Carmell Puller, MD    Code Status: full    Arrival: The patient arrived by private car and is alone.    History Obtained from: Patient. EMR.     History Limitations: none    CC:  Chief Complaint   Patient presents with    Wound Infection     Patient presents to ER via wheelchair with c/o right foot pressure wound x 1 month. Patient has been following PCP for wound care.      HPI:  Yvette Schmidt is a 48 y.o. White female presenting via private vehicle with right foot pressure wound.  Onset of symptoms 1 month prior.  Patient has been following with primary care physician for wound care.  Patient has been in the hospital with her husband in his not been following with primary care physician for her wound care.  Patient notes worsening of the ulceration.  Past medical history significant for hypertension, diabetes, asthma, Chronic Obstructive Pulmonary Disease, gastroesophageal reflux disease.  Past surgical history significant for hysterectomy, cholecystectomy.  Patient smokes half a pack of cigarettes daily.SABRA    Historical Data    Medications:  Medications reviewed with patient/patient's family.  Medications Prior to Admission       Prescriptions    ACCU-CHEK GUIDE GLUCOSE METER Does not apply Misc    Use three times daily with lancets and test strips    ACCU-CHEK GUIDE TEST STRIPS Does not apply Strip    USE 1 STRIP TO CHECK BLOOD SUGAR FOUR TIMES DAILY BEFORE MEALS AND AT BEDTIME    albuterol  sulfate (PROVENTIL ) 2.5 mg /3 mL (0.083 %) Inhalation nebulizer solution    Take 3 mL (2.5 mg total) by nebulization Every 6 hours as needed for Wheezing    albuterol  sulfate (VENTOLIN  HFA) 90 mcg/actuation Inhalation oral inhaler    INHALE 1-2 PUFFS BY INHALATION EVERY 6 HOURS AS NEEDED    amitriptyline   (ELAVIL ) 50 mg Oral Tablet    Take 1 Tablet (50 mg total) by mouth Every night for 90 days    amoxicillin -pot clavulanate (AUGMENTIN ) 875-125 mg Oral Tablet    Take 1 Tablet by mouth Twice daily for 7 days    azelastine  (ASTELIN ) 137 mcg (0.1 %) Nasal Spray, Non-Aerosol    ADMINISTER 1 SPRAY INTO EACH NOSTRIL TWICE DAILY FOR 30 DAYS    azithromycin  (ZITHROMAX ) 250 mg Oral Tablet    Take 500 mg (2 tab) on day 1; take 250 mg (1 tab) on days 2-5.    betamethasone  valerate (VALISONE ) 0.1 % Ointment    Apply topically Twice daily    cetirizine  (ZYRTEC ) 10 mg Oral Tablet    Take 1 Tablet (10 mg total) by mouth Daily for 90 days    clonazePAM  (KLONOPIN ) 0.5 mg Oral Tablet    Take 1 Tablet (0.5 mg total) by mouth Twice daily    EPINEPHrine  0.3 mg/0.3 mL Injection Auto-Injector    Inject 0.3 mL (0.3 mg total) into the muscle Once, as needed for up to 2 doses    fluticasone  propionate (FLONASE ) 50 mcg/actuation Nasal Spray, Suspension    Administer 2 Sprays into each nostril Twice daily    furosemide  (LASIX ) 40 mg Oral Tablet    Take 1 Tablet (40 mg  total) by mouth Daily for 90 days    gabapentin  (NEURONTIN ) 800 mg Oral Tablet    TAKE 1/2 TABLET (400MG ) BY MOUTH IN THE MORNING AND AFTERNOON THEN 1 TABLET (800MG ) IN THE EVENING    insulin  aspart U-100 (NOVOLOG ) 100 unit/mL (3 mL) Subcutaneous Insulin  Pen    INJECT UNDER THE SKIN TWICE A DAY BEFORE MEALS PER SLIDING SCALE <150 DO NOT TAKE 150-200 2 UNITS 201-250 4 UNITS 251-300 6 UNITS 301-350 9 UNITS 351-400 12 UNITS    Insulin  Needles, Disposable, (SURE COMFORT PEN NEEDLE) 32 gauge x 1/4 Needle    USE TO INJECT INSULIN  TWICE DAILY    ipratropium (ATROVENT ) 0.02 % Inhalation Solution    Take 1.25 mL (0.25 mg total) by nebulization Every 6 hours    Lancets (ACCU-CHEK SOFTCLIX LANCETS) Misc    100 Each Three times a day    linaCLOtide  (LINZESS ) 145 mcg Oral Capsule    Take 1 Capsule (145 mcg total) by mouth Once per day as needed for up to 30 days    linaCLOtide  (LINZESS ) 290  mcg Oral Capsule    Take 1 Capsule (290 mcg total) by mouth Every morning for 90 days    meloxicam  (MOBIC ) 15 mg Oral Tablet    TAKE 1 TABLET BY MOUTH EVERY DAY AT BEDTIME    mupirocin  (BACTROBAN ) 2 % Ointment    Apply topically Three times a day as needed for up to 14 days    Nebulizers Misc    Use with Nebulizer medication    pantoprazole  (PROTONIX ) 40 mg Oral Tablet, Delayed Release (E.C.)    Take 1 Tablet (40 mg total) by mouth Twice daily    polyethylene glycol (MIRALAX ) 17 gram Oral Powder in Packet    Take 1 Packet (17 g total) by mouth Once per day as needed for up to 30 days    rosuvastatin  (CRESTOR ) 20 mg Oral Tablet    Take 1 Tablet (20 mg total) by mouth Every evening    saccharomyces boulardii (FLORASTOR) capsule    SITagliptin  phosphate (JANUVIA ) 100 mg Oral Tablet    Take 1 Tablet (100 mg total) by mouth Daily    SUMAtriptan  (IMITREX ) 50 mg Oral Tablet    TAKE 1 TABLET BY MOUTH ONCE DAILY AS NEEDED FOR MIGRAINES MAY REPEAT IN 2 HOURS IF NEEDED FOR 30 DAYS DO NOT EXCEED MORE THAN 9 DOSES IN A 30 DAY PERIOD    SYMBICORT  160-4.5 mcg/actuation Inhalation oral inhaler    TAKE 2 PUFFS BY INHALATION TWICE DAILY            Allergies:  Allergies reviewed with patient/patient's family.  Allergies[1]    PMSFSH:  Past medical, surgical, family and social history reviewed with patient/patient's family.  Past Medical History:   Diagnosis Date    Asthma     COPD (chronic obstructive pulmonary disease)     Diabetes mellitus, type 2     Gastroparesis     GERD (gastroesophageal reflux disease)     Hypertension     MRSA (methicillin resistant staph aureus) culture positive 10/10/2022    Right buttock wound     Past Surgical History:   Procedure Laterality Date    Hx hysterectomy  2012    Hx lap cholecystectomy  05/12/2017    Pb colonoscopy,diagnostic       No family history on file.  Social History     Socioeconomic History    Marital status: Married     Spouse name:  Not on file    Number of children: Not on file     Years of education: Not on file    Highest education level: Not on file   Occupational History    Not on file   Tobacco Use    Smoking status: Every Day     Current packs/day: 0.50     Average packs/day: 0.5 packs/day for 15.0 years (7.5 ttl pk-yrs)     Types: Cigarettes    Smokeless tobacco: Never   Substance and Sexual Activity    Alcohol use: Not Currently     Comment: seldom    Drug use: Not Currently     Types: Marijuana     Comment: occasionally    Sexual activity: Not on file   Other Topics Concern    Not on file   Social History Narrative    Not on file     Social Determinants of Health     Financial Resource Strain: Not on file   Transportation Needs: Not on file   Social Connections: Not on file   Intimate Partner Violence: Not on file   Housing Stability: Not on file     Physical Exam   Pertinent Physical Exam:   All nurse's notes reviewed.  BP (!) 140/93   Pulse (!) 105   Temp 36.8 C (98.2 F)   Resp 20   Ht 1.651 m (5' 5)   Wt 133 kg (292 lb 8.8 oz)   SpO2 91%   BMI 48.68 kg/m   Constitutional: NAD. A&Ox3. Good historian.   Head: NC/AT. MMM. PERRLA. EOMI.  Heart: Tachycardic and regular. No m/r/g.  Lungs: No distress.  Musculoskeletal: No deformity or edema.  Skin: Warm and dry. No visible jaundice, cyanosis.  Ulceration to the lateral aspect of the foot [see image below]  Neurologic: CNs grossly intact. Neurovascularly intact bilaterally. No focal neurological deficits.  Psychiatric: Normal affect and mood.        Patient Data   Orders:  Results up to the Time the Disposition was Entered   BASIC METABOLIC PANEL - Abnormal; Notable for the following components:       Result Value    SODIUM 135 (*)     GLUCOSE 335 (*)     All other components within normal limits   C-REACTIVE PROTEIN(CRP),INFLAMMATION - Abnormal; Notable for the following components:    CRP INFLAMMATION 15.3 (*)     All other components within normal limits   CBC WITH DIFF - Abnormal; Notable for the following components:     WBC 12.3 (*)     RBC 5.46 (*)     HGB 17.7 (*)     HCT 50.1 (*)     MCH 32.4 (*)     PLATELETS 131 (*)     All other components within normal limits    Narrative:     Mean Platelet Volume - Not Reported   MANUAL DIFF AND MORPHOLOGY-SYSMEX - Abnormal; Notable for the following components:    NEUTROPHIL # 8.49 (*)     All other components within normal limits   SEDIMENTATION RATE - Normal   CBC/DIFF    Narrative:     The following orders were created for panel order CBC/DIFF.  Procedure                               Abnormality  Status                     ---------                               -----------         ------                     CBC WITH DIFF[[id number]]                Abnormal            Final result               MANUAL DIFF AND MORPHOLO.SABRASABRA[[id number]]  Abnormal            Final result                 Please view results for these tests on the individual orders.   XR FOOT RIGHT    Narrative:     MRS. Iveth D Worrall    PROCEDURE DESCRIPTION: XR FOOT RIGHT    CLINICAL INDICATION: pressure wound    TECHNIQUE: 3 views / 3 images submitted.    COMPARISON: 10/25/2023     dalbavancin (DALVANCE) 1,500 mg in D5W 250 mL IVPB (has no administration in time range)     Medical Decision Making    Course:   Patient seen and examined.  Vital signs show tachycardia.  Physical exam as above.  X-ray imaging shows no radiographic evidence of osteomyelitis.  No subcutaneous gas noted.  Baseline blood work shows hyperglycemia without evidence of DKA.  Mild leukocytosis 12.3.  Slight elevation of CRP at 15.3.  Results were discussed with patient.  She was given the opportunity to ask questions.  Patient requesting discharge home.  Patient given dose of Dalvance.  Plan to follow up primary care physician within the next 2-3 days and with Podiatry as referred.  All questions answered.  Patient agreeable to plan.    Consults:   None     Impression:   Clinical Impression   Ulcer of foot, unspecified laterality, unspecified  ulcer stage (CMS HCC) (Primary)     Disposition:    Discharged. Patient/patient's family was advised to return to the ED with any new, concerning or worsening symptoms and follow up as directed. The patient/patient's family verbalized understanding of all instructions and had no further questions or concerns.     Prentice Cam, MD  11/01/2023, 00:32  [facility]    Parts of this patient's chart was completed in a retrospective fashion due to simultaneous direct patient care in the Emergency Department.   This note was partially generated using MModal Fluency Direct system, and there may be some incorrect syntax, spelling, and punctuation that were not noted before saving. In such instances, original meaning may be extrapolated by contextual derivation.         [1]   Allergies  Allergen Reactions    Beeswax      Allergic to Bee Stings    Clindamycin     Hydrocodone     Other      Nutrasweet, sugar subs    Sulfa (Sulfonamides)      HIVES    Theophylline      ANXIETY

## 2023-11-06 ENCOUNTER — Other Ambulatory Visit: Payer: Self-pay

## 2023-11-06 ENCOUNTER — Telehealth (HOSPITAL_COMMUNITY): Payer: Self-pay | Admitting: Internal Medicine

## 2023-11-06 MED ORDER — AMOXICILLIN 875 MG-POTASSIUM CLAVULANATE 125 MG TABLET
1.0000 | ORAL_TABLET | Freq: Two times a day (BID) | ORAL | 0 refills | Status: DC
Start: 2023-11-06 — End: 2023-11-29
  Filled 2023-11-06: qty 14, 7d supply, fill #0

## 2023-11-06 NOTE — Telephone Encounter (Signed)
 Pt's husband is currently in the ICU and she has diabetic ulcer.  Foul smelling. Imaging last week with no evidence of osteomyelitis. Will give course of Augmentin  x 7 days.    Yvette Lajeunesse Lane Sten Dematteo II, MD    This note may have been partially generated using MModal Fluency Direct system, and there may be some incorrect words, spellings, and punctuation that were not noted in checking the note before saving.

## 2023-11-10 ENCOUNTER — Other Ambulatory Visit (INDEPENDENT_AMBULATORY_CARE_PROVIDER_SITE_OTHER): Payer: Self-pay | Admitting: Internal Medicine

## 2023-11-10 ENCOUNTER — Other Ambulatory Visit: Payer: Self-pay

## 2023-11-10 MED FILL — blood sugar diagnostic strips: 25 days supply | Qty: 100 | Fill #0 | Status: AC

## 2023-11-13 ENCOUNTER — Other Ambulatory Visit: Payer: Self-pay

## 2023-11-13 ENCOUNTER — Other Ambulatory Visit (INDEPENDENT_AMBULATORY_CARE_PROVIDER_SITE_OTHER): Payer: Self-pay

## 2023-11-13 MED ORDER — INSULIN ASPART (U-100) 100 UNIT/ML (3 ML) SUBCUTANEOUS PEN
PEN_INJECTOR | Freq: Two times a day (BID) | SUBCUTANEOUS | 0 refills | Status: DC
  Filled 2023-11-13: qty 15, 62d supply, fill #0

## 2023-11-13 MED ORDER — CLONAZEPAM 0.5 MG TABLET
0.5000 mg | ORAL_TABLET | Freq: Two times a day (BID) | ORAL | 0 refills | Status: DC
  Filled 2023-11-13: qty 45, 23d supply, fill #0

## 2023-11-13 MED ORDER — GABAPENTIN 800 MG TABLET
ORAL_TABLET | ORAL | 0 refills | Status: DC
  Filled 2023-11-13: qty 60, 30d supply, fill #0

## 2023-11-20 ENCOUNTER — Other Ambulatory Visit: Payer: Self-pay

## 2023-11-29 ENCOUNTER — Other Ambulatory Visit: Payer: Self-pay

## 2023-11-29 ENCOUNTER — Ambulatory Visit (INDEPENDENT_AMBULATORY_CARE_PROVIDER_SITE_OTHER): Payer: MEDICAID

## 2023-11-29 ENCOUNTER — Other Ambulatory Visit: Payer: MEDICAID

## 2023-11-29 VITALS — BP 148/80 | HR 89 | Temp 97.7°F | Resp 16 | Ht 65.0 in | Wt 292.0 lb

## 2023-11-29 DIAGNOSIS — L089 Local infection of the skin and subcutaneous tissue, unspecified: Secondary | ICD-10-CM | POA: Insufficient documentation

## 2023-11-29 DIAGNOSIS — I10 Essential (primary) hypertension: Secondary | ICD-10-CM

## 2023-11-29 DIAGNOSIS — Z716 Tobacco abuse counseling: Secondary | ICD-10-CM

## 2023-11-29 DIAGNOSIS — F17200 Nicotine dependence, unspecified, uncomplicated: Secondary | ICD-10-CM

## 2023-11-29 DIAGNOSIS — Z6841 Body Mass Index (BMI) 40.0 and over, adult: Secondary | ICD-10-CM

## 2023-11-29 DIAGNOSIS — E119 Type 2 diabetes mellitus without complications: Secondary | ICD-10-CM

## 2023-11-29 DIAGNOSIS — E11628 Type 2 diabetes mellitus with other skin complications: Secondary | ICD-10-CM

## 2023-11-29 MED ORDER — METRONIDAZOLE 500 MG TABLET
500.0000 mg | ORAL_TABLET | Freq: Three times a day (TID) | ORAL | 0 refills | Status: DC
Start: 2023-11-29 — End: 2023-12-11

## 2023-11-29 MED ORDER — MUPIROCIN 2 % TOPICAL OINTMENT
TOPICAL_OINTMENT | Freq: Three times a day (TID) | CUTANEOUS | 3 refills | Status: AC
Start: 2023-11-29 — End: 2023-12-29

## 2023-11-29 MED ORDER — CIPROFLOXACIN 750 MG TABLET
750.0000 mg | ORAL_TABLET | Freq: Two times a day (BID) | ORAL | 0 refills | Status: DC
Start: 2023-11-29 — End: 2023-12-11

## 2023-11-29 NOTE — Progress Notes (Signed)
 [facility]  [address] [place] [id number]    Progress Note    Patient Information:  Name: Yvette Schmidt   DOB: 09/25/1975  [48 y.o. female]   MRN: [id number]       Visit Date: 11/29/2023   Referring: No referring provider defined for this encounter.   PCP: Ripley Carmell Puller, MD           Chief Complaint              Foot Ulcer Right - has wound care appt 12/05/2023               SUBJECTIVE  Chief Complaint:  Foot Ulcer - Right foot, has wound care appointment 12/05/2023  History of Present Illness (HPI):  Mccayla Shimada presents with worsening foot pain and infection that began when her husband Glendia was hospitalized from September 29th to October 29th. She received antibiotics both intravenously and orally during that time period and visited the emergency room twice while her husband was hospitalized. The pain has significantly worsened over the past month since her husband's hospitalization, now radiating up into her leg. She describes the affected area as involving her right lateral foot and right pinky toe, which appears purple with deep ulceration and erythema. She was last evaluated by a physician in late October while her husband was hospitalized, when doctors would examine both her and her husband during visits. She is scheduled to see wound care on Tuesday, as recommended by Dr. Chalmer. The patient refuses emergency room evaluation due to concerns about potential admission, as she needs to care for her husband who recently had toe amputations. She reports no fever, chills, or systemic symptoms at this visit. She applies wound care products and gauze dressing to the affected area.  Review of Systems (ROS):  General: Denies fever, chills, or malaise  Cardiac: No chest pain, palpitations  Pulmonary: No shortness of breath or cough  GI: No nausea, vomiting, or abdominal pain  MSK: Reports pain and swelling in right foot extending into leg  Neuro: No numbness or tingling beyond  known diabetic neuropathy  Current Outpatient Medications   Medication Sig    ACCU-CHEK GUIDE GLUCOSE METER Does not apply Misc Use three times daily with lancets and test strips    ACCU-CHEK SOFTCLIX LANCETS Misc USE TO TEST BLOOD SUGAR THREE TIMES DAILY    albuterol  sulfate (PROVENTIL ) 2.5 mg /3 mL (0.083 %) Inhalation nebulizer solution Take 3 mL (2.5 mg total) by nebulization Three times a day as needed for Wheezing    albuterol  sulfate (VENTOLIN  HFA) 90 mcg/actuation Inhalation oral inhaler INHALE 1-2 PUFFS BY INHALATION EVERY 6 HOURS AS NEEDED    amitriptyline  (ELAVIL ) 50 mg Oral Tablet Take 1 Tablet (50 mg total) by mouth Every night    aspirin  81 mg Oral Tablet, Chewable Chew 1 Tablet (81 mg total) Daily    azelastine  (ASTELIN ) 137 mcg (0.1 %) Nasal Spray, Non-Aerosol ADMINISTER 1 SPRAY INTO EACH NOSTRIL TWICE DAILY FOR 30 DAYS    betamethasone  valerate (VALISONE ) 0.1 % Ointment Apply topically Twice daily (Patient taking differently: Apply 1 Each topically Twice daily. AREAS OF DRY SKIN  Morning and evening  Indications: OTHER)    Blood Sugar Diagnostic (ACCU-CHEK GUIDE TEST STRIPS) Does not apply Strip USE 1 STRIP TO CHECK BLOOD SUGAR FOUR TIMES DAILY BEFORE MEALS AND AT BEDTIME    budesonide -formoteroL  (SYMBICORT ) 160-4.5 mcg/actuation Inhalation oral inhaler Take 2 Puffs by inhalation Twice daily  cephalexin  (KEFLEX ) 500 mg Oral Capsule     clonazePAM  (KLONOPIN ) 0.5 mg Oral Tablet Take 1 Tablet (0.5 mg total) by mouth Twice daily    EPINEPHrine  0.3 mg/0.3 mL Injection Auto-Injector Inject 0.3 mL (0.3 mg total) into the muscle Once, as needed for up to 2 doses    fluticasone  propionate (FLONASE ) 50 mcg/actuation Nasal Spray, Suspension Administer 2 Sprays into each nostril Twice daily    furosemide  (LASIX ) 40 mg Oral Tablet Take 1 Tablet (40 mg total) by mouth Daily    gabapentin  (NEURONTIN ) 800 mg Oral Tablet Take 1/2 tablet by mouth in the morning and afternoon, then take 1 tablet in the evening.     Ibuprofen  (MOTRIN ) 200 mg Oral Tablet Take 4 Tablets (800 mg total) by mouth Four times a day as needed for Pain    insulin  aspart U-100 (NOVOLOG ) 100 unit/mL (3 mL) Subcutaneous Insulin  Pen Inject 20 Units under the skin Twice a day before meals per Sliding Scale. <150 No injection, 150-200 2 units, 201-250 4 units, 251-300 6 units, 301-350 9 units, 351-400 12 units    Insulin  Needles, Disposable, (SURE COMFORT PEN NEEDLE) 32 gauge x 1/4 Needle USE TO INJECT INSULIN  TWICE DAILY    ipratropium (ATROVENT ) 0.02 % Inhalation Solution Take 1.25 mL (0.25 mg total) by nebulization Every 6 hours    itraconazole  (SPORONAX) 100 mg Oral Capsule Take 2 Capsules (200 mg total) by mouth Daily for 180 days    meloxicam  (MOBIC ) 15 mg Oral Tablet Take 1 Tablet (15 mg total) by mouth Every night    mineral oil-hydrophil petrolat (AQUAPHOR) Ointment Apply topically Four times a day as needed    NaCl-Zn Ac-Vit B6-Citric Acid (WOUND CLEANSER) Irrigation Spray, Non-Aerosol Irrigate with 1 Spray as directed Once per day as needed (with dressing changes)    Nebulizers Misc Use with Nebulizer medication    pantoprazole  (PROTONIX ) 40 mg Oral Tablet, Delayed Release (E.C.) Take 1 Tablet (40 mg total) by mouth Twice daily    posaconazole  (NOXAFIL ) 100 mg Oral Tablet, Delayed Release (E.C.) Take 3 Tablets (300 mg total) by mouth Daily for 180 days    rosuvastatin  (CRESTOR ) 20 mg Oral Tablet Take 1 Tablet (20 mg total) by mouth Every evening    sennosides-docusate sodium  (SENOKOT-S) 8.6-50 mg Oral Tablet Take 1 Tablet by mouth Twice daily for 30 days    SITagliptin  phosphate (JANUVIA ) 100 mg Oral Tablet Take 1 Tablet (100 mg total) by mouth Once a day    SUMAtriptan  (IMITREX ) 50 mg Oral Tablet TAKE 1 TABLET BY MOUTH ONCE DAILY AS NEEDED FOR MIGRAINES MAY REPEAT IN 2 HOURS IF NEEDED FOR 30 DAYS DO NOT EXCEED MORE THAN 9 DOSES IN A 30 DAY PERIOD    vitamin B complex  Oral Tablet Take 1 Tablet by mouth Daily       OBJECTIVE   Vitals:    11/29/23  1641   BP: (!) 148/80   Pulse: 89   Resp: 16   Temp: 36.5 C (97.7 F)   SpO2: 98%   Weight: 132 kg (292 lb)   Height: 1.651 m (5' 5)   BMI: 48.59         Wt Readings from Last 3 Encounters:   01/15/24 133 kg (293 lb)   12/19/23 (!) 138 kg (303 lb 2.1 oz)   12/19/23 134 kg (295 lb)     Physical Examination  General: Alert, cooperative, in mild distress due to pain  Skin/Extremities: Right lateral foot with extensive  ulceration; toe appears purple and dusky with deep ulceration and erythema surrounding wound margins. No active drainage noted.   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 The patient presents with a severe diabetic foot infection characterized by erythema, deep ulceration to the right lateral foot and pinky toe, with the pinky toe appearing purple, raising concern for possible osteomyelitis given the depth and severity of the infection. A wound culture was obtained to identify the causative organism and guide antibiotic therapy. The patient was strongly advised to seek emergency department evaluation due to concern that the infection may have extended into the bone, which would require intravenous antibiotic therapy  that cannot be adequately managed in the outpatient setting. Despite the severity of the presentation and clinical recommendation for emergency care, the patient declined hospital evaluation due to her need to care for her hospitalized husband. Given her refusal of appropriate emergency care and the high risk nature of the infection, outpatient oral antibiotics were prescribed as a compromise measure to mitigate risk, though this approach is suboptimal for the suspected severity of infection.  1. Musculoskeletal System  Diagnosis(es)  A. Severe diabetic foot infection with ulceration, right lateral foot and pinky toe (E11.628, L08.9)  Acute severe diabetic foot infection presenting with deep ulceration to the right lateral foot and right pinky toe with significant erythema. Patient reports pain has worsened significantly since her husband's hospitalization in October, now extending up into her leg. Physical examination reveals purple discoloration of the pinky toe with deep ulceration and erythema. No fever (temperature normal), chills, or systemic symptoms present. Patient was strongly advised for emergency room evaluation due to concern for possible osteomyelitis but declined due to need to care for hospitalized husband. Wound culture was obtained via swab. Patient has been applying wound care products with gauze dressing.  Laboratory and Radiological Results  WBC 12.3 (H), Hgb 17.7 (H), Hct 50.1 (H), Platelets 131 (L) on 10/31/23  Wound culture obtained 11/29/23 pending results  Medications  NEW: Ciprofloxacin  750 mg PO BID 10 days  NEW: Metronidazole  500 mg PO TID 10 days  NEW: Mupirocin  2% ointment topically TID 30 days  Primary & Secondary Care Gaps  Wound care referral scheduled 12/05/23  Declined emergency department admission despite medical advice  Diabetic foot and retinal exams overdue  Plan  Advised patient on high risk for osteomyelitis; strongly recommended that she present to the ED. Patient  unwilling due to need to care for her husband. Discussed concern for oseomyelitis and implications of possible limb loss, sepsis or even death. Patient still unwilling to present to the ED at this time. In an effort to mitigate disease will obtain wound culture and begin emperic antibiotics. Urged patient that if she begins experiencing fevers, chills, nausea, vomiting, diaphoresis  or any other concerning symptoms to present to the ED. She expressed understanding   Wound culture obtained for aerobic/gram stain analysis  Broad-spectrum oral antibiotics (ciprofloxacin  + metronidazole ) initiated for diabetic foot infection coverage  Wound dressing applied in clinic  Scheduled follow-up with wound care clinic for debridement and advanced dressing  Documented refusal of ER admission for medical-legal compliance    2. Endocrine System  Diagnosis(es)  A. Type 2 diabetes mellitus with complications (E11.9)  Diabetes mellitus complicated by severe foot ulceration and infection with concern for possible osteomyelitis. Recent A1C 10.5 (09/06/2023) showing poor glycemic control. No hypoglycemia reported. Patient continues on basal-bolus insulin  and oral hypoglycemics.  Laboratory Results  A1C 10.5% (09/06/23)  Fasting glucose 335 mg/dL (89/92/74)  Creatinine 0.75 mg/dL, eGFR >09  Microalbumin/Cr ratio 16.1 (09/06/23)  Medications  SITagliptin  100 mg PO daily  Insulin  aspart sliding scale  Continue home monitoring with Accu-Chek 4x daily  Primary & Secondary Care Gaps  Overdue: Retinal exam, foot exam, A1C recheck (due 12/07/23)  Counseling on diet and lifestyle provided  Plan  Reinforced glycemic control importance for wound healing  Continue insulin  sliding scale and Januvia   Advised to maintain log of blood sugars and contact office if persistent readings >250 mg/dL  Labs to be repeated in 1 month (CMP, A1C)  Follow up with endocrinology if A1C remains uncontrolled      ENCOUNTER DIAGNOSES     ICD-10-CM   1. Diabetic foot  infection (CMS HCC)  (CMS HCC)  E11.628    L08.9   2. Tobacco use disorder [F17.200]  F17.200   3. DM2 (diabetes mellitus, type 2)  E11.9   4. Hypertension, unspecified type  I10   5. Morbid obesity with BMI of 45.0-49.9, adult (CMS HCC)  E66.01    Z68.42       Medication Orders   Medications    DISCONTD: ciprofloxacin  HCl (CIPRO ) 750 mg Oral Tablet     Sig: Take 1 Tablet (750 mg total) by mouth Twice daily for 10 days     Dispense:  20 Tablet     Refill:  0    DISCONTD: metroNIDAZOLE  (FLAGYL ) 500 mg Oral Tablet     Sig: Take 1 Tablet (500 mg total) by mouth Three times a day for 10 days     Dispense:  30 Tablet     Refill:  0    mupirocin  (BACTROBAN ) 2 % Ointment     Sig: Apply topically Three times a day for 30 days     Dispense:  30 g     Refill:  3     Orders Placed This Encounter   Procedures    WOUND, SUPERFICIAL/NON-STERILE SITE, AEROBIC CULTURE AND GRAM STAIN     Standing Status:   Future     Number of Occurrences:   1     Expiration Date:   01/28/2025     Body Site::   Right foot     Release to patient:   Automated [100001]         No follow-ups on file.    Gillermo Cambridge, PA-C

## 2023-11-30 LAB — WOUND, SUPERFICIAL/NON-STERILE SITE, AEROBIC CULTURE AND GRAM STAIN

## 2023-12-02 ENCOUNTER — Encounter (HOSPITAL_COMMUNITY): Payer: Self-pay

## 2023-12-02 ENCOUNTER — Emergency Department
Admission: EM | Admit: 2023-12-02 | Discharge: 2023-12-03 | Payer: MEDICAID | Attending: Emergency Medicine | Admitting: Emergency Medicine

## 2023-12-02 ENCOUNTER — Emergency Department (HOSPITAL_COMMUNITY): Payer: MEDICAID

## 2023-12-02 ENCOUNTER — Emergency Department (HOSPITAL_COMMUNITY)
Admission: RE | Admit: 2023-12-02 | Discharge: 2023-12-02 | Disposition: A | Discharge status: Home / Self Care | Payer: MEDICAID | Source: Ambulatory Visit

## 2023-12-02 ENCOUNTER — Other Ambulatory Visit: Payer: Self-pay

## 2023-12-02 DIAGNOSIS — Z6841 Body Mass Index (BMI) 40.0 and over, adult: Secondary | ICD-10-CM | POA: Insufficient documentation

## 2023-12-02 DIAGNOSIS — E1165 Type 2 diabetes mellitus with hyperglycemia: Secondary | ICD-10-CM | POA: Insufficient documentation

## 2023-12-02 DIAGNOSIS — Z5329 Procedure and treatment not carried out because of patient's decision for other reasons: Secondary | ICD-10-CM | POA: Insufficient documentation

## 2023-12-02 DIAGNOSIS — M869 Osteomyelitis, unspecified: Secondary | ICD-10-CM | POA: Insufficient documentation

## 2023-12-02 DIAGNOSIS — R000 Tachycardia, unspecified: Secondary | ICD-10-CM | POA: Insufficient documentation

## 2023-12-02 DIAGNOSIS — E669 Obesity, unspecified: Secondary | ICD-10-CM | POA: Insufficient documentation

## 2023-12-02 DIAGNOSIS — L03115 Cellulitis of right lower limb: Secondary | ICD-10-CM | POA: Insufficient documentation

## 2023-12-02 DIAGNOSIS — R609 Edema, unspecified: Secondary | ICD-10-CM | POA: Insufficient documentation

## 2023-12-02 LAB — SEDIMENTATION RATE: ERYTHROCYTE SEDIMENTATION RATE (ESR): 32 mm/h — ABNORMAL HIGH (ref 0–20)

## 2023-12-02 LAB — BLOOD GAS
%FIO2 (VENOUS): 21 %
BASE EXCESS: 4.1 mmol/L — ABNORMAL HIGH (ref 0.0–3.0)
BICARBONATE (VENOUS): 28 mmol/L (ref 22.0–29.0)
O2 SATURATION (VENOUS): 92 % (ref 40.0–85.0)
PCO2 (VENOUS): 36 mmHg — ABNORMAL LOW (ref 41–51)
PH (VENOUS): 7.49 — ABNORMAL HIGH (ref 7.32–7.43)
PO2 (VENOUS): 58 mmHg (ref 35–50)

## 2023-12-02 LAB — CBC WITH DIFF
BASOPHIL #: <0.1 x10ˆ3/uL (ref <=0.20)
BASOPHIL %: 0.6 %
EOSINOPHIL #: 0.33 x10ˆ3/uL (ref <=0.50)
EOSINOPHIL %: 2.1 %
HCT: 45.5 % (ref 34.8–46.0)
HGB: 15.5 g/dL (ref 11.5–16.0)
IMMATURE GRANULOCYTE #: <0.1 x10ˆ3/uL (ref <0.10)
IMMATURE GRANULOCYTE %: 0.5 % (ref 0.0–1.0)
LYMPHOCYTE #: 2.55 x10ˆ3/uL (ref 1.00–4.80)
LYMPHOCYTE %: 16 %
MCH: 31.3 pg (ref 26.0–32.0)
MCHC: 34.1 g/dL (ref 31.0–35.5)
MCV: 91.7 fL (ref 78.0–100.0)
MONOCYTE #: 1 x10ˆ3/uL (ref 0.20–1.10)
MONOCYTE %: 6.3 %
MPV: 10.5 fL (ref 8.7–12.5)
NEUTROPHIL #: 11.87 x10ˆ3/uL — ABNORMAL HIGH (ref 1.50–7.70)
NEUTROPHIL %: 74.5 %
PLATELETS: 316 x10ˆ3/uL (ref 150–400)
RBC: 4.96 x10ˆ6/uL (ref 3.85–5.22)
RDW-CV: 13.9 % (ref 11.5–15.5)
WBC: 15.9 x10ˆ3/uL — ABNORMAL HIGH (ref 3.7–11.0)

## 2023-12-02 LAB — BASIC METABOLIC PANEL
ANION GAP: 10 mmol/L (ref 4–13)
BUN/CREA RATIO: 10 (ref 6–22)
BUN: 7 mg/dL — ABNORMAL LOW (ref 8–25)
CALCIUM: 9 mg/dL (ref 8.6–10.2)
CHLORIDE: 99 mmol/L (ref 96–111)
CO2 TOTAL: 24 mmol/L (ref 22–30)
CREATININE: 0.68 mg/dL (ref 0.60–1.05)
GLUCOSE: 316 mg/dL — ABNORMAL HIGH (ref 65–125)
POTASSIUM: 4.2 mmol/L (ref 3.5–5.1)
SODIUM: 133 mmol/L — ABNORMAL LOW (ref 136–145)
eGFRcr - FEMALE: >90 mL/min/1.73mˆ2 (ref >=60)

## 2023-12-02 LAB — C-REACTIVE PROTEIN(CRP),INFLAMMATION: CRP INFLAMMATION: 169 mg/L — ABNORMAL HIGH (ref <8.0)

## 2023-12-02 NOTE — ED Nurses Note (Signed)
 Dr. Rebecca Eaton in exam room speaking with patient at this time.

## 2023-12-02 NOTE — ED Nurses Note (Signed)
 The risk and benefits have been discussed with the patient in regards to placing a peripheral intravenous catheter (PIV) and/or drawing labs in the triage area. The patient was advised that they may have to wait in the ED waiting room after the PIV is placed. The patient was advised not to tamper with the PIV or infuse anything through the PIV. The patient was advised if there is any concern or problem with the PIV to contact a nurse or a staff representative to contact a nurse for the patient. The patient was advised if they choose to leave before being seen by a provider or without treatment; the patient needs to notify the a nurse or a staff representative to contact a nurse, so the PIV can be removed. The patient is aware not to leave the ED waiting room area while the PIV is in place. The patient verbalizes understanding and agrees with the plan of action.

## 2023-12-02 NOTE — ED Provider Notes (Signed)
 [facility]  Department of Emergency Medicine      12/02/2023  Foot Pain (Pt reports wound diabetic ulcer to right foot. Pt reports is getting much worse. )      History of Present Illness:    Yvette Schmidt is a 48 y.o. female with PMH T2Dm, COPD, HTN, MRSA presenting with c/f foot pain.  States that she has had a wound and progressive swelling/pain of her foot since the beginning of October.  She has been to the emergency department twice and has been on antibiotics as well; she is currently on Cipro and Flagyl, which was started on 11/5; she declined ED eval at that time against outside facility recommendations. Despite this, pain and swelling has worsened.  Swelling has progressed up her right leg. She did have some chills and nausea yesterday. She admits to uncontrolled diabetes, glucose running 200-300s. Most recent A1C 10.5 (08/2023). She does smoke. Denies alcohol/drug use. No anticoagulation. Says she cannot be admitted because she needs to take care of her sick husband at home.     History Limitations: none      Past Medical History:   Diagnosis Date    Asthma     COPD (chronic obstructive pulmonary disease)     Diabetes mellitus, type 2     Gastroparesis     GERD (gastroesophageal reflux disease)     Hypertension     MRSA (methicillin resistant staph aureus) culture positive 10/10/2022    Right buttock wound         Past Surgical History:   Procedure Laterality Date    Hx hysterectomy  2012    Hx lap cholecystectomy  05/12/2017    Pb colonoscopy,diagnostic           Social History[1]  Social History     Substance and Sexual Activity   Drug Use Not Currently    Types: Marijuana    Comment: occasionally         No family history on file.    Reviewed     Review of Systems:  If not specifically listed here all pertinent ROS are present in HPI     BP (!) 151/99   Pulse (!) 104   Temp 37.7 C (99.9 F)   Resp 20   Ht 1.651 m (5' 5)   Wt 132 kg (292 lb)   SpO2 98%   BMI 48.59 kg/m       Physical Exam  Vitals  and nursing note reviewed.   Constitutional:       General: She is not in acute distress.     Appearance: Normal appearance. She is obese.   HENT:      Mouth/Throat:      Mouth: Mucous membranes are moist.   Eyes:      Extraocular Movements: Extraocular movements intact.      Pupils: Pupils are equal, round, and reactive to light.   Cardiovascular:      Rate and Rhythm: Tachycardia present.   Pulmonary:      Effort: Pulmonary effort is normal. No respiratory distress.      Breath sounds: No wheezing.   Abdominal:      Palpations: Abdomen is soft.      Tenderness: There is no abdominal tenderness. There is no guarding.   Musculoskeletal:      Cervical back: Normal range of motion.      Right lower leg: Edema present.      Left lower leg: No edema.  Skin:     General: Skin is warm and dry.   Neurological:      Mental Status: She is alert and oriented to person, place, and time.      Motor: No weakness.                   Medical Decision Making  48 year old female with past medical history diabetes, Chronic Obstructive Pulmonary Disease, tobacco use presenting with concerns for pain and swelling in her right leg.  Wounds ongoing since around the beginning of October.  Progressively worsening.    On exam, she is alert, tachycardic but otherwise stable on room air.  Temperature is 99.9 F. right lower extremity is edematous, erythematous, warm to touch with wounds as pictured.     Suspect diabetic foot ulcer.  Foot is warm to touch; less concern for acute limb ischemia.  Consider deep vein thrombosis although infectious source is more likely.  Possible osteomyelitis, abscess.  Less consistent with necrotizing infection    Labs, imaging in process.  Patient has already expressed that she is unable to be admitted    Amount and/or Complexity of Data Reviewed  External Data Reviewed: labs and notes.  Labs: ordered.  Radiology: ordered. Decision-making details documented in ED Course.    Risk  OTC drugs.  Prescription drug  management.  Parenteral controlled substances.  Decision regarding hospitalization.  Diagnosis or treatment significantly limited by social determinants of health.      ED Course as of 12/03/23 0430   [place] Dec 03, 2023   0150 CT ANGIO LOWER EXTREMITY RIGHT W IV CONTRAST  IMPRESSION:  1. Findings consistent with osteomyelitis involving the fifth metatarsal head. Surrounding fluid collection concerning for possible abscess. Overlying soft tissue ulceration with soft tissue air.  2. Diffuse edema in the soft tissues below the knee, to lesser extent involving the right thigh.  3. Other chronic findings as above.       Discussed results of workup with patient and explicitly shared my concerns that she needs surgery and may lose her foot due to severity of infection. She is tearful; expresses concern about her husband and states she needs to go home to help care for him. She is on Cipro/flagyl and is able to continue following with wound care; is agreeable to see vascular surgery in office. She does have capacity and seems to grasp the severity of the situation, but is extremely concerned about her husband and his health. She is agreeable to receive a dose of Dalvance here in ED; I recommended continuing Cipro and flagyl. Again, I expressed concern that she needs surgery and is at risk for losing her foot/leg. Patient is leaving against medical advice. I placed a referral for Vascular Surgery to follow-up in the outpatient setting.     Medications Ordered/Administered in the ED   iopamidol (ISOVUE-370) 76% solution (125 mL Intravenous Given 12/03/23 0049)   NS bolus infusion 125 mL (125 mL Intravenous New Bag/New Syringe 12/03/23 0100)   acetaminophen (OFIRMEV) 1,000 mg (10 mg/mL) IV 100 mL (tot vol) (0 mg Intravenous Stopped 12/03/23 0142)   fentaNYL  (SUBLIMAZE ) 50 mcg/mL injection (50 mcg Intravenous Given 12/03/23 0100)   NS bolus infusion 500 mL (0 mL Intravenous Stopped 12/03/23 0142)   dalbavancin (DALVANCE) 1,500 mg in  D5W 250 mL IVPB (0 mg Intravenous Stopped 12/03/23 0303)        Clinical Impression   Osteomyelitis of fifth toe of right foot (CMS HCC) (Primary)  Cellulitis of right lower extremity   Uncontrolled type 2 diabetes mellitus with hyperglycemia (CMS HCC)       Disposition:  AMA      Roselee Tayloe, DO         [1]   Social History  Tobacco Use    Smoking status: Every Day     Current packs/day: 0.50     Average packs/day: 0.5 packs/day for 15.0 years (7.5 ttl pk-yrs)     Types: Cigarettes    Smokeless tobacco: Never   Substance Use Topics    Alcohol use: Not Currently     Comment: seldom    Drug use: Not Currently     Types: Marijuana     Comment: occasionally

## 2023-12-03 LAB — LACTIC ACID LEVEL W/ REFLEX FOR LEVEL >2.0: LACTIC ACID: 1.2 mmol/L (ref 0.5–2.2)

## 2023-12-03 LAB — PT/INR: INR: 1.1 (ref 0.80–1.10)

## 2023-12-03 LAB — PROCALCITONIN: PROCALCITONIN: 0.08 ng/mL (ref <0.50)

## 2023-12-03 MED ORDER — FENTANYL (PF) 50 MCG/ML INJECTION WRAPPER
50.0000 ug | INJECTION | INTRAMUSCULAR | Status: AC
Start: 2023-12-03 — End: 2023-12-03
  Administered 2023-12-03: 50 ug via INTRAVENOUS
  Filled 2023-12-03: qty 2

## 2023-12-03 MED ORDER — DEXTROSE 5 % IN WATER (D5W) INTRAVENOUS SOLUTION
1500.0000 mg | Freq: Once | INTRAVENOUS | Status: AC
Start: 2023-12-03 — End: 2023-12-03
  Administered 2023-12-03: 0 mg via INTRAVENOUS
  Administered 2023-12-03: 1500 mg via INTRAVENOUS
  Filled 2023-12-03: qty 75

## 2023-12-03 MED ORDER — SODIUM CHLORIDE 0.9 % IV BOLUS
125.0000 mL | INJECTION | Status: AC
Start: 2023-12-03 — End: 2023-12-03
  Administered 2023-12-03: 125 mL via INTRAVENOUS

## 2023-12-03 MED ORDER — ACETAMINOPHEN 1,000 MG/100 ML (10 MG/ML) INTRAVENOUS SOLUTION
1000.0000 mg | INTRAVENOUS | Status: AC
Start: 2023-12-03 — End: 2023-12-03
  Administered 2023-12-03: 1000 mg via INTRAVENOUS
  Administered 2023-12-03: 0 mg via INTRAVENOUS
  Filled 2023-12-03: qty 100

## 2023-12-03 MED ORDER — SODIUM CHLORIDE 0.9 % IV BOLUS
500.0000 mL | INJECTION | Status: AC
Start: 2023-12-03 — End: 2023-12-03
  Administered 2023-12-03: 500 mL via INTRAVENOUS
  Administered 2023-12-03: 0 mL via INTRAVENOUS

## 2023-12-03 MED ORDER — IOPAMIDOL 370 MG IODINE/ML (76 %) INTRAVENOUS SOLUTION
125.0000 mL | INTRAVENOUS | Status: AC
Start: 2023-12-03 — End: 2023-12-03
  Administered 2023-12-03: 125 mL via INTRAVENOUS

## 2023-12-03 NOTE — ED Nurses Note (Signed)
 Dr. Glynn in exam room discussing test results and treatment recommendation with patient at this time.

## 2023-12-03 NOTE — ED Nurses Note (Signed)
 Patient requesting pain medication and something to drink.  MD notified.

## 2023-12-03 NOTE — ED Nurses Note (Signed)
 Ice chips provided per patient request, MD approved.

## 2023-12-05 ENCOUNTER — Encounter (HOSPITAL_COMMUNITY)
Admission: RE | Disposition: A | Discharge status: Home / Self Care | Payer: Self-pay | Source: Ambulatory Visit | Attending: Vascular & Interventional Radiology

## 2023-12-05 ENCOUNTER — Inpatient Hospital Stay
Admission: RE | Admit: 2023-12-05 | Discharge: 2023-12-11 | DRG: 239 | Disposition: A | Discharge status: Home / Self Care | Payer: MEDICAID | Source: Ambulatory Visit | Attending: Vascular & Interventional Radiology | Admitting: Vascular & Interventional Radiology

## 2023-12-05 ENCOUNTER — Encounter (INDEPENDENT_AMBULATORY_CARE_PROVIDER_SITE_OTHER): Payer: Self-pay | Admitting: Vascular & Interventional Radiology

## 2023-12-05 ENCOUNTER — Ambulatory Visit (HOSPITAL_COMMUNITY): Payer: MEDICAID | Admitting: Anesthesiology

## 2023-12-05 ENCOUNTER — Encounter (HOSPITAL_COMMUNITY): Payer: Self-pay | Admitting: Vascular & Interventional Radiology

## 2023-12-05 ENCOUNTER — Ambulatory Visit (HOSPITAL_BASED_OUTPATIENT_CLINIC_OR_DEPARTMENT_OTHER): Payer: MEDICAID | Admitting: FAMILY PRACTICE

## 2023-12-05 ENCOUNTER — Ambulatory Visit (INDEPENDENT_AMBULATORY_CARE_PROVIDER_SITE_OTHER): Payer: Self-pay | Admitting: Primary Care

## 2023-12-05 ENCOUNTER — Other Ambulatory Visit: Payer: Self-pay

## 2023-12-05 VITALS — BP 146/90 | HR 91 | Temp 98.6°F | Ht 65.0 in | Wt 292.7 lb

## 2023-12-05 DIAGNOSIS — A480 Gas gangrene: Principal | ICD-10-CM | POA: Diagnosis present

## 2023-12-05 DIAGNOSIS — L02611 Cutaneous abscess of right foot: Secondary | ICD-10-CM | POA: Diagnosis present

## 2023-12-05 DIAGNOSIS — Z7982 Long term (current) use of aspirin: Secondary | ICD-10-CM

## 2023-12-05 DIAGNOSIS — F411 Generalized anxiety disorder: Secondary | ICD-10-CM | POA: Diagnosis present

## 2023-12-05 DIAGNOSIS — Z794 Long term (current) use of insulin: Secondary | ICD-10-CM

## 2023-12-05 DIAGNOSIS — Z7984 Long term (current) use of oral hypoglycemic drugs: Secondary | ICD-10-CM

## 2023-12-05 DIAGNOSIS — K219 Gastro-esophageal reflux disease without esophagitis: Secondary | ICD-10-CM | POA: Diagnosis present

## 2023-12-05 DIAGNOSIS — M86171 Other acute osteomyelitis, right ankle and foot: Principal | ICD-10-CM | POA: Diagnosis present

## 2023-12-05 DIAGNOSIS — E1152 Type 2 diabetes mellitus with diabetic peripheral angiopathy with gangrene: Principal | ICD-10-CM | POA: Diagnosis present

## 2023-12-05 DIAGNOSIS — Z6841 Body Mass Index (BMI) 40.0 and over, adult: Secondary | ICD-10-CM

## 2023-12-05 DIAGNOSIS — I96 Gangrene, not elsewhere classified: Secondary | ICD-10-CM

## 2023-12-05 DIAGNOSIS — E1165 Type 2 diabetes mellitus with hyperglycemia: Secondary | ICD-10-CM | POA: Diagnosis present

## 2023-12-05 DIAGNOSIS — L03115 Cellulitis of right lower limb: Secondary | ICD-10-CM | POA: Diagnosis present

## 2023-12-05 DIAGNOSIS — E1143 Type 2 diabetes mellitus with diabetic autonomic (poly)neuropathy: Secondary | ICD-10-CM | POA: Diagnosis present

## 2023-12-05 DIAGNOSIS — K429 Umbilical hernia without obstruction or gangrene: Secondary | ICD-10-CM | POA: Diagnosis present

## 2023-12-05 DIAGNOSIS — E1142 Type 2 diabetes mellitus with diabetic polyneuropathy: Secondary | ICD-10-CM | POA: Diagnosis present

## 2023-12-05 DIAGNOSIS — J4489 Other specified chronic obstructive pulmonary disease: Secondary | ICD-10-CM | POA: Diagnosis present

## 2023-12-05 DIAGNOSIS — I5032 Chronic diastolic (congestive) heart failure: Secondary | ICD-10-CM | POA: Diagnosis present

## 2023-12-05 DIAGNOSIS — I110 Hypertensive heart disease with heart failure: Secondary | ICD-10-CM | POA: Diagnosis present

## 2023-12-05 DIAGNOSIS — Z79899 Other long term (current) drug therapy: Secondary | ICD-10-CM

## 2023-12-05 DIAGNOSIS — L089 Local infection of the skin and subcutaneous tissue, unspecified: Secondary | ICD-10-CM

## 2023-12-05 DIAGNOSIS — M869 Osteomyelitis, unspecified: Secondary | ICD-10-CM

## 2023-12-05 DIAGNOSIS — M79669 Pain in unspecified lower leg: Secondary | ICD-10-CM

## 2023-12-05 DIAGNOSIS — F17210 Nicotine dependence, cigarettes, uncomplicated: Secondary | ICD-10-CM | POA: Diagnosis present

## 2023-12-05 DIAGNOSIS — E1169 Type 2 diabetes mellitus with other specified complication: Secondary | ICD-10-CM | POA: Diagnosis present

## 2023-12-05 DIAGNOSIS — Z7951 Long term (current) use of inhaled steroids: Secondary | ICD-10-CM

## 2023-12-05 DIAGNOSIS — Z8614 Personal history of Methicillin resistant Staphylococcus aureus infection: Secondary | ICD-10-CM

## 2023-12-05 DIAGNOSIS — K3184 Gastroparesis: Secondary | ICD-10-CM | POA: Diagnosis present

## 2023-12-05 LAB — CBC
HCT: 46.4 % — ABNORMAL HIGH (ref 34.8–46.0)
HGB: 15.7 g/dL (ref 11.5–16.0)
MCH: 31 pg (ref 26.0–32.0)
MCHC: 33.8 g/dL (ref 31.0–35.5)
MCV: 91.5 fL (ref 78.0–100.0)
MPV: 9.8 fL (ref 8.7–12.5)
PLATELETS: 311 x10ˆ3/uL (ref 150–400)
RBC: 5.07 x10ˆ6/uL (ref 3.85–5.22)
RDW-CV: 14.2 % (ref 11.5–15.5)
WBC: 13.8 x10ˆ3/uL — ABNORMAL HIGH (ref 3.7–11.0)

## 2023-12-05 LAB — POC BLOOD GLUCOSE (RESULTS)
GLUCOSE, POC: 348 mg/dL — ABNORMAL HIGH (ref 70–110)
GLUCOSE, POC: 237 mg/dL — ABNORMAL HIGH (ref 70–110)

## 2023-12-05 SURGERY — REVISION AMPUTATION
Anesthesia: General | Site: Foot | Laterality: Right | Wound class: Contaminated Wounds-Open, fresh, accidental wounds

## 2023-12-05 MED ORDER — ALBUTEROL SULFATE 2.5 MG/3 ML (0.083 %) SOLUTION FOR NEBULIZATION
2.5000 mg | INHALATION_SOLUTION | RESPIRATORY_TRACT | Status: DC | PRN
Start: 2023-12-05 — End: 2023-12-11
  Administered 2023-12-11: 2.5 mg via RESPIRATORY_TRACT

## 2023-12-05 MED ORDER — SODIUM CHLORIDE 0.9% FLUSH BAG - 250 ML
INTRAVENOUS | Status: DC | PRN
Start: 2023-12-05 — End: 2023-12-06

## 2023-12-05 MED ORDER — DEXTROSE 5% IN WATER (D5W) FLUSH BAG - 250 ML
INTRAVENOUS | Status: DC | PRN
Start: 2023-12-05 — End: 2023-12-06

## 2023-12-05 MED ORDER — AZELASTINE 137 MCG (0.1 %) NASAL SPRAY
2.0000 | Freq: Two times a day (BID) | NASAL | Status: DC
Start: 2023-12-06 — End: 2023-12-11
  Administered 2023-12-06 – 2023-12-11 (×11): 2 via NASAL
  Filled 2023-12-05: qty 30

## 2023-12-05 MED ORDER — SODIUM CHLORIDE 0.9 % (FLUSH) INJECTION SYRINGE
3.0000 mL | INJECTION | Freq: Three times a day (TID) | INTRAMUSCULAR | Status: DC
Start: 2023-12-06 — End: 2023-12-06

## 2023-12-05 MED ORDER — CEFAZOLIN 2 GRAM INJECTION - 100MG/ML FOR IV PUSH
2.0000 g | Freq: Three times a day (TID) | INTRAMUSCULAR | Status: DC
Start: 2023-12-06 — End: 2023-12-06

## 2023-12-05 MED ORDER — ASPIRIN 81 MG CHEWABLE TABLET
81.0000 mg | CHEWABLE_TABLET | Freq: Every day | ORAL | Status: DC
Start: 2023-12-06 — End: 2023-12-11
  Administered 2023-12-06 – 2023-12-11 (×6): 81 mg via ORAL
  Filled 2023-12-05 (×6): qty 1

## 2023-12-05 MED ORDER — HEPARIN (PORCINE) 1,000 UNIT/ML INJECTION SOLUTION
INTRAMUSCULAR | Status: AC
Start: 2023-12-05 — End: 2023-12-05
  Filled 2023-12-05: qty 10

## 2023-12-05 MED ORDER — NICOTINE 21 MG/24 HR DAILY TRANSDERMAL PATCH
21.0000 mg | MEDICATED_PATCH | Freq: Every day | TRANSDERMAL | Status: DC
Start: 2023-12-05 — End: 2023-12-11
  Administered 2023-12-05: 21 mg via TRANSDERMAL
  Administered 2023-12-06: 0 mg via TRANSDERMAL
  Administered 2023-12-06 – 2023-12-11 (×6): 21 mg via TRANSDERMAL
  Filled 2023-12-05 (×7): qty 1

## 2023-12-05 MED ORDER — GENTAMICIN 40 MG/ML INJECTION SOLUTION
INTRAMUSCULAR | Status: AC
Start: 2023-12-05 — End: 2023-12-05
  Filled 2023-12-05: qty 6

## 2023-12-05 MED ORDER — SODIUM CHLORIDE 0.9 % (FLUSH) INJECTION SYRINGE
3.0000 mL | INJECTION | INTRAMUSCULAR | Status: DC | PRN
Start: 2023-12-05 — End: 2023-12-06

## 2023-12-05 MED ORDER — MUPIROCIN 2 % TOPICAL OINTMENT
TOPICAL_OINTMENT | Freq: Three times a day (TID) | CUTANEOUS | Status: DC
Start: 2023-12-06 — End: 2023-12-11
  Administered 2023-12-06 – 2023-12-11 (×8): 0 g via TOPICAL
  Filled 2023-12-05 (×2): qty 22

## 2023-12-05 MED ORDER — PROPOFOL 10 MG/ML IV BOLUS
INJECTION | Freq: Once | INTRAVENOUS | Status: DC | PRN
Start: 2023-12-05 — End: 2023-12-05
  Administered 2023-12-05: 150 mg via INTRAVENOUS

## 2023-12-05 MED ORDER — MIDAZOLAM 1 MG/ML INJECTION WRAPPER
Freq: Once | INTRAMUSCULAR | Status: DC | PRN
Start: 2023-12-05 — End: 2023-12-05
  Administered 2023-12-05: 2 mg via INTRAVENOUS

## 2023-12-05 MED ORDER — CEFAZOLIN 2 GRAM INJECTION - 100MG/ML FOR IV PUSH
Freq: Once | INTRAMUSCULAR | Status: DC | PRN
Start: 2023-12-05 — End: 2023-12-05
  Administered 2023-12-05: 2000 mg via INTRAVENOUS

## 2023-12-05 MED ORDER — PANTOPRAZOLE 40 MG TABLET,DELAYED RELEASE
40.0000 mg | DELAYED_RELEASE_TABLET | Freq: Two times a day (BID) | ORAL | Status: DC
Start: 2023-12-06 — End: 2023-12-11
  Administered 2023-12-06 – 2023-12-11 (×12): 40 mg via ORAL
  Filled 2023-12-05 (×12): qty 1

## 2023-12-05 MED ORDER — ALBUTEROL SULFATE 2.5 MG/3 ML (0.083 %) SOLUTION FOR NEBULIZATION
2.5000 mg | INHALATION_SOLUTION | Freq: Four times a day (QID) | RESPIRATORY_TRACT | Status: DC | PRN
Start: 2023-12-05 — End: 2023-12-05

## 2023-12-05 MED ORDER — MIDAZOLAM 1 MG/ML INJECTION WRAPPER
2.0000 mg | Freq: Once | INTRAMUSCULAR | Status: AC
Start: 2023-12-05 — End: 2023-12-05
  Administered 2023-12-05: 2 mg via INTRAVENOUS
  Filled 2023-12-05: qty 2

## 2023-12-05 MED ORDER — FENTANYL (PF) 50 MCG/ML INJECTION SOLUTION
INTRAMUSCULAR | Status: AC
Start: 2023-12-05 — End: 2023-12-05
  Filled 2023-12-05: qty 2

## 2023-12-05 MED ORDER — ARFORMOTEROL 15 MCG/2 ML SOLUTION FOR NEBULIZATION
15.0000 ug | INHALATION_SOLUTION | Freq: Two times a day (BID) | RESPIRATORY_TRACT | Status: DC
Start: 2023-12-06 — End: 2023-12-11
  Administered 2023-12-06 – 2023-12-11 (×12): 15 ug via RESPIRATORY_TRACT
  Filled 2023-12-05: qty 1

## 2023-12-05 MED ORDER — SODIUM CHLORIDE 0.9 % (FLUSH) INJECTION SYRINGE
3.0000 mL | INJECTION | Freq: Three times a day (TID) | INTRAMUSCULAR | Status: DC
Start: 2023-12-05 — End: 2023-12-06
  Administered 2023-12-05 (×2): 0 mL

## 2023-12-05 MED ORDER — FENTANYL (PF) 50 MCG/ML INJECTION WRAPPER
25.0000 ug | INJECTION | INTRAMUSCULAR | Status: DC | PRN
Start: 2023-12-05 — End: 2023-12-06
  Administered 2023-12-05 (×2): 25 ug via INTRAVENOUS
  Filled 2023-12-05: qty 2

## 2023-12-05 MED ORDER — FENTANYL (PF) 50 MCG/ML INJECTION WRAPPER
INJECTION | Freq: Once | INTRAMUSCULAR | Status: DC | PRN
Start: 2023-12-05 — End: 2023-12-05
  Administered 2023-12-05: 100 ug via INTRAVENOUS

## 2023-12-05 MED ORDER — FLUTICASONE PROPIONATE 50 MCG/ACTUATION NASAL SPRAY,SUSPENSION
2.0000 | Freq: Two times a day (BID) | NASAL | Status: DC
Start: 2023-12-06 — End: 2023-12-11
  Administered 2023-12-06 – 2023-12-11 (×12): 2 via NASAL
  Filled 2023-12-05: qty 16

## 2023-12-05 MED ORDER — LIDOCAINE HCL 20 MG/ML (2 %) INJECTION SOLUTION
INTRAMUSCULAR | Status: AC
Start: 2023-12-05 — End: 2023-12-05
  Filled 2023-12-05: qty 20

## 2023-12-05 MED ORDER — IPRATROPIUM 0.5 MG-ALBUTEROL 3 MG (2.5 MG BASE)/3 ML NEBULIZATION SOLN
3.0000 mL | INHALATION_SOLUTION | Freq: Once | RESPIRATORY_TRACT | Status: DC | PRN
Start: 2023-12-05 — End: 2023-12-06

## 2023-12-05 MED ORDER — GABAPENTIN 400 MG CAPSULE
800.0000 mg | ORAL_CAPSULE | Freq: Two times a day (BID) | ORAL | Status: DC
Start: 2023-12-06 — End: 2023-12-06
  Administered 2023-12-06: 0 mg via ORAL
  Administered 2023-12-06: 800 mg via ORAL
  Filled 2023-12-05: qty 2

## 2023-12-05 MED ORDER — ENOXAPARIN 40 MG/0.4 ML SUBCUTANEOUS SYRINGE
40.0000 mg | INJECTION | SUBCUTANEOUS | Status: DC
Start: 2023-12-06 — End: 2023-12-11
  Administered 2023-12-06: 40 mg via SUBCUTANEOUS
  Administered 2023-12-07 – 2023-12-08 (×2): 0 mg via SUBCUTANEOUS
  Administered 2023-12-09: 40 mg via SUBCUTANEOUS
  Administered 2023-12-10 – 2023-12-11 (×2): 0 mg via SUBCUTANEOUS
  Filled 2023-12-05 (×5): qty 0.4

## 2023-12-05 MED ORDER — SODIUM CHLORIDE 0.9 % INTRAVENOUS SOLUTION
INTRAVENOUS | Status: DC
Start: 2023-12-06 — End: 2023-12-06
  Administered 2023-12-06: 0 mL via INTRAVENOUS

## 2023-12-05 MED ORDER — IPRATROPIUM BROMIDE 0.02 % SOLUTION FOR INHALATION
0.2500 mg | Freq: Four times a day (QID) | RESPIRATORY_TRACT | Status: DC
Start: 2023-12-06 — End: 2023-12-08
  Administered 2023-12-06 – 2023-12-08 (×9): 0.25 mg via RESPIRATORY_TRACT
  Filled 2023-12-05: qty 1

## 2023-12-05 MED ORDER — LINACLOTIDE 290 MCG CAPSULE
290.0000 ug | ORAL_CAPSULE | Freq: Every morning | ORAL | Status: DC
Start: 2023-12-06 — End: 2023-12-11
  Administered 2023-12-06: 0 ug via ORAL
  Administered 2023-12-07 – 2023-12-10 (×4): 290 ug via ORAL
  Administered 2023-12-11: 0 ug via ORAL
  Filled 2023-12-05 (×6): qty 1

## 2023-12-05 MED ORDER — MELOXICAM 15 MG TABLET
15.0000 mg | ORAL_TABLET | Freq: Every evening | ORAL | Status: DC
Start: 2023-12-06 — End: 2023-12-11
  Administered 2023-12-06 – 2023-12-10 (×6): 15 mg via ORAL
  Filled 2023-12-05 (×6): qty 1

## 2023-12-05 MED ORDER — LIDOCAINE (PF) 100 MG/5 ML (2 %) INTRAVENOUS SYRINGE
INJECTION | Freq: Once | INTRAVENOUS | Status: DC | PRN
Start: 2023-12-05 — End: 2023-12-05
  Administered 2023-12-05: 100 mg via INTRAVENOUS

## 2023-12-05 MED ORDER — ONDANSETRON HCL (PF) 4 MG/2 ML INJECTION SOLUTION
INTRAMUSCULAR | Status: AC
Start: 2023-12-05 — End: 2023-12-05
  Filled 2023-12-05: qty 2

## 2023-12-05 MED ORDER — PROTAMINE 10 MG/ML INTRAVENOUS SOLUTION
INTRAVENOUS | Status: AC
Start: 2023-12-05 — End: 2023-12-05
  Filled 2023-12-05: qty 5

## 2023-12-05 MED ORDER — ONDANSETRON HCL (PF) 4 MG/2 ML INJECTION SOLUTION
Freq: Once | INTRAMUSCULAR | Status: DC | PRN
Start: 2023-12-05 — End: 2023-12-05
  Administered 2023-12-05: 4 mg via INTRAVENOUS

## 2023-12-05 MED ORDER — HYDRALAZINE 20 MG/ML INJECTION SOLUTION
5.0000 mg | INTRAMUSCULAR | Status: DC | PRN
Start: 2023-12-05 — End: 2023-12-06

## 2023-12-05 MED ORDER — GENTAMICIN 40 MG/ML INJECTION SOLUTION
Freq: Once | INTRAMUSCULAR | Status: DC | PRN
Start: 2023-12-05 — End: 2023-12-05
  Administered 2023-12-05: 120 mg

## 2023-12-05 MED ORDER — SACCHAROMYCES BOULARDII 250 MG CAPSULE
250.0000 mg | ORAL_CAPSULE | Freq: Once | ORAL | Status: DC
Start: 2023-12-06 — End: 2023-12-06

## 2023-12-05 MED ORDER — POLYETHYLENE GLYCOL 3350 17 GRAM ORAL POWDER PACKET
17.0000 g | Freq: Every day | ORAL | Status: DC | PRN
Start: 2023-12-05 — End: 2023-12-11

## 2023-12-05 MED ORDER — MIDAZOLAM 1 MG/ML INJECTION WRAPPER
INTRAMUSCULAR | Status: AC
Start: 2023-12-05 — End: 2023-12-05
  Filled 2023-12-05: qty 2

## 2023-12-05 MED ORDER — CETIRIZINE 10 MG TABLET
10.0000 mg | ORAL_TABLET | Freq: Every day | ORAL | Status: DC
Start: 2023-12-06 — End: 2023-12-11
  Administered 2023-12-06 – 2023-12-11 (×6): 10 mg via ORAL
  Filled 2023-12-05 (×7): qty 1

## 2023-12-05 MED ORDER — SUMATRIPTAN 25 MG TABLET
50.0000 mg | ORAL_TABLET | Freq: Once | ORAL | Status: DC | PRN
Start: 2023-12-05 — End: 2023-12-08
  Filled 2023-12-05: qty 2

## 2023-12-05 MED ORDER — BETAMETHASONE VALERATE 0.1 % TOPICAL OINTMENT
TOPICAL_OINTMENT | Freq: Two times a day (BID) | CUTANEOUS | Status: DC
Start: 2023-12-06 — End: 2023-12-06

## 2023-12-05 MED ORDER — SAXAGLIPTIN 2.5 MG TABLET
2.5000 mg | ORAL_TABLET | Freq: Every day | ORAL | Status: DC
Start: 2023-12-06 — End: 2023-12-11
  Filled 2023-12-05: qty 1

## 2023-12-05 MED ORDER — MIDAZOLAM 1 MG/ML INJECTION WRAPPER
2.0000 mg | INTRAMUSCULAR | Status: AC
Start: 2023-12-05 — End: 2023-12-05
  Administered 2023-12-05: 2 mg via INTRAVENOUS
  Filled 2023-12-05: qty 2

## 2023-12-05 MED ORDER — PROCHLORPERAZINE EDISYLATE 10 MG/2 ML (5 MG/ML) INJECTION SOLUTION
5.0000 mg | Freq: Once | INTRAMUSCULAR | Status: DC | PRN
Start: 2023-12-05 — End: 2023-12-06

## 2023-12-05 MED ORDER — BUDESONIDE 0.5 MG/2 ML SUSPENSION FOR NEBULIZATION
0.5000 mg | INHALATION_SUSPENSION | Freq: Two times a day (BID) | RESPIRATORY_TRACT | Status: DC
Start: 2023-12-06 — End: 2023-12-11
  Administered 2023-12-06 – 2023-12-11 (×12): 0.5 mg via RESPIRATORY_TRACT
  Filled 2023-12-05: qty 2

## 2023-12-05 MED ORDER — DEXAMETHASONE SODIUM PHOSPHATE 4 MG/ML INJECTION SOLUTION
INTRAMUSCULAR | Status: AC
Start: 2023-12-05 — End: 2023-12-05
  Filled 2023-12-05: qty 2

## 2023-12-05 MED ORDER — IBUPROFEN 800 MG TABLET
800.0000 mg | ORAL_TABLET | Freq: Four times a day (QID) | ORAL | Status: DC | PRN
Start: 2023-12-05 — End: 2023-12-11
  Filled 2023-12-05: qty 1

## 2023-12-05 MED ORDER — FENTANYL (PF) 50 MCG/ML INJECTION WRAPPER
50.0000 ug | INJECTION | Freq: Once | INTRAMUSCULAR | Status: AC
Start: 2023-12-05 — End: 2023-12-05
  Administered 2023-12-05: 50 ug via INTRAVENOUS
  Filled 2023-12-05: qty 2

## 2023-12-05 MED ORDER — AMITRIPTYLINE 50 MG TABLET
50.0000 mg | ORAL_TABLET | Freq: Every evening | ORAL | Status: DC
Start: 2023-12-06 — End: 2023-12-11
  Administered 2023-12-06 (×2): 0 mg via ORAL
  Administered 2023-12-07 – 2023-12-10 (×4): 50 mg via ORAL
  Filled 2023-12-05 (×10): qty 1

## 2023-12-05 MED ORDER — LACTATED RINGERS INTRAVENOUS SOLUTION
INTRAVENOUS | Status: DC
Start: 2023-12-05 — End: 2023-12-06
  Administered 2023-12-06: 0 mL via INTRAVENOUS

## 2023-12-05 MED ORDER — ROSUVASTATIN 20 MG TABLET
20.0000 mg | ORAL_TABLET | Freq: Every evening | ORAL | Status: DC
Start: 2023-12-06 — End: 2023-12-11
  Administered 2023-12-06 – 2023-12-10 (×6): 20 mg via ORAL
  Filled 2023-12-05 (×6): qty 1

## 2023-12-05 MED ORDER — CLONAZEPAM 0.5 MG TABLET
0.5000 mg | ORAL_TABLET | Freq: Two times a day (BID) | ORAL | Status: DC
Start: 2023-12-06 — End: 2023-12-07
  Administered 2023-12-06 (×3): 0.5 mg via ORAL
  Filled 2023-12-05 (×3): qty 1

## 2023-12-05 MED ORDER — FUROSEMIDE 20 MG TABLET
40.0000 mg | ORAL_TABLET | Freq: Every day | ORAL | Status: DC
Start: 2023-12-06 — End: 2023-12-11

## 2023-12-05 MED ORDER — VITAMIN B COMPLEX-VITAMIN C-FOLIC ACID 0.8 MG TABLET
1.0000 | ORAL_TABLET | Freq: Every day | ORAL | Status: DC
Start: 2023-12-06 — End: 2023-12-11
  Administered 2023-12-06: 1 via ORAL
  Administered 2023-12-07: 0 via ORAL
  Administered 2023-12-08 – 2023-12-11 (×4): 1 via ORAL
  Filled 2023-12-05 (×7): qty 1

## 2023-12-05 SURGICAL SUPPLY — 39 items
BANDAGE 11YDX6IN NONST ELAS SS SELF CLSR COTTON COMPRESS TAN LTX (WOUND CARE SUPPLY) IMPLANT
BANDAGE 4.1YDX4.5IN 6 PLY HYPOALL COTTON LRG GAUZE WHT STRL LF  DISP (WOUND CARE SUPPLY) ×1 IMPLANT
BLADE 15 BD RB-BCK CBNSTL SURG TISS STRL LF  DISP (SURGICAL CUTTING SUPPLIES) ×1 IMPLANT
BLADE SAW 70X12.6MM RECIPROCATE 2 SD SS THK.91MM THK.64MM STRL LF  DISP (SURGICAL CUTTING SUPPLIES) ×1 IMPLANT
CONTAINR CLICKSEAL 4OZ TRANSLUC SCREW CAP STRL BLU SPECI PNEUM TUBE SYS (SPECIMEN COLLECTION SUPPLIES) ×1 IMPLANT
COVER RIGID STRL LF  CNVRT LIGHT HNDL PLASTIC DISP GRN (MED SURG SUPPLIES) ×1 IMPLANT
DRAPE U LF  STRL DISP SURG (DRAPE/PACKS/SHEETS/OR TOWEL) ×2 IMPLANT
ELECTRODE ESURG BLADE 2.75IN 3/32IN EDGE STRL .2IN DISP INSL STD SHAFT HEX LOCK LF (SURGICAL CUTTING SUPPLIES) ×1 IMPLANT
ELECTRODE PATIENT RTN 9FT VLAB C30- LB RM PHSV ACRL FOAM CORD NONIRRITATE NONSENSITIZE ADH STRP (SURGICAL CUTTING SUPPLIES) ×1 IMPLANT
GOWN SURG XL STD LGTH L4 HKLP CLSR RGLN SLEEVE TWL STRL LF  DISP WHT AERO CR PRFRM FBRC (DRAPE/PACKS/SHEETS/OR TOWEL) ×1 IMPLANT
IMB ORTHO STD 24IN 25- IN KNEE 1 PNL FINGER PCKT MDL LAT STAY FOAM (ORTHOPEDICS (NOT IMPLANTS)) ×1 IMPLANT
KIT RM TURNOVER STPC DISP (DRAPE/PACKS/SHEETS/OR TOWEL) ×1 IMPLANT
LABEL STRL CV_C3333UHCV 100ST/BX (MED SURG SUPPLIES) IMPLANT
LINER SUCT MEDIVAC FLX ADV TW 1 PC FILTER SHTOF VALVE 1500CC NONST LF  DISP (MED SURG SUPPLIES) IMPLANT
PACKING WOUND 5YDX.5IN COTTON CURAD NU G STRP SLVG EDGE WOVEN PLAIN STRL LF  DISP (WOUND CARE SUPPLY) IMPLANT
PAD DRESS 4X3IN MDCHC NONADH NWVN LF  STRL DISP WHT (WOUND CARE SUPPLY) ×1 IMPLANT
PEN SURG MRKNG DVN SKIN DISP NONSMEAR REG TIP FLXB RLR LBL GNTN VIOL STRL LF (MED SURG SUPPLIES) ×1 IMPLANT
SEALANT TISS CLSR VISTASEAL FBRN HUMAN 4ML (WOUND CARE SUPPLY) ×1 IMPLANT
SMOKE PENCIL WITH EDGE ELECTRODE 15 FT (MED SURG SUPPLIES) ×1 IMPLANT
SOL SURG PREP 26ML DRPRP 74% ISPRP 0.7% IOD POVACRYLEX SLF CNTN APPL SKIN STRL PREOP (MED SURG SUPPLIES) ×2 IMPLANT
SPONGE GAUZE 4X4IN MDCHC COTTON 16 PLY USP TY VII LF  STRL DISP (WOUND CARE SUPPLY) IMPLANT
SPONGE LAP 18X18IN STD 4 PLY XRY RF DTBL ABS RFDETECT COTTON STRL LF  DISP (MED SURG SUPPLIES) ×2 IMPLANT
SPONGE SUPER KERLIX STRL_322059 DR ADENIYI 25PK/CS (MED SURG SUPPLIES) ×1 IMPLANT
STAPLER SKIN 3.9X6.9MM W RECT 35 CNT ROT HEAD RATCHET STRL LF  PRX DISP SS .58MM (ENDOSCOPIC SUPPLIES) ×2 IMPLANT
STKNT ORTHO 44X9IN PLSTR COTTON OTHRT IMPRV DRP MED STRL (ORTHOPEDICS (NOT IMPLANTS)) ×1 IMPLANT
STRAP POSITION 19X3.5IN LEG TECLIN STRUP LITH NONST LF  DISP (MED SURG SUPPLIES) ×1 IMPLANT
STRIP 5YDX.25IN COTTON GAUZE WOUND CURAD WOVEN STRL LF (WOUND CARE SUPPLY) IMPLANT
STRIP 5YDX.5IN IFRM COTTON GAUZE WOUND CURAD WOVEN STRL LF (WOUND CARE SUPPLY) IMPLANT
STRIP 5YDX1IN COTTON IFRM WOUND CURAD NU G SLVG EDGE WOVEN RAVEL RST STRL LF  DISP (WOUND CARE SUPPLY) IMPLANT
STRIP 5YDX2IN IFRM COTTON GAUZE WOUND CURAD WOVEN STRL LF (WOUND CARE SUPPLY) IMPLANT
SUTURE 0 CT1 PROLENE 30IN BLU MONOF NONAB (SUTURE/WOUND CLOSURE) ×3 IMPLANT
SUTURE 2-0 CT1 VICRYL 36IN VIOL BRD COAT ABS (SUTURE/WOUND CLOSURE) ×3 IMPLANT
SUTURE SILK 2-0 PERMAHAND 18IN BLK BRD TIE 12 STRN PCUT NONAB (SUTURE/WOUND CLOSURE) ×1 IMPLANT
SUTURE SILK 3-0 PERMAHAND 18IN BLK BRD TIE 12 STRN PCUT NONAB (SUTURE/WOUND CLOSURE) ×1 IMPLANT
SYRINGE 60ML LF  STRL LID SFT BULB TIP IRRG TVK (MED SURG SUPPLIES) ×1 IMPLANT
TRAY CATH 16FR SNAPSECURE 1 LYR FOLEY DRAIN BAG SIL 10ML (UROLOGICAL SUPPLIES) ×1 IMPLANT
TRAY CUSTOM MAJOR - ~~LOC~~ (CUSTOM TRAYS & PACK) ×1 IMPLANT
TRAY UNIVERSAL CARDIOVASCULAR ~~LOC~~ - UNITED HOSPITAL CTR. (CUSTOM TRAYS & PACK) ×1 IMPLANT
WAX BONE 2.5G STRL NATURAL (WOUND CARE SUPPLY) IMPLANT

## 2023-12-05 NOTE — OR Surgeon (Signed)
 OPERATIVE NOTE    Patient Name: Yvette Schmidt  Age:  48 y.o.  Sex:  female  MRN:  [id number]  CSN:  [id number]    Date of Service: 12/05/2023    Date of Birth: 04-Apr-1975      Pre-Operative Diagnosis:  Right foot gas gangrene    Post-Operative Diagnosis:  Same    Procedure:  Open right foot 5th ray amputation  Drainage of right foot complex abscess  Excisional debridement skin subcutaneous tissue fat fascia muscle tendon  Washout and open packing of wound    Indication:  As noted above                Narrative of Procedure:  Indications contraindications risks benefits and alternative therapies to the procedure were discussed with the patient including the option for a 2nd opinion if deemed appropriate.  Appropriate consents were obtained.  Patient was brought to the operating room. Patient was positioned supine on the operating table Appropriate lines and tubes were placed.  General anesthesia was initiated by the anesthesiologist.  Right lower extremity was prepped and draped in sterile fashion standard for the procedure.      An incision was made with a scalpel statin from the 4th webspace circumferentially around the demarcation of the necrotic and viable skin on the dorsum and plantar surfaces of the right foot.  Webspace and the 5th metatarsal down the tarsal joint.  The metatarsal was therefore disarticulated from the cuboid bone.  I resected extensor and flexor tendons flush with the level of the tarsal metatarsal joint.  There was a large abscess in the 4th webspace which was drained.  I then used a scalpel and electrocautery and scissors to sharply debride the muscles the fascia the tendons of the 5th ray flush with the level of the cuboid bone.  Adjacent muscles of the 4th ray that were necrotic were also sharply debrided to healthy margins.    The wound was now irrigated.  Hemostasis was secured.  I packed the wound open in preparation for negative pressure wound therapy.   Post debridement the wound measured 12 x 7 cm maximum dimension    Patient tolerated the procedure well.  There were no complications.  Blood loss was minimal    Findings:  As noted above  Remaining tissues of the 4th ray and residual tissues of the right foot appeared viable             Attending Surgeon: Norleen DELENA Robe, MD    Assistant(s): None    Anesthesia Type: General     Estimated Blood Loss:  Minimal    Blood Given: None    Fluids Given: Per Anesthesia Records     Complications (not routinely expected or not inherent to difficulty/nature of procedure): None    Characteristic Event (routinely expected or inherent to the difficulty/nature of the procedure): None    Did the use of current and/or prior Anticoagulants impact the outcome of the case?no    Wound Class: Dirty or Infected Wounds - Include old traumatic wounds    Tubes: None    Drains: None    Specimens/ Cultures:  All debrided tissue and drained abscess    Implants: None           Disposition: PACU - hemodynamically stable.    Condition: stable    Kasarah Sitts A Leta Bucklin, MD11/11/202523:25

## 2023-12-05 NOTE — Anesthesia Postprocedure Evaluation (Signed)
 Anesthesia Post Op Evaluation    Patient: Yvette Schmidt  Procedure(s):  RIGHT FOOT 4TH AND 5TH RAY AMPUTATION  DEBRIDEMENT RIGHT FOOT POSSIBLE GUILTIONE    Last Vitals:Temperature: 36.4 C (97.5 F) (12/05/23 1313)  Heart Rate: 92 (12/05/23 2012)  BP (Non-Invasive): (!) 136/92 (12/05/23 2012)  Respiratory Rate: 18 (12/05/23 2012)  SpO2: 94 % (12/05/23 2012)    No notable events documented.    Patient is sufficiently recovered from the effects of anesthesia to participate in the evaluation and has returned to their pre-procedure level.  Patient location during evaluation: PACU       Patient participation: complete - patient participated  Level of consciousness: responsive to verbal stimuli    Pain management: adequate  Airway patency: patent    Anesthetic complications: no  Cardiovascular status: acceptable  Respiratory status: acceptable  Hydration status: acceptable  Patient post-procedure temperature: Pt Normothermic   PONV Status: Absent

## 2023-12-05 NOTE — H&P (Signed)
 [facility]  [address]  [place] [address]  Phone: [phone]  Fax: [phone]      Encounter Date: 12/05/2023    Patient ID:  Yvette Schmidt  FMW:[id number]    DOB: 1975-09-10  Age: 48 y.o. female    Subjective:     Chief Complaint   Patient presents with    New Patient     Room 7~ New Patient Osteomyelitis of rt foot        HPI  Yvette Schmidt is a 48 y.o. female who was seen today for evaluation of gangrenous right diabetic foot infection. Patient has had ulcer for approximately 6 weeks. She was evaluated in the ED on Friday for wound. It was recommended she be admitted for IV antibiotics and surgical debridement. CT demonstrated left foot and leg cellulitis with right foot abscess and soft tissue gas. She declined admission as she needed to care for her husband who recently was discharged from the hospital aortobifemoral bypass and left TMA. She endorses significant right foot pain. Wound is gangrenous in appearance.     Current Outpatient Medications   Medication Sig    ACCU-CHEK GUIDE GLUCOSE METER Does not apply Misc Use three times daily with lancets and test strips    albuterol  sulfate (PROVENTIL ) 2.5 mg /3 mL (0.083 %) Inhalation nebulizer solution Inhale 3 mL (2.5 mg total) Via Nebulizer Every 6 hours as needed for Wheezing    albuterol  sulfate (VENTOLIN  HFA) 90 mcg/actuation Inhalation oral inhaler INHALE 1-2 PUFFS BY INHALATION EVERY 6 HOURS AS NEEDED    amitriptyline  (ELAVIL ) 50 mg Oral Tablet Take 1 Tablet (50 mg total) by mouth Every night for 90 days    azelastine  (ASTELIN ) 137 mcg (0.1 %) Nasal Spray, Non-Aerosol ADMINISTER 1 SPRAY INTO EACH NOSTRIL TWICE DAILY FOR 30 DAYS    betamethasone  valerate (VALISONE ) 0.1 % Ointment Apply topically Twice daily    Blood Sugar Diagnostic (ACCU-CHEK GUIDE TEST STRIPS) Does not apply Strip USE 1 STRIP TO CHECK BLOOD SUGAR FOUR TIMES DAILY BEFORE MEALS AND AT BEDTIME    cetirizine  (ZYRTEC ) 10 mg Oral Tablet Take  1 Tablet (10 mg total) by mouth Daily for 90 days    ciprofloxacin HCl (CIPRO) 750 mg Oral Tablet Take 1 Tablet (750 mg total) by mouth Twice daily for 10 days    clonazePAM  (KLONOPIN ) 0.5 mg Oral Tablet Take 1 Tablet (0.5 mg total) by mouth Twice daily    EPINEPHrine  0.3 mg/0.3 mL Injection Auto-Injector Inject 0.3 mL (0.3 mg total) into the muscle Once, as needed for up to 2 doses    fluticasone  propionate (FLONASE ) 50 mcg/actuation Nasal Spray, Suspension Administer 2 Sprays into each nostril Twice daily    furosemide  (LASIX ) 40 mg Oral Tablet Take 1 Tablet (40 mg total) by mouth Once a day    gabapentin  (NEURONTIN ) 800 mg Oral Tablet Take 1/2 tablet by mouth in the morning and afternoon, then take 1 tablet in the evening.    insulin  aspart U-100 (NOVOLOG ) 100 unit/mL (3 mL) Subcutaneous Insulin  Pen Inject under the skin Twice a day before meals per Sliding Scale. <150 No injection, 150-200 2 units, 201-250 4 units, 251-300 6 units, 301-350 9 units, 351-400 12 units    Insulin  Needles, Disposable, (SURE COMFORT PEN NEEDLE) 32 gauge x 1/4 Needle USE TO INJECT INSULIN  TWICE DAILY    ipratropium (ATROVENT ) 0.02 % Inhalation Solution Take 1.25 mL (0.25 mg total) by nebulization Every  6 hours    Lancets (ACCU-CHEK SOFTCLIX LANCETS) Misc Use to Check blood Sugar Three Times Daily    linaCLOtide  (LINZESS ) 290 mcg Oral Capsule Take 1 Capsule (290 mcg total) by mouth Every morning    meloxicam  (MOBIC ) 15 mg Oral Tablet TAKE 1 TABLET BY MOUTH EVERY DAY AT BEDTIME    metroNIDAZOLE (FLAGYL) 500 mg Oral Tablet Take 1 Tablet (500 mg total) by mouth Three times a day for 10 days    mupirocin  (BACTROBAN ) 2 % Ointment Apply topically Three times a day for 30 days    Nebulizers Misc Use with Nebulizer medication    pantoprazole  (PROTONIX ) 40 mg Oral Tablet, Delayed Release (E.C.) Take 1 Tablet (40 mg total) by mouth Twice daily    rosuvastatin  (CRESTOR ) 20 mg Oral Tablet Take 1 Tablet (20 mg total) by mouth Every evening     SITagliptin  phosphate (JANUVIA ) 100 mg Oral Tablet Take 1 Tablet (100 mg total) by mouth Once a day    SUMAtriptan  (IMITREX ) 50 mg Oral Tablet TAKE 1 TABLET BY MOUTH ONCE DAILY AS NEEDED FOR MIGRAINES MAY REPEAT IN 2 HOURS IF NEEDED FOR 30 DAYS DO NOT EXCEED MORE THAN 9 DOSES IN A 30 DAY PERIOD    SYMBICORT  160-4.5 mcg/actuation Inhalation oral inhaler TAKE 2 PUFFS BY INHALATION TWICE DAILY     Allergies[1]    Past Medical History:   Diagnosis Date    Asthma     COPD (chronic obstructive pulmonary disease)     Diabetes mellitus, type 2     Gastroparesis     GERD (gastroesophageal reflux disease)     Hypertension     MRSA (methicillin resistant staph aureus) culture positive 10/10/2022    Right buttock wound         Past Surgical History:   Procedure Laterality Date    HX HYSTERECTOMY  2012    Left ooperectomy, R. ovary retained    HX LAP CHOLECYSTECTOMY  05/12/2017    PB COLONOSCOPY,DIAGNOSTIC           Family Medical History:    None         Social History[2]    Review of  Systems:  Constitutional Positive for: Fever, Chills, Weight Loss, Malaise/Fatigue, Diaphoresis, Weakness        Skin Negative for: Mass, Rash, Itching, Lesion, Mole, Changing lesion, Growing lesion, Painful lesion, Lesion drainage, Ulcers  HENT Positive for: Headaches, Tinnitus, Congestion, Stridor  HENT Negative for: Sore Throat, Nosebleeds, Ear Discharge, Ear Pain, Hearing Loss  Eyes Positive for: Photophobia  Eyes Negative for: Blurred Vision, Double Vision, Eye Pain, Eye Discharge, Eye Redness  Cardiovascular Positive for: Orthopnea, Leg Swelling, PND  Cardiovascular Negative for: Claudication, Palpitations, Chest Pain  Respiratory Positive for: Cough, Shortness of Breath, Wheezing  Respiratory Negative for: Sputum Production, Hemoptysis  Gastrointestinal Positive for: Heartburn, Constipation  Gastrointestinal Negative for: Blood in Stool, Melena, Dysphagia, Diarrhea, Abdominal Pain, Vomiting, Nausea  Genitourinary Positive for: Urgency,  Flank Pain  Genitourinary Negative for: Hematuria, Frequency, Dysuria  Musculoskeletal: Myalgias, Neck Pain, Back Pain, Joint Pain  Musculoskeletal Negative for: Falls  Endo/Heme/Allergy Positive for: Easy Bruise/Bleed, Env Allergies, Polydipsia     Neurological Positive for: Tingling, Tremor  Neurological Negative for : Dizziness, Sensory Change, Speech Change, Focal Weakness, Seizures, LOC  Psychiatric Positive for: Nervous/Anxious  Psychological Negative for: Memory Loss, Insomnia, Substance Abuse, Hallucinations, Suicidal Ideas, Depression  Objective:   Vitals: BP (!) 146/90 (Patient Position: Sitting)   Pulse 91   Temp 37 C (98.6 F)   Ht 1.651 m (5' 5)   Wt 133 kg (292 lb 11.2 oz)   SpO2 95%   BMI 48.71 kg/m         General Exam:    General:  acutely ill  Lungs:  Breathing nonlabored  Cardiovascular:  regular rate and rhythm, S1, S2 normal, no murmur, click, rub or gallop  Extremities:  see pictures  Neurologic: Grossly normal. Alert and oriented x3  Vascular    pulses 1+ throughout                  Ancillary Tests Reviewed:      PACS Images     Show images for CT ANGIO LOWER EXTREMITY RIGHT W IV CONTRAST    [facility]   [address]  [place] [address]   Phone: [phone] Fax: [phone]     PATIENT NAME: Yvette Schmidt, Yvette Schmidt MED REC NO: [id number]   BIRTH DATE: [birth year] ORDER: [id number]   SEX: F ORDER FROM: [facility]   REQUESTING PHYS: Glynn Mort SERVICE DATE: 12/03/2023 00:47    ATTENDING GENNAROBETHA Glynn Mort REPORT DATE: 12/03/2023 01:47   REASON:         Final  CT ANGIO LOWER EXTREMITY RIGHT W IV CONTRAST                          Study Result    Narrative & Impression   Yvette Schmidt     PROCEDURE DESCRIPTION: CT ANGIO LOWER EXTREMITY RIGHT W IV CONTRAST     3D/MIP reconstructions were generated.     CLINICAL INDICATION: foot infection with necrotic 5th digit; eval for fluid collection/abscess, osteo, cellulitis/necrotizing  SSTI     CONTRAST: 125 cc [id number].     COMPARISON: X-ray 12/02/2023.     FINDINGS: There is an soft tissue ulceration overlying the fifth metatarsal head/MTP joint with air in the soft tissues extending along the length of the fifth metatarsal and around the fifth MTP joint. Erosion at the fifth metatarsal head consistent with osteomyelitis. Concern for fluid collection around the fifth metatarsal head measuring approximately 2.4 x 1.4 x 1.5 cm concerning for possible abscess. There is also edema in the overlying soft tissues of the midfoot and forefoot. There is diffuse edema in the soft tissues below the right knee, and to lesser degree in the right thigh. Degenerative arthrosis at the right knee, advanced in the patellofemoral compartment. Mild degenerative change at the right hip. Moderate degenerative change at the right ankle.     IMPRESSION:  1. Findings consistent with osteomyelitis involving the fifth metatarsal head. Surrounding fluid collection concerning for possible abscess. Overlying soft tissue ulceration with soft tissue air.  2. Diffuse edema in the soft tissues below the knee, to lesser extent involving the right thigh.  3. Other chronic findings as above.        The CT exam was performed using one or more of the following dose reduction techniques: Automated exposure control, adjustment of the mA and/or kV according to the patient's size, or use of iterative reconstruction technique.           ENCOUNTER DIAGNOSES     ICD-10-CM   1. Diabetic foot infection (CMS HCC)  (CMS HCC)  E11.628    L08.9   2. Cellulitis and abscess  of right leg  L03.115    L02.415   3. Gangrene of right foot (CMS HCC)  I96       Assessment     Right diabetic foot infection with gangrenous ulcer          Plan     Will be taken tonight to OR for right foot debridement  possible guillotine  Risks benefits alternatives surgical intervention were explained the patient  All questions asked and answered  Appropriate consents have  been obtained         No orders of the defined types were placed in this encounter.      No follow-ups on file.    Wong Steadham, APRN, CNP    This note was partially generated using MModal Fluency Direct system, and there may be some incorrect words, spellings, and punctuation that were not noted in checking the note before saving.    I am scribing for, and in the presence of, Dr. Adeniyi  for services provided on 12/05/2023.  Toriann Spadoni, APRN, CNP     I spent a total of 40 min of time for today's visit. This included reviewing the medical record, obtaining the history, performing the physical exam, reviewing imaging studies, directly coordinating care with other providers, counseling on treatment options and their associated risks          [1]   Allergies  Allergen Reactions    Beeswax      Allergic to Bee Stings    Clindamycin     Hydrocodone     Other      Nutrasweet, sugar subs    Sulfa (Sulfonamides)      HIVES    Theophylline      ANXIETY   [2]   Social History  Tobacco Use    Smoking status: Every Day     Current packs/day: 0.50     Average packs/day: 0.5 packs/day for 15.0 years (7.5 ttl pk-yrs)     Types: Cigarettes    Smokeless tobacco: Never   Substance Use Topics    Alcohol use: Not Currently     Comment: seldom    Drug use: Not Currently     Types: Marijuana     Comment: occasionally

## 2023-12-05 NOTE — H&P (Signed)
 Schmidt, Jordan, APRN, CNP   Nurse Practitioner  Specialty: FAMILY NURSE PRACTITIONER     H&P     Signed     Encounter Date: 12/05/2023     Expand All Collapse All    [facility]  [address]  [place] [address]  Phone: [phone]  Fax: [phone]        Encounter Date: 12/05/2023     Patient ID:  Yvette Schmidt  [id number]    DOB: [birth year]  Age: 48 y.o. female     Subjective:           Chief Complaint   Patient presents with    New Patient       Room 7~ New Patient Osteomyelitis of rt foot          HPI  Yvette Schmidt is a 48 y.o. female who was seen today for evaluation of gangrenous right diabetic foot infection. Patient has had ulcer for approximately 6 weeks. She was evaluated in the ED on Friday for wound. It was recommended she be admitted for IV antibiotics and surgical debridement. CT demonstrated left foot and leg cellulitis with right foot abscess and soft tissue gas. She declined admission as she needed to care for her husband who recently was discharged from the hospital aortobifemoral bypass and left TMA. She endorses significant right foot pain. Wound is gangrenous in appearance.           Current Outpatient Medications   Medication Sig    ACCU-CHEK GUIDE GLUCOSE METER Does not apply Misc Use three times daily with lancets and test strips    albuterol  sulfate (PROVENTIL ) 2.5 mg /3 mL (0.083 %) Inhalation nebulizer solution Inhale 3 mL (2.5 mg total) Via Nebulizer Every 6 hours as needed for Wheezing    albuterol  sulfate (VENTOLIN  HFA) 90 mcg/actuation Inhalation oral inhaler INHALE 1-2 PUFFS BY INHALATION EVERY 6 HOURS AS NEEDED    amitriptyline  (ELAVIL ) 50 mg Oral Tablet Take 1 Tablet (50 mg total) by mouth Every night for 90 days    azelastine  (ASTELIN ) 137 mcg (0.1 %) Nasal Spray, Non-Aerosol ADMINISTER 1 SPRAY INTO EACH NOSTRIL TWICE DAILY FOR 30 DAYS    betamethasone  valerate (VALISONE ) 0.1 % Ointment Apply topically Twice daily    Blood Sugar  Diagnostic (ACCU-CHEK GUIDE TEST STRIPS) Does not apply Strip USE 1 STRIP TO CHECK BLOOD SUGAR FOUR TIMES DAILY BEFORE MEALS AND AT BEDTIME    cetirizine  (ZYRTEC ) 10 mg Oral Tablet Take 1 Tablet (10 mg total) by mouth Daily for 90 days    ciprofloxacin HCl (CIPRO) 750 mg Oral Tablet Take 1 Tablet (750 mg total) by mouth Twice daily for 10 days    clonazePAM  (KLONOPIN ) 0.5 mg Oral Tablet Take 1 Tablet (0.5 mg total) by mouth Twice daily    EPINEPHrine  0.3 mg/0.3 mL Injection Auto-Injector Inject 0.3 mL (0.3 mg total) into the muscle Once, as needed for up to 2 doses    fluticasone  propionate (FLONASE ) 50 mcg/actuation Nasal Spray, Suspension Administer 2 Sprays into each nostril Twice daily    furosemide  (LASIX ) 40 mg Oral Tablet Take 1 Tablet (40 mg total) by mouth Once a day    gabapentin  (NEURONTIN ) 800 mg Oral Tablet Take 1/2 tablet by mouth in the morning and afternoon, then take 1 tablet in the evening.    insulin  aspart U-100 (NOVOLOG ) 100 unit/mL (3 mL) Subcutaneous Insulin  Pen Inject under the skin Twice a day before  meals per Sliding Scale. <150 No injection, 150-200 2 units, 201-250 4 units, 251-300 6 units, 301-350 9 units, 351-400 12 units    Insulin  Needles, Disposable, (SURE COMFORT PEN NEEDLE) 32 gauge x 1/4 Needle USE TO INJECT INSULIN  TWICE DAILY    ipratropium (ATROVENT ) 0.02 % Inhalation Solution Take 1.25 mL (0.25 mg total) by nebulization Every 6 hours    Lancets (ACCU-CHEK SOFTCLIX LANCETS) Misc Use to Check blood Sugar Three Times Daily    linaCLOtide  (LINZESS ) 290 mcg Oral Capsule Take 1 Capsule (290 mcg total) by mouth Every morning    meloxicam  (MOBIC ) 15 mg Oral Tablet TAKE 1 TABLET BY MOUTH EVERY DAY AT BEDTIME    metroNIDAZOLE (FLAGYL) 500 mg Oral Tablet Take 1 Tablet (500 mg total) by mouth Three times a day for 10 days    mupirocin  (BACTROBAN ) 2 % Ointment Apply topically Three times a day for 30 days    Nebulizers Misc Use with Nebulizer medication    pantoprazole  (PROTONIX ) 40 mg  Oral Tablet, Delayed Release (E.C.) Take 1 Tablet (40 mg total) by mouth Twice daily    rosuvastatin  (CRESTOR ) 20 mg Oral Tablet Take 1 Tablet (20 mg total) by mouth Every evening    SITagliptin  phosphate (JANUVIA ) 100 mg Oral Tablet Take 1 Tablet (100 mg total) by mouth Once a day    SUMAtriptan  (IMITREX ) 50 mg Oral Tablet TAKE 1 TABLET BY MOUTH ONCE DAILY AS NEEDED FOR MIGRAINES MAY REPEAT IN 2 HOURS IF NEEDED FOR 30 DAYS DO NOT EXCEED MORE THAN 9 DOSES IN A 30 DAY PERIOD    SYMBICORT  160-4.5 mcg/actuation Inhalation oral inhaler TAKE 2 PUFFS BY INHALATION TWICE DAILY      [Allergies]    [Allergies]        Allergen Reactions    Beeswax         Allergic to Bee Stings    Clindamycin      Hydrocodone      Other         Nutrasweet, sugar subs    Sulfa (Sulfonamides)         HIVES    Theophylline         ANXIETY             Past Medical History:   Diagnosis Date    Asthma      COPD (chronic obstructive pulmonary disease)      Diabetes mellitus, type 2      Gastroparesis      GERD (gastroesophageal reflux disease)      Hypertension      MRSA (methicillin resistant staph aureus) culture positive 10/10/2022     Right buttock wound                  Past Surgical History:   Procedure Laterality Date    HX HYSTERECTOMY   2012     Left ooperectomy, R. ovary retained    HX LAP CHOLECYSTECTOMY   05/12/2017    PB COLONOSCOPY,DIAGNOSTIC                Family Medical History:       None               [Social History]    [Social History]        Tobacco Use    Smoking status: Every Day       Current packs/day: 0.50       Average packs/day: 0.5 packs/day for 15.0 years (7.5  ttl pk-yrs)       Types: Cigarettes    Smokeless tobacco: Never   Substance Use Topics    Alcohol use: Not Currently       Comment: seldom    Drug use: Not Currently       Types: Marijuana       Comment: occasionally        Review of  Systems:  Constitutional Positive for: Fever, Chills, Weight Loss, Malaise/Fatigue, Diaphoresis, Weakness  Skin Negative for: Mass,  Rash, Itching, Lesion, Mole, Changing lesion, Growing lesion, Painful lesion, Lesion drainage, Ulcers  HENT Positive for: Headaches, Tinnitus, Congestion, Stridor  HENT Negative for: Sore Throat, Nosebleeds, Ear Discharge, Ear Pain, Hearing Loss  Eyes Positive for: Photophobia  Eyes Negative for: Blurred Vision, Double Vision, Eye Pain, Eye Discharge, Eye Redness  Cardiovascular Positive for: Orthopnea, Leg Swelling, PND  Cardiovascular Negative for: Claudication, Palpitations, Chest Pain  Respiratory Positive for: Cough, Shortness of Breath, Wheezing  Respiratory Negative for: Sputum Production, Hemoptysis  Gastrointestinal Positive for: Heartburn, Constipation  Gastrointestinal Negative for: Blood in Stool, Melena, Dysphagia, Diarrhea, Abdominal Pain, Vomiting, Nausea  Genitourinary Positive for: Urgency, Flank Pain  Genitourinary Negative for: Hematuria, Frequency, Dysuria  Musculoskeletal: Myalgias, Neck Pain, Back Pain, Joint Pain  Musculoskeletal Negative for: Falls  Endo/Heme/Allergy Positive for: Easy Bruise/Bleed, Env Allergies, Polydipsia  Neurological Positive for: Tingling, Tremor  Neurological Negative for : Dizziness, Sensory Change, Speech Change, Focal Weakness, Seizures, LOC  Psychiatric Positive for: Nervous/Anxious  Psychological Negative for: Memory Loss, Insomnia, Substance Abuse, Hallucinations, Suicidal Ideas, Depression                                      Objective:   Vitals: BP (!) 146/90 (Patient Position: Sitting)   Pulse 91   Temp 37 C (98.6 F)   Ht 1.651 m (5' 5)   Wt 133 kg (292 lb 11.2 oz)   SpO2 95%   BMI 48.71 kg/m            General Exam:     General:  acutely ill  Lungs:  Breathing nonlabored  Cardiovascular:  regular rate and rhythm, S1, S2 normal, no murmur, click, rub or gallop  Extremities:  see pictures  Neurologic: Grossly normal. Alert and oriented x3  Vascular    pulses 1+ throughout                      Ancillary Tests Reviewed:       PACS Images      Show  images for CT ANGIO LOWER EXTREMITY RIGHT W IV CONTRAST     [facility], [facility]   [address]  [place] [address]   Phone: [phone] Fax: [phone]     PATIENT NAME: Itha, Kroeker MED REC NO: [id number]   BIRTH DATE: [birth year] ORDER: [id number]   SEX: F ORDER FROM: [facility]   REQUESTING PHYS: Glynn Mort SERVICE DATE: 12/03/2023 00:47    ATTENDING GENNAROBETHA Glynn Mort REPORT DATE: 12/03/2023 01:47   REASON:         Final  CT ANGIO LOWER EXTREMITY RIGHT W IV CONTRAST                                 Study Result     Narrative & Impression   MRS.  Babbie D Lebon     PROCEDURE DESCRIPTION: CT ANGIO LOWER EXTREMITY RIGHT W IV CONTRAST     3D/MIP reconstructions were generated.     CLINICAL INDICATION: foot infection with necrotic 5th digit; eval for fluid collection/abscess, osteo, cellulitis/necrotizing SSTI     CONTRAST: 125 cc Isovue-370.     COMPARISON: X-ray 12/02/2023.     FINDINGS: There is an soft tissue ulceration overlying the fifth metatarsal head/MTP joint with air in the soft tissues extending along the length of the fifth metatarsal and around the fifth MTP joint. Erosion at the fifth metatarsal head consistent with osteomyelitis. Concern for fluid collection around the fifth metatarsal head measuring approximately 2.4 x 1.4 x 1.5 cm concerning for possible abscess. There is also edema in the overlying soft tissues of the midfoot and forefoot. There is diffuse edema in the soft tissues below the right knee, and to lesser degree in the right thigh. Degenerative arthrosis at the right knee, advanced in the patellofemoral compartment. Mild degenerative change at the right hip. Moderate degenerative change at the right ankle.     IMPRESSION:  1. Findings consistent with osteomyelitis involving the fifth metatarsal head. Surrounding fluid collection concerning for possible abscess. Overlying soft tissue ulceration with soft tissue air.  2. Diffuse edema in the soft tissues  below the knee, to lesser extent involving the right thigh.  3. Other chronic findings as above.        The CT exam was performed using one or more of the following dose reduction techniques: Automated exposure control, adjustment of the mA and/or kV according to the patient's size, or use of iterative reconstruction technique.                    ENCOUNTER DIAGNOSES       ICD-10-CM   1. Diabetic foot infection (CMS HCC)  (CMS HCC)  E11.628     L08.9   2. Cellulitis and abscess of right leg  L03.115     L02.415   3. Gangrene of right foot (CMS HCC)  I96         Assessment      Right diabetic foot infection with gangrenous ulcer              Plan      Will be taken tonight to OR for right foot debridement  possible guillotine  Risks benefits alternatives surgical intervention were explained the patient  All questions asked and answered  Appropriate consents have been obtained           No orders of the defined types were placed in this encounter.        No follow-ups on file.     Yvette Kesler, APRN, CNP     This note was partially generated using MModal Fluency Direct system, and there may be some incorrect words, spellings, and punctuation that were not noted in checking the note before saving.     I am scribing for, and in the presence of, Dr. Chai Verdejo  for services provided on 12/05/2023.  Yvette Kesler, APRN, CNP      I spent a total of 40 min of time for today's visit. This included reviewing the medical record, obtaining the history, performing the physical exam, reviewing imaging studies, directly coordinating care with other providers, counseling on treatment options and their associated risks              Electronically signed by Schmidt, Jordan, APRN, CNP at  12/05/23 1138    The history, physical exam was directly supervised by me. The interpretation of ancillary tests was performed by me. I also have  reviewed and confirmed the ROS, Past Medical Histoy, Past Family History, and exam elements performed and  documented by the support staff. The scribed portion of the progress note was scribed on my behalf and at my direction.  I have reviewed and attest to the accuracy of the note.    Hamzeh Tall A Krishawn Vanderweele, MD [facility] [address]   [facility]  H&P Update Form    Arianny, Pun, 48 y.o. female  Date of Admission:  12/05/2023  Date of Birth:  Feb 28, 1975    12/05/2023    STOP: IF H&P IS GREATER THAN 30 DAYS FROM SURGICAL DAY COMPLETE NEW H&P IS REQUIRED.     H & P updated the day of the procedure.  1.  H&P completed within 30 days of surgical procedure and has been reviewed within 24 hours of the surgery, the patient has been examined, and no change has occured in the patients condition since the H&P was completed.       Change in medications: No      Comments:     2.  Patient continues to be appropiate candidate for planned surgical procedure. YES    3.  In addition to the risks associated with this particular procedure that have been explained to the patient as well as the benefit to the procedure, the patient also understands that at this time there is additional environmental risks associated with the presence of COVID -19 in our hospital. Patient consents to the procedure accepting this additional risk.    Milburn Freeney A Wardell Pokorski, MD

## 2023-12-05 NOTE — Anesthesia Preprocedure Evaluation (Addendum)
 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 (Right)  DEBRIDEMENT RIGHT FOOT POSSIBLE GUILTIONE (Right)  Review of Systems     anesthesia history negative     patient summary reviewed          Pulmonary   COPD, asthma and sleep apnea,  denies history of smoking and not a current smoker   Cardiovascular    Hypertension ,No peripheral edema, no CAD and no angina,        GI/Hepatic/Renal    GERD (no sx today) and well controlled no liver disease and no renal insufficiency        Endo/Other    morbid obesity, no hypothyroidism   type 2 diabetes/ controlled with insulin     Neuro/Psych/MS    headaches, anxiety, depression no seizures, no CVA       Cancer                        Physical Assessment      Airway       Mallampati: III      Neck ROM: full  Mouth Opening: fair.            Dental                    Pulmonary    Breath sounds clear to auscultation  (-) no rhonchi, no decreased breath sounds, no wheezes, no rales and no stridor     Cardiovascular    Rhythm: regular  Rate: Normal  (-) no friction rub, carotid bruit is not present, no peripheral edema and no murmur     Other findings              Plan  ASA 3     Planned anesthesia type: general     total intravenous anesthesia                    Intravenous induction       Anesthetic plan and risks discussed with patient           Patient's NPO status is appropriate for Anesthesia.           Plan discussed with CRNA.    (LMA if GA required)

## 2023-12-05 NOTE — Anesthesia Transfer of Care (Signed)
 ANESTHESIA TRANSFER OF CARE   Yvette Schmidt is a 48 y.o. 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 5TH RAY AMPUTATION  DEBRIDEMENT RIGHT FOOT POSSIBLE GUILTIONE  performed  12/05/23   Primary Service: Norleen DELENA Robe, MD    Past Medical History:   Diagnosis Date    Asthma     COPD (chronic obstructive pulmonary disease)     Diabetes mellitus, type 2     Gastroparesis     GERD (gastroesophageal reflux disease)     Hypertension     MRSA (methicillin resistant staph aureus) culture positive 10/10/2022    Right buttock wound      Allergy History as of 12/05/23       THEOPHYLLINE         Noted Status Severity Type Reaction    01/09/07   Active       Comments: ANXIETY               SULFA (SULFONAMIDES)         Noted Status Severity Type Reaction    01/09/07   Active       Comments: HIVES               HYDROCODONE         Noted Status Severity Type Reaction    12/12/15 0448 Galvin Setter, RN 12/12/15 Active                 BEESWAX         Noted Status Severity Type Reaction    12/12/15 0449 Galvin Setter, RN 12/12/15 Active       Comments: Allergic to Bee Stings     12/12/15 0448 Galvin Setter, RN 12/12/15 Active                 OTHER         Noted Status Severity Type Reaction    12/12/15 0449 Galvin Setter, RN 12/12/15 Active       Comments: Nutrasweet, sugar subs               CLINDAMYCIN         Noted Status Severity Type Reaction    12/12/15 0449 Galvin Setter, RN 12/12/15 Active                     I completed my transfer of care / handoff to the receiving personnel during which we discussed:  Access, Airway, All key/critical aspects of case discussed, Analgesia, Antibiotics, Expectation of post procedure, Fluids/Product, Gave opportunity for questions and acknowledgement of understanding, Labs and PMHx      Post Location: PACU                                                             Last OR Temp: Temperature: 36.4 C (97.5  F)      Airway:* No LDAs found *  Blood pressure (!) 136/92, pulse 92, temperature 36.4 C (97.5 F), resp. rate 18, height 1.651 m (5' 5), weight 132 kg (291 lb 0.1 oz), SpO2 94%.

## 2023-12-06 ENCOUNTER — Inpatient Hospital Stay (HOSPITAL_COMMUNITY)
Admission: RE | Admit: 2023-12-06 | Discharge: 2023-12-06 | Disposition: A | Discharge status: Home / Self Care | Payer: MEDICAID | Source: Ambulatory Visit | Admitting: Radiology

## 2023-12-06 DIAGNOSIS — E11628 Type 2 diabetes mellitus with other skin complications: Secondary | ICD-10-CM

## 2023-12-06 DIAGNOSIS — Z029 Encounter for administrative examinations, unspecified: Secondary | ICD-10-CM

## 2023-12-06 DIAGNOSIS — I119 Hypertensive heart disease without heart failure: Secondary | ICD-10-CM

## 2023-12-06 DIAGNOSIS — A480 Gas gangrene: Secondary | ICD-10-CM

## 2023-12-06 DIAGNOSIS — E1152 Type 2 diabetes mellitus with diabetic peripheral angiopathy with gangrene: Principal | ICD-10-CM

## 2023-12-06 DIAGNOSIS — I5030 Unspecified diastolic (congestive) heart failure: Secondary | ICD-10-CM

## 2023-12-06 DIAGNOSIS — M869 Osteomyelitis, unspecified: Secondary | ICD-10-CM

## 2023-12-06 DIAGNOSIS — I444 Left anterior fascicular block: Secondary | ICD-10-CM

## 2023-12-06 DIAGNOSIS — Z89421 Acquired absence of other right toe(s): Secondary | ICD-10-CM

## 2023-12-06 DIAGNOSIS — J4489 Other specified chronic obstructive pulmonary disease: Secondary | ICD-10-CM

## 2023-12-06 DIAGNOSIS — L02611 Cutaneous abscess of right foot: Secondary | ICD-10-CM

## 2023-12-06 DIAGNOSIS — E1169 Type 2 diabetes mellitus with other specified complication: Secondary | ICD-10-CM

## 2023-12-06 DIAGNOSIS — M86171 Other acute osteomyelitis, right ankle and foot: Secondary | ICD-10-CM

## 2023-12-06 LAB — COMPREHENSIVE METABOLIC PANEL, NON-FASTING
ALBUMIN: 2.7 g/dL — ABNORMAL LOW (ref 3.5–5.0)
ALKALINE PHOSPHATASE: 143 U/L — ABNORMAL HIGH (ref 40–110)
ALT (SGPT): 12 U/L (ref <31)
ANION GAP: 10 mmol/L (ref 4–13)
AST (SGOT): 22 U/L (ref 11–34)
BILIRUBIN TOTAL: 0.3 mg/dL (ref 0.3–1.3)
BUN/CREA RATIO: 14 (ref 6–22)
BUN: 10 mg/dL (ref 8–25)
CALCIUM: 9.1 mg/dL (ref 8.6–10.2)
CHLORIDE: 96 mmol/L (ref 96–111)
CO2 TOTAL: 26 mmol/L (ref 22–30)
CREATININE: 0.74 mg/dL (ref 0.60–1.05)
GLUCOSE: 478 mg/dL (ref 65–125)
POTASSIUM: 4.8 mmol/L (ref 3.5–5.1)
PROTEIN TOTAL: 7.2 g/dL (ref 6.4–8.3)
SODIUM: 132 mmol/L — ABNORMAL LOW (ref 136–145)
eGFRcr - FEMALE: >90 mL/min/1.73mˆ2 (ref >=60)

## 2023-12-06 LAB — LIPID PANEL
CHOL/HDL RATIO: 4.8
CHOLESTEROL: 87 mg/dL — ABNORMAL LOW (ref 100–200)
HDL CHOL: 18 mg/dL — ABNORMAL LOW (ref >=50)
LDL CALC: 45 mg/dL (ref <100)
NON-HDL: 69 mg/dL (ref <=190)
TRIGLYCERIDES: 135 mg/dL (ref <150)
VLDL CALC: 19 mg/dL (ref <30)

## 2023-12-06 LAB — BASIC METABOLIC PANEL
ANION GAP: 9 mmol/L (ref 4–13)
BUN/CREA RATIO: 11 (ref 6–22)
BUN: 7 mg/dL — ABNORMAL LOW (ref 8–25)
CALCIUM: 8.8 mg/dL (ref 8.6–10.2)
CHLORIDE: 99 mmol/L (ref 96–111)
CO2 TOTAL: 26 mmol/L (ref 22–30)
CREATININE: 0.62 mg/dL (ref 0.60–1.05)
GLUCOSE: 288 mg/dL — ABNORMAL HIGH (ref 65–125)
POTASSIUM: 4.5 mmol/L (ref 3.5–5.1)
SODIUM: 134 mmol/L — ABNORMAL LOW (ref 136–145)
eGFRcr - FEMALE: >90 mL/min/1.73mˆ2 (ref >=60)

## 2023-12-06 LAB — HGA1C (HEMOGLOBIN A1C WITH EST AVG GLUCOSE)
ESTIMATED AVERAGE GLUCOSE: 286 mg/dL
HEMOGLOBIN A1C: 11.6 % — ABNORMAL HIGH (ref 4.1–5.7)

## 2023-12-06 LAB — ECG 12 LEAD - ADULT
Atrial Rate: 86 {beats}/min
Calculated P Axis: 42 degrees
Calculated R Axis: -52 degrees
Calculated T Axis: 39 degrees
PR Interval: 166 ms
QRS Duration: 110 ms
QT Interval: 396 ms
QTC Calculation: 473 ms
Ventricular rate: 86 {beats}/min

## 2023-12-06 LAB — POC BLOOD GLUCOSE (RESULTS)
GLUCOSE, POC: 404 mg/dL — ABNORMAL HIGH (ref 70–110)
GLUCOSE, POC: 419 mg/dL — ABNORMAL HIGH (ref 70–110)
GLUCOSE, POC: 402 mg/dL — ABNORMAL HIGH (ref 70–110)
GLUCOSE, POC: 405 mg/dL — ABNORMAL HIGH (ref 70–110)
GLUCOSE, POC: 509 mg/dL — ABNORMAL HIGH (ref 70–110)

## 2023-12-06 LAB — THYROID STIMULATING HORMONE WITH FREE T4 REFLEX: TSH: 1.627 u[IU]/mL (ref 0.350–4.940)

## 2023-12-06 MED ORDER — INSULIN LISPRO 100 UNIT/ML SUB-Q SSIP VIAL
4.0000 [IU] | INJECTION | Freq: Four times a day (QID) | SUBCUTANEOUS | Status: DC
Start: 2023-12-06 — End: 2023-12-07
  Administered 2023-12-06 (×3): 18 [IU] via SUBCUTANEOUS
  Administered 2023-12-06: 4 [IU] via SUBCUTANEOUS
  Administered 2023-12-06: 0 [IU] via SUBCUTANEOUS
  Administered 2023-12-07: 18 [IU] via SUBCUTANEOUS
  Filled 2023-12-06 (×5): qty 18

## 2023-12-06 MED ORDER — INSULIN LISPRO 100 UNIT/ML SUB-Q - CHARGE BY DOSE
20.0000 [IU] | Freq: Once | SUBCUTANEOUS | Status: AC
Start: 2023-12-06 — End: 2023-12-06
  Administered 2023-12-06: 20 [IU] via SUBCUTANEOUS
  Filled 2023-12-06: qty 20

## 2023-12-06 MED ORDER — INSULIN LISPRO 100 UNIT/ML SUB-Q SSIP VIAL
2.0000 [IU] | INJECTION | Freq: Four times a day (QID) | SUBCUTANEOUS | Status: DC
Start: 2023-12-06 — End: 2023-12-06
  Administered 2023-12-06: 0 [IU] via SUBCUTANEOUS

## 2023-12-06 MED ORDER — OXYCODONE 5 MG TABLET
5.0000 mg | ORAL_TABLET | ORAL | Status: DC | PRN
Start: 2023-12-06 — End: 2023-12-07
  Administered 2023-12-06 – 2023-12-07 (×6): 5 mg via ORAL
  Filled 2023-12-06 (×6): qty 1

## 2023-12-06 MED ORDER — MORPHINE 4 MG/ML INJECTION WRAPPER
4.0000 mg | INJECTION | INTRAMUSCULAR | Status: DC | PRN
Start: 2023-12-06 — End: 2023-12-10
  Administered 2023-12-06 – 2023-12-09 (×5): 4 mg via INTRAVENOUS
  Filled 2023-12-06 (×5): qty 1

## 2023-12-06 MED ORDER — VANCOMYCIN 10 GRAM INTRAVENOUS SOLUTION
15.0000 mg/kg | Freq: Two times a day (BID) | INTRAVENOUS | Status: DC
Start: 2023-12-06 — End: 2023-12-06

## 2023-12-06 MED ORDER — PHENOL 1.4 % MUCOSAL AEROSOL SPRAY
1.0000 | INHALATION_SPRAY | Status: DC | PRN
Start: 2023-12-06 — End: 2023-12-11
  Filled 2023-12-06: qty 177

## 2023-12-06 MED ORDER — SODIUM CHLORIDE 0.9 % INTRAVENOUS SOLUTION
INTRAVENOUS | Status: AC
Start: 2023-12-06 — End: 2023-12-06
  Administered 2023-12-06: 0 mL via INTRAVENOUS

## 2023-12-06 MED ORDER — INSULIN LISPRO 100 UNIT/ML SUB-Q SSIP VIAL
4.0000 [IU] | INJECTION | Freq: Four times a day (QID) | SUBCUTANEOUS | Status: DC
Start: 2023-12-06 — End: 2023-12-06

## 2023-12-06 MED ORDER — INSULIN GLARGINE 100 UNITS/ML SUBQ - CHARGE BY DOSE
20.0000 [IU] | Freq: Every day | SUBCUTANEOUS | Status: DC
Start: 2023-12-06 — End: 2023-12-07
  Administered 2023-12-06 – 2023-12-07 (×2): 20 [IU] via SUBCUTANEOUS
  Filled 2023-12-06: qty 20
  Filled 2023-12-06: qty 1

## 2023-12-06 MED ORDER — GABAPENTIN 400 MG CAPSULE
400.0000 mg | ORAL_CAPSULE | Freq: Every day | ORAL | Status: DC
Start: 2023-12-06 — End: 2023-12-11
  Administered 2023-12-06 – 2023-12-11 (×6): 400 mg via ORAL
  Filled 2023-12-06 (×6): qty 1

## 2023-12-06 MED ORDER — GLUCAGON 1 MG SOLUTION FOR INJECTION
1.0000 mg | Freq: Once | INTRAMUSCULAR | Status: DC | PRN
Start: 2023-12-06 — End: 2023-12-11

## 2023-12-06 MED ORDER — TRIAMCINOLONE ACETONIDE 0.1 % TOPICAL CREAM
TOPICAL_CREAM | Freq: Two times a day (BID) | CUTANEOUS | Status: DC
Start: 2023-12-06 — End: 2023-12-11
  Administered 2023-12-06 – 2023-12-10 (×5): 0 mL via TOPICAL
  Filled 2023-12-06: qty 15

## 2023-12-06 MED ORDER — GABAPENTIN 400 MG CAPSULE
800.0000 mg | ORAL_CAPSULE | Freq: Every evening | ORAL | Status: DC
Start: 2023-12-07 — End: 2023-12-11
  Administered 2023-12-07 – 2023-12-10 (×4): 800 mg via ORAL
  Filled 2023-12-06 (×4): qty 2

## 2023-12-06 MED ORDER — VANCOMYCIN IV - PHARMACIST TO DOSE PER PROTOCOL
Freq: Every day | Status: DC | PRN
Start: 2023-12-06 — End: 2023-12-06

## 2023-12-06 MED ORDER — SODIUM CHLORIDE 0.9 % INTRAVENOUS SOLUTION
4.5000 g | INTRAVENOUS | Status: AC
Start: 2023-12-06 — End: 2023-12-06
  Administered 2023-12-06: 0 g via INTRAVENOUS
  Administered 2023-12-06: 4.5 g via INTRAVENOUS
  Filled 2023-12-06: qty 20

## 2023-12-06 MED ORDER — INSULIN GLARGINE 100 UNITS/ML SUBQ - CHARGE BY DOSE
10.0000 [IU] | Freq: Once | SUBCUTANEOUS | Status: AC
Start: 2023-12-06 — End: 2023-12-06
  Administered 2023-12-06: 10 [IU] via SUBCUTANEOUS
  Filled 2023-12-06: qty 10

## 2023-12-06 MED ORDER — SODIUM CHLORIDE 0.9 % INTRAVENOUS SOLUTION
4.5000 g | Freq: Three times a day (TID) | INTRAVENOUS | Status: DC
Start: 2023-12-06 — End: 2023-12-08
  Administered 2023-12-06 (×2): 0 g via INTRAVENOUS
  Administered 2023-12-06 (×2): 4.5 g via INTRAVENOUS
  Administered 2023-12-07 (×2): 0 g via INTRAVENOUS
  Administered 2023-12-07 (×3): 4.5 g via INTRAVENOUS
  Administered 2023-12-07 – 2023-12-08 (×2): 0 g via INTRAVENOUS
  Administered 2023-12-08 (×2): 4.5 g via INTRAVENOUS
  Administered 2023-12-08: 0 g via INTRAVENOUS
  Filled 2023-12-06 (×8): qty 20

## 2023-12-06 MED ORDER — MORPHINE 4 MG/ML INJECTION WRAPPER
4.0000 mg | INJECTION | INTRAMUSCULAR | Status: DC | PRN
Start: 2023-12-06 — End: 2023-12-06

## 2023-12-06 MED ORDER — MORPHINE 4 MG/ML INJECTION WRAPPER
4.0000 mg | INJECTION | INTRAMUSCULAR | Status: DC | PRN
Start: 2023-12-06 — End: 2023-12-06
  Administered 2023-12-06: 4 mg via INTRAVENOUS
  Filled 2023-12-06: qty 1

## 2023-12-06 MED ORDER — DEXTROSE 50 % IN WATER (D50W) INTRAVENOUS SYRINGE
12.5000 g | INJECTION | INTRAVENOUS | Status: DC | PRN
Start: 2023-12-06 — End: 2023-12-11

## 2023-12-06 MED ORDER — NITROGLYCERIN 0.4 MG SUBLINGUAL TABLET
0.4000 mg | SUBLINGUAL_TABLET | SUBLINGUAL | Status: DC | PRN
Start: 2023-12-06 — End: 2023-12-11

## 2023-12-06 MED ORDER — INSULIN LISPRO 100 UNIT/ML SUB-Q - CHARGE BY DOSE
4.0000 [IU] | Freq: Once | SUBCUTANEOUS | Status: AC
Start: 2023-12-06 — End: 2023-12-06
  Administered 2023-12-06: 4 [IU] via SUBCUTANEOUS
  Filled 2023-12-06: qty 4

## 2023-12-06 MED ORDER — ACETAMINOPHEN 1,000 MG/100 ML (10 MG/ML) INTRAVENOUS SOLUTION
1000.0000 mg | Freq: Four times a day (QID) | INTRAVENOUS | Status: AC | PRN
Start: 2023-12-06 — End: 2023-12-07

## 2023-12-06 MED ORDER — DEXTROSE 40 % ORAL GEL
15.0000 g | ORAL | Status: DC | PRN
Start: 2023-12-06 — End: 2023-12-11

## 2023-12-06 MED ORDER — GABAPENTIN 400 MG CAPSULE
400.0000 mg | ORAL_CAPSULE | Freq: Every morning | ORAL | Status: DC
Start: 2023-12-06 — End: 2023-12-11
  Administered 2023-12-06 – 2023-12-11 (×6): 400 mg via ORAL
  Filled 2023-12-06 (×6): qty 1

## 2023-12-06 NOTE — Care Plan (Signed)
 Problem: Adult Inpatient Plan of Care  Goal: Absence of Hospital-Acquired Illness or Injury  Outcome: Ongoing (see interventions/notes)  Intervention: Identify and Manage Fall Risk  Recent Flowsheet Documentation  Taken 12/06/2023 2300 by Dorlene NOVAK, LPN  Safety Promotion/Fall Prevention:   activity supervised   nonskid shoes/slippers when out of bed   fall prevention program maintained   safety round/check completed  Intervention: Prevent Skin Injury  Recent Flowsheet Documentation  Taken 12/06/2023 2300 by Dorlene B, LPN  Skin Protection: adhesive use limited  Intervention: Prevent and Manage VTE (Venous Thromboembolism) Risk  Recent Flowsheet Documentation  Taken 12/06/2023 2300 by Dorlene B, LPN  VTE Prevention/Management: ambulation promoted  Goal: Optimal Comfort and Wellbeing  Outcome: Ongoing (see interventions/notes)  Goal: Rounds/Family Conference  Outcome: Ongoing (see interventions/notes)     Problem: Health Knowledge, Opportunity to Enhance (Adult,Obstetrics,Pediatric)  Goal: Knowledgeable about Health Subject/Topic  Description: Patient will demonstrate the desired outcomes by discharge/transition of care.  Outcome: Ongoing (see interventions/notes)     Problem: Wound  Goal: Optimal Coping  Outcome: Ongoing (see interventions/notes)  Goal: Optimal Functional Ability  Outcome: Ongoing (see interventions/notes)  Goal: Absence of Infection Signs and Symptoms  Outcome: Ongoing (see interventions/notes)  Intervention: Prevent or Manage Infection  Recent Flowsheet Documentation  Taken 12/06/2023 2300 by Huyen Perazzo B, LPN  Fever Reduction/Comfort Measures:   lightweight bedding   lightweight clothing  Goal: Improved Oral Intake  Outcome: Ongoing (see interventions/notes)  Goal: Optimal Pain Control and Function  Outcome: Ongoing (see interventions/notes)  Goal: Skin Health and Integrity  Outcome: Ongoing (see interventions/notes)  Intervention: Optimize Skin Protection  Recent Flowsheet Documentation  Taken  12/06/2023 2300 by Dorlene B, LPN  Pressure Reduction Techniques:   Moisture, shear and nutrition are maximized   Frequent weight shifting encouraged  Pressure Reduction Devices: Repositioning wedges/pillows utilized  Goal: Optimal Wound Healing  Outcome: Ongoing (see interventions/notes)  Intervention: Promote Wound Healing  Recent Flowsheet Documentation  Taken 12/06/2023 2300 by Dorlene B, LPN  Pressure Reduction Techniques:   Moisture, shear and nutrition are maximized   Frequent weight shifting encouraged  Pressure Reduction Devices: Repositioning wedges/pillows utilized     Problem: Skin Injury Risk Increased  Goal: Skin Health and Integrity  Outcome: Ongoing (see interventions/notes)  Intervention: Optimize Skin Protection  Recent Flowsheet Documentation  Taken 12/06/2023 2300 by Dorlene B, LPN  Pressure Reduction Techniques:   Moisture, shear and nutrition are maximized   Frequent weight shifting encouraged  Pressure Reduction Devices: Repositioning wedges/pillows utilized  Skin Protection: adhesive use limited

## 2023-12-06 NOTE — Nurses Notes (Signed)
 Pt asked for snacks, checked blood sugar- 405. Notified hospitalist Mace. Told us  to give the 18 units sliding scale. Told patient dinner was coming soon to which she replied  I am starving and Im gonna order groceries if you guys don't feed me. Educated patient on blood sugar and food options. Pt verbalized understanding but says this is a normal sugar for her. Joya Bosworth, LPN

## 2023-12-06 NOTE — Consults (Signed)
 [facility] Consult    Yvette Schmidt, Yvette Schmidt, 48 y.o. female  Date of Admission:  12/05/2023  Date of Birth:  April 29, 1975    Consulting Physician:  Dr. Rod  Reason for Consult: Medical Management    ASSESSMENT:  Active Hospital Problems    Diagnosis    Primary Problem: Gas gangrene of foot (CMS [facility])     PLAN:  Right foot osteomyelitis:  Abscess  S/p surgical intervention   S/p Dalvance  on 11/09 at 2:30 a.m.  Start Zosyn  pending infectious workup  Check duplex to rule out deep vein thrombosis  Follow up infectious workup   ID consulted by vascular  Optimize analgesia   Optimize bowel regimen   Encourage airway clearance   Management per vascular    Intermittent atypical chest pains: No recent or active pain reported  EKG without acute findings  Will c/w ASA and statin  Will s/w Nitro prn  Caridac tele monitoring   Will check lipid panel, A1c and TSH  Given presence of significant risk factors would likely benefit from a stress test when stable.    History of diastolic dysfunction:  Appears euvolemic  Optimize home meds   Avoid volume overload    DM:   Will s/w SSI and monitor accu checks  Will hold off on OHA meds    Asthma/Chronic Obstructive Pulmonary Disease: No wheezing   Continue nebs   Monitor respiratory status    Hypertension:   Optimize home meds    HPI: Yvette Schmidt is a 48 y.o., female who is being managed under vascular surgery service following an admission for right foot gas gangrene for which she underwent surgical intervention.  Hospitalist team was consulted for medical management.  Patient was seen postoperatively and appears well.  She currently reports some pain in the right foot surgical site as well as feeling hot.  Denies any other active complaints.  Patient reports that she has had the wound in her right foot that has progressively gotten worse over the past 2 months.  She was evaluated in the ED on the 8th and was found to have osteomyelitis.  She was advised  admission of the time but had left AMA.  She did receive a dose of Dalvance  at that time.  He has been on multiple courses of antibiotics.  Patient reports a chronic runny nose, cough and dyspnea that she attributes to allergies and Chronic Obstructive Pulmonary Disease/asthma that has been stable.  She reports chronic intermittent chest pain that she attributes to costochondritis that has been unchanged.  Denies any new fever, or chills.  Denies any nausea, vomiting, abdominal pain, diarrhea, constipation or LUTS.  Plan for medical optimization as discussed above.    Past Medical History:   Diagnosis Date    Asthma     COPD (chronic obstructive pulmonary disease)     Diabetes mellitus, type 2     Gastroparesis     GERD (gastroesophageal reflux disease)     Hypertension     MRSA (methicillin resistant staph aureus) culture positive 10/10/2022    Right buttock wound         Past Surgical History:   Procedure Laterality Date    HX HYSTERECTOMY  2012    Left ooperectomy, R. ovary retained    HX LAP CHOLECYSTECTOMY  05/12/2017    PB COLONOSCOPY,DIAGNOSTIC           Medications Prior to Admission       Prescriptions  ACCU-CHEK GUIDE GLUCOSE METER Does not apply Misc    Use three times daily with lancets and test strips    albuterol  sulfate (PROVENTIL ) 2.5 mg /3 mL (0.083 %) Inhalation nebulizer solution    Inhale 3 mL (2.5 mg total) Via Nebulizer Every 6 hours as needed for Wheezing    albuterol  sulfate (VENTOLIN  HFA) 90 mcg/actuation Inhalation oral inhaler    INHALE 1-2 PUFFS BY INHALATION EVERY 6 HOURS AS NEEDED    amitriptyline  (ELAVIL ) 50 mg Oral Tablet    Take 1 Tablet (50 mg total) by mouth Every night for 90 days    aspirin  81 mg Oral Tablet, Chewable    Chew 1 Tablet (81 mg total) Daily    azelastine  (ASTELIN ) 137 mcg (0.1 %) Nasal Spray, Non-Aerosol    ADMINISTER 1 SPRAY INTO EACH NOSTRIL TWICE DAILY FOR 30 DAYS    betamethasone  valerate (VALISONE ) 0.1 % Ointment    Apply topically Twice daily    Blood Sugar  Diagnostic (ACCU-CHEK GUIDE TEST STRIPS) Does not apply Strip    USE 1 STRIP TO CHECK BLOOD SUGAR FOUR TIMES DAILY BEFORE MEALS AND AT BEDTIME    cetirizine  (ZYRTEC ) 10 mg Oral Tablet    Take 1 Tablet (10 mg total) by mouth Daily for 90 days    ciprofloxacin  HCl (CIPRO ) 750 mg Oral Tablet    Take 1 Tablet (750 mg total) by mouth Twice daily for 10 days    clonazePAM  (KLONOPIN ) 0.5 mg Oral Tablet    Take 1 Tablet (0.5 mg total) by mouth Twice daily    EPINEPHrine  0.3 mg/0.3 mL Injection Auto-Injector    Inject 0.3 mL (0.3 mg total) into the muscle Once, as needed for up to 2 doses    fluticasone  propionate (FLONASE ) 50 mcg/actuation Nasal Spray, Suspension    Administer 2 Sprays into each nostril Twice daily    furosemide  (LASIX ) 40 mg Oral Tablet    Take 1 Tablet (40 mg total) by mouth Once a day    gabapentin  (NEURONTIN ) 800 mg Oral Tablet    Take 1/2 tablet by mouth in the morning and afternoon, then take 1 tablet in the evening.    Ibuprofen  (MOTRIN ) 200 mg Oral Tablet    Take 4 Tablets (800 mg total) by mouth Four times a day as needed for Pain    insulin  aspart U-100 (NOVOLOG ) 100 unit/mL (3 mL) Subcutaneous Insulin  Pen    Inject under the skin Twice a day before meals per Sliding Scale. <150 No injection, 150-200 2 units, 201-250 4 units, 251-300 6 units, 301-350 9 units, 351-400 12 units    Insulin  Needles, Disposable, (SURE COMFORT PEN NEEDLE) 32 gauge x 1/4 Needle    USE TO INJECT INSULIN  TWICE DAILY    ipratropium (ATROVENT ) 0.02 % Inhalation Solution    Take 1.25 mL (0.25 mg total) by nebulization Every 6 hours    Lancets (ACCU-CHEK SOFTCLIX LANCETS) Misc    Use to Check blood Sugar Three Times Daily    linaCLOtide  (LINZESS ) 290 mcg Oral Capsule    Take 1 Capsule (290 mcg total) by mouth Every morning    meloxicam  (MOBIC ) 15 mg Oral Tablet    TAKE 1 TABLET BY MOUTH EVERY DAY AT BEDTIME    metroNIDAZOLE  (FLAGYL ) 500 mg Oral Tablet    Take 1 Tablet (500 mg total) by mouth Three times a day for 10 days     mupirocin  (BACTROBAN ) 2 % Ointment    Apply topically Three times a  day for 30 days    Nebulizers Misc    Use with Nebulizer medication    pantoprazole  (PROTONIX ) 40 mg Oral Tablet, Delayed Release (E.C.)    Take 1 Tablet (40 mg total) by mouth Twice daily    polyethylene glycol (MIRALAX ) 17 gram Oral Powder in Packet    Take 1 Packet (17 g total) by mouth Once per day as needed for up to 30 days    rosuvastatin  (CRESTOR ) 20 mg Oral Tablet    Take 1 Tablet (20 mg total) by mouth Every evening    saccharomyces boulardii (FLORASTOR) capsule    SITagliptin  phosphate (JANUVIA ) 100 mg Oral Tablet    Take 1 Tablet (100 mg total) by mouth Once a day    SUMAtriptan  (IMITREX ) 50 mg Oral Tablet    TAKE 1 TABLET BY MOUTH ONCE DAILY AS NEEDED FOR MIGRAINES MAY REPEAT IN 2 HOURS IF NEEDED FOR 30 DAYS DO NOT EXCEED MORE THAN 9 DOSES IN A 30 DAY PERIOD    SYMBICORT  160-4.5 mcg/actuation Inhalation oral inhaler    TAKE 2 PUFFS BY INHALATION TWICE DAILY    vitamin B complex  Oral Tablet    Take 1 Tablet by mouth Daily          Allergies[1]  Social History     Tobacco Use    Smoking status: Every Day     Current packs/day: 0.50     Average packs/day: 0.5 packs/day for 15.0 years (7.5 ttl pk-yrs)     Types: Cigarettes    Smokeless tobacco: Never   Substance Use Topics    Alcohol use: Not Currently     Comment: seldom     Family Medical History:    None         ROS:  A comprehensive 10-point review of systems was obtained and is negative unless otherwise noted in history of present illness.    EXAM:  Constitutional:  No distress  Eyes:  Conjunctiva clear, Pupils equal and round.   Respiratory:  Clear to auscultation bilaterally, no wheezes.  Cardiovascular:  Regular rate and rhythm, murmur present  Gastrointestinal:  Soft, non-tender, Bowel sounds normal, non-distended.  Musculoskeletal:  Moves all 4 limbs  Integumentary:  Skin warm and dry  Extremities:  Right lower extremity with postop dressing and Ace wrap in place limiting  assessment, calf tenderness to right lower extremity, left lower extremity without any significant pitting edema or calf tenderness  Neurologic:  Awake, alert, moving all extremities, no significant new focal deficits reported     LABS:  Preop labs reviewed    Trinda Howell, MD  [facility] Hospitalist   12/06/2023 01:41           [1]   Allergies  Allergen Reactions    Beeswax      Allergic to Bee Stings    Clindamycin     Hydrocodone     Other      Nutrasweet, sugar subs    Splenda [Sucralose]      ALL SUGAR SUBSTITUTES      Sulfa (Sulfonamides)      HIVES    Theophylline      ANXIETY

## 2023-12-06 NOTE — Nurses Notes (Signed)
I have reviewed this patient's orders and plan of care.  Currently this patient meets requirements for a low to mid level of nursing care.  Ahmod Gillespie, RN

## 2023-12-06 NOTE — Nurses Notes (Signed)
 Notified Dr. Debby that patient blood glucose is 419. Orders to give 20units of insulin  lispro. Insulin  given per order.   Rosina Michaels, RN

## 2023-12-06 NOTE — Care Management Notes (Signed)
 [facility]  Care Management Initial Evaluation    Patient Name: Yvette Schmidt  Date of Birth: Sep 10, 1975  Sex: female  Date/Time of Admission: 12/05/2023 11:52 AM  Room/Bed: 6117/A  Payor: [employer] MEDICAID / Plan: HEALTH PLAN MEDICAID / Product Type: Medicaid MC /   Primary Care Providers:  Avelino Ripley Pop, MD, MD (General)    Pharmacy Info:   Preferred Pharmacy       [facility] [place] [place] - [address]    [address] [place] [address]    Phone: [phone] Fax: [phone]    Hours: Not open 24 hours    [facility]    [address] [place] [address]    Phone: [phone] Fax: [phone]    Hours: Monday-Friday 8AM-6PM, Saturday & "Sunday Closed    [facility]    [address] [place] [address]    Phone: [phone] Fax: [phone]    Hours: Monday-Friday 7AM-7PM, Saturday 10AM-3PM, Closed Sunday          Emergency Contact Info:   Extended Emergency Contact Information  Primary Emergency Contact: Markman,JOSPEH  Address: [address]           [place], [place] [address] [place]  Home Phone: [phone]  Work Phone: [phone]  Mobile Phone: [phone]  Relation: Husband  Secondary Emergency Contact: POSTLETHWAIT,HELON  Address: [address]           [place], [place] [address] [place]  Home Phone: [phone]  Work Phone: [phone]  Mobile Phone: [phone]  Relation: Mother    History:   Yvette Schmidt is a 48 y.o., female, admitted for gangrene.     Height/Weight: 165.1 cm (5' 5) / 133 kg (293 lb 14 oz)     LOS: 1 day   Admitting Diagnosis: Gangrene (CMS HCC) [I96]  Gas gangrene of foot (CMS HCC) [A48.0]    Assessment:      11" /12/25 1244   Assessment Details   Assessment Type Admission   Date of Care Management Update 12/06/23   Insurance Information/Type   Insurance type Medicaid   Employment/Financial   Patient has Prescription Coverage?  Yes        Name of Insurance  Coverage for Medications Health Plan Medicaid   Financial/Environmental Concerns none   Living Environment   Select an age group to open lives with row.  Adult   Lives With spouse   Living Arrangements house   Able to Return to Prior Arrangements yes   Living Arrangement Comments Patient lives at home with her husband; home is 1 level with a ramp to enter.   Home Safety   Home Accessibility no concerns   Home Safety Comments Patient denies any concerns.   Care Management Plan   Discharge Planning Status initial meeting   Discharge plan discussed with: Patient   CM will evaluate for rehabilitation potential yes   Patient aware of possible cost for ambulance transport?  Yes   Discharge Needs Assessment   Equipment Currently Used at Home walker, front wheeled;shower chair   Discharge Facility/Level of Care Needs Home vs Home with Home Health   Transportation Available family or friend will provide;car   Referral Information   Admission Type inpatient   Mutuality/Individual Preferences    Patient-Specific Goals (Include Timeframe) Patient anticpates returning home at time of discharge.         Discharge Plan:  Home  vs home with Home Health  SW met with patient at bedside for initial assessment.  Patient lives at home with her husband; home is 1 level living with a ramp to enter.  Patient sister assist with transportation as needed.  Current mobility DME for home use; FWW and shower chair.  No home O2.  No home health.  PCP is Dr. Ripley Puller.  Preferred pharmacy is [facility].  Patient is able to afford her current medications.  Primary insurance is Health Plan Medicaid.  Patient address and phone number confirmed.  Patient reports that home has all basic necessities and that she feels safe at home.  No MPOA on file; information offered and declined.  Patient anticipates returning home with wound care services; patient previously seen in the outpatient wound care clinic with DR. Dorlene.       SW offered patient time to voice any needs or questions; none noted at this time.     The patient will continue to be evaluated for developing discharge needs.     Case Manager: Jonna Hoyle, MSW,LGSW  Phone: [phone]

## 2023-12-06 NOTE — Consults (Addendum)
 Please see Dolphus consult note for today. Pt is well known to me from her husbands recent hospitalization.  She is upset regarding her diabetic diet. I have extensively explained to her that in order for her wound to heal, her glucose needs to be under control.  She is refusing diabetic diet.    We have agreed to the following:    She understands that being on a regular diet, her wound may not heal as well as it would if she was on a diabetic diet.  She agrees to regular finger sticks of her glucose and administration of insulin .  I have explained that the insulin  regimen that we will use in the hospital may not match her home regimen and that she needs to be compliant with what we recommend.  She understands that today is my last day on service and I will NOT be available during my time off.  My group will provide coverage. She also understands that she may be seen by the hospitalist group APP.    Adjusted gabapentin  dose to match her home dose. Increased SSI to resistant order set. Added Lantus  20 units daily. Will likely need further titration.    Rydell Wiegel Lane Khris Jansson II, MD    This note may have been partially generated using MModal Fluency Direct system, and there may be some incorrect words, spellings, and punctuation that were not noted in checking the note before saving.

## 2023-12-06 NOTE — Nurses Notes (Signed)
 Pt alert and oriented. Arrived to unit this shift. Blood sugars monitored and covered per providers orders. IV clean dry and intact. IV antibiotics tolerated. Pt had complaints of pain, given prn pain medication. Joya Bosworth, LPN

## 2023-12-06 NOTE — Consults (Signed)
 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 Adeniyi on 12/05/2023.    OR cultures pending.  Wound culture from 11/29/2023 multiple Gram-positive and negative organisms, not speciated.  Patient given 1 time dose IV Dalvance  on 12/02/2023 [facility] emergency department.    Continue IV Zosyn  while awaiting final culture results.  Orders initially placed for IV vancomycin  and pharmacy consult however since she received IV dalbavancin 11 8 no indication for additional MRSA coverage.    Postop wound care per Dr. Adeniyi.  Case discussed directly with primary service regarding patient's request for regular diet.  Case also discussed with nursing staff requested patient to be moved closer to her husband who is hospitalized at [facility] on another floor.              HISTORY OF PRESENT ILLNESS:  Yvette Schmidt is a 48 y.o. female who was admitted to [facility] secondary to ongoing right diabetic foot infection with gas gangrene.  Case reviewed with Dr. Adeniyi this morning who performed surgery yesterday.  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  She was seen at [facility] emergency department on 11/08, declined admission, given dose of Dalvance .  This morning the patient is very tearful and upset.  She is now that she is not getting a regular diet she has not do a diabetic diet at home.  She also is concerned about her husband who was hospitalized on another floor in the hospital and would like to be moved.  She also wants something for pain control otherwise  plan to signed out AMA  She has been started on IV Zosyn , tolerating antibiotics without adverse reactions.          Past medical history, surgical history, social history and family history reviewed in Epic, along with personal review of HPI.     Past Medical History:   Diagnosis Date    Asthma     COPD (chronic obstructive pulmonary disease)     Diabetes mellitus, type 2     Gastroparesis     GERD (gastroesophageal reflux disease)     Hypertension     MRSA (methicillin resistant staph aureus) culture positive 10/10/2022    Right buttock wound           Past Surgical History:   Procedure Laterality Date    HX HYSTERECTOMY  2012    Left ooperectomy, R. ovary retained    HX LAP CHOLECYSTECTOMY  05/12/2017    PB COLONOSCOPY,DIAGNOSTIC             Family Medical History:    None           Social History[1]    Allergies[2]    Outpatient Medications Marked as Taking for the 12/05/23 encounter [facility] Encounter)   Medication Sig    albuterol  sulfate (PROVENTIL ) 2.5 mg /3 mL (0.083 %) Inhalation nebulizer solution Inhale 3 mL (2.5 mg total) Via Nebulizer Every 6 hours as needed for Wheezing    albuterol  sulfate (VENTOLIN  HFA) 90 mcg/actuation  Inhalation oral inhaler INHALE 1-2 PUFFS BY INHALATION EVERY 6 HOURS AS NEEDED    amitriptyline  (ELAVIL ) 50 mg Oral Tablet Take 1 Tablet (50 mg total) by mouth Every night for 90 days    aspirin  81 mg Oral Tablet, Chewable Chew 1 Tablet (81 mg total) Daily    azelastine  (ASTELIN ) 137 mcg (0.1 %) Nasal Spray, Non-Aerosol ADMINISTER 1 SPRAY INTO EACH NOSTRIL TWICE DAILY FOR 30 DAYS    [EXPIRED] cetirizine  (ZYRTEC ) 10 mg Oral Tablet Take 1 Tablet (10 mg total) by mouth Daily for 90 days    ciprofloxacin  HCl (CIPRO ) 750 mg Oral Tablet Take 1 Tablet (750 mg total) by mouth Twice daily for 10 days    clonazePAM  (KLONOPIN ) 0.5 mg Oral Tablet Take 1 Tablet (0.5 mg total) by mouth Twice daily    fluticasone  propionate (FLONASE ) 50 mcg/actuation Nasal Spray, Suspension Administer 2 Sprays  into each nostril Twice daily    furosemide  (LASIX ) 40 mg Oral Tablet Take 1 Tablet (40 mg total) by mouth Once a day    gabapentin  (NEURONTIN ) 800 mg Oral Tablet Take 1/2 tablet by mouth in the morning and afternoon, then take 1 tablet in the evening.    Ibuprofen  (MOTRIN ) 200 mg Oral Tablet Take 4 Tablets (800 mg total) by mouth Four times a day as needed for Pain    insulin  aspart U-100 (NOVOLOG ) 100 unit/mL (3 mL) Subcutaneous Insulin  Pen Inject under the skin Twice a day before meals per Sliding Scale. <150 No injection, 150-200 2 units, 201-250 4 units, 251-300 6 units, 301-350 9 units, 351-400 12 units    linaCLOtide  (LINZESS ) 290 mcg Oral Capsule Take 1 Capsule (290 mcg total) by mouth Every morning    meloxicam  (MOBIC ) 15 mg Oral Tablet TAKE 1 TABLET BY MOUTH EVERY DAY AT BEDTIME    metroNIDAZOLE  (FLAGYL ) 500 mg Oral Tablet Take 1 Tablet (500 mg total) by mouth Three times a day for 10 days    pantoprazole  (PROTONIX ) 40 mg Oral Tablet, Delayed Release (E.C.) Take 1 Tablet (40 mg total) by mouth Twice daily    rosuvastatin  (CRESTOR ) 20 mg Oral Tablet Take 1 Tablet (20 mg total) by mouth Every evening    SITagliptin  phosphate (JANUVIA ) 100 mg Oral Tablet Take 1 Tablet (100 mg total) by mouth Once a day    SYMBICORT  160-4.5 mcg/actuation Inhalation oral inhaler TAKE 2 PUFFS BY INHALATION TWICE DAILY    vitamin B complex  Oral Tablet Take 1 Tablet by mouth Daily       Problem List[3]    REVIEW OF SYSTEMS:     Remainder of review of systems negative unless noted above.     PHYSICAL EXAMINATION:  Filed Vitals:    12/05/23 2330 12/05/23 2345 12/06/23 0000 12/06/23 0735   BP: (!) 135/91 (!) 139/95 (!) 159/130 122/83   Pulse: 88 87 88 80   Resp: 15 17 (!) 22 18   Temp:    36.4 C (97.5 F)   SpO2: 93% 92%  91%         Intake/Output Summary (Last 24 hours) at 12/06/2023 1023  Last data filed at 12/06/2023 1000  Gross per 24 hour   Intake 107 ml   Output 770 ml   Net -663 ml           I have reviewed the  vitals.  Sitting up on the edge of bed awake alert, mad frustrated and tearful.  Right leg is dangling over the bed, Ace wrap is in  place, dressing is dry without any ascending erythema.        Labs:     Results for orders placed or performed during the hospital encounter of 12/05/23 (from the past 24 hours)   POC BLOOD GLUCOSE (RESULTS)    Collection Time: 12/05/23  1:19 PM   Result Value Ref Range    GLUCOSE, POC 348 (H) 70 - 110 mg/dl   TISSUE CULTURE (AEROBIC CULT & GRAM STAIN)    Collection Time: 12/05/23  9:42 PM    Specimen: Tissue   Result Value Ref Range    GRAM STAIN No Organisms Seen    POC BLOOD GLUCOSE (RESULTS)    Collection Time: 12/05/23 10:01 PM   Result Value Ref Range    GLUCOSE, POC 237 (H) 70 - 110 mg/dl   CBC    Collection Time: 12/05/23 11:48 PM   Result Value Ref Range    WBC 13.8 (H) 3.7 - 11.0 x103/uL    RBC 5.07 3.85 - 5.22 x106/uL    HGB 15.7 11.5 - 16.0 g/dL    HCT 53.5 (H) 65.1 - 46.0 %    MCV 91.5 78.0 - 100.0 fL    MCH 31.0 26.0 - 32.0 pg    MCHC 33.8 31.0 - 35.5 g/dL    RDW-CV 85.7 88.4 - 84.4 %    PLATELETS 311 150 - 400 x103/uL    MPV 9.8 8.7 - 12.5 fL   BASIC METABOLIC PANEL    Collection Time: 12/05/23 11:48 PM   Result Value Ref Range    SODIUM 134 (L) 136 - 145 mmol/L    POTASSIUM 4.5 3.5 - 5.1 mmol/L    CHLORIDE 99 96 - 111 mmol/L    CO2 TOTAL 26 22 - 30 mmol/L    ANION GAP 9 4 - 13 mmol/L    CALCIUM 8.8 8.6 - 10.2 mg/dL    GLUCOSE 711 (H) 65 - 125 mg/dL    BUN 7 (L) 8 - 25 mg/dL    CREATININE 9.37 9.39 - 1.05 mg/dL    eGFRcr - FEMALE >09 >=60 mL/min/1.62m2    BUN/CREA RATIO 11 6 - 22   LIPID PANEL    Collection Time: 12/05/23 11:48 PM   Result Value Ref Range    CHOLESTEROL  87 (L) 100 - 200 mg/dL    HDL CHOL 18 (L) >=49 mg/dL    TRIGLYCERIDES 864 <849 mg/dL    LDL CALC 45 <899 mg/dL    VLDL CALC 19 <69 mg/dL    NON-HDL 69 <=809 mg/dL    CHOL/HDL RATIO 4.8    HGA1C (HEMOGLOBIN A1C WITH EST AVG GLUCOSE)    Collection Time: 12/05/23 11:48 PM   Result Value Ref Range     HEMOGLOBIN A1C 11.6 (H) 4.1 - 5.7 %    ESTIMATED AVERAGE GLUCOSE 286 mg/dL   THYROID  STIMULATING HORMONE WITH FREE T4 REFLEX    Collection Time: 12/05/23 11:48 PM   Result Value Ref Range    TSH 1.627 0.350 - 4.940 uIU/mL   COMPREHENSIVE METABOLIC PANEL, NON-FASTING    Collection Time: 12/06/23  6:23 AM   Result Value Ref Range    SODIUM 132 (L) 136 - 145 mmol/L    POTASSIUM 4.8 3.5 - 5.1 mmol/L    CHLORIDE 96 96 - 111 mmol/L    CO2 TOTAL 26 22 - 30 mmol/L    ANION GAP 10 4 - 13 mmol/L    BUN 10 8 - 25 mg/dL  CREATININE 0.74 0.60 - 1.05 mg/dL    BUN/CREA RATIO 14 6 - 22    eGFRcr - FEMALE >90 >=60 mL/min/1.73m2    ALBUMIN 2.7 (L) 3.5 - 5.0 g/dL    CALCIUM 9.1 8.6 - 89.7 mg/dL    GLUCOSE 521 (HH) 65 - 125 mg/dL    ALKALINE PHOSPHATASE 143 (H) 40 - 110 U/L    ALT (SGPT) 12 <31 U/L    AST (SGOT)  22 11 - 34 U/L    BILIRUBIN TOTAL 0.3 0.3 - 1.3 mg/dL    PROTEIN TOTAL 7.2 6.4 - 8.3 g/dL   POC BLOOD GLUCOSE (RESULTS)    Collection Time: 12/06/23  7:33 AM   Result Value Ref Range    GLUCOSE, POC 404 (H) 70 - 110 mg/dl       I have reviewed labs.    Current Medications[4]    Micro:   Hospital Encounter on 12/05/23 (from the past 96 hours)   TISSUE CULTURE (AEROBIC CULT & GRAM STAIN)    Collection Time: 12/05/23  9:42 PM    Specimen: Tissue   Culture Result Status    GRAM STAIN No Organisms Seen Preliminary       Radiology:              Dorn Bars, DO  [facility] Infectious Diseases             [1]   Social History  Tobacco Use    Smoking status: Every Day     Current packs/day: 0.50     Average packs/day: 0.5 packs/day for 15.0 years (7.5 ttl pk-yrs)     Types: Cigarettes    Smokeless tobacco: Never   Substance Use Topics    Alcohol use: Not Currently     Comment: seldom    Drug use: Not Currently     Types: Marijuana     Comment: occasionally   [2]   Allergies  Allergen Reactions    Beeswax      Allergic to Bee Stings    Clindamycin     Hydrocodone     Other      Nutrasweet, sugar subs    Splenda [Sucralose]      ALL  SUGAR SUBSTITUTES      Sulfa (Sulfonamides)      HIVES    Theophylline      ANXIETY   [3]   Patient Active Problem List  Diagnosis    Asthma    Type 2 diabetes mellitus with peripheral neuropathy (CMS HCC)    Sleep Apnea    Depression    GERD    Headache    Migraine Headaches    Stress test    2-D echo    Abscess    Open wound    Insomnia    GAD (generalized anxiety disorder)    Rash and other nonspecific skin eruption    Gas gangrene of foot (CMS HCC)    Osteomyelitis, unspecified site, unspecified type (CMS HCC)   [4]   Current Facility-Administered Medications:     acetaminophen  (OFIRMEV ) 1,000 mg (10 mg/mL) IV 100 mL (tot vol), 1,000 mg, Intravenous, Q6H PRN, Dolphus Newton, MD    albuterol  (PROVENTIL ) 2.5 mg / 3 mL (0.083%) neb solution, 2.5 mg, Nebulization, Q4H PRN, Adeniyi, John A, MD    amitriptyline  (ELAVIL ) tablet, 50 mg, Oral, NIGHTLY, Adeniyi, John A, MD    budesonide  (PULMICORT  RESPULES) 0.5 mg/2 mL nebulizer suspension, 0.5 mg, Nebulization, 2x/day, 0.5 mg at 12/06/23  9182 **AND** arformoterol  (BROVANA ) 15 mcg/2 mL nebulizer solution, 15 mcg, Nebulization, 2x/day, Adeniyi, John A, MD, 15 mcg at 12/06/23 9182    aspirin  chewable tablet 81 mg, 81 mg, Oral, Daily, Adeniyi, John A, MD, 81 mg at 12/06/23 9192    azelastine  (ASTELIN ) 0.1% nasal spray, 2 Spray, Each Nostril, 2x/day, Adeniyi, John A, MD, 2 Spray at 12/06/23 9041    B complex-vitamin C -folic acid  (NEPHRO-VITE) tablet, 1 Tablet, Oral, Daily, Adeniyi, John A, MD, 1 Tablet at 12/06/23 9041    cetirizine  (zyrTEC ) tablet, 10 mg, Oral, Daily, Adeniyi, John A, MD, 10 mg at 12/06/23 9192    clonazePAM  (klonoPIN ) tablet, 0.5 mg, Oral, 2x/day, Adeniyi, John A, MD, 0.5 mg at 12/06/23 0807    Correction/SSIP insulin  lispro 100 units/mL injection, 4-18 Units, Subcutaneous, 4x/day AC, Mace, Laco, MD, 18 Units at 12/06/23 0818    dextrose  (GLUTOSE) 40% oral gel, 15 g, Oral, Q15 Min PRN, Dolphus Newton, MD    dextrose  50% (0.5 g/mL) injection - syringe,  12.5 g, Intravenous, Q15 Min PRN, Dolphus Newton, MD    enoxaparin  PF (LOVENOX ) 40 mg/0.4 mL SubQ injection, 40 mg, Subcutaneous, Q24H, Adeniyi, John A, MD, 40 mg at 12/06/23 9040    fluticasone  (FLONASE ) 50 mcg per spray nasal spray, 2 Spray, Each Nostril, 2x/day, Adeniyi, John A, MD, 2 Spray at 12/06/23 0804    [Held by provider] furosemide  (LASIX ) tablet, 40 mg, Oral, Daily, Adeniyi, John A, MD    [START ON 12/07/2023] gabapentin  (NEURONTIN ) capsule, 800 mg, Oral, QPM, Mace, Laco, MD    gabapentin  (NEURONTIN ) capsule, 400 mg, Oral, QAM, Mace, Laco, MD, 400 mg at 12/06/23 0830    gabapentin  (NEURONTIN ) capsule, 400 mg, Oral, Daily with Lunch, Mace, Laco, MD    glucagon  (GLUCAGEN) injection 1 mg, 1 mg, IntraMUSCULAR, Once PRN, Kannan, Vignesh, MD    ibuprofen  (MOTRIN ) tablet, 800 mg, Oral, 4x/day PRN, Adeniyi, John A, MD    insulin  glargine 100 units/mL injection, 20 Units, Subcutaneous, Daily, Mace, Laco, MD, 20 Units at 12/06/23 1006    ipratropium (ATROVENT ) 0.02% nebulizer solution, 0.25 mg, Nebulization, Q6HRS, Adeniyi, John A, MD, 0.25 mg at 12/06/23 0818    linaclotide  (LINZESS ) capsule, 290 mcg, Oral, QAM, Adeniyi, John A, MD    meloxicam  (MOBIC ) tablet, 15 mg, Oral, NIGHTLY, Adeniyi, John A, MD, 15 mg at 12/06/23 0100    morphine  4 mg/mL injection, 4 mg, Intravenous, Q4H PRN, Dolphus Newton, MD    mupirocin  (BACTROBAN ) 2% topical ointment, , Apply Topically, 3x/day, Adeniyi, John A, MD    nicotine  (NICODERM CQ ) transdermal patch (mg/24 hr), 21 mg, Transdermal, Daily, Caywood, Jason, DO, 21 mg at 12/06/23 0808    nitroGLYCERIN  (NITROSTAT ) sublingual tablet, 0.4 mg, Sublingual, Q5 Min PRN, Dolphus Newton, MD    NS premix infusion, , Intravenous, Continuous, Dolphus Newton, MD, Last Rate: 100 mL/hr at 12/06/23 0752, New Bag at 12/06/23 0752    oxyCODONE  (ROXICODONE ) immediate release tablet, 5 mg, Oral, Q4H PRN, Kannan, Vignesh, MD, 5 mg at 12/06/23 9040    pantoprazole  (PROTONIX ) delayed release tablet,  40 mg, Oral, 2x/day, Adeniyi, John A, MD, 40 mg at 12/06/23 9192    piperacillin -tazobactam (ZOSYN ) 4.5 g in NS 100 mL IVPB with adaptor, 4.5 g, Intravenous, Q8H, Dolphus Newton, MD, Last Rate: 27 mL/hr at 12/06/23 0808, 4.5 g at 12/06/23 9191    polyethylene glycol (MIRALAX ) oral packet, 17 g, Oral, Daily PRN, Adeniyi, John A, MD    rosuvastatin  (CRESTOR ) tablet, 20 mg, Oral, QPM,  Adeniyi, John A, MD, 20 mg at 12/06/23 0038    [Held by provider] SAXagliptin  (ONGLYZA) tablet, 2.5 mg, Oral, Daily, Adeniyi, John A, MD    SUMAtriptan  (IMITREX ) tablet, 50 mg, Oral, Once PRN, Adeniyi, John A, MD    triamcinolone  acetonide 0.1% topical cream, , Apply Topically, 2x/day, Adeniyi, John A, MD    vancomycin  (VANCOCIN ) 1,250 mg in NS 250 mL IVPB, 15 mg/kg (Adjusted), Intravenous, Q12H, Taft Carrier, DO    Vancomycin  IV - Pharmacist to Dose per Protocol, , Does not apply, Daily PRN, Taft Carrier, DO

## 2023-12-06 NOTE — Nurses Notes (Signed)
 Patient transferred to [id number], report given to [facility]. All belongings transferred with patient.   Rosina Michaels, RN

## 2023-12-06 NOTE — Nurses Notes (Signed)
 [address]. Notified Dr. Debby of glucose on glucometer 404. Orders acknowledged and orders to give 18units of insulin  lispro.     Notified Lauraine Drivers, NP that critical glucose 478 on labs this morning. Glucose 404 on glucometer. 18units of insulin  lispro given per Dr. Debby order.   Rosina Michaels, RN

## 2023-12-07 ENCOUNTER — Inpatient Hospital Stay (HOSPITAL_COMMUNITY): Payer: MEDICAID

## 2023-12-07 DIAGNOSIS — I5032 Chronic diastolic (congestive) heart failure: Secondary | ICD-10-CM

## 2023-12-07 DIAGNOSIS — E1165 Type 2 diabetes mellitus with hyperglycemia: Secondary | ICD-10-CM

## 2023-12-07 DIAGNOSIS — Z794 Long term (current) use of insulin: Secondary | ICD-10-CM

## 2023-12-07 DIAGNOSIS — F17210 Nicotine dependence, cigarettes, uncomplicated: Secondary | ICD-10-CM

## 2023-12-07 DIAGNOSIS — J449 Chronic obstructive pulmonary disease, unspecified: Secondary | ICD-10-CM

## 2023-12-07 LAB — BASIC METABOLIC PANEL
ANION GAP: 7 mmol/L (ref 4–13)
ANION GAP: 6 mmol/L (ref 4–13)
BUN/CREA RATIO: 21 (ref 6–22)
BUN/CREA RATIO: 17 (ref 6–22)
BUN: 14 mg/dL (ref 8–25)
BUN: 12 mg/dL (ref 8–25)
CALCIUM: 8.8 mg/dL (ref 8.6–10.2)
CALCIUM: 8.6 mg/dL (ref 8.6–10.2)
CHLORIDE: 99 mmol/L (ref 96–111)
CHLORIDE: 98 mmol/L (ref 96–111)
CO2 TOTAL: 25 mmol/L (ref 22–30)
CO2 TOTAL: 29 mmol/L (ref 22–30)
CREATININE: 0.68 mg/dL (ref 0.60–1.05)
CREATININE: 0.71 mg/dL (ref 0.60–1.05)
GLUCOSE: 428 mg/dL (ref 65–125)
GLUCOSE: 432 mg/dL (ref 65–125)
POTASSIUM: 4.2 mmol/L (ref 3.5–5.1)
POTASSIUM: 4.7 mmol/L (ref 3.5–5.1)
SODIUM: 131 mmol/L — ABNORMAL LOW (ref 136–145)
SODIUM: 133 mmol/L — ABNORMAL LOW (ref 136–145)
eGFRcr - FEMALE: >90 mL/min/1.73mˆ2 (ref >=60)
eGFRcr - FEMALE: >90 mL/min/1.73mˆ2 (ref >=60)

## 2023-12-07 LAB — RENAL FUNCTION PANEL
ALBUMIN: 2.7 g/dL — ABNORMAL LOW (ref 3.5–5.0)
ANION GAP: 8 mmol/L (ref 4–13)
BUN/CREA RATIO: 20 (ref 6–22)
BUN: 13 mg/dL (ref 8–25)
CALCIUM: 8.8 mg/dL (ref 8.6–10.2)
CHLORIDE: 99 mmol/L (ref 96–111)
CO2 TOTAL: 24 mmol/L (ref 22–30)
CREATININE: 0.66 mg/dL (ref 0.60–1.05)
GLUCOSE: 430 mg/dL (ref 65–125)
PHOSPHORUS: 3 mg/dL (ref 2.4–4.7)
POTASSIUM: 4.2 mmol/L (ref 3.5–5.1)
SODIUM: 131 mmol/L — ABNORMAL LOW (ref 136–145)
eGFRcr - FEMALE: >90 mL/min/1.73mˆ2 (ref >=60)

## 2023-12-07 LAB — POC BLOOD GLUCOSE (RESULTS)
GLUCOSE, POC: 454 mg/dL — ABNORMAL HIGH (ref 70–110)
GLUCOSE, POC: 367 mg/dL — ABNORMAL HIGH (ref 70–110)
GLUCOSE, POC: 387 mg/dL — ABNORMAL HIGH (ref 70–110)

## 2023-12-07 LAB — CBC
HCT: 44.2 % (ref 34.8–46.0)
HGB: 14.6 g/dL (ref 11.5–16.0)
MCH: 30.9 pg (ref 26.0–32.0)
MCHC: 33 g/dL (ref 31.0–35.5)
MCV: 93.4 fL (ref 78.0–100.0)
MPV: 9.9 fL (ref 8.7–12.5)
PLATELETS: 276 x10ˆ3/uL (ref 150–400)
RBC: 4.73 x10ˆ6/uL (ref 3.85–5.22)
RDW-CV: 14.1 % (ref 11.5–15.5)
WBC: 9.6 x10ˆ3/uL (ref 3.7–11.0)

## 2023-12-07 LAB — SURGICAL PATHOLOGY SPECIMEN

## 2023-12-07 LAB — PHOSPHORUS: PHOSPHORUS: 3 mg/dL (ref 2.4–4.7)

## 2023-12-07 LAB — MAGNESIUM: MAGNESIUM: 1.9 mg/dL (ref 1.8–2.6)

## 2023-12-07 MED ORDER — CLONAZEPAM 0.5 MG TABLET
0.5000 mg | ORAL_TABLET | Freq: Two times a day (BID) | ORAL | Status: DC
Start: 2023-12-07 — End: 2023-12-07
  Administered 2023-12-07: 0.5 mg via ORAL
  Filled 2023-12-07: qty 1

## 2023-12-07 MED ORDER — INSULIN LISPRO 100 UNIT/ML SUB-Q SSIP VIAL
0.0000 [IU] | INJECTION | Freq: Four times a day (QID) | SUBCUTANEOUS | Status: DC
Start: 2023-12-07 — End: 2023-12-11
  Administered 2023-12-07 – 2023-12-08 (×4): 20 [IU] via SUBCUTANEOUS
  Administered 2023-12-08: 12 [IU] via SUBCUTANEOUS
  Administered 2023-12-08: 16 [IU] via SUBCUTANEOUS
  Administered 2023-12-08: 5 [IU] via SUBCUTANEOUS
  Administered 2023-12-09 – 2023-12-11 (×10): 20 [IU] via SUBCUTANEOUS
  Filled 2023-12-07 (×10): qty 20
  Filled 2023-12-07: qty 16
  Filled 2023-12-07 (×5): qty 20
  Filled 2023-12-07: qty 12

## 2023-12-07 MED ORDER — NICOTINE (POLACRILEX) 2 MG GUM
2.0000 mg | CHEWING_GUM | BUCCAL | Status: DC | PRN
Start: 2023-12-07 — End: 2023-12-10
  Filled 2023-12-07: qty 1

## 2023-12-07 MED ORDER — INSULIN LISPRO 100 UNIT/ML SUB-Q - CHARGE BY DOSE
10.0000 [IU] | Freq: Three times a day (TID) | SUBCUTANEOUS | Status: DC
Start: 2023-12-07 — End: 2023-12-08
  Administered 2023-12-07 – 2023-12-08 (×3): 10 [IU] via SUBCUTANEOUS
  Filled 2023-12-07 (×3): qty 10

## 2023-12-07 MED ORDER — INSULIN GLARGINE 100 UNITS/ML SUBQ - CHARGE BY DOSE
30.0000 [IU] | Freq: Every day | SUBCUTANEOUS | Status: DC
Start: 2023-12-08 — End: 2023-12-08
  Administered 2023-12-08: 30 [IU] via SUBCUTANEOUS
  Filled 2023-12-07: qty 30

## 2023-12-07 MED ORDER — OXYCODONE 5 MG TABLET
5.0000 mg | ORAL_TABLET | ORAL | Status: DC | PRN
Start: 2023-12-07 — End: 2023-12-11
  Administered 2023-12-07 – 2023-12-11 (×12): 5 mg via ORAL
  Filled 2023-12-07 (×12): qty 1

## 2023-12-07 MED ORDER — CLONAZEPAM 0.5 MG TABLET
0.5000 mg | ORAL_TABLET | Freq: Three times a day (TID) | ORAL | Status: DC
Start: 2023-12-07 — End: 2023-12-11
  Administered 2023-12-07 – 2023-12-11 (×12): 0.5 mg via ORAL
  Filled 2023-12-07 (×12): qty 1

## 2023-12-07 MED ORDER — HYDROMORPHONE (PF) 0.5 MG/0.5 ML INJECTION SYRINGE
0.5000 mg | INJECTION | INTRAMUSCULAR | Status: DC | PRN
Start: 2023-12-07 — End: 2023-12-11
  Administered 2023-12-07 – 2023-12-11 (×13): 0.5 mg via INTRAVENOUS
  Filled 2023-12-07 (×13): qty 0.5

## 2023-12-07 MED ORDER — ACETAMINOPHEN 325 MG TABLET
650.0000 mg | ORAL_TABLET | ORAL | Status: DC | PRN
Start: 2023-12-07 — End: 2023-12-11

## 2023-12-07 NOTE — Nurses Notes (Signed)
 Pt  A&Ox4, and remained free from falls for the duration of the shift.    Pt was on RA and tolerated well. Complaints of pain combated with PRN medication. (SEE MAR).  New IV site remained clean, dry, and intact.    OR dressing remained clean, dry, and intact. ACE wrap loosened due to complaints of pain/swelling.     Pt had 2 critical POC BS readings, MD aware. Insulin  administer per MD order and MAR.     PT was on tele running NSR for the duration of the shift.       Vs remained stable throughout the shift. Pt free from complaints at this time and will continue to be monitored and reevaluated.   Call bell is within reach, questions or comments regarding plan of care encouraged.  BP (!) 96/55   Pulse 70   Temp 36.5 C (97.7 F)   Resp 18   Ht 1.651 m (5' 5)   Wt 133 kg (293 lb 14 oz)   SpO2 90%   BMI 48.90 kg/m       Dorlene Sauer, LPN

## 2023-12-07 NOTE — Care Plan (Signed)
 Problem: Adult Inpatient Plan of Care  Goal: Absence of Hospital-Acquired Illness or Injury  Outcome: Ongoing (see interventions/notes)  Intervention: Prevent Skin Injury  Recent Flowsheet Documentation  Taken 12/07/2023 2210 by Dorlene NOVAK, LPN  Skin Protection: adhesive use limited  Intervention: Prevent and Manage VTE (Venous Thromboembolism) Risk  Recent Flowsheet Documentation  Taken 12/07/2023 2210 by Dorlene B, LPN  VTE Prevention/Management: ambulation promoted  Goal: Optimal Comfort and Wellbeing  Outcome: Ongoing (see interventions/notes)  Goal: Rounds/Family Conference  Outcome: Ongoing (see interventions/notes)     Problem: Health Knowledge, Opportunity to Enhance (Adult,Obstetrics,Pediatric)  Goal: Knowledgeable about Health Subject/Topic  Description: Patient will demonstrate the desired outcomes by discharge/transition of care.  Outcome: Ongoing (see interventions/notes)     Problem: Wound  Goal: Optimal Coping  Outcome: Ongoing (see interventions/notes)  Goal: Optimal Functional Ability  Outcome: Ongoing (see interventions/notes)  Goal: Absence of Infection Signs and Symptoms  Outcome: Ongoing (see interventions/notes)  Goal: Improved Oral Intake  Outcome: Ongoing (see interventions/notes)  Goal: Optimal Pain Control and Function  Outcome: Ongoing (see interventions/notes)  Goal: Skin Health and Integrity  Outcome: Ongoing (see interventions/notes)  Intervention: Optimize Skin Protection  Recent Flowsheet Documentation  Taken 12/07/2023 2210 by Dorlene B, LPN  Pressure Reduction Techniques: Moisture, shear and nutrition are maximized  Pressure Reduction Devices: Repositioning wedges/pillows utilized  Goal: Optimal Wound Healing  Outcome: Ongoing (see interventions/notes)  Intervention: Promote Wound Healing  Recent Flowsheet Documentation  Taken 12/07/2023 2210 by Dorlene B, LPN  Pressure Reduction Techniques: Moisture, shear and nutrition are maximized  Pressure Reduction Devices: Repositioning  wedges/pillows utilized     Problem: Skin Injury Risk Increased  Goal: Skin Health and Integrity  Outcome: Ongoing (see interventions/notes)  Intervention: Optimize Skin Protection  Recent Flowsheet Documentation  Taken 12/07/2023 2210 by Dorlene B, LPN  Pressure Reduction Techniques: Moisture, shear and nutrition are maximized  Pressure Reduction Devices: Repositioning wedges/pillows utilized  Skin Protection: adhesive use limited

## 2023-12-07 NOTE — Care Plan (Signed)
 [facility]  Rehabilitation Services  Physical Therapy Initial Evaluation    Patient Name: Yvette Schmidt  Date of Birth: 28-Mar-1975  Height: Height: 165.1 cm (5' 5)  Weight: Weight: 133 kg (293 lb 14 oz)  Room/Bed: 4114/A  Payor: [employer]ICA[employer] / Plan: HE[employer] MEDICAID / Product Type: Medicaid [facility] /     Assessment:      Pt demonstrated deficits with endurance, balance, strength, ROM and mobility. Pt not eager to comply with orders set by surgeon. Pt reports will be walking so give me my boot. Pt was able to perform bed mobility, trasnfers and a few steps with ambulation. However, pt did so impulsively and not following direction/cues from therapist. Pt not agreeable to participate any further. Concern for participation in therapy/rehab. however pt is not able to transfer approprately in order to aide wound heeling. Therefore, Recommend SNF    Discharge Needs:    Equipment Recommendation: front wheeled walker         Discharge Disposition: skilled nursing facility    JUSTIFICATION OF DISCHARGE RECOMMENDATION   Based on current diagnosis, functional performance prior to admission, and current functional performance, this patient requires continued PT services in skilled nursing facility in order to achieve significant functional improvements in these deficit areas: aerobic capacity/endurance, gait, locomotion, and balance.        Plan:   Current Intervention: gait training, bed mobility training, balance training, home exercise program, neuromuscular re-education, motor coordination training, patient/family education, postural re-education, strengthening, stair training, ROM (range of motion), stretching, transfer training  To provide physical therapy services minimum of 5x/week  for duration of until goals are met.    The risks/benefits of therapy have been discussed with the patient/caregiver and he/she is in agreement with the established plan of care.       Subjective & Objective         12/07/23 1156   Therapist Pager   PT Assigned/ Phone # Asianae Minkler   Rehab Session   Document Type evaluation   PT Visit Date 12/07/23   Total PT Minutes: 23   Patient Effort poor   Symptoms Noted During/After Treatment none   General Information   Patient Profile Reviewed yes   Onset of Illness/Injury or Date of Surgery 12/05/23   Patient/Family/Caregiver Comments/Observations Pt reports being very upset. Pt wishes for this wrap to be taken off and give me my boot back Pt is aware she is NWB, But i'm going to walk on it.   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 Pt is currently NWB via orders. PMHx: COPD, DM. Per notes, pt is scheduled for a woudn vac today. but pt states I'm not getting one.   Medical Lines PIV Line   Respiratory Status room air   Existing Precautions/Restrictions fall precautions   Weight-bearing Status   Extremity Weight-bearing Status right lower extremity   Right Lower Extremity non weight-bearing (NWB)   Mutuality/Individual Preferences   Individualized Care Needs Assist x 1-2   Living Environment   Lives With spouse   Living Arrangements house   Home Assessment: No Problems Identified   Home Accessibility ramps present at home   Living Environment Comment Pt notes 2 level home and has a ramp. it also has hand rails.  Pt gets upset and say I'm not answering any more of your questions.    Functional Level Prior   Prior Functional Level Comment Pt  reports walking at home, however pt refused to answer pLOF   Self-Care   Usual Activity Tolerance good   Current Activity Tolerance fair   Equipment Currently Used at Home yes   Equipment Currently Used at Home walker, front wheeled   Pre Treatment Status   Pre Treatment Patient Status Patient supine in bed   Support Present Pre Treatment  None   Cognition   Behavior/Mood Observations behavior appropriate to situation, WNL/WFL   Orientation Status oriented x 4    Attention WNL/WFL   Follows Commands WFL   Comment Pt was oriented, however very disgruntled. She is upset with NWB status, not having post op shoe, and wrap being on. Pt complains that I'm not getting what I want (to walk), so I'm not giving you want you want   Vital Signs   O2 Delivery Post Treatment room air   Pain Assessment   Pretreatment Pain Rating 8/10   Posttreatment Pain Rating 8/10   Pain Location - Side Right   Pain Location - Orientation lower   Pain Location foot   RUE Assessment   RUE Assessment WFL for stated baseline   LUE Assessment   LUE Assessment WFL for stated baseline   RLE Assessment   RLE Assessment WFL for stated baseline  (no ROM R foot)   LLE Assessment   LLE Assessment WFL for stated baseline   Bed Mobility   Supine-Sit Independence stand-by assistance   Sit to Supine, Independence stand-by assistance   Bed Mobility, Assistive Device Head of Bed Elevated   Transfer Assessment/Treatment   Sit-Stand Independence contact guard assist  (Pt stood abruptly bearing weight through RLE. Pt not follow directions for safety or mobility)   Stand-Sit Independence contact guard assist   Sit-Stand-Sit, Assist Device None   Transfer Impairments balance impaired;endurance;strength decreased   Transfer Comment Pt impulsively stood and began taking steps. Pt is aware she isnto following WB status. I'm going to put weight through my foot. it's how I walk. Educated that PT can teach howto perform transfesr or slide transfers. Pt declined.   Gait Assessment/Treatment   Independence  contact guard assist   Assistive Device    (none)   Distance in Feet 5steps foward/ 5 steps backward   Deviations  cadence decreased;step length decreased;stride length decreased   Impairments  balance impaired;endurance;strength decreased   Comment Pt impulsively started ambulating without PT. Pt had unsteady gait, but PT not allowed to allow. Pt was bearing weight despite education. give me my boot. Educated on NWB. Pt  says. I know, but I am walking.   Balance   Sitting Balance: Static fair + balance   Sitting, Dynamic (Balance) fair balance   Sit-to-Stand Balance fair balance   Standing Balance: Static fair balance   Standing Balance: Dynamic fair balance   Post Treatment Status   Post Treatment Patient Status Call light within reach;Telephone within reach;Patient safety alarm activated;Patient supine in bed   Support Present Post Treatment  None   Plan of Care Review   Plan Of Care Reviewed With patient   Physical Therapy Clinical Impression   Assessment Pt demonstrated deficits with endurance, balance, strength, ROM and mobility. Pt not eager to comply with orders set by surgeon. Pt reports will be walking so give me my boot. Pt was able to perform bed mobility, trasnfers and a few steps with ambulation. However, pt did so impulsively and not following direction/cues from therapist. Pt not agreeable to participate any further. Concern for  participation in therapy/rehab. however pt is not able to transfer approprately in order to aide wound heeling. Therefore, Recommend SNF   Criteria for Skilled Therapeutic yes;meets criteria   Pathology/Pathophysiology Noted musculoskeletal;neuromuscular;cardiovascular;pulmonary   Impairments Found (describe specific impairments) aerobic capacity/endurance;gait, locomotion, and balance   Functional Limitations in Following  home management;self-care   Rehab Potential good   Therapy Frequency minimum of 5x/week   Predicted Duration of Therapy Intervention (days/wks) until goals are met   Anticipated Equipment Needs at Discharge (PT) front wheeled walker   Anticipated Discharge Disposition skilled nursing facility   Evaluation Complexity Justification   Patient History: Co-morbidity/factors that impact Plan of Care 3 or more that impact Plan of Care   Examination Components 3 or more Exam elements addressed   Presentation Stable: Uncomplicated, straight-forward, problem focused   Clinical  Decision Making Moderate complexity   Evaluation Complexity Moderate complexity   Care Plan Goals   PT Rehab Goals Bed Mobility Goal;Gait Training Goal;Stairs Training Goal;Transfer Training Goal   Bed Mobility Goal   Bed Mobility Goal, Date Established 12/07/23   Bed Mobility Goal, Time to Achieve 12 wks   Bed Mobility Goal, Activity Type all bed mobility activities   Bed Mobility Goal, Independence Level modified independence   Gait Training  Goal, Distance to Achieve   Gait Training  Goal, Date Established 12/07/23   Gait Training  Goal, Time to Achieve 30 days   Gait Training  Goal, Independence Level modified independence   Gait Training  Goal, Assist Device least restricted assistive device   Gait Training  Goal, Distance to Achieve 15   Stairs Training Goal   Stairs Training Goal, Date Established 12/07/23   Stairs Training Goal, Time to Achieve 30 days   Stairs Training Goal, Independence Level modified independence   Stairs Training Goal, Assist Device least restrictive assistive device   Stairs Training Goal, Number of Stairs to Achieve 4   Transfer Training Goal   Transfer Training Goal, Date Established 12/07/23   Transfer Training Goal, Time to Achieve 12 wks   Transfer Training Goal, Activity Type all transfers   Transfer Training Goal, Independence Level modified independence   Transfer Training Goal, Assist Device least restrictive assistive device   Planned Therapy Interventions, PT Eval   Planned Therapy Interventions (PT) gait training;bed mobility training;balance training;home exercise program;neuromuscular re-education;motor coordination training;patient/family education;postural re-education;strengthening;stair training;ROM (range of motion);stretching;transfer training       Therapist:   Damien Ada, PT 12/07/2023 12:38  Pager #: [phone]

## 2023-12-07 NOTE — Consults (Signed)
 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      Had no complaints    Physical exam :    Dressing appear clean dry and intact    Erythema and swelling of right leg improved    Tests Reviewed:     CBC with Diff (Last 48 Hours):    Recent Results in last 48 hours     12/05/23  2348 12/07/23  0846   WBC 13.8* 9.6   HGB 15.7 14.6   HCT 46.4* 44.2   MCV 91.5 93.4   PLTCNT 311 276     Preliminary culture gram-positive cocci and diphtheroids       ASSESSMENT:  Status post right foot open ray amputation drainage of abscess and debridement for gas gangrene    PLAN:   Negative pressure wound therapy tomorrow  Antibiotics per Infectious diseases    Lexie Morini A Amor Packard, MD  12/06/2023

## 2023-12-07 NOTE — Care Management Notes (Signed)
 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 Patient is going to have a wound vac placed today. Possible need for Physical Therapy eval also for potential discharge needs.    Discharge Plan:  Undetermined at this time    The patient will continue to be evaluated for developing discharge needs.     Case Manager: Charmaine Prima, RN  Phone: [phone]

## 2023-12-07 NOTE — Nurses Notes (Signed)
 Currently this patient meets requirements for a low to mid level of nursing care.  The patient will continue to be monitored and re-evaluated for any changes.  Thayer Headings, RN

## 2023-12-07 NOTE — Consults (Signed)
 [facility]  Hospitalist Progress Note    Yvette Schmidt       48 y.o.       Date of service: 12/07/2023  Date of Admission:  12/05/2023    Hospital Day:  LOS: 2 days   CC: Gas gangrene of foot (CMS HCC)    Subjective:  Patient seen and examined up in bed.  Patient is very anxious and tearful.  In-depth discussion had regarding diabetic diet and wound healing patient verbalizes understanding and ramifications of not following.  Patient also verbalizes that she understands that she is at high risk for limb loss.  Patient also endorses upper left and right quadrant pain and abdomen upon palpation firmness noted to right upper and left upper quadrants no tenderness noted bowel sounds intact.  Patient voided without difficulty with my assistance in room      Objective:   Filed Vitals:    12/07/23 0411 12/07/23 0721 12/07/23 0750 12/07/23 0801   BP: (!) 96/55  (!) 181/108 137/82   Pulse: 70  85    Resp: 18  20    Temp: 36.5 C (97.7 F)  36.6 C (97.9 F)    SpO2: 90% 95% 97%      Oxygen Device  SpO2: 97 %  Flow (L/min) (Oxygen Therapy): 2        I have reviewed the vitals.      Input/Output    Intake/Output Summary (Last 24 hours) at 12/07/2023 0941  Last data filed at 12/07/2023 0900  Gross per 24 hour   Intake 1284 ml   Output 620 ml   Net 664 ml         albuterol  (PROVENTIL ) 2.5 mg / 3 mL (0.083%) neb solution, 2.5 mg, Nebulization, Q4H PRN  amitriptyline  (ELAVIL ) tablet, 50 mg, Oral, NIGHTLY  budesonide  (PULMICORT  RESPULES) 0.5 mg/2 mL nebulizer suspension, 0.5 mg, Nebulization, 2x/day   And  arformoterol  (BROVANA ) 15 mcg/2 mL nebulizer solution, 15 mcg, Nebulization, 2x/day  aspirin  chewable tablet 81 mg, 81 mg, Oral, Daily  azelastine  (ASTELIN ) 0.1% nasal spray, 2 Spray, Each Nostril, 2x/day  B complex-vitamin C -folic acid  (NEPHRO-VITE) tablet, 1 Tablet, Oral, Daily  cetirizine  (zyrTEC ) tablet, 10 mg, Oral, Daily  clonazePAM  (klonoPIN ) tablet, 0.5 mg, Oral, 3x/day  Correction/SSIP insulin  lispro 100  units/mL injection, 0-20 Units, Subcutaneous, 4x/day AC  dextrose  (GLUTOSE) 40% oral gel, 15 g, Oral, Q15 Min PRN  dextrose  50% (0.5 g/mL) injection - syringe, 12.5 g, Intravenous, Q15 Min PRN  enoxaparin  PF (LOVENOX ) 40 mg/0.4 mL SubQ injection, 40 mg, Subcutaneous, Q24H  fluticasone  (FLONASE ) 50 mcg per spray nasal spray, 2 Spray, Each Nostril, 2x/day  [Held by provider] furosemide  (LASIX ) tablet, 40 mg, Oral, Daily  gabapentin  (NEURONTIN ) capsule, 800 mg, Oral, QPM  gabapentin  (NEURONTIN ) capsule, 400 mg, Oral, QAM  gabapentin  (NEURONTIN ) capsule, 400 mg, Oral, Daily with Lunch  glucagon  (GLUCAGEN) injection 1 mg, 1 mg, IntraMUSCULAR, Once PRN  ibuprofen  (MOTRIN ) tablet, 800 mg, Oral, 4x/day PRN  [START ON 12/08/2023] insulin  glargine 100 units/mL injection, 30 Units, Subcutaneous, Daily  insulin  lispro 100 units/mL injection, 10 Units, Subcutaneous, 3x/day AC  ipratropium (ATROVENT ) 0.02% nebulizer solution, 0.25 mg, Nebulization, Q6HRS  linaclotide  (LINZESS ) capsule, 290 mcg, Oral, QAM  meloxicam  (MOBIC ) tablet, 15 mg, Oral, NIGHTLY  morphine  4 mg/mL injection, 4 mg, Intravenous, Q4H PRN  mupirocin  (BACTROBAN ) 2% topical ointment, , Apply Topically, 3x/day  nicotine  (NICODERM CQ ) transdermal patch (mg/24 hr), 21 mg, Transdermal, Daily  nicotine  polacrilex (NICORETTE) chewing  gum, 2 mg, Oral, Q1H PRN  nitroGLYCERIN  (NITROSTAT ) sublingual tablet, 0.4 mg, Sublingual, Q5 Min PRN  oxyCODONE  (ROXICODONE ) immediate release tablet, 5 mg, Oral, Q4H PRN  pantoprazole  (PROTONIX ) delayed release tablet, 40 mg, Oral, 2x/day  phenol (CHLORASEPTIC) 1.4% oromucosal spray, 1 Spray, Mouth/Throat, Q4H PRN  piperacillin -tazobactam (ZOSYN ) 4.5 g in NS 100 mL IVPB with adaptor, 4.5 g, Intravenous, Q8H  polyethylene glycol (MIRALAX ) oral packet, 17 g, Oral, Daily PRN  rosuvastatin  (CRESTOR ) tablet, 20 mg, Oral, QPM  [Held by provider] SAXagliptin  (ONGLYZA) tablet, 2.5 mg, Oral, Daily  SUMAtriptan  (IMITREX ) tablet, 50 mg, Oral, Once  PRN  triamcinolone  acetonide 0.1% topical cream, , Apply Topically, 2x/day         Physical Exam:  General: No acute distress chronically ill morbidly obese  HEENT: Oropharynx is clear without lesions.  Cardiac: Regular rate and rhythm, no murmur auscultated.  Respiratory: Clear to auscultation bilaterally without wheeze.  Abdomen: Positive bowel sounds, soft, nontender  Extremities: No peripheral edema noted on exam, dressing intact on right leg    CBC (Last 24 Hours):    Recent Results last 24 hours     12/07/23  0846   WBC 9.6   HGB 14.6   HCT 44.2   MCV 93.4   PLTCNT 276         BMP (Last 24 Hours):    Recent Results last 24 hours     12/07/23  0846   SODIUM 131*   POTASSIUM 4.2   CHLORIDE 99   CO2 25   BUN 14   CREATININE 0.68   CALCIUM 8.8           I have reviewed all labs.    Micro:   Hospital Encounter on 12/05/23 (from the past 96 hours)   TISSUE CULTURE (AEROBIC CULT & GRAM STAIN)    Collection Time: 12/05/23  9:42 PM    Specimen: Tissue   Culture Result Status    GRAM STAIN No Organisms Seen Preliminary       Radiology:       PT/OT: Yes      Assessment/ Plan:   Active Hospital Problems    Diagnosis    Primary Problem: Gas gangrene of foot (CMS HCC)    Osteomyelitis, unspecified site, unspecified type (CMS HCC)     Right foot osteomyelitis:  Abscess  S/p surgical intervention open right 5th ray amputation with drainage of abscess washout and open packing 11/13  S/p Dalvance  on 11/09 at 2:30 a.m.  Antibiotics per infectious disease  Venous duplex negative for lower extremity deep vein thrombosis  Follow up infectious workup   ID consulted by vascular  Optimize analgesia   Optimize bowel regimen   Encourage airway clearance   Management per vascular     Intermittent atypical chest pains: No recent or active pain reported  EKG without acute findings  Will c/w ASA and statin  Will s/w Nitro prn  Caridac tele monitoring   Will check lipid panel, A1c and TSH  Given presence of significant risk factors would  likely benefit from a stress test when stable.     History of diastolic dysfunction:  Appears euvolemic  Optimize home meds   Avoid volume overload     Uncontrolled DM:   Continue titration of insulin  management Lantus  increased to 30 units sliding scale coverage also increase due to blood sugars remaining greater than 400  Refuses diabetic diet  Will hold off on OHA meds     Asthma/Chronic  Obstructive Pulmonary Disease: No wheezing   Continue nebs   Monitor respiratory status     Hypertension:   Optimize home meds    Abdominal pain  -CT        Lauraine Drivers, APRN, CNP

## 2023-12-07 NOTE — Consults (Signed)
 [facility] Consult    Yvette Schmidt, Yvette Schmidt, 48 y.o. 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 Medicine consulted to assist with management of comorbidities.      Past Medical History:   Diagnosis Date    Asthma     COPD (chronic obstructive pulmonary disease)     Diabetes mellitus, type 2     Gastroparesis     GERD (gastroesophageal reflux disease)     Hypertension     MRSA (methicillin resistant staph aureus) culture positive 10/10/2022    Right buttock wound         Past Surgical History:   Procedure Laterality Date    HX HYSTERECTOMY  2012    Left ooperectomy, R. ovary retained    HX LAP CHOLECYSTECTOMY  05/12/2017    PB COLONOSCOPY,DIAGNOSTIC           Medications Prior to Admission       Prescriptions    ACCU-CHEK GUIDE GLUCOSE METER Does not apply Misc    Use three times daily with lancets and test strips    albuterol  sulfate (PROVENTIL ) 2.5 mg /3 mL (0.083 %) Inhalation nebulizer solution    Inhale 3 mL (2.5 mg total) Via Nebulizer Every 6 hours as needed for Wheezing    albuterol  sulfate (VENTOLIN  HFA) 90 mcg/actuation Inhalation oral inhaler    INHALE 1-2 PUFFS BY INHALATION EVERY 6 HOURS AS NEEDED    amitriptyline  (ELAVIL ) 50 mg Oral Tablet    Take 1 Tablet (50 mg total) by mouth Every night for 90 days    aspirin  81 mg Oral Tablet, Chewable    Chew 1 Tablet (81 mg total) Daily    azelastine  (ASTELIN ) 137 mcg (0.1 %) Nasal Spray, Non-Aerosol    ADMINISTER 1 SPRAY INTO EACH NOSTRIL TWICE DAILY FOR 30 DAYS    betamethasone  valerate (VALISONE ) 0.1 % Ointment    Apply topically Twice daily    Blood Sugar Diagnostic (ACCU-CHEK GUIDE TEST STRIPS) Does not apply Strip    USE 1 STRIP TO CHECK BLOOD SUGAR FOUR TIMES DAILY BEFORE MEALS AND AT BEDTIME    cetirizine   (ZYRTEC ) 10 mg Oral Tablet    Take 1 Tablet (10 mg total) by mouth Daily for 90 days    ciprofloxacin  HCl (CIPRO ) 750 mg Oral Tablet    Take 1 Tablet (750 mg total) by mouth Twice daily for 10 days    clonazePAM  (KLONOPIN ) 0.5 mg Oral Tablet    Take 1 Tablet (0.5 mg total) by mouth Twice daily    EPINEPHrine  0.3 mg/0.3 mL Injection Auto-Injector    Inject 0.3 mL (0.3 mg total) into the muscle Once, as needed for up to 2 doses    fluticasone  propionate (FLONASE ) 50 mcg/actuation Nasal Spray, Suspension    Administer 2 Sprays into each nostril Twice daily    furosemide  (LASIX ) 40 mg Oral Tablet    Take 1 Tablet (40 mg total) by mouth Once a day    gabapentin  (NEURONTIN ) 800 mg Oral Tablet    Take 1/2 tablet by mouth in the morning and afternoon, then take 1 tablet in the evening.    Ibuprofen  (MOTRIN ) 200 mg Oral Tablet    Take 4  Tablets (800 mg total) by mouth Four times a day as needed for Pain    insulin  aspart U-100 (NOVOLOG ) 100 unit/mL (3 mL) Subcutaneous Insulin  Pen    Inject under the skin Twice a day before meals per Sliding Scale. <150 No injection, 150-200 2 units, 201-250 4 units, 251-300 6 units, 301-350 9 units, 351-400 12 units    Insulin  Needles, Disposable, (SURE COMFORT PEN NEEDLE) 32 gauge x 1/4 Needle    USE TO INJECT INSULIN  TWICE DAILY    ipratropium (ATROVENT ) 0.02 % Inhalation Solution    Take 1.25 mL (0.25 mg total) by nebulization Every 6 hours    Lancets (ACCU-CHEK SOFTCLIX LANCETS) Misc    Use to Check blood Sugar Three Times Daily    linaCLOtide  (LINZESS ) 290 mcg Oral Capsule    Take 1 Capsule (290 mcg total) by mouth Every morning    meloxicam  (MOBIC ) 15 mg Oral Tablet    TAKE 1 TABLET BY MOUTH EVERY DAY AT BEDTIME    metroNIDAZOLE  (FLAGYL ) 500 mg Oral Tablet    Take 1 Tablet (500 mg total) by mouth Three times a day for 10 days    mupirocin  (BACTROBAN ) 2 % Ointment    Apply topically Three times a day for 30 days    Nebulizers Misc    Use with Nebulizer medication    pantoprazole   (PROTONIX ) 40 mg Oral Tablet, Delayed Release (E.C.)    Take 1 Tablet (40 mg total) by mouth Twice daily    polyethylene glycol (MIRALAX ) 17 gram Oral Powder in Packet    Take 1 Packet (17 g total) by mouth Once per day as needed for up to 30 days    rosuvastatin  (CRESTOR ) 20 mg Oral Tablet    Take 1 Tablet (20 mg total) by mouth Every evening    saccharomyces boulardii (FLORASTOR) capsule    SITagliptin  phosphate (JANUVIA ) 100 mg Oral Tablet    Take 1 Tablet (100 mg total) by mouth Once a day    SUMAtriptan  (IMITREX ) 50 mg Oral Tablet    TAKE 1 TABLET BY MOUTH ONCE DAILY AS NEEDED FOR MIGRAINES MAY REPEAT IN 2 HOURS IF NEEDED FOR 30 DAYS DO NOT EXCEED MORE THAN 9 DOSES IN A 30 DAY PERIOD    SYMBICORT  160-4.5 mcg/actuation Inhalation oral inhaler    TAKE 2 PUFFS BY INHALATION TWICE DAILY    vitamin B complex  Oral Tablet    Take 1 Tablet by mouth Daily          Allergies[1]  Social History     Tobacco Use    Smoking status: Every Day     Current packs/day: 0.50     Average packs/day: 0.5 packs/day for 15.0 years (7.5 ttl pk-yrs)     Types: Cigarettes    Smokeless tobacco: Never   Substance Use Topics    Alcohol use: Not Currently     Comment: seldom     Family Medical History:    None             ROS:  A comprehensive 10-point review of systems was obtained and is negative unless otherwise noted in history of present illness.    Filed Vitals:    12/07/23 0411 12/07/23 0721 12/07/23 0750 12/07/23 0801   BP: (!) 96/55  (!) 181/108 137/82   Pulse: 70  85    Resp: 18  20    Temp: 36.5 C (97.7 F)  36.6 C (97.9 F)    SpO2: 90% 95%  97%      EXAM:  General: NAD, Awake, Alert  HEENT: MMM, EOMI  Neck: Supple, no LA  Respiratory: Clear to auscultation bilaterally, no wheeze or rales   Cardiac: RRR, s1s2, no m/g/r  Abdomen: soft, NTND, no organomegaly  Extremities: R foot with dressing in place  Neuro: No focal neurological deficits  Psych: calm  Skin: No eczema         ASSESSMENT and PLAN:     #Right foot OM/Abscess  S/p  5th ray amputation by vascular surgery  F/u Deep tissue cultures  Zosyn  4.5 q8h  Post-op wound care  If clean bone margins, likely no need for further antibiotics  Recommend f/u with ID    Pain management    #Uncontrolled Type II DM with Hyperglycemia  BG above target during admission, trending in 400-500's  A1c 11.6  Hold home oral diabetes medication  Glargine 30 units daily  Lispro 10 units tid ac (added today)  Correction scale insulin  tid ac  Fu microalbumin/cr ratio  Will need to be on basal bolus insulin  regimen on discharge    #COPD  Chronic, stable  No wheezing on exam  C/w Pulmicort  and Brovana   Albuterol  PRN    #Chronic Diastolic CHF  Euvolemic on exam  C/w home meds  Monitor volume status          Thank you for the consult   Chauncey Helena, MD         [1]   Allergies  Allergen Reactions    Beeswax      Allergic to Bee Stings    Clindamycin     Hydrocodone     Other      Nutrasweet, sugar subs    Splenda [Sucralose]      ALL SUGAR SUBSTITUTES      Sulfa (Sulfonamides)      HIVES    Theophylline      ANXIETY

## 2023-12-07 NOTE — Nurses Notes (Signed)
 Patient alert and oriented   Vitals stable this shift   Prn pain medication provided   Wound vac applied to wound, pictures and measurements on chart   Educated patient on importance of continuing to monitor blood glucose and properties to aid in healing wounds   Patient agreeable to insulin  coverage this shift   Critical blood glucose results sent to provider   Free of falls   NSR on telemetry monitor   Reviewed plan of care     BP 126/83   Pulse 80   Temp 36.4 C (97.5 F)   Resp 16   Ht 1.651 m (5' 5)   Wt 133 kg (293 lb 14 oz)   SpO2 90%   BMI 48.90 kg/m   John Dawes, LPN

## 2023-12-08 DIAGNOSIS — I519 Heart disease, unspecified: Secondary | ICD-10-CM

## 2023-12-08 DIAGNOSIS — Z792 Long term (current) use of antibiotics: Secondary | ICD-10-CM

## 2023-12-08 DIAGNOSIS — K760 Fatty (change of) liver, not elsewhere classified: Secondary | ICD-10-CM

## 2023-12-08 DIAGNOSIS — R0789 Other chest pain: Secondary | ICD-10-CM

## 2023-12-08 LAB — TISSUE CULTURE (AEROBIC CULT & GRAM STAIN): GRAM STAIN: NONE SEEN

## 2023-12-08 LAB — CBC
HCT: 42 % (ref 34.8–46.0)
HGB: 13.7 g/dL (ref 11.5–16.0)
MCH: 31 pg (ref 26.0–32.0)
MCHC: 32.6 g/dL (ref 31.0–35.5)
MCV: 95 fL (ref 78.0–100.0)
MPV: 10.6 fL (ref 8.7–12.5)
PLATELETS: 258 x10ˆ3/uL (ref 150–400)
RBC: 4.42 x10ˆ6/uL (ref 3.85–5.22)
RDW-CV: 14.1 % (ref 11.5–15.5)
WBC: 9.3 x10ˆ3/uL (ref 3.7–11.0)

## 2023-12-08 LAB — POC BLOOD GLUCOSE (RESULTS)
GLUCOSE, POC: 468 mg/dL — ABNORMAL HIGH (ref 70–110)
GLUCOSE, POC: 428 mg/dL — ABNORMAL HIGH (ref 70–110)
GLUCOSE, POC: 318 mg/dL — ABNORMAL HIGH (ref 70–110)
GLUCOSE, POC: 272 mg/dL — ABNORMAL HIGH (ref 70–110)

## 2023-12-08 LAB — MAGNESIUM: MAGNESIUM: 1.8 mg/dL (ref 1.8–2.6)

## 2023-12-08 LAB — ADULT ROUTINE BLOOD CULTURE, SET OF 2 BOTTLES (BACTERIA AND YEAST)
BLOOD CULTURE, ROUTINE: NO GROWTH
BLOOD CULTURE, ROUTINE: NO GROWTH

## 2023-12-08 LAB — CREATINE KINASE (CK), TOTAL, SERUM OR PLASMA: CREATINE KINASE: 18 U/L — ABNORMAL LOW (ref 25–190)

## 2023-12-08 MED ORDER — SODIUM CHLORIDE 0.9 % INTRAVENOUS SOLUTION
6.0000 mg/kg | INTRAVENOUS | Status: AC
Start: 2023-12-09 — End: 2023-12-12
  Administered 2023-12-09: 500 mg via INTRAVENOUS
  Administered 2023-12-09: 0 mg via INTRAVENOUS
  Administered 2023-12-10: 500 mg via INTRAVENOUS
  Administered 2023-12-10: 0 mg via INTRAVENOUS
  Administered 2023-12-11: 500 mg via INTRAVENOUS
  Administered 2023-12-11: 0 mg via INTRAVENOUS
  Filled 2023-12-08 (×3): qty 10

## 2023-12-08 MED ORDER — INSULIN LISPRO 100 UNIT/ML SUB-Q - CHARGE BY DOSE
15.0000 [IU] | Freq: Three times a day (TID) | SUBCUTANEOUS | Status: DC
Start: 2023-12-08 — End: 2023-12-11
  Administered 2023-12-08 – 2023-12-11 (×10): 15 [IU] via SUBCUTANEOUS
  Filled 2023-12-08 (×9): qty 15

## 2023-12-08 MED ORDER — GUAIFENESIN ER 600 MG TABLET, EXTENDED RELEASE 12 HR
600.0000 mg | EXTENDED_RELEASE_TABLET | Freq: Two times a day (BID) | ORAL | Status: DC | PRN
Start: 2023-12-08 — End: 2023-12-11
  Administered 2023-12-08: 600 mg via ORAL
  Filled 2023-12-08: qty 1

## 2023-12-08 MED ORDER — SUMATRIPTAN 25 MG TABLET
50.0000 mg | ORAL_TABLET | Freq: Once | ORAL | Status: DC
Start: 2023-12-08 — End: 2023-12-11
  Administered 2023-12-08: 0 mg via ORAL
  Filled 2023-12-08: qty 2

## 2023-12-08 MED ORDER — WHITE PETROLATUM 42 % TOPICAL OINTMENT
TOPICAL_OINTMENT | Freq: Every day | CUTANEOUS | Status: DC | PRN
Start: 2023-12-08 — End: 2023-12-11
  Filled 2023-12-08: qty 100

## 2023-12-08 MED ORDER — IPRATROPIUM BROMIDE 0.02 % SOLUTION FOR INHALATION
0.2500 mg | Freq: Four times a day (QID) | RESPIRATORY_TRACT | Status: DC
Start: 2023-12-08 — End: 2023-12-11
  Administered 2023-12-08: 0.25 mg via RESPIRATORY_TRACT
  Administered 2023-12-08: 0 mg via RESPIRATORY_TRACT
  Administered 2023-12-08 – 2023-12-09 (×2): 0.25 mg via RESPIRATORY_TRACT
  Administered 2023-12-09: 0 mg via RESPIRATORY_TRACT
  Administered 2023-12-09 – 2023-12-10 (×5): 0.25 mg via RESPIRATORY_TRACT
  Administered 2023-12-10 – 2023-12-11 (×2): 0 mg via RESPIRATORY_TRACT
  Administered 2023-12-11: 0.25 mg via RESPIRATORY_TRACT

## 2023-12-08 MED ORDER — METRONIDAZOLE 500 MG TABLET
500.0000 mg | ORAL_TABLET | Freq: Three times a day (TID) | ORAL | Status: DC
Start: 2023-12-08 — End: 2023-12-11
  Administered 2023-12-08 – 2023-12-11 (×9): 500 mg via ORAL
  Filled 2023-12-08 (×9): qty 1

## 2023-12-08 MED ORDER — LACTULOSE 10 GRAM/15 ML ORAL SOLUTION
30.0000 mL | Freq: Every day | ORAL | Status: DC
Start: 2023-12-08 — End: 2023-12-11
  Administered 2023-12-08 – 2023-12-09 (×2): 0 mL via ORAL
  Administered 2023-12-10: 30 mL via ORAL
  Administered 2023-12-11: 0 mL via ORAL
  Filled 2023-12-08 (×3): qty 30

## 2023-12-08 MED ORDER — MINERAL OIL-HYDROPHIL PETROLAT TOPICAL OINTMENT
TOPICAL_OINTMENT | Freq: Four times a day (QID) | CUTANEOUS | 0 refills | Status: AC | PRN
Start: 2023-12-08 — End: ?

## 2023-12-08 MED ORDER — CIPROFLOXACIN 500 MG TABLET
750.0000 mg | ORAL_TABLET | Freq: Two times a day (BID) | ORAL | Status: DC
Start: 2023-12-08 — End: 2023-12-11
  Administered 2023-12-08 – 2023-12-11 (×7): 750 mg via ORAL
  Filled 2023-12-08 (×7): qty 2

## 2023-12-08 MED ORDER — INSULIN GLARGINE 100 UNITS/ML SUBQ - CHARGE BY DOSE
40.0000 [IU] | Freq: Every day | SUBCUTANEOUS | Status: DC
Start: 2023-12-09 — End: 2023-12-09
  Administered 2023-12-09: 40 [IU] via SUBCUTANEOUS
  Filled 2023-12-08: qty 40

## 2023-12-08 MED ORDER — SENNOSIDES 8.6 MG-DOCUSATE SODIUM 50 MG TABLET
1.0000 | ORAL_TABLET | Freq: Two times a day (BID) | ORAL | Status: DC
Start: 2023-12-08 — End: 2023-12-11
  Administered 2023-12-08 – 2023-12-11 (×7): 1 via ORAL
  Filled 2023-12-08 (×7): qty 1

## 2023-12-09 DIAGNOSIS — K429 Umbilical hernia without obstruction or gangrene: Secondary | ICD-10-CM

## 2023-12-09 DIAGNOSIS — I5189 Other ill-defined heart diseases: Secondary | ICD-10-CM

## 2023-12-09 LAB — POC BLOOD GLUCOSE (RESULTS)
GLUCOSE, POC: 388 mg/dL — ABNORMAL HIGH (ref 70–110)
GLUCOSE, POC: 400 mg/dL — ABNORMAL HIGH (ref 70–110)
GLUCOSE, POC: 363 mg/dL — ABNORMAL HIGH (ref 70–110)
GLUCOSE, POC: 427 mg/dL — ABNORMAL HIGH (ref 70–110)

## 2023-12-09 MED ORDER — SIMETHICONE 80 MG CHEWABLE TABLET
80.0000 mg | CHEWABLE_TABLET | Freq: Four times a day (QID) | ORAL | Status: DC | PRN
Start: 2023-12-09 — End: 2023-12-11
  Administered 2023-12-09 – 2023-12-10 (×2): 80 mg via ORAL
  Filled 2023-12-09 (×2): qty 1

## 2023-12-09 MED ORDER — INSULIN GLARGINE 100 UNITS/ML SUBQ - CHARGE BY DOSE
50.0000 [IU] | Freq: Every day | SUBCUTANEOUS | Status: DC
Start: 2023-12-10 — End: 2023-12-11
  Administered 2023-12-10 – 2023-12-11 (×2): 50 [IU] via SUBCUTANEOUS
  Filled 2023-12-09 (×2): qty 50

## 2023-12-10 LAB — CBC
HCT: 43.8 % (ref 34.8–46.0)
HGB: 14.6 g/dL (ref 11.5–16.0)
MCH: 31.2 pg (ref 26.0–32.0)
MCHC: 33.3 g/dL (ref 31.0–35.5)
MCV: 93.6 fL (ref 78.0–100.0)
MPV: 10.5 fL (ref 8.7–12.5)
PLATELETS: 243 x10ˆ3/uL (ref 150–400)
RBC: 4.68 x10ˆ6/uL (ref 3.85–5.22)
RDW-CV: 14.2 % (ref 11.5–15.5)
WBC: 10.3 x10ˆ3/uL (ref 3.7–11.0)

## 2023-12-10 LAB — BASIC METABOLIC PANEL
ANION GAP: 11 mmol/L (ref 4–13)
BUN/CREA RATIO: 20 (ref 6–22)
BUN: 14 mg/dL (ref 8–25)
CALCIUM: 8.7 mg/dL (ref 8.6–10.2)
CHLORIDE: 93 mmol/L — ABNORMAL LOW (ref 96–111)
CO2 TOTAL: 27 mmol/L (ref 22–30)
CREATININE: 0.7 mg/dL (ref 0.60–1.05)
GLUCOSE: 434 mg/dL (ref 65–125)
POTASSIUM: 4.4 mmol/L (ref 3.5–5.1)
SODIUM: 131 mmol/L — ABNORMAL LOW (ref 136–145)
eGFRcr - FEMALE: >90 mL/min/1.73mˆ2 (ref >=60)

## 2023-12-10 LAB — POC BLOOD GLUCOSE (RESULTS)
GLUCOSE, POC: 429 mg/dL — ABNORMAL HIGH (ref 70–110)
GLUCOSE, POC: 397 mg/dL — ABNORMAL HIGH (ref 70–110)
GLUCOSE, POC: 391 mg/dL — ABNORMAL HIGH (ref 70–110)
GLUCOSE, POC: 407 mg/dL — ABNORMAL HIGH (ref 70–110)

## 2023-12-10 LAB — MAGNESIUM: MAGNESIUM: 1.8 mg/dL (ref 1.8–2.6)

## 2023-12-10 LAB — PHOSPHORUS: PHOSPHORUS: 3.7 mg/dL (ref 2.4–4.7)

## 2023-12-10 MED ORDER — NICOTINE (POLACRILEX) 2 MG BUCCAL LOZENGE
4.0000 mg | LOZENGE | BUCCAL | Status: DC | PRN
Start: 2023-12-10 — End: 2023-12-11
  Administered 2023-12-10 – 2023-12-11 (×2): 4 mg via ORAL
  Filled 2023-12-10: qty 2

## 2023-12-11 ENCOUNTER — Ambulatory Visit (HOSPITAL_COMMUNITY)
Admission: RE | Admit: 2023-12-11 | Discharge: 2023-12-11 | Disposition: A | Discharge status: Home / Self Care | Payer: MEDICAID | Source: Ambulatory Visit

## 2023-12-11 ENCOUNTER — Telehealth (HOSPITAL_COMMUNITY): Payer: Self-pay | Admitting: Medical

## 2023-12-11 ENCOUNTER — Other Ambulatory Visit (HOSPITAL_COMMUNITY): Payer: Self-pay | Admitting: Medical

## 2023-12-11 ENCOUNTER — Encounter (HOSPITAL_COMMUNITY): Payer: Self-pay | Admitting: Internal Medicine

## 2023-12-11 ENCOUNTER — Other Ambulatory Visit (HOSPITAL_COMMUNITY): Payer: Self-pay | Admitting: Internal Medicine

## 2023-12-11 ENCOUNTER — Encounter (HOSPITAL_BASED_OUTPATIENT_CLINIC_OR_DEPARTMENT_OTHER): Payer: Self-pay

## 2023-12-11 ENCOUNTER — Other Ambulatory Visit: Payer: Self-pay

## 2023-12-11 VITALS — BP 133/84 | HR 88 | Temp 97.3°F | Resp 16 | Ht 65.0 in | Wt 295.0 lb

## 2023-12-11 DIAGNOSIS — M86171 Other acute osteomyelitis, right ankle and foot: Principal | ICD-10-CM | POA: Insufficient documentation

## 2023-12-11 LAB — POC BLOOD GLUCOSE (RESULTS)
GLUCOSE, POC: 387 mg/dL — ABNORMAL HIGH (ref 70–110)
GLUCOSE, POC: 390 mg/dL — ABNORMAL HIGH (ref 70–110)

## 2023-12-11 LAB — CBC
HCT: 44.6 % (ref 34.8–46.0)
HGB: 14.6 g/dL (ref 11.5–16.0)
MCH: 30.9 pg (ref 26.0–32.0)
MCHC: 32.7 g/dL (ref 31.0–35.5)
MCV: 94.3 fL (ref 78.0–100.0)
MPV: 10.8 fL (ref 8.7–12.5)
PLATELETS: 234 x10ˆ3/uL (ref 150–400)
RBC: 4.73 x10ˆ6/uL (ref 3.85–5.22)
RDW-CV: 14.2 % (ref 11.5–15.5)
WBC: 11.8 x10ˆ3/uL — ABNORMAL HIGH (ref 3.7–11.0)

## 2023-12-11 LAB — PARACHUTE DME

## 2023-12-11 LAB — ANAEROBIC CULTURE

## 2023-12-11 LAB — BASIC METABOLIC PANEL
ANION GAP: 10 mmol/L (ref 4–13)
BUN/CREA RATIO: 19 (ref 6–22)
BUN: 12 mg/dL (ref 8–25)
CALCIUM: 8.8 mg/dL (ref 8.6–10.2)
CHLORIDE: 97 mmol/L (ref 96–111)
CO2 TOTAL: 26 mmol/L (ref 22–30)
CREATININE: 0.62 mg/dL (ref 0.60–1.05)
GLUCOSE: 378 mg/dL — ABNORMAL HIGH (ref 65–125)
POTASSIUM: 4.4 mmol/L (ref 3.5–5.1)
SODIUM: 133 mmol/L — ABNORMAL LOW (ref 136–145)
eGFRcr - FEMALE: >90 mL/min/1.73mˆ2 (ref >=60)

## 2023-12-11 LAB — PHOSPHORUS: PHOSPHORUS: 3.5 mg/dL (ref 2.4–4.7)

## 2023-12-11 LAB — MAGNESIUM: MAGNESIUM: 1.9 mg/dL (ref 1.8–2.6)

## 2023-12-11 LAB — CREATINE KINASE (CK), TOTAL, SERUM OR PLASMA: CREATINE KINASE: 28 U/L (ref 25–190)

## 2023-12-11 MED ORDER — DEXTROSE 5 % IN WATER (D5W) INTRAVENOUS SOLUTION
1500.0000 mg | Freq: Once | INTRAVENOUS | Status: AC
Start: 2023-12-11 — End: 2023-12-11
  Administered 2023-12-11: 1500 mg via INTRAVENOUS
  Administered 2023-12-11: 0 mg via INTRAVENOUS
  Filled 2023-12-11: qty 75

## 2023-12-11 MED ORDER — SENNOSIDES 8.6 MG-DOCUSATE SODIUM 50 MG TABLET: 1.0000 | ORAL_TABLET | Freq: Two times a day (BID) | ORAL | 0 refills | Status: DC

## 2023-12-11 MED ORDER — DEXTROSE 5% IN WATER (D5W) FLUSH BAG - 250 ML
INTRAVENOUS | Status: DC | PRN
Start: 2023-12-11 — End: 2023-12-12
  Administered 2023-12-11: 0 g via INTRAVENOUS

## 2023-12-11 MED ORDER — HYDROMORPHONE (PF) 0.5 MG/0.5 ML INJECTION SYRINGE
0.5000 mg | INJECTION | INTRAMUSCULAR | Status: AC
Start: 2023-12-11 — End: 2023-12-11
  Administered 2023-12-11: 0.5 mg via INTRAVENOUS
  Filled 2023-12-11: qty 0.5

## 2023-12-11 MED ORDER — OXYCODONE 5 MG TABLET: 10.0000 mg | ORAL_TABLET | ORAL | 0 refills | Status: DC | PRN

## 2023-12-11 MED ORDER — SIMETHICONE 80 MG CHEWABLE TABLET
80.0000 mg | CHEWABLE_TABLET | Freq: Four times a day (QID) | ORAL | 0 refills | Status: AC | PRN
Start: 2023-12-11 — End: 2024-01-10

## 2023-12-11 MED ORDER — LACTULOSE 10 GRAM/15 ML ORAL SOLUTION: 30.0000 mL | Freq: Every day | ORAL | 0 refills | Status: DC

## 2023-12-11 NOTE — Care Management Notes (Addendum)
 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 Taft.    CM placed an order for [facility] for today.    Lauraine Drivers, NP updated and plans to discharge patient home today.    12/11/2023 09:57 CM called Infusion Center regarding patients outpatient infusion. Per Randine, Dr. Taft still needs to place a treatment plan for patient.    12/11/2023 10:27 CM spoke to Dr. Taft regarding ordering patients treatment plan.    CM also met with patient to discuss DME need for front wheeled walker. CM Provided patient CarePort listing with CMS ratings for Home Health serving the [phone] zip code. The list provided is available in CarePort. FOC signed for [facility]. DME FWW order pended. Order submitted via [employer]. FOC uploaded to [employer].    12/11/2023 11:35 CM received a phone call from Dr. Rojelio to discuss patients discharge plan and goal. CM explained that patient is still refusing home health services at time of discharge.    CM called and spoke to [facility] with Infusion Center regarding patients outpatient infusion needed today for IV Dalvance . Per Randine, Dr. Taft did place treatment plan. Patient is able to have outpatient infusion at 12:30 today. Randine did confirm that infusion center would help patient schedule next/last dose of IV Dalvance .    12/11/2023 11:56 [facility] POD signed by patient and uploaded to [employer].    CM again discussed  home health services at time of discharge. Patient was adamant that she does not want home health services.    Discharge Plan:  Home (Patient/Family Member/other) (code 1)    The patient will continue to be evaluated for developing discharge needs.     Case Manager: Charmaine Prima, RN  Phone: [phone]

## 2023-12-11 NOTE — Discharge Summary (Signed)
 [facility]  DISCHARGE SUMMARY      PATIENT NAME:  Yvette Schmidt, Yvette Schmidt  MRN:  [id number]  DOB:  Nov 24, 1975      ENCOUNTER START DATE: 12/05/2023  INPATIENT ADMISSION DATE: 12/05/2023    DISCHARGE DATE:  12/11/2023    ATTENDING PHYSICIAN: Adeniyi, John A, MD  PRIMARY CARE PHYSICIAN: Ripley Carmell Puller, MD     ADMISSION DIAGNOSIS: Gas gangrene of foot (CMS HCC)  No chief complaint on file.          DISCHARGE DIAGNOSIS: gas gangrene right foot  Hospital Problems) (* Primary Problem)    Diagnosis Date Noted    *Gas gangrene of foot (CMS HCC) 12/05/2023    Osteomyelitis, unspecified site, unspecified type (CMS [facility]) 12/06/2023      Resolved Hospital Problems   No resolved problems to display.     Active Non-Hospital Problems    Diagnosis Date Noted    Rash and other nonspecific skin eruption 10/13/2022    Insomnia 06/05/2021    GAD (generalized anxiety disorder) 06/05/2021    Open wound 04/05/2021    Abscess 03/27/2021    Stress test 04/06/2016    2-D echo 04/06/2016    Depression 01/02/2006    Headache 12/13/2005    Migraine Headaches 10/19/2005    GERD 08/15/2005    Sleep Apnea 09/30/2003    Asthma 08/22/2003    Type 2 diabetes mellitus with peripheral neuropathy (CMS HCC) 08/22/2003        DISCHARGE MEDICATIONS:     Current Discharge Medication List        START taking these medications.        Details   lactulose 10 gram/15 mL Solution  Commonly known as: ENULOSE   30 mL, Oral, Daily  Qty: 900 mL  Refills: 0     mineral oil-hydrophil petrolat Ointment  Commonly known as: AQUAPHOR   Apply Topically, 4 TIMES DAILY PRN  Qty: 1 Each  Refills: 0     oxyCODONE  5 mg Tablet  Commonly known as: ROXICODONE    10 mg, Oral, EVERY 4 HOURS PRN  Qty: 24 Tablet  Refills: 0     sennosides-docusate sodium  8.6-50 mg Tablet  Commonly known as: SENOKOT-S   1 Tablet, Oral, 2 TIMES DAILY  Qty: 60 Tablet  Refills: 0     simethicone 80 mg Tablet, Chewable  Commonly known as: MYLICON   80 mg, Oral, EVERY 6 HOURS PRN  Qty: 120  Tablet  Refills: 0            CONTINUE these medications - NO CHANGES were made during your visit.        Details   Accu-Chek Guide Glucose Meter Misc  Generic drug: Blood-Glucose Meter   Use three times daily with lancets and test strips  Qty: 1 Each  Refills: 0     Accu-Chek Guide test strips Strip  Generic drug: Blood Sugar Diagnostic   USE 1 STRIP TO CHECK BLOOD SUGAR FOUR TIMES DAILY BEFORE MEALS AND AT BEDTIME  Qty: 400 Each  Refills: 1     Accu-Chek Softclix Lancets Misc  Generic drug: Lancets   Use to Check blood Sugar Three Times Daily  Qty: 100 Each  Refills: 3     * albuterol  sulfate 2.5 mg /3 mL (0.083 %) nebulizer solution  Commonly known as: PROVENTIL    Inhale 3 mL (2.5 mg total) Via Nebulizer Every 6 hours as needed for Wheezing  Qty: 180 mL  Refills: 2     *  albuterol  sulfate 90 mcg/actuation oral inhaler  Commonly known as: Ventolin  HFA   INHALE 1-2 PUFFS BY INHALATION EVERY 6 HOURS AS NEEDED  Qty: 3 Each  Refills: 2     amitriptyline  50 mg Tablet  Commonly known as: ELAVIL    50 mg, Oral, NIGHTLY  Qty: 90 Tablet  Refills: 0     aspirin 81 mg Tablet, Chewable   81 mg, Daily  Refills: 0     azelastine  137 mcg (0.1 %) Spray, Non-Aerosol  Commonly known as: ASTELIN    ADMINISTER 1 SPRAY INTO EACH NOSTRIL TWICE DAILY FOR 30 DAYS  Qty: 30 mL  Refills: 0     betamethasone  valerate 0.1 % Ointment  Commonly known as: VALISONE    Topical, 2 TIMES DAILY  Qty: 45 g  Refills: 11     clonazePAM  0.5 mg Tablet  Commonly known as: klonoPIN    0.5 mg, Oral, 2 TIMES DAILY  Qty: 45 Tablet  Refills: 0     EPINEPHrine  0.3 mg/0.3 mL Auto-Injector   0.3 mg, IntraMUSCULAR, ONCE PRN  Qty: 2 Each  Refills: 0     fluticasone  propionate 50 mcg/actuation Spray, Suspension  Commonly known as: FLONASE    2 Sprays, Each Nostril, 2 TIMES DAILY  Qty: 16 g  Refills: 11     furosemide  40 mg Tablet  Commonly known as: LASIX    40 mg, Oral, DAILY  Qty: 90 Tablet  Refills: 3     gabapentin  800 mg Tablet  Commonly known as: NEURONTIN    Take 1/2  tablet by mouth in the morning and afternoon, then take 1 tablet in the evening.  Qty: 60 Tablet  Refills: 0     Ibuprofen 200 mg Tablet  Commonly known as: MOTRIN   800 mg, 4 TIMES DAILY PRN  Refills: 0     Insulin  Needles (Disposable) 32 gauge x 1/4 Needle  Commonly known as: Sure Comfort Pen Needle   USE TO INJECT INSULIN  TWICE DAILY  Qty: 100 Each  Refills: 3     ipratropium 0.02 % Solution  Commonly known as: ATROVENT    0.25 mg, Nebulization, EVERY 6 HOURS  Qty: 150 mL  Refills: 0     Januvia  100 mg Tablet  Generic drug: SITagliptin  phosphate   100 mg, Oral, DAILY  Qty: 90 Tablet  Refills: 4     Linzess  290 mcg Capsule  Generic drug: linaCLOtide    Take 1 Capsule (290 mcg total) by mouth Every morning  Qty: 90 Capsule  Refills: 0     meloxicam  15 mg Tablet  Commonly known as: MOBIC    15 mg, Oral, NIGHTLY  Qty: 90 Tablet  Refills: 0     mupirocin  2 % Ointment  Commonly known as: BACTROBAN    Apply Topically, 3 TIMES DAILY  Qty: 30 g  Refills: 3     Nebulizers Misc   Use with Nebulizer medication  Qty: 1 Each  Refills: 0     NovoLOG  Flexpen U-100 Insulin  100 unit/mL (3 mL) Insulin  Pen  Generic drug: insulin  aspart U-100   Subcutaneous, 2 TIMES DAILY BEFORE MEALS, per Sliding Scale. <150 No injection, 150-200 2 units, 201-250 4 units, 251-300 6 units, 301-350 9 units, 351-400 12 units  Qty: 15 mL  Refills: 0     pantoprazole  40 mg Tablet, Delayed Release (E.C.)  Commonly known as: PROTONIX    40 mg, Oral, 2 TIMES DAILY  Qty: 180 Tablet  Refills: 0     rosuvastatin  20 mg Tablet  Commonly known as:  CRESTOR    20 mg, Oral, EVERY EVENING  Qty: 90 Tablet  Refills: 4     SUMAtriptan  50 mg Tablet  Commonly known as: IMITREX    TAKE 1 TABLET BY MOUTH ONCE DAILY AS NEEDED FOR MIGRAINES MAY REPEAT IN 2 HOURS IF NEEDED FOR 30 DAYS DO NOT EXCEED MORE THAN 9 DOSES IN A 30 DAY PERIOD  Qty: 9 Tablet  Refills: 0     Symbicort  160-4.5 mcg/actuation oral inhaler  Generic drug: budesonide -formoteroL    2 Puffs, Inhalation, 2 TIMES  DAILY  Qty: 10.2 g  Refills: 0     vitamin B complex Tablet   1 Tablet, Daily  Refills: 0           * This list has 2 medication(s) that are the same as other medications prescribed for you. Read the directions carefully, and ask your doctor or other care provider to review them with you.                STOP taking these medications.      ciprofloxacin HCl 750 mg Tablet  Commonly known as: CIPRO     metroNIDAZOLE 500 mg Tablet  Commonly known as: FLAGYL            ASK your doctor about these medications.        Details   cetirizine  10 mg Tablet  Commonly known as: zyrTEC   Ask about: Should I take this medication?   10 mg, Oral, Daily  Qty: 90 Tablet  Refills: 0     polyethylene glycol 17 gram Powder in Packet  Commonly known as: MIRALAX   Ask about: Should I take this medication?   17 g, Oral, DAILY PRN  Qty: 20 Packet  Refills: 0              DISCHARGE INSTRUCTIONS:      DISCHARGE INSTRUCTION - DIET     Diet: RESUME HOME DIET      DISCHARGE INSTRUCTION - ACTIVITY     Activity: NO WEIGHT BEARING ON RIGHT LEG      DISCHARGE INSTRUCTION - INCISION/WOUND CARE    Daily dressing change, wash with wound spay. Place wet to dry dressing, secure with kerlix and ace wrap     Instructions for incision/wound care: Wound Care as Instructed      FOLLOW-UP: VASCULAR SURGERY - [facility] [place] [place]     Reason for visit: POST-OP VISIT    Follow-up in: 1 WEEK      FOLLOW-UP: [facility]     REASON FOR VISIT? osteomylitis    Follow-up in: 2 WEEKS    WHICH LOCATION? [place]      DME - INFUSION AND LINE CARE ORDERS     Ht 165.1 cm    Wt 133.3 kg    Current Attending: N/A    Medical Condition or Diagnosis which is primary reason for equipment: Right foot osteomyelitis    Start Date 12/08/2023    Contact information for lab follow up Specify Provider and Contact info    Provider and Contact Info: Dr Taft Fax [phone]    Name of Medication/Infusion, Dose, Route, Frequency, Course Duration  IV dalvance 1500 mg weekly x 2 doses    Freedom of Choice: I have informed patient of their freedom of choice with respect to DME providers    Type of Line PERIPHERAL    Line Flushes and Dressing Change Per Infusion Company Protocol YES  Post-Discharge Follow Up Appointments       Go to [facility] - Emergency Department    Phone: [phone]    Where: [id number], [place] [address]    Follow up with Vascular Surgery, [facility]    Phone: [phone]    Where: [address] [place] [facility] [address]    Schedule an appointment with Avelino Ripley Pop, MD as soon as possible for a visit in 2 days    Phone: [phone]    Where: [address], [place] [address]            REASON FOR HOSPITALIZATION AND HOSPITAL COURSE:  This is a 48 y.o., female       patient admitted due to right foot osteomyelitis and wet gangrene.  Patient underwent the below procedure without complication.  Patient was recommended to utilize wound VAC therapy which she agreed to do while in hospital however refuses to go home with it and refuses to have home health come to her house.  Patient verbalizes understanding that she puts herself at risk for limb loss.  Patient was scheduled for Dalvance infusion today and was discharged after infusion in stable condition      PROCEDURES PERFORMED:   Open right foot 5th ray amputation  Drainage of right foot complex abscess  Excisional debridement skin subcutaneous tissue fat fascia muscle tendon  Washout and open packing of wound        COURSE IN HOSPITAL:   Right foot osteomyelitis:  Abscess  S/p surgical intervention open right 5th ray amputation with drainage of abscess washout and open packing 11/13  S/p Dalvance on 11/09 at 2:30 a.m.  Antibiotics per infectious disease  Venous duplex negative for lower extremity deep vein thrombosis  Follow up infectious workup   ID consulted by vascular  Optimize analgesia   Optimize bowel regimen    Encourage airway clearance   Management per vascular  Wound vac refusing at discharge refusing HH  Dalvance infusion scheduled for Monday     Intermittent atypical chest pains: No recent or active pain reported  EKG without acute findings  Will c/w ASA and statin  Will s/w Nitro prn  Caridac tele monitoring   Will check lipid panel (OK), A1c(11.6) and UDY(8.372)  Given presence of significant risk factors would likely benefit from a stress test when stable-refused stress test     History of diastolic dysfunction:  Appears euvolemic  Optimize home meds   Avoid volume overload     Uncontrolled DM:   Continue titration of insulin  management Lantus increased to 40 units sliding scale coverage also increase due to blood sugars remaining greater than 400  Refuses diabetic diet  Will hold off on OHA meds     Asthma/Chronic Obstructive Pulmonary Disease: No wheezing   Continue nebs   Monitor respiratory status     Hypertension:   Optimize home meds     Abdominal pain-resolved  -CT-1. Suspect some areas of atelectasis in the lungs. Otherwise no definite acute cardiopulmonary abnormality.  2. Hepatomegaly and fatty infiltration of the liver.  3. Diffuse colonic stool retention.  4. Fat-containing umbilical hernia.  -lactulose ordered for stool retention-refused  -pulmonary toileting        DOES PATIENT HAVE ADVANCED DIRECTIVES:  No, Information Offered and Refused    ADVANCED CARE PLANNING - POST form not discussed    CONDITION ON DISCHARGE: Alert, Oriented, and VS Stable    DISCHARGE DISPOSITION:  Home discharge     Copies sent to Care Team         Relationship Specialty Notifications Start End    [place], Ripley Pop, MD PCP - General INTERNAL MEDICINE Admissions 03/25/21     Phone: [phone] Fax: [phone]         [address] [place] [address]              Lauraine Drivers, APRN, CNP

## 2023-12-11 NOTE — Care Plan (Signed)
 Problem: Adult Inpatient Plan of Care  Goal: Absence of Hospital-Acquired Illness or Injury  Outcome: Ongoing (see interventions/notes)  Intervention: Identify and Manage Fall Risk  Recent Flowsheet Documentation  Taken 12/11/2023 1000 by Nichole BROCKS, LPN  Safety Promotion/Fall Prevention:   activity supervised   fall prevention program maintained   safety round/check completed   nonskid shoes/slippers when out of bed  Taken 12/11/2023 0859 by Nichole BROCKS, LPN  Safety Promotion/Fall Prevention:   activity supervised   fall prevention program maintained   safety round/check completed   nonskid shoes/slippers when out of bed  Intervention: Prevent Skin Injury  Recent Flowsheet Documentation  Taken 12/11/2023 1000 by Nichole BROCKS, LPN  Body Position: supine, head elevated  Taken 12/11/2023 0859 by Nichole BROCKS, LPN  Body Position: supine, head elevated  Skin Protection: adhesive use limited  Intervention: Prevent and Manage VTE (Venous Thromboembolism) Risk  Recent Flowsheet Documentation  Taken 12/11/2023 0859 by Nichole BROCKS, LPN  VTE Prevention/Management: ambulation promoted  Intervention: Prevent Infection  Recent Flowsheet Documentation  Taken 12/11/2023 1000 by Nichole BROCKS, LPN  Infection Prevention: barrier precautions utilized  Taken 12/11/2023 0859 by Nichole BROCKS, LPN  Infection Prevention: barrier precautions utilized  Goal: Optimal Comfort and Wellbeing  Outcome: Ongoing (see interventions/notes)  Intervention: Provide Person-Centered Care  Recent Flowsheet Documentation  Taken 12/11/2023 0859 by Nichole BROCKS, LPN  Trust Relationship/Rapport:   care explained   thoughts/feelings acknowledged  Goal: Rounds/Family Conference  Outcome: Ongoing (see interventions/notes)     Problem: Health Knowledge, Opportunity to Enhance (Adult,Obstetrics,Pediatric)  Goal: Knowledgeable about Health Subject/Topic  Description: Patient will demonstrate the desired outcomes by discharge/transition of care.  Outcome: Ongoing (see interventions/notes)  Intervention:  Enhance Health Knowledge  Recent Flowsheet Documentation  Taken 12/11/2023 0859 by Nichole BROCKS, LPN  Family/Support System Care:   self-care encouraged   support provided  Supportive Measures: active listening utilized     Problem: Wound  Goal: Optimal Coping  Outcome: Ongoing (see interventions/notes)  Intervention: Support Patient and Family Response  Recent Flowsheet Documentation  Taken 12/11/2023 0859 by Nichole BROCKS, LPN  Family/Support System Care:   self-care encouraged   support provided  Supportive Measures: active listening utilized  Goal: Optimal Functional Ability  Outcome: Ongoing (see interventions/notes)  Intervention: Optimize Functional Ability  Recent Flowsheet Documentation  Taken 12/11/2023 1000 by Nichole BROCKS, LPN  Activity Management: ROM, active encouraged  Activity Assistance Provided: assistance, stand-by  Taken 12/11/2023 0859 by Nichole BROCKS, LPN  Activity Management: ROM, active encouraged  Activity Assistance Provided: assistance, stand-by  Goal: Absence of Infection Signs and Symptoms  Outcome: Ongoing (see interventions/notes)  Intervention: Prevent or Manage Infection  Recent Flowsheet Documentation  Taken 12/11/2023 0859 by Nichole BROCKS, LPN  Fever Reduction/Comfort Measures:   lightweight clothing   lightweight bedding  Goal: Improved Oral Intake  Outcome: Ongoing (see interventions/notes)  Goal: Optimal Pain Control and Function  Outcome: Ongoing (see interventions/notes)  Intervention: Prevent or Manage Pain  Recent Flowsheet Documentation  Taken 12/11/2023 0859 by Nichole BROCKS, LPN  Sleep/Rest Enhancement: awakenings minimized  Goal: Skin Health and Integrity  Outcome: Ongoing (see interventions/notes)  Intervention: Optimize Skin Protection  Recent Flowsheet Documentation  Taken 12/11/2023 1000 by Nichole BROCKS, LPN  Activity Management: ROM, active encouraged  Head of Bed [facility]) Positioning: HOB elevated  Taken 12/11/2023 0859 by Nichole BROCKS, LPN  Pressure Reduction Techniques: Frequent weight shifting encouraged  Pressure  Reduction Devices: Repositioning wedges/pillows utilized  Activity Management: ROM, active encouraged  Head of Bed (HOB) Positioning:  HOB elevated  Goal: Optimal Wound Healing  Outcome: Ongoing (see interventions/notes)  Intervention: Promote Wound Healing  Recent Flowsheet Documentation  Taken 12/11/2023 1000 by Nichole BROCKS, LPN  Activity Management: ROM, active encouraged  Taken 12/11/2023 0859 by Nichole BROCKS, LPN  Pressure Reduction Techniques: Frequent weight shifting encouraged  Pressure Reduction Devices: Repositioning wedges/pillows utilized  Sleep/Rest Enhancement: awakenings minimized  Activity Management: ROM, active encouraged     Problem: Skin Injury Risk Increased  Goal: Skin Health and Integrity  Outcome: Ongoing (see interventions/notes)  Intervention: Optimize Skin Protection  Recent Flowsheet Documentation  Taken 12/11/2023 1000 by Nichole BROCKS, LPN  Activity Management: ROM, active encouraged  Head of Bed [facility]) Positioning: HOB elevated  Taken 12/11/2023 0859 by Nichole BROCKS, LPN  Pressure Reduction Techniques: Frequent weight shifting encouraged  Pressure Reduction Devices: Repositioning wedges/pillows utilized  Skin Protection: adhesive use limited  Activity Management: ROM, active encouraged  Head of Bed (HOB) Positioning: HOB elevated

## 2023-12-11 NOTE — Progress Notes (Signed)
 Patient seen and examined.  She has multiple complaints as detailed in LPN [place] note.   No fever.  Dressing taken down and the appearance is much improved.               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  Continue wound vac. Discussed with patient the benefits of wound vac and patient verbalized understanding but refuses home wound vac, SNF and insists on discharge on Monday.

## 2023-12-11 NOTE — Progress Notes (Signed)
 Patient seen, chart removed.  She wants to know what time the outpatient infusion center closes so that she can arrange a ride with her sister and upset when I did not know the answer.   Wound vac is in place.  A/P: POD #5 s/p open 5th ray amputation and debridement of foot.  Appreciate our consultants' care  Plan d/c on Monday.   Case mgmt consultation for dressing supplies as patient refuses HHC.

## 2023-12-11 NOTE — Nurses Notes (Signed)
 1329- PIV flushed with 3 cc NS with + BR. Dalvance  started to run over 30 min. Pamella Samons, RN  1405- Infusion and line flush complete. Tolerated well. PIV flushed with 3 cc NS with + BR. PIV removed and guaze and coban applied. Left ambulatory and aware of next appt. Raylea Adcox, RN

## 2023-12-15 LAB — FUNGUS CULTURE

## 2023-12-18 ENCOUNTER — Ambulatory Visit: Payer: MEDICAID

## 2023-12-18 ENCOUNTER — Ambulatory Visit (INDEPENDENT_AMBULATORY_CARE_PROVIDER_SITE_OTHER): Payer: Self-pay | Admitting: Vascular & Interventional Radiology

## 2023-12-19 ENCOUNTER — Other Ambulatory Visit: Payer: Self-pay

## 2023-12-19 ENCOUNTER — Encounter (HOSPITAL_COMMUNITY): Payer: Self-pay

## 2023-12-19 ENCOUNTER — Ambulatory Visit (INDEPENDENT_AMBULATORY_CARE_PROVIDER_SITE_OTHER): Payer: Self-pay | Admitting: Primary Care

## 2023-12-19 ENCOUNTER — Ambulatory Visit (HOSPITAL_COMMUNITY)
Admission: RE | Admit: 2023-12-19 | Discharge: 2023-12-19 | Disposition: A | Discharge status: Home / Self Care | Payer: MEDICAID | Source: Ambulatory Visit

## 2023-12-19 ENCOUNTER — Inpatient Hospital Stay
Admission: AD | Admit: 2023-12-19 | Discharge: 2023-12-27 | DRG: 239 | Disposition: A | Discharge status: Home Health | Payer: MEDICAID | Source: Ambulatory Visit

## 2023-12-19 ENCOUNTER — Encounter (INDEPENDENT_AMBULATORY_CARE_PROVIDER_SITE_OTHER): Payer: Self-pay | Admitting: Vascular & Interventional Radiology

## 2023-12-19 VITALS — BP 143/89 | HR 104 | Temp 97.7°F | Resp 17 | Ht 65.0 in | Wt 295.0 lb

## 2023-12-19 VITALS — BP 162/78 | HR 108 | Temp 97.4°F | Ht 65.0 in | Wt 293.5 lb

## 2023-12-19 DIAGNOSIS — Z794 Long term (current) use of insulin: Secondary | ICD-10-CM

## 2023-12-19 DIAGNOSIS — I10 Essential (primary) hypertension: Secondary | ICD-10-CM | POA: Diagnosis present

## 2023-12-19 DIAGNOSIS — L089 Local infection of the skin and subcutaneous tissue, unspecified: Principal | ICD-10-CM | POA: Diagnosis present

## 2023-12-19 DIAGNOSIS — Z7982 Long term (current) use of aspirin: Secondary | ICD-10-CM

## 2023-12-19 DIAGNOSIS — Z79899 Other long term (current) drug therapy: Secondary | ICD-10-CM

## 2023-12-19 DIAGNOSIS — Z7984 Long term (current) use of oral hypoglycemic drugs: Secondary | ICD-10-CM

## 2023-12-19 DIAGNOSIS — E1165 Type 2 diabetes mellitus with hyperglycemia: Secondary | ICD-10-CM | POA: Diagnosis present

## 2023-12-19 DIAGNOSIS — A480 Gas gangrene: Secondary | ICD-10-CM | POA: Diagnosis present

## 2023-12-19 DIAGNOSIS — J4489 Other specified chronic obstructive pulmonary disease: Secondary | ICD-10-CM | POA: Diagnosis present

## 2023-12-19 DIAGNOSIS — J449 Chronic obstructive pulmonary disease, unspecified: Secondary | ICD-10-CM

## 2023-12-19 DIAGNOSIS — K5909 Other constipation: Secondary | ICD-10-CM | POA: Diagnosis present

## 2023-12-19 DIAGNOSIS — M86071 Acute hematogenous osteomyelitis, right ankle and foot: Principal | ICD-10-CM

## 2023-12-19 DIAGNOSIS — E11621 Type 2 diabetes mellitus with foot ulcer: Secondary | ICD-10-CM | POA: Diagnosis present

## 2023-12-19 DIAGNOSIS — M86171 Other acute osteomyelitis, right ankle and foot: Secondary | ICD-10-CM | POA: Insufficient documentation

## 2023-12-19 DIAGNOSIS — L03115 Cellulitis of right lower limb: Secondary | ICD-10-CM

## 2023-12-19 DIAGNOSIS — E11628 Type 2 diabetes mellitus with other skin complications: Secondary | ICD-10-CM

## 2023-12-19 DIAGNOSIS — E1152 Type 2 diabetes mellitus with diabetic peripheral angiopathy with gangrene: Principal | ICD-10-CM | POA: Diagnosis present

## 2023-12-19 DIAGNOSIS — Z6841 Body Mass Index (BMI) 40.0 and over, adult: Secondary | ICD-10-CM

## 2023-12-19 DIAGNOSIS — E1142 Type 2 diabetes mellitus with diabetic polyneuropathy: Secondary | ICD-10-CM | POA: Diagnosis present

## 2023-12-19 DIAGNOSIS — M869 Osteomyelitis, unspecified: Secondary | ICD-10-CM

## 2023-12-19 DIAGNOSIS — F17210 Nicotine dependence, cigarettes, uncomplicated: Secondary | ICD-10-CM | POA: Diagnosis present

## 2023-12-19 DIAGNOSIS — I119 Hypertensive heart disease without heart failure: Secondary | ICD-10-CM

## 2023-12-19 DIAGNOSIS — R1310 Dysphagia, unspecified: Secondary | ICD-10-CM | POA: Diagnosis not present

## 2023-12-19 DIAGNOSIS — L97519 Non-pressure chronic ulcer of other part of right foot with unspecified severity: Secondary | ICD-10-CM | POA: Diagnosis present

## 2023-12-19 DIAGNOSIS — K3184 Gastroparesis: Secondary | ICD-10-CM | POA: Diagnosis present

## 2023-12-19 DIAGNOSIS — E1143 Type 2 diabetes mellitus with diabetic autonomic (poly)neuropathy: Secondary | ICD-10-CM | POA: Diagnosis present

## 2023-12-19 DIAGNOSIS — Z89431 Acquired absence of right foot: Secondary | ICD-10-CM

## 2023-12-19 DIAGNOSIS — K219 Gastro-esophageal reflux disease without esophagitis: Secondary | ICD-10-CM | POA: Diagnosis present

## 2023-12-19 DIAGNOSIS — Z91199 Patient's noncompliance with other medical treatment and regimen due to unspecified reason: Secondary | ICD-10-CM

## 2023-12-19 DIAGNOSIS — E1169 Type 2 diabetes mellitus with other specified complication: Secondary | ICD-10-CM

## 2023-12-19 DIAGNOSIS — F419 Anxiety disorder, unspecified: Secondary | ICD-10-CM | POA: Diagnosis present

## 2023-12-19 DIAGNOSIS — B3789 Other sites of candidiasis: Secondary | ICD-10-CM | POA: Diagnosis present

## 2023-12-19 LAB — BASIC METABOLIC PANEL
ANION GAP: 10 mmol/L (ref 4–13)
BUN/CREA RATIO: 14 (ref 6–22)
BUN: 9 mg/dL (ref 8–25)
CALCIUM: 8.6 mg/dL (ref 8.6–10.2)
CHLORIDE: 98 mmol/L (ref 96–111)
CO2 TOTAL: 25 mmol/L (ref 22–30)
CREATININE: 0.64 mg/dL (ref 0.60–1.05)
GLUCOSE: 444 mg/dL (ref 65–125)
POTASSIUM: 3.9 mmol/L (ref 3.5–5.1)
SODIUM: 133 mmol/L — ABNORMAL LOW (ref 136–145)
eGFRcr - FEMALE: >90 mL/min/1.73mˆ2 (ref >=60)

## 2023-12-19 LAB — CREATINE KINASE (CK), TOTAL, SERUM OR PLASMA: CREATINE KINASE: 33 U/L (ref 25–190)

## 2023-12-19 LAB — CBC
HCT: 47.3 % — ABNORMAL HIGH (ref 34.8–46.0)
HGB: 15.6 g/dL (ref 11.5–16.0)
MCH: 30.7 pg (ref 26.0–32.0)
MCHC: 33 g/dL (ref 31.0–35.5)
MCV: 93.1 fL (ref 78.0–100.0)
MPV: 10.7 fL (ref 8.7–12.5)
PLATELETS: 232 x10ˆ3/uL (ref 150–400)
RBC: 5.08 x10ˆ6/uL (ref 3.85–5.22)
RDW-CV: 15.1 % (ref 11.5–15.5)
WBC: 10.7 x10ˆ3/uL (ref 3.7–11.0)

## 2023-12-19 LAB — C-REACTIVE PROTEIN(CRP),INFLAMMATION: CRP INFLAMMATION: 12.1 mg/L — ABNORMAL HIGH (ref <8.0)

## 2023-12-19 LAB — POC BLOOD GLUCOSE (RESULTS)
GLUCOSE, POC: 404 mg/dL — ABNORMAL HIGH (ref 70–110)
GLUCOSE, POC: 390 mg/dL — ABNORMAL HIGH (ref 70–110)

## 2023-12-19 MED ORDER — ACETAMINOPHEN 325 MG TABLET
650.0000 mg | ORAL_TABLET | Freq: Four times a day (QID) | ORAL | Status: DC | PRN
Start: 2023-12-19 — End: 2023-12-19

## 2023-12-19 MED ORDER — GABAPENTIN 400 MG CAPSULE
400.0000 mg | ORAL_CAPSULE | Freq: Two times a day (BID) | ORAL | Status: DC
  Administered 2023-12-19 – 2023-12-27 (×16): 400 mg via ORAL
  Filled 2023-12-19 (×16): qty 1

## 2023-12-19 MED ORDER — CIPROFLOXACIN 500 MG TABLET
500.0000 mg | ORAL_TABLET | Freq: Two times a day (BID) | ORAL | Status: DC
  Administered 2023-12-19 – 2023-12-27 (×16): 500 mg via ORAL
  Filled 2023-12-19 (×16): qty 1

## 2023-12-19 MED ORDER — LACTULOSE 10 GRAM/15 ML ORAL SOLUTION
30.0000 mL | Freq: Every day | ORAL | Status: DC
  Administered 2023-12-19 – 2023-12-27 (×9): 0 mL via ORAL
  Filled 2023-12-19 (×4): qty 30

## 2023-12-19 MED ORDER — INSULIN LISPRO 100 UNIT/ML SUB-Q - CHARGE BY DOSE
10.0000 [IU] | Freq: Once | SUBCUTANEOUS | Status: AC
Start: 2023-12-19 — End: 2023-12-19
  Administered 2023-12-19: 10 [IU] via SUBCUTANEOUS
  Filled 2023-12-19: qty 10

## 2023-12-19 MED ORDER — DEXTROSE 5 % IN WATER (D5W) INTRAVENOUS SOLUTION
1500.0000 mg | Freq: Once | INTRAVENOUS | Status: AC
Start: 2023-12-19 — End: 2023-12-19
  Administered 2023-12-19: 1500 mg via INTRAVENOUS
  Administered 2023-12-19: 0 mg via INTRAVENOUS
  Filled 2023-12-19: qty 75

## 2023-12-19 MED ORDER — ARFORMOTEROL 15 MCG/2 ML SOLUTION FOR NEBULIZATION
15.0000 ug | INHALATION_SOLUTION | Freq: Two times a day (BID) | RESPIRATORY_TRACT | Status: DC
  Administered 2023-12-19 – 2023-12-20 (×2): 15 ug via RESPIRATORY_TRACT
  Administered 2023-12-20: 0 ug via RESPIRATORY_TRACT
  Administered 2023-12-21 – 2023-12-27 (×13): 15 ug via RESPIRATORY_TRACT

## 2023-12-19 MED ORDER — DEXTROSE 5% IN WATER (D5W) FLUSH BAG - 250 ML
INTRAVENOUS | Status: DC | PRN
Start: 2023-12-19 — End: 2023-12-20
  Administered 2023-12-19: 0 g via INTRAVENOUS

## 2023-12-19 MED ORDER — ASPIRIN 81 MG CHEWABLE TABLET
81.0000 mg | CHEWABLE_TABLET | Freq: Every day | ORAL | Status: DC
  Administered 2023-12-20 – 2023-12-27 (×8): 81 mg via ORAL
  Filled 2023-12-19 (×8): qty 1

## 2023-12-19 MED ORDER — DEXTROSE 40 % ORAL GEL: 15.0000 g | ORAL | Status: DC | PRN

## 2023-12-19 MED ORDER — DEXTROSE 5% IN WATER (D5W) FLUSH BAG - 250 ML: INTRAVENOUS | Status: DC | PRN

## 2023-12-19 MED ORDER — ACETAMINOPHEN 1,000 MG/100 ML (10 MG/ML) INTRAVENOUS SOLUTION
1000.0000 mg | INTRAVENOUS | Status: AC
Start: 2023-12-19 — End: 2023-12-19
  Administered 2023-12-19: 0 mg via INTRAVENOUS
  Administered 2023-12-19: 1000 mg via INTRAVENOUS
  Filled 2023-12-19: qty 100

## 2023-12-19 MED ORDER — METRONIDAZOLE 500 MG TABLET
500.0000 mg | ORAL_TABLET | Freq: Two times a day (BID) | ORAL | Status: DC
  Administered 2023-12-19 – 2023-12-27 (×16): 500 mg via ORAL
  Filled 2023-12-19 (×16): qty 1

## 2023-12-19 MED ORDER — HEPARIN (PORCINE) 10,000 UNIT/ML INJECTION SOLUTION
5000.0000 [IU] | Freq: Three times a day (TID) | INTRAMUSCULAR | Status: DC
  Administered 2023-12-19 – 2023-12-27 (×25): 0 [IU] via SUBCUTANEOUS
  Filled 2023-12-19 (×13): qty 1

## 2023-12-19 MED ORDER — SODIUM CHLORIDE 0.9 % (FLUSH) INJECTION SYRINGE: 3.0000 mL | INJECTION | INTRAMUSCULAR | Status: DC | PRN

## 2023-12-19 MED ORDER — ALBUTEROL SULFATE 2.5 MG/3 ML (0.083 %) SOLUTION FOR NEBULIZATION: 2.5000 mg | INHALATION_SOLUTION | Freq: Four times a day (QID) | RESPIRATORY_TRACT | Status: DC | PRN

## 2023-12-19 MED ORDER — FUROSEMIDE 40 MG TABLET
40.0000 mg | ORAL_TABLET | Freq: Every day | ORAL | Status: DC
  Administered 2023-12-20 – 2023-12-27 (×8): 40 mg via ORAL
  Filled 2023-12-19 (×8): qty 1

## 2023-12-19 MED ORDER — ONDANSETRON HCL (PF) 4 MG/2 ML INJECTION SOLUTION
4.0000 mg | Freq: Three times a day (TID) | INTRAMUSCULAR | Status: DC | PRN
  Administered 2023-12-19 – 2023-12-21 (×2): 4 mg via INTRAVENOUS
  Filled 2023-12-19 (×2): qty 2

## 2023-12-19 MED ORDER — SODIUM CHLORIDE 0.9 % (FLUSH) INJECTION SYRINGE
3.0000 mL | INJECTION | Freq: Three times a day (TID) | INTRAMUSCULAR | Status: DC
  Administered 2023-12-19 – 2023-12-20 (×3): 0 mL
  Administered 2023-12-20: 3 mL
  Administered 2023-12-21: 0 mL
  Administered 2023-12-21: 3 mL
  Administered 2023-12-21 – 2023-12-22 (×2): 0 mL
  Administered 2023-12-22: 3 mL
  Administered 2023-12-22 – 2023-12-23 (×2): 0 mL
  Administered 2023-12-23 – 2023-12-25 (×7): 3 mL
  Administered 2023-12-25 – 2023-12-26 (×4): 0 mL
  Administered 2023-12-27: 3 mL
  Administered 2023-12-27: 0 mL

## 2023-12-19 MED ORDER — GLUCAGON 1 MG SOLUTION FOR INJECTION: 1.0000 mg | Freq: Once | INTRAMUSCULAR | Status: DC | PRN

## 2023-12-19 MED ORDER — DEXTROSE 50 % IN WATER (D50W) INTRAVENOUS SYRINGE: 12.5000 g | INJECTION | INTRAVENOUS | Status: DC | PRN

## 2023-12-19 MED ORDER — INSULIN GLARGINE 100 UNITS/ML SUBQ - CHARGE BY DOSE
45.0000 [IU] | Freq: Every evening | SUBCUTANEOUS | Status: DC
Start: 2023-12-19 — End: 2023-12-19

## 2023-12-19 MED ORDER — POLYETHYLENE GLYCOL 3350 17 GRAM ORAL POWDER PACKET
17.0000 g | Freq: Every day | ORAL | Status: DC
Start: 2023-12-19 — End: 2023-12-19
  Administered 2023-12-19: 0 g via ORAL

## 2023-12-19 MED ORDER — INSULIN LISPRO 100 UNIT/ML SUB-Q SSIP VIAL
4.0000 [IU] | INJECTION | Freq: Four times a day (QID) | SUBCUTANEOUS | Status: DC
  Administered 2023-12-19 (×2): 15 [IU] via SUBCUTANEOUS
  Administered 2023-12-20: 18 [IU] via SUBCUTANEOUS
  Administered 2023-12-20: 0 [IU] via SUBCUTANEOUS
  Administered 2023-12-20: 11 [IU] via SUBCUTANEOUS
  Administered 2023-12-20: 18 [IU] via SUBCUTANEOUS
  Administered 2023-12-21 (×2): 15 [IU] via SUBCUTANEOUS
  Administered 2023-12-21: 8 [IU] via SUBCUTANEOUS
  Administered 2023-12-21 – 2023-12-22 (×3): 18 [IU] via SUBCUTANEOUS
  Administered 2023-12-22: 11 [IU] via SUBCUTANEOUS
  Administered 2023-12-22: 7 [IU] via SUBCUTANEOUS
  Administered 2023-12-23 – 2023-12-24 (×7): 18 [IU] via SUBCUTANEOUS
  Administered 2023-12-24: 14 [IU] via SUBCUTANEOUS
  Administered 2023-12-25: 18 [IU] via SUBCUTANEOUS
  Administered 2023-12-25: 14 [IU] via SUBCUTANEOUS
  Administered 2023-12-25: 18 [IU] via SUBCUTANEOUS
  Administered 2023-12-25: 14 [IU] via SUBCUTANEOUS
  Administered 2023-12-26: 18 [IU] via SUBCUTANEOUS
  Administered 2023-12-26: 14 [IU] via SUBCUTANEOUS
  Administered 2023-12-26 – 2023-12-27 (×3): 18 [IU] via SUBCUTANEOUS
  Administered 2023-12-27: 13 [IU] via SUBCUTANEOUS
  Administered 2023-12-27: 10 [IU] via SUBCUTANEOUS
  Filled 2023-12-19: qty 10
  Filled 2023-12-19: qty 18
  Filled 2023-12-19: qty 15
  Filled 2023-12-19 (×2): qty 18
  Filled 2023-12-19 (×2): qty 11
  Filled 2023-12-19: qty 14
  Filled 2023-12-19: qty 15
  Filled 2023-12-19 (×3): qty 18
  Filled 2023-12-19: qty 14
  Filled 2023-12-19 (×4): qty 18
  Filled 2023-12-19: qty 14
  Filled 2023-12-19 (×3): qty 18
  Filled 2023-12-19: qty 15
  Filled 2023-12-19: qty 18
  Filled 2023-12-19: qty 8
  Filled 2023-12-19: qty 14

## 2023-12-19 MED ORDER — PANTOPRAZOLE 40 MG TABLET,DELAYED RELEASE
40.0000 mg | DELAYED_RELEASE_TABLET | Freq: Two times a day (BID) | ORAL | Status: DC
Start: 2023-12-19 — End: 2023-12-22
  Administered 2023-12-19 – 2023-12-21 (×5): 40 mg via ORAL
  Filled 2023-12-19 (×5): qty 1

## 2023-12-19 MED ORDER — INSULIN GLARGINE 100 UNITS/ML SUBQ - CHARGE BY DOSE
50.0000 [IU] | Freq: Every evening | SUBCUTANEOUS | Status: DC
Start: 2023-12-19 — End: 2023-12-19

## 2023-12-19 MED ORDER — SODIUM CHLORIDE 0.9 % INTRAVENOUS SOLUTION
6.0000 mg/kg | INTRAVENOUS | Status: DC
Start: 2023-12-19 — End: 2023-12-19

## 2023-12-19 MED ORDER — NICOTINE (POLACRILEX) 2 MG BUCCAL LOZENGE
4.0000 mg | LOZENGE | BUCCAL | Status: DC | PRN
  Administered 2023-12-21 (×2): 4 mg via ORAL
  Filled 2023-12-19: qty 4
  Filled 2023-12-19 (×2): qty 2

## 2023-12-19 MED ORDER — IPRATROPIUM BROMIDE 0.02 % SOLUTION FOR INHALATION
0.2500 mg | Freq: Four times a day (QID) | RESPIRATORY_TRACT | Status: DC
  Administered 2023-12-19 – 2023-12-20 (×2): 0.25 mg via RESPIRATORY_TRACT
  Administered 2023-12-20: 0 mg via RESPIRATORY_TRACT
  Administered 2023-12-20: 0.25 mg via RESPIRATORY_TRACT
  Administered 2023-12-20: 0 mg via RESPIRATORY_TRACT
  Administered 2023-12-21 – 2023-12-22 (×6): 0.25 mg via RESPIRATORY_TRACT
  Administered 2023-12-22: 0 mg via RESPIRATORY_TRACT
  Administered 2023-12-22 – 2023-12-24 (×6): 0.25 mg via RESPIRATORY_TRACT
  Administered 2023-12-24: 0 mg via RESPIRATORY_TRACT
  Administered 2023-12-24: 0.25 mg via RESPIRATORY_TRACT
  Administered 2023-12-24: 0 mg via RESPIRATORY_TRACT
  Administered 2023-12-25 (×4): 0.25 mg via RESPIRATORY_TRACT
  Administered 2023-12-26: 0 mg via RESPIRATORY_TRACT
  Administered 2023-12-26 – 2023-12-27 (×6): 0.25 mg via RESPIRATORY_TRACT
  Filled 2023-12-19: qty 1

## 2023-12-19 MED ORDER — ACETAMINOPHEN 325 MG TABLET
650.0000 mg | ORAL_TABLET | Freq: Four times a day (QID) | ORAL | Status: DC | PRN
  Administered 2023-12-19: 0 mg via ORAL
  Administered 2023-12-20 – 2023-12-23 (×2): 650 mg via ORAL
  Filled 2023-12-19 (×3): qty 2

## 2023-12-19 MED ORDER — LINACLOTIDE 290 MCG CAPSULE
290.0000 ug | ORAL_CAPSULE | Freq: Every morning | ORAL | Status: DC
  Administered 2023-12-20: 0 ug via ORAL
  Administered 2023-12-21 – 2023-12-27 (×7): 290 ug via ORAL
  Filled 2023-12-19 (×9): qty 1

## 2023-12-19 MED ORDER — SENNOSIDES 8.6 MG-DOCUSATE SODIUM 50 MG TABLET: 1.0000 | ORAL_TABLET | Freq: Every evening | ORAL | Status: DC | PRN

## 2023-12-19 MED ORDER — CLONAZEPAM 0.5 MG TABLET
0.5000 mg | ORAL_TABLET | Freq: Two times a day (BID) | ORAL | Status: DC
Start: 2023-12-19 — End: 2023-12-22
  Administered 2023-12-19 – 2023-12-22 (×7): 0.5 mg via ORAL
  Filled 2023-12-19 (×7): qty 1

## 2023-12-19 MED ORDER — ACETAMINOPHEN 325 MG TABLET
650.0000 mg | ORAL_TABLET | ORAL | Status: AC
Start: 2023-12-19 — End: 2023-12-19
  Administered 2023-12-19: 650 mg via ORAL

## 2023-12-19 MED ORDER — ROSUVASTATIN 20 MG TABLET
20.0000 mg | ORAL_TABLET | Freq: Every evening | ORAL | Status: DC
  Administered 2023-12-19 – 2023-12-26 (×8): 20 mg via ORAL
  Filled 2023-12-19 (×8): qty 1

## 2023-12-19 MED ORDER — INSULIN GLARGINE 100 UNITS/ML SUBQ - CHARGE BY DOSE
50.0000 [IU] | Freq: Every evening | SUBCUTANEOUS | Status: DC
Start: 2023-12-19 — End: 2023-12-20
  Administered 2023-12-19: 50 [IU] via SUBCUTANEOUS
  Filled 2023-12-19: qty 50

## 2023-12-19 MED ORDER — SODIUM CHLORIDE 0.9% FLUSH BAG - 250 ML: INTRAVENOUS | Status: DC | PRN

## 2023-12-19 MED ORDER — NICOTINE 14 MG/24 HR DAILY TRANSDERMAL PATCH
14.0000 mg | MEDICATED_PATCH | TRANSDERMAL | Status: DC
Start: 2023-12-19 — End: 2023-12-23
  Administered 2023-12-19 – 2023-12-22 (×4): 14 mg via TRANSDERMAL
  Filled 2023-12-19 (×4): qty 1

## 2023-12-19 MED ORDER — BUDESONIDE 0.5 MG/2 ML SUSPENSION FOR NEBULIZATION
0.5000 mg | INHALATION_SUSPENSION | Freq: Two times a day (BID) | RESPIRATORY_TRACT | Status: DC
  Administered 2023-12-19 – 2023-12-20 (×2): 0.5 mg via RESPIRATORY_TRACT
  Administered 2023-12-20: 0 mg via RESPIRATORY_TRACT
  Administered 2023-12-21 – 2023-12-27 (×13): 0.5 mg via RESPIRATORY_TRACT

## 2023-12-19 NOTE — H&P (Signed)
 [facility]  Division of Hospital Medicine  Admission H&P    Date of Service:  12/19/2023  Yvette Schmidt, Yvette Schmidt, 48 y.o. female  Date of Admission:  12/19/2023  Date of Birth:  11-15-1975  PCP: Yvette Carmell Puller, MD    LAY CAREGIVER   Appointed Lay Caregiver?: I Decline     Information Obtained from: patient  Chief Complaint:  Right foot infection    HPI: Yvette Schmidt is a 48 y.o., female with a history of uncontrolled diabetes who presented to outpatient today for hospital follow-up in the vascular Center.  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  Excisional debridement of the tendon as well.  After surgery at the time the patient required wound VAC therapy but per chart review the patient refused home health services as well as wound VAC therapy.  The patient at the office studies endorsing significant pain within the wound bed, plantar foot is discolored and painful as well.  She was admitted as a direct admit for further debridement via vascular surgery.  Upon interview with the patient the patient reports that she is anxious, tearful that she is here, requesting a regular diet, and reports he is going to drink regular Coke.    PAST MEDICAL:    Past Medical History:   Diagnosis Date    Asthma     COPD (chronic obstructive pulmonary disease)     Diabetes mellitus, type 2     Gastroparesis     GERD (gastroesophageal reflux disease)     Hypertension     MRSA (methicillin resistant staph aureus) culture positive 10/10/2022    Right buttock wound        Past Surgical History:   Procedure Laterality Date    HX HYSTERECTOMY  2012    Left ooperectomy, R. ovary retained    HX LAP CHOLECYSTECTOMY  05/12/2017    PB COLONOSCOPY,DIAGNOSTIC              Medications Prior to Admission       Prescriptions    ACCU-CHEK GUIDE GLUCOSE METER Does not apply Misc     Use three times daily with lancets and test strips    albuterol  sulfate (PROVENTIL ) 2.5 mg /3 mL (0.083 %) Inhalation nebulizer solution    Inhale 3 mL (2.5 mg total) Via Nebulizer Every 6 hours as needed for Wheezing    albuterol  sulfate (VENTOLIN  HFA) 90 mcg/actuation Inhalation oral inhaler    INHALE 1-2 PUFFS BY INHALATION EVERY 6 HOURS AS NEEDED    amitriptyline  (ELAVIL ) 50 mg Oral Tablet    Take 1 Tablet (50 mg total) by mouth Every night for 90 days    aspirin  81 mg Oral Tablet, Chewable    Chew 1 Tablet (81 mg total) Daily    azelastine  (ASTELIN ) 137 mcg (0.1 %) Nasal Spray, Non-Aerosol    ADMINISTER 1 SPRAY INTO EACH NOSTRIL TWICE DAILY FOR 30 DAYS    betamethasone  valerate (VALISONE ) 0.1 % Ointment    Apply topically Twice daily    Blood Sugar Diagnostic (ACCU-CHEK GUIDE TEST STRIPS) Does not apply Strip    USE 1 STRIP TO CHECK BLOOD SUGAR FOUR TIMES DAILY BEFORE MEALS AND AT BEDTIME    cetirizine  (ZYRTEC ) 10 mg Oral Tablet    Take 1 Tablet (10 mg total) by mouth Daily for 90 days  clonazePAM  (KLONOPIN ) 0.5 mg Oral Tablet    Take 1 Tablet (0.5 mg total) by mouth Twice daily    EPINEPHrine  0.3 mg/0.3 mL Injection Auto-Injector    Inject 0.3 mL (0.3 mg total) into the muscle Once, as needed for up to 2 doses    fluticasone  propionate (FLONASE ) 50 mcg/actuation Nasal Spray, Suspension    Administer 2 Sprays into each nostril Twice daily    furosemide  (LASIX ) 40 mg Oral Tablet    Take 1 Tablet (40 mg total) by mouth Once a day    gabapentin  (NEURONTIN ) 800 mg Oral Tablet    Take 1/2 tablet by mouth in the morning and afternoon, then take 1 tablet in the evening.    Ibuprofen  (MOTRIN ) 200 mg Oral Tablet    Take 4 Tablets (800 mg total) by mouth Four times a day as needed for Pain    insulin  aspart U-100 (NOVOLOG ) 100 unit/mL (3 mL) Subcutaneous Insulin  Pen    Inject under the skin Twice a day before meals per Sliding Scale. <150 No injection, 150-200 2 units, 201-250 4 units, 251-300 6 units, 301-350 9 units,  351-400 12 units    Insulin  Needles, Disposable, (SURE COMFORT PEN NEEDLE) 32 gauge x 1/4 Needle    USE TO INJECT INSULIN  TWICE DAILY    ipratropium (ATROVENT ) 0.02 % Inhalation Solution    Take 1.25 mL (0.25 mg total) by nebulization Every 6 hours    lactulose  (ENULOSE ) 10 gram/15 mL Oral Solution    Take 30 mL by mouth Daily for 30 days    Patient not taking:  Reported on 12/19/2023    Lancets (ACCU-CHEK SOFTCLIX LANCETS) Misc    Use to Check blood Sugar Three Times Daily    linaCLOtide  (LINZESS ) 290 mcg Oral Capsule    Take 1 Capsule (290 mcg total) by mouth Every morning    Patient taking differently:  Take 1 Capsule (290 mcg total) by mouth Every other day    meloxicam  (MOBIC ) 15 mg Oral Tablet    TAKE 1 TABLET BY MOUTH EVERY DAY AT BEDTIME    mineral oil-hydrophil petrolat (AQUAPHOR) Ointment    Apply topically Four times a day as needed    mupirocin  (BACTROBAN ) 2 % Ointment    Apply topically Three times a day for 30 days    Patient not taking:  Reported on 12/19/2023    Nebulizers Misc    Use with Nebulizer medication    oxyCODONE  (ROXICODONE ) 5 mg Oral Tablet    Take 2 Tablets (10 mg total) by mouth Every 4 hours as needed for up to 3 days    pantoprazole  (PROTONIX ) 40 mg Oral Tablet, Delayed Release (E.C.)    Take 1 Tablet (40 mg total) by mouth Twice daily    polyethylene glycol (MIRALAX ) 17 gram Oral Powder in Packet    Take 1 Packet (17 g total) by mouth Once per day as needed for up to 30 days    rosuvastatin  (CRESTOR ) 20 mg Oral Tablet    Take 1 Tablet (20 mg total) by mouth Every evening    saccharomyces boulardii (FLORASTOR) capsule    sennosides-docusate sodium  (SENOKOT-S) 8.6-50 mg Oral Tablet    Take 1 Tablet by mouth Twice daily for 30 days    simethicone  (MYLICON) 80 mg Oral Tablet, Chewable    Chew 1 Tablet (80 mg total) Every 6 hours as needed for up to 30 days    Patient not taking:  Reported on 12/19/2023  SITagliptin  phosphate (JANUVIA ) 100 mg Oral Tablet    Take 1 Tablet (100 mg total)  by mouth Once a day    SUMAtriptan  (IMITREX ) 50 mg Oral Tablet    TAKE 1 TABLET BY MOUTH ONCE DAILY AS NEEDED FOR MIGRAINES MAY REPEAT IN 2 HOURS IF NEEDED FOR 30 DAYS DO NOT EXCEED MORE THAN 9 DOSES IN A 30 DAY PERIOD    SYMBICORT  160-4.5 mcg/actuation Inhalation oral inhaler    TAKE 2 PUFFS BY INHALATION TWICE DAILY    vitamin B complex  Oral Tablet    Take 1 Tablet by mouth Daily          Allergies[1]      Family History  N/A      Social History  Social History     Tobacco Use    Smoking status: Every Day     Current packs/day: 0.50     Average packs/day: 0.5 packs/day for 15.0 years (7.5 ttl pk-yrs)     Types: Cigarettes    Smokeless tobacco: Never   Substance Use Topics    Alcohol use: Not Currently     Comment: seldom          ROS: Other than ROS in the HPI, all other systems were negative.      Examination:  Temperature: 37.1 C (98.8 F) Heart Rate: 98 BP (Non-Invasive): (!) 142/93   Respiratory Rate: 18 SpO2: 94 %       General: 48 y.o. female  in no acute distress  HEENT:Normocephalic, atraumatic.  Cardiovascular:  Regular rate  Respiratory:  No increased work of breathing  GI:  Distended  Extremities:  Right foot wrapped in bandage, medial files reviewed  Neurological: Awake, alert.     Labs:  (I have review all labs available. See A/P for my assessment of lab data)  CBC (Last 24 Hours):    Recent Results last 24 hours     12/19/23  1037   WBC 10.7   HGB 15.6   HCT 47.3*   MCV 93.1   PLTCNT 232         Basic Metabolic Profile    Lab Results   Component Value Date/Time    SODIUM 133 (L) 12/19/2023 11:02 AM    POTASSIUM 3.9 12/19/2023 11:02 AM    CHLORIDE 98 12/19/2023 11:02 AM    CO2 25 12/19/2023 11:02 AM    ANIONGAP 10 12/19/2023 11:02 AM    Lab Results   Component Value Date/Time    BUN 9 12/19/2023 11:02 AM    CREATININE 0.64 12/19/2023 11:02 AM    GLUCOSENF 140 (H) 05/26/2016 07:42 AM            Hepatic Function    Lab Results   Component Value Date/Time    ALBUMIN 2.7 (L) 12/07/2023 08:46 AM     TOTALPROTEIN 7.2 12/06/2023 06:23 AM    ALKPHOS 143 (H) 12/06/2023 06:23 AM    PROTHROMTME 10.8 05/26/2016 07:42 AM    INR 1.10 12/02/2023 11:38 PM    Lab Results   Component Value Date/Time    AST 22 12/06/2023 06:23 AM    ALT 12 12/06/2023 06:23 AM    BILIRUBINCON 0.1 05/26/2016 07:42 AM              Imaging Studies:  (I have independently reviewed all imaging and tests available. See below for interpretation of imaging studies)   No orders to display       Consults: I have personally reviewed  the notes of all consulting physicians, relevant previous outside providers, emergency room/referring providers.   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 status post surgical intervention  -status post surgical intervention open right 5th ray amputation with drainage of abscess and washout and open packing on 11/13   -the patient received Dalvance  yesterday on 11/25 no need to cover Gram-positive coverage at this time  -vascular consulted   -infectious disease consulted  -continue oral antibiotics with ciprofloxacin  and Flagyl   -NPO for plans for OR tomorrow    History of cardiac diastolic dysfunction  Questionable CAD  -monitor IO as patient appears euvolemic  -continue aspirin   -continue Lasix     Uncontrolled type 2 diabetes mellitus complicated by peripheral neuropathy  -sugars on admission over 400  -glargine 50  -SSI aggressive  -adjust insulin  regimen as necessary   -continue gabapentin  b.i.d.    Chronic Obstructive Pulmonary Disease  -continue Pulmicort  and Brovana     Hypertension  -continue Lasix    -may benefit from Ace Arb given diabetic, monitor blood pressures and adjust as  necessary    Anxiety  -Klonopin      Tobacco use disorder   -nicotine  patch p.r.n.   -nicotine  lozenge    History of constipation   -continue Linzess , lactulose     gastroesophageal reflux disease   -Protonix     Primary admission Problem:  Osteomyelitis of the right foot Severe Exacerbation      DVT/PE Prophylaxis: Heparin     I, as the attending physician, have completed the completed the assessment of this patient and my medical decision making process deems that this patient meets appropriate criteria to be admitted to the inpatient hospital for management of the above listed conditions and pathology.    INITIAL H&P LEVEL 3 (TOTAL TIME > 75 MINUTES) (00776)    On 12/19/2023 I spent a total visit time of 85 minutes. Time included review of tests and ordering tests, obtaining/reviewing history, examining the patient, communicating with consultants, documenting clinical information and counseling the patient and/or family regarding the diagnosis and management plan and coordination of care involved services directly related to patient care.    Tita Cram, MD    This note may have been partially generated using MModal Fluency Direct system, and there may be some incorrect words, spellings, and punctuation that were not noted in checking the note before saving.           [1]   Allergies  Allergen Reactions    Beeswax      Allergic to Bee Stings    Hydrocodone     Other      Nutrasweet, sugar subs    Splenda [Sucralose]      ALL SUGAR SUBSTITUTES      Sulfa (Sulfonamides)      HIVES    Theophylline      ANXIETY

## 2023-12-19 NOTE — Progress Notes (Signed)
 [facility]  [address]  [place] [id number]  Phone: [phone]  Fax: [phone]      Encounter Date: 12/19/2023    Patient ID:  Yvette Schmidt  FMW:[id number]    DOB: 1975-09-21  Age: 48 y.o. female    Subjective:     Chief Complaint   Patient presents with    Post Op     Room 3= Post Op, debridement and 5th toe amputation on right foot       HPI  [name] is a 48 y.o. female who was seen today for hospital follow up. 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 Patient is status post open right foot 5th ray amputation and drainage of right foot complex abscess. Excisional debridement to tendon. Patient was admitted afterward for wound vac therapy. Per review of the chart patient refused home health services as well as wound vac therapy. Patient is endorsing significant pain within wound bed. Plantar foot is discolored and painful as well. Patient is tearful and upset today. She is upset about her wound.     Current Outpatient Medications   Medication Sig    ACCU-CHEK GUIDE GLUCOSE METER Does not apply Misc Use three times daily with lancets and test strips    albuterol  sulfate (PROVENTIL ) 2.5 mg /3 mL (0.083 %) Inhalation nebulizer solution Inhale 3 mL (2.5 mg total) Via Nebulizer Every 6 hours as needed for Wheezing    albuterol  sulfate (VENTOLIN  HFA) 90 mcg/actuation Inhalation oral inhaler INHALE 1-2 PUFFS BY INHALATION EVERY 6 HOURS AS NEEDED    amitriptyline  (ELAVIL ) 50 mg Oral Tablet Take 1 Tablet (50 mg total) by mouth Every night for 90 days    aspirin  81 mg Oral Tablet, Chewable Chew 1 Tablet (81 mg total) Daily    azelastine  (ASTELIN ) 137 mcg (0.1 %) Nasal Spray, Non-Aerosol ADMINISTER 1 SPRAY INTO EACH NOSTRIL TWICE DAILY FOR 30 DAYS    betamethasone  valerate (VALISONE ) 0.1 % Ointment Apply topically Twice daily    Blood Sugar Diagnostic (ACCU-CHEK GUIDE TEST STRIPS) Does not apply Strip USE 1 STRIP TO CHECK BLOOD  SUGAR FOUR TIMES DAILY BEFORE MEALS AND AT BEDTIME    clonazePAM  (KLONOPIN ) 0.5 mg Oral Tablet Take 1 Tablet (0.5 mg total) by mouth Twice daily    EPINEPHrine  0.3 mg/0.3 mL Injection Auto-Injector Inject 0.3 mL (0.3 mg total) into the muscle Once, as needed for up to 2 doses    fluticasone  propionate (FLONASE ) 50 mcg/actuation Nasal Spray, Suspension Administer 2 Sprays into each nostril Twice daily    furosemide  (LASIX ) 40 mg Oral Tablet Take 1 Tablet (40 mg total) by mouth Once a day    gabapentin  (NEURONTIN ) 800 mg Oral Tablet Take 1/2 tablet by mouth in the morning and afternoon, then take 1 tablet in the evening.    Ibuprofen  (MOTRIN ) 200 mg Oral Tablet Take 4 Tablets (800 mg total) by mouth Four times a day as needed for Pain    insulin  aspart U-100 (NOVOLOG ) 100 unit/mL (3 mL) Subcutaneous Insulin  Pen Inject under the skin Twice a day before meals per Sliding Scale. <150 No injection, 150-200 2 units, 201-250 4 units, 251-300 6 units, 301-350 9 units, 351-400 12 units    Insulin  Needles, Disposable, (SURE COMFORT PEN NEEDLE) 32 gauge x 1/4 Needle USE TO INJECT INSULIN  TWICE DAILY    ipratropium (ATROVENT ) 0.02 % Inhalation Solution Take 1.25 mL (0.25 mg total) by  nebulization Every 6 hours    lactulose  (ENULOSE ) 10 gram/15 mL Oral Solution Take 30 mL by mouth Daily for 30 days    Lancets (ACCU-CHEK SOFTCLIX LANCETS) Misc Use to Check blood Sugar Three Times Daily    linaCLOtide  (LINZESS ) 290 mcg Oral Capsule Take 1 Capsule (290 mcg total) by mouth Every morning    meloxicam  (MOBIC ) 15 mg Oral Tablet TAKE 1 TABLET BY MOUTH EVERY DAY AT BEDTIME    mineral oil-hydrophil petrolat (AQUAPHOR) Ointment Apply topically Four times a day as needed    mupirocin  (BACTROBAN ) 2 % Ointment Apply topically Three times a day for 30 days    Nebulizers Misc Use with Nebulizer medication    pantoprazole  (PROTONIX ) 40 mg Oral Tablet, Delayed Release (E.C.) Take 1 Tablet (40 mg total) by mouth Twice daily    rosuvastatin   (CRESTOR ) 20 mg Oral Tablet Take 1 Tablet (20 mg total) by mouth Every evening    sennosides-docusate sodium  (SENOKOT-S) 8.6-50 mg Oral Tablet Take 1 Tablet by mouth Twice daily for 30 days    simethicone  (MYLICON) 80 mg Oral Tablet, Chewable Chew 1 Tablet (80 mg total) Every 6 hours as needed for up to 30 days    SITagliptin  phosphate (JANUVIA ) 100 mg Oral Tablet Take 1 Tablet (100 mg total) by mouth Once a day    SUMAtriptan  (IMITREX ) 50 mg Oral Tablet TAKE 1 TABLET BY MOUTH ONCE DAILY AS NEEDED FOR MIGRAINES MAY REPEAT IN 2 HOURS IF NEEDED FOR 30 DAYS DO NOT EXCEED MORE THAN 9 DOSES IN A 30 DAY PERIOD    SYMBICORT  160-4.5 mcg/actuation Inhalation oral inhaler TAKE 2 PUFFS BY INHALATION TWICE DAILY    vitamin B complex  Oral Tablet Take 1 Tablet by mouth Daily     Allergies[1]    Past Medical History:   Diagnosis Date    Asthma     COPD (chronic obstructive pulmonary disease)     Diabetes mellitus, type 2     Gastroparesis     GERD (gastroesophageal reflux disease)     Hypertension     MRSA (methicillin resistant staph aureus) culture positive 10/10/2022    Right buttock wound         Past Surgical History:   Procedure Laterality Date    HX HYSTERECTOMY  2012    Left ooperectomy, R. ovary retained    HX LAP CHOLECYSTECTOMY  05/12/2017    PB COLONOSCOPY,DIAGNOSTIC           Family Medical History:    None         Social History[2]    Review of  Systems:  Constitutional Positive for: Malaise/Fatigue, Diaphoresis  Constitutional Negative for: Chills, Weight Loss, Weakness     Skin Negative for: Rash, Ulcers, Itching, Mass, Mole, Lesion, Changing lesion, Growing lesion, Painful lesion, Lesion drainage  HENT Positive for: Congestion, Stridor  HENT Negative for: Sore Throat, Nosebleeds, Ear Discharge, Ear Pain, Tinnitus, Hearing Loss, Headaches  Eyes Positive for: Blurred Vision  Eyes Negative for: Double Vision, Photophobia, Eye Pain, Eye Discharge, Eye Redness  Cardiovascular Positive for: Leg Swelling  Cardiovascular  Negative for: PND, Claudication, Orthopnea, Palpitations, Chest Pain  Respiratory Positive for: Cough, Shortness of Breath, Wheezing  Respiratory Negative for: Sputum Production, Hemoptysis     Gastrointestinal Negative for: Heartburn, Nausea, Vomiting, Abdominal Pain, Diarrhea, Constipation, Blood in Stool, Dysphagia, Melena  Genitourinary Positive for: Urgency, Frequency, Flank Pain  Genitourinary Negative for: Hematuria, Dysuria  Musculoskeletal: Back Pain, Joint Pain  Musculoskeletal Negative for:  Falls, Neck Pain, Myalgias  Endo/Heme/Allergy Positive for: Env Allergies, Polydipsia  Endo/Heme/Allergy Negative for: Easy Bruise/Bleed  Neurological Positive for: Tingling, Tremor  Neurological Negative for : Dizziness, Sensory Change, Speech Change, Focal Weakness, Seizures, LOC  Psychiatric Positive for: Nervous/Anxious, Insomnia  Psychological Negative for: Memory Loss, Substance Abuse, Hallucinations, Suicidal Ideas, Depression                          Objective:   Vitals: BP (!) 162/78 (Patient Position: Sitting)   Pulse (!) 108   Temp 36.3 C (97.4 F)   Ht 1.651 m (5' 5)   Wt 133 kg (293 lb 8 oz)   SpO2 93%   BMI 48.84 kg/m         General Exam:    General:  acutely ill  Lungs:  Breathing nonlabored  Cardiovascular:  regular rate and rhythm, S1, S2 normal, no murmur, click, rub or gallop  Extremities:  see picture  Neurologic: Grossly normal. Alert and oriented x3  Vascular    pulses 1+ throughout              Ancillary Tests Reviewed:          ENCOUNTER DIAGNOSES     ICD-10-CM   1. Diabetic foot infection (CMS HCC)  (CMS HCC)  E11.628    L08.9   2. Cellulitis and abscess of right leg  L03.115    L02.415       Assessment     Right diabetic foot infection, nonhealing   Concern for further infection and tissue necrosis           Plan     I had a very long and frank discussion with the patient regarding her care. I also discussed the case with Dr Adeniyi. Dr. Adeniyi is recommending patient go to [facility] ED  for second opinion. Other option is to admit patient for further right foot debridement ONLY IF patient is agreeable to short term rehab following procedure. After a long and heated discussion with the patient and her sister, patient is agreeable for surgical debridement and short term rehab stent. Patient's sister and husband were in the room for this conversation. Patient is refusing to go to ED for second opinion.  Patient will be direct admitted to hospitalist and go for surgical debridement tomorrow.       No orders of the defined types were placed in this encounter.      No follow-ups on file.    Minda Faas, APRN, CNP    This note was partially generated using MModal Fluency Direct system, and there may be some incorrect words, spellings, and punctuation that were not noted in checking the note before saving.    Patient was seen independently in clinic. The cosigning physician, Dr. Adeniyi, was available for consultation.    I spent a total of 60 min of time for today's visit. This included reviewing the medical record, obtaining the history, performing the physical exam, reviewing imaging studies, directly coordinating care with other providers, counseling on treatment options and their associated risks          [1]   Allergies  Allergen Reactions    Beeswax      Allergic to Bee Stings    Hydrocodone     Other      Nutrasweet, sugar subs    Splenda [Sucralose]      ALL SUGAR SUBSTITUTES      Sulfa (Sulfonamides)  HIVES    Theophylline      ANXIETY   [2]   Social History  Tobacco Use    Smoking status: Every Day     Current packs/day: 0.50     Average packs/day: 0.5 packs/day for 15.0 years (7.5 ttl pk-yrs)     Types: Cigarettes    Smokeless tobacco: Never   Substance Use Topics    Alcohol use: Not Currently     Comment: seldom    Drug use: Not Currently     Types: Marijuana     Comment: occasionally

## 2023-12-19 NOTE — Nurses Notes (Addendum)
 1050- Patient checked in. Treatment plan released. PIV placed and labs drawn. Manuelita Shivers, RN  1120- PIV flushed with 3 mL of NS. BR present. [place]  infusion started over 30 mins. Manuelita Shivers, RN  [phone]- Infusion complete. PIV flushed with 3 mL of NS and removed. Gauze and coban placed over site. Patient tolerated well. Left ambulatory with walker. Manuelita Shivers, RN  [phone] called with critical glucose result of 444. Patient had already been discharged. Called her cell phone and let her know. She said she would address it with her primary again because it has been high lately. Thanked us  for the info. Sari Northern, RN

## 2023-12-19 NOTE — Care Plan (Signed)
 Problem: Wound  Goal: Optimal Coping  Outcome: Ongoing (see interventions/notes)  Goal: Optimal Functional Ability  Outcome: Ongoing (see interventions/notes)  Goal: Absence of Infection Signs and Symptoms  Outcome: Ongoing (see interventions/notes)  Intervention: Prevent or Manage Infection  Recent Flowsheet Documentation  Taken 12/19/2023 2000 by Lucious DEL, GN  Fever Reduction/Comfort Measures:   lightweight clothing   lightweight bedding  Goal: Improved Oral Intake  Outcome: Ongoing (see interventions/notes)  Goal: Optimal Pain Control and Function  Outcome: Ongoing (see interventions/notes)  Intervention: Prevent or Manage Pain  Recent Flowsheet Documentation  Taken 12/19/2023 2000 by Lucious DEL, GN  Sleep/Rest Enhancement: awakenings minimized  Goal: Skin Health and Integrity  Outcome: Ongoing (see interventions/notes)  Goal: Optimal Wound Healing  Outcome: Ongoing (see interventions/notes)  Intervention: Promote Wound Healing  Recent Flowsheet Documentation  Taken 12/19/2023 2000 by Lucious DEL, GN  Sleep/Rest Enhancement: awakenings minimized     Problem: Adult Inpatient Plan of Care  Goal: Absence of Hospital-Acquired Illness or Injury  Outcome: Ongoing (see interventions/notes)  Intervention: Identify and Manage Fall Risk  Recent Flowsheet Documentation  Taken 12/19/2023 2000 by Lucious DEL, GN  Safety Promotion/Fall Prevention:   activity supervised   fall prevention program maintained   nonskid shoes/slippers when out of bed   safety round/check completed  Intervention: Prevent and Manage VTE (Venous Thromboembolism) Risk  Recent Flowsheet Documentation  Taken 12/19/2023 2000 by Lucious DEL, GN  VTE Prevention/Management: ambulation promoted  Goal: Optimal Comfort and Wellbeing  Outcome: Ongoing (see interventions/notes)  Goal: Rounds/Family Conference  Outcome: Ongoing (see interventions/notes)     Problem: Health Knowledge, Opportunity to Enhance (Adult,Obstetrics,Pediatric)  Goal: Knowledgeable about Health  Subject/Topic  Description: Patient will demonstrate the desired outcomes by discharge/transition of care.  Outcome: Ongoing (see interventions/notes)     Problem: Surgery Nonspecified  Goal: Absence of Bleeding  Outcome: Ongoing (see interventions/notes)  Goal: Effective Bowel Elimination  Outcome: Ongoing (see interventions/notes)  Goal: Fluid and Electrolyte Balance  Outcome: Ongoing (see interventions/notes)  Goal: Blood Glucose Level Within Target Range  Outcome: Ongoing (see interventions/notes)  Goal: Absence of Infection Signs and Symptoms  Outcome: Ongoing (see interventions/notes)  Intervention: Prevent or Manage Infection  Recent Flowsheet Documentation  Taken 12/19/2023 2000 by Lucious DEL, GN  Fever Reduction/Comfort Measures:   lightweight clothing   lightweight bedding  Goal: Anesthesia/Sedation Recovery  Outcome: Ongoing (see interventions/notes)  Intervention: Optimize Anesthesia Recovery  Recent Flowsheet Documentation  Taken 12/19/2023 2000 by Lucious DEL, GN  Safety Promotion/Fall Prevention:   activity supervised   fall prevention program maintained   nonskid shoes/slippers when out of bed   safety round/check completed  Goal: Optimal Pain Control and Function  Outcome: Ongoing (see interventions/notes)  Goal: Nausea and Vomiting Relief  Outcome: Ongoing (see interventions/notes)  Goal: Effective Urinary Elimination  Outcome: Ongoing (see interventions/notes)  Goal: Effective Oxygenation and Ventilation  Outcome: Ongoing (see interventions/notes)     Problem: Comorbidity Management  Goal: Maintenance of Asthma Control  Outcome: Ongoing (see interventions/notes)  Goal: Maintenance of COPD Symptom Control  Outcome: Ongoing (see interventions/notes)  Goal: Blood Glucose Level Within Target Range  Outcome: Ongoing (see interventions/notes)  Goal: Blood Pressure in Desired Range  Outcome: Ongoing (see interventions/notes)

## 2023-12-19 NOTE — Care Plan (Signed)
 Problem: Wound  Goal: Optimal Coping  Outcome: Ongoing (see interventions/notes)  Goal: Optimal Functional Ability  Outcome: Ongoing (see interventions/notes)  Goal: Absence of Infection Signs and Symptoms  Outcome: Ongoing (see interventions/notes)  Goal: Improved Oral Intake  Outcome: Ongoing (see interventions/notes)  Goal: Optimal Pain Control and Function  Outcome: Ongoing (see interventions/notes)  Goal: Skin Health and Integrity  Outcome: Ongoing (see interventions/notes)  Goal: Optimal Wound Healing  Outcome: Ongoing (see interventions/notes)     Problem: Adult Inpatient Plan of Care  Goal: Absence of Hospital-Acquired Illness or Injury  Outcome: Ongoing (see interventions/notes)  Goal: Optimal Comfort and Wellbeing  Outcome: Ongoing (see interventions/notes)  Goal: Rounds/Family Conference  Outcome: Ongoing (see interventions/notes)     Problem: Health Knowledge, Opportunity to Enhance (Adult,Obstetrics,Pediatric)  Goal: Knowledgeable about Health Subject/Topic  Description: Patient will demonstrate the desired outcomes by discharge/transition of care.  Outcome: Ongoing (see interventions/notes)  Intervention: Enhance Health Knowledge  Note:   Discussed smoking cessation, advised on the importance of quitting and the impact smoking has on patient's health.   Assessed willingness to quit 12-19-23  Educated patient on methods and skills for cessation  Offered medication management  Provided information on [employer] 1-800-QUIT-NOW         Problem: Surgery Nonspecified  Goal: Absence of Bleeding  Outcome: Ongoing (see interventions/notes)  Goal: Effective Bowel Elimination  Outcome: Ongoing (see interventions/notes)  Goal: Fluid and Electrolyte Balance  Outcome: Ongoing (see interventions/notes)  Goal: Blood Glucose Level Within Target Range  Outcome: Ongoing (see interventions/notes)  Goal: Absence of Infection Signs and Symptoms  Outcome: Ongoing (see interventions/notes)  Goal: Anesthesia/Sedation  Recovery  Outcome: Ongoing (see interventions/notes)  Goal: Optimal Pain Control and Function  Outcome: Ongoing (see interventions/notes)  Goal: Nausea and Vomiting Relief  Outcome: Ongoing (see interventions/notes)  Goal: Effective Urinary Elimination  Outcome: Ongoing (see interventions/notes)  Goal: Effective Oxygenation and Ventilation  Outcome: Ongoing (see interventions/notes)     Problem: Comorbidity Management  Goal: Maintenance of Asthma Control  Outcome: Ongoing (see interventions/notes)  Goal: Maintenance of COPD Symptom Control  Outcome: Ongoing (see interventions/notes)  Goal: Blood Glucose Level Within Target Range  Outcome: Ongoing (see interventions/notes)  Goal: Blood Pressure in Desired Range  Outcome: Ongoing (see interventions/notes)

## 2023-12-19 NOTE — Nurses Notes (Signed)
 Patient admitted to floor this evening. Remains free of falls. Patient is alert and oriented. Vital signs are stable. Blood sugar this evening was 404. Hospitalist notified and coverage given. An IV was placed by IV team. IV remains patent. Patient is currently resting in bed with call light in reach.     BP 126/73   Pulse 98   Temp 37.1 C (98.8 F)   Resp 18   Ht 1.651 m (5' 5)   Wt (!) 138 kg (303 lb 2.1 oz)   SpO2 94%   BMI 50.44 kg/m     I have reviewed this patients plan of care and will continue to monitor.     Morna Och, LPN

## 2023-12-19 NOTE — Care Plan (Signed)
 Problem: Health Knowledge, Opportunity to Enhance (Adult,Obstetrics,Pediatric)  Goal: Knowledgeable about Health Subject/Topic  Description: Patient will demonstrate the desired outcomes by discharge/transition of care.  Intervention: Enhance Health Knowledge  Note:   Discussed smoking cessation, advised on the importance of quitting and the impact smoking has on patient's health.   Assessed willingness to quit 12-19-23  Educated patient on methods and skills for cessation  Offered medication management  Provided information on [employer]

## 2023-12-19 NOTE — Nurses Notes (Signed)
 I have reviewed this patient's orders and plan of care.  Currently this patient meets requirements for a low to mid level of nursing care.    Tommie Raymond, RN

## 2023-12-20 ENCOUNTER — Encounter (HOSPITAL_COMMUNITY): Payer: Self-pay

## 2023-12-20 ENCOUNTER — Inpatient Hospital Stay (HOSPITAL_COMMUNITY): Payer: MEDICAID

## 2023-12-20 ENCOUNTER — Encounter (HOSPITAL_COMMUNITY): Admission: AD | Disposition: A | Payer: Self-pay | Source: Ambulatory Visit

## 2023-12-20 ENCOUNTER — Ambulatory Visit: Admit: 2023-12-20 | Payer: MEDICAID | Admitting: Vascular & Interventional Radiology

## 2023-12-20 DIAGNOSIS — F17200 Nicotine dependence, unspecified, uncomplicated: Secondary | ICD-10-CM

## 2023-12-20 DIAGNOSIS — Z79899 Other long term (current) drug therapy: Secondary | ICD-10-CM

## 2023-12-20 DIAGNOSIS — Z029 Encounter for administrative examinations, unspecified: Secondary | ICD-10-CM

## 2023-12-20 DIAGNOSIS — T8753 Necrosis of amputation stump, right lower extremity: Secondary | ICD-10-CM

## 2023-12-20 DIAGNOSIS — Z7982 Long term (current) use of aspirin: Secondary | ICD-10-CM

## 2023-12-20 LAB — CBC WITH DIFF
BASOPHIL #: <0.1 x10ˆ3/uL (ref <=0.20)
BASOPHIL %: 0.6 %
EOSINOPHIL #: 0.34 x10ˆ3/uL (ref <=0.50)
EOSINOPHIL %: 3.7 %
HCT: 44.5 % (ref 34.8–46.0)
HGB: 14.7 g/dL (ref 11.5–16.0)
IMMATURE GRANULOCYTE #: <0.1 x10ˆ3/uL (ref <0.10)
IMMATURE GRANULOCYTE %: 0.2 % (ref 0.0–1.0)
LYMPHOCYTE #: 3.03 x10ˆ3/uL (ref 1.00–4.80)
LYMPHOCYTE %: 32.6 %
MCH: 31.2 pg (ref 26.0–32.0)
MCHC: 33 g/dL (ref 31.0–35.5)
MCV: 94.5 fL (ref 78.0–100.0)
MONOCYTE #: 0.61 x10ˆ3/uL (ref 0.20–1.10)
MONOCYTE %: 6.6 %
MPV: 11.1 fL (ref 8.7–12.5)
NEUTROPHIL #: 5.23 x10ˆ3/uL (ref 1.50–7.70)
NEUTROPHIL %: 56.3 %
PLATELETS: 203 x10ˆ3/uL (ref 150–400)
RBC: 4.71 x10ˆ6/uL (ref 3.85–5.22)
RDW-CV: 15 % (ref 11.5–15.5)
WBC: 9.3 x10ˆ3/uL (ref 3.7–11.0)

## 2023-12-20 LAB — POC BLOOD GLUCOSE (RESULTS)
GLUCOSE, POC: 345 mg/dL — ABNORMAL HIGH (ref 70–110)
GLUCOSE, POC: 242 mg/dL — ABNORMAL HIGH (ref 70–110)
GLUCOSE, POC: 225 mg/dL — ABNORMAL HIGH (ref 70–110)
GLUCOSE, POC: 474 mg/dL — ABNORMAL HIGH (ref 70–110)
GLUCOSE, POC: 485 mg/dL — ABNORMAL HIGH (ref 70–110)

## 2023-12-20 LAB — BASIC METABOLIC PANEL
ANION GAP: 11 mmol/L (ref 4–13)
BUN/CREA RATIO: 15 (ref 6–22)
BUN: 9 mg/dL (ref 8–25)
CALCIUM: 8.6 mg/dL (ref 8.6–10.2)
CHLORIDE: 102 mmol/L (ref 96–111)
CO2 TOTAL: 23 mmol/L (ref 22–30)
CREATININE: 0.62 mg/dL (ref 0.60–1.05)
GLUCOSE: 312 mg/dL — ABNORMAL HIGH (ref 65–125)
POTASSIUM: 4.1 mmol/L (ref 3.5–5.1)
SODIUM: 136 mmol/L (ref 136–145)
eGFRcr - FEMALE: >90 mL/min/1.73mˆ2 (ref >=60)

## 2023-12-20 LAB — PT/INR: INR: 1.01 (ref 0.80–1.10)

## 2023-12-20 LAB — PHOSPHORUS: PHOSPHORUS: 3.8 mg/dL (ref 2.4–4.7)

## 2023-12-20 LAB — MAGNESIUM: MAGNESIUM: 1.8 mg/dL (ref 1.8–2.6)

## 2023-12-20 SURGERY — DEBRIDEMENT FOOT
Anesthesia: General | Site: Foot | Laterality: Right | Wound class: Dirty or Infected Wounds-Include old traumatic wounds

## 2023-12-20 MED ORDER — LACTATED RINGERS INTRAVENOUS SOLUTION
INTRAVENOUS | Status: DC | PRN
Start: 2023-12-20 — End: 2023-12-20

## 2023-12-20 MED ORDER — HYDROMORPHONE (PF) 0.5 MG/0.5 ML INJECTION SYRINGE
0.3000 mg | INJECTION | Freq: Once | INTRAMUSCULAR | Status: AC
Start: 2023-12-20 — End: 2023-12-20
  Administered 2023-12-20: 0.3 mg via INTRAVENOUS
  Filled 2023-12-20: qty 0.5

## 2023-12-20 MED ORDER — SUMATRIPTAN 25 MG TABLET
50.0000 mg | ORAL_TABLET | Freq: Once | ORAL | Status: AC | PRN
Start: 2023-12-20 — End: 2023-12-20
  Administered 2023-12-20: 50 mg via ORAL
  Filled 2023-12-20: qty 2

## 2023-12-20 MED ORDER — GLYCOPYRROLATE 0.2 MG/ML INJECTION SOLUTION
INTRAMUSCULAR | Status: AC
Start: 2023-12-20 — End: 2023-12-20
  Filled 2023-12-20: qty 1

## 2023-12-20 MED ORDER — PHENYLEPHRINE 100 MCG/ML IV DILUTION - FOR ANES
INJECTION | Freq: Once | INTRAVENOUS | Status: DC | PRN
Start: 2023-12-20 — End: 2023-12-20
  Administered 2023-12-20: 100 ug via INTRAVENOUS
  Administered 2023-12-20: 200 ug via INTRAVENOUS

## 2023-12-20 MED ORDER — PROCHLORPERAZINE EDISYLATE 10 MG/2 ML (5 MG/ML) INJECTION SOLUTION
5.0000 mg | Freq: Once | INTRAMUSCULAR | Status: DC | PRN
Start: 2023-12-20 — End: 2023-12-20

## 2023-12-20 MED ORDER — MIDAZOLAM 1 MG/ML INJECTION WRAPPER
INTRAMUSCULAR | Status: AC
Start: 2023-12-20 — End: 2023-12-20
  Filled 2023-12-20: qty 2

## 2023-12-20 MED ORDER — MAGNESIUM SULFATE 500 MG/ML (50 %) INJECTION SOLUTION
1.0000 g | INTRAVENOUS | Status: AC
Start: 2023-12-20 — End: 2023-12-20
  Administered 2023-12-20: 1 g via INTRAVENOUS
  Administered 2023-12-20: 0 g via INTRAVENOUS
  Filled 2023-12-20: qty 2

## 2023-12-20 MED ORDER — OXYCODONE 5 MG TABLET
5.0000 mg | ORAL_TABLET | ORAL | Status: DC | PRN
Start: 2023-12-20 — End: 2023-12-23
  Administered 2023-12-20 – 2023-12-23 (×10): 5 mg via ORAL
  Filled 2023-12-20 (×10): qty 1

## 2023-12-20 MED ORDER — FENTANYL (PF) 50 MCG/ML INJECTION SOLUTION
INTRAMUSCULAR | Status: AC
Start: 2023-12-20 — End: 2023-12-20
  Filled 2023-12-20: qty 2

## 2023-12-20 MED ORDER — FENTANYL (PF) 50 MCG/ML INJECTION WRAPPER
INJECTION | Freq: Once | INTRAMUSCULAR | Status: DC | PRN
Start: 2023-12-20 — End: 2023-12-20
  Administered 2023-12-20 (×2): 50 ug via INTRAVENOUS

## 2023-12-20 MED ORDER — ONDANSETRON HCL (PF) 4 MG/2 ML INJECTION SOLUTION
INTRAMUSCULAR | Status: AC
Start: 2023-12-20 — End: 2023-12-20
  Filled 2023-12-20: qty 2

## 2023-12-20 MED ORDER — HYDROXYZINE PAMOATE 50 MG CAPSULE
50.0000 mg | ORAL_CAPSULE | ORAL | Status: DC
Start: 2023-12-20 — End: 2023-12-20

## 2023-12-20 MED ORDER — DEXAMETHASONE SODIUM PHOSPHATE 4 MG/ML INJECTION SOLUTION
INTRAMUSCULAR | Status: AC
Start: 2023-12-20 — End: 2023-12-20
  Filled 2023-12-20: qty 1

## 2023-12-20 MED ORDER — GLYCOPYRROLATE 0.2 MG/ML INJECTION SOLUTION
Freq: Once | INTRAMUSCULAR | Status: DC | PRN
Start: 2023-12-20 — End: 2023-12-20
  Administered 2023-12-20: .1 mg via INTRAVENOUS

## 2023-12-20 MED ORDER — FENTANYL (PF) 50 MCG/ML INJECTION WRAPPER
25.0000 ug | INJECTION | INTRAMUSCULAR | Status: DC | PRN
Start: 2023-12-20 — End: 2023-12-20

## 2023-12-20 MED ORDER — HYDRALAZINE 20 MG/ML INJECTION SOLUTION
5.0000 mg | INTRAMUSCULAR | Status: DC | PRN
Start: 2023-12-20 — End: 2023-12-20

## 2023-12-20 MED ORDER — LACTATED RINGERS INTRAVENOUS SOLUTION
INTRAVENOUS | Status: DC
Start: 2023-12-20 — End: 2023-12-20

## 2023-12-20 MED ORDER — CLONAZEPAM 0.5 MG TABLET
0.5000 mg | ORAL_TABLET | Freq: Once | ORAL | Status: AC | PRN
Start: 2023-12-20 — End: 2023-12-21
  Administered 2023-12-21: 0.5 mg via ORAL
  Filled 2023-12-20: qty 1

## 2023-12-20 MED ORDER — IPRATROPIUM 0.5 MG-ALBUTEROL 3 MG (2.5 MG BASE)/3 ML NEBULIZATION SOLN
3.0000 mL | INHALATION_SOLUTION | Freq: Once | RESPIRATORY_TRACT | Status: DC | PRN
Start: 2023-12-20 — End: 2023-12-20

## 2023-12-20 MED ORDER — INSULIN LISPRO 100 UNIT/ML SUB-Q - CHARGE BY DOSE
15.0000 [IU] | SUBCUTANEOUS | Status: AC
Start: 2023-12-20 — End: 2023-12-20
  Administered 2023-12-20: 15 [IU] via SUBCUTANEOUS
  Filled 2023-12-20: qty 15

## 2023-12-20 MED ORDER — PROPOFOL 10 MG/ML IV BOLUS
INJECTION | Freq: Once | INTRAVENOUS | Status: DC | PRN
Start: 2023-12-20 — End: 2023-12-20
  Administered 2023-12-20: 150 mg via INTRAVENOUS

## 2023-12-20 MED ORDER — SODIUM CHLORIDE 0.9 % (FLUSH) INJECTION SYRINGE
3.0000 mL | INJECTION | Freq: Three times a day (TID) | INTRAMUSCULAR | Status: DC
Start: 2023-12-20 — End: 2023-12-20
  Administered 2023-12-20: 0 mL

## 2023-12-20 MED ORDER — SUGAMMADEX 100 MG/ML INTRAVENOUS SOLUTION
Freq: Once | INTRAVENOUS | Status: DC | PRN
Start: 2023-12-20 — End: 2023-12-20
  Administered 2023-12-20: 400 mg via INTRAVENOUS

## 2023-12-20 MED ORDER — SUGAMMADEX 100 MG/ML INTRAVENOUS SOLUTION
INTRAVENOUS | Status: AC
Start: 2023-12-20 — End: 2023-12-20
  Filled 2023-12-20: qty 4

## 2023-12-20 MED ORDER — ONDANSETRON HCL (PF) 4 MG/2 ML INJECTION SOLUTION
Freq: Once | INTRAMUSCULAR | Status: DC | PRN
Start: 2023-12-20 — End: 2023-12-20
  Administered 2023-12-20: 4 mg via INTRAVENOUS

## 2023-12-20 MED ORDER — ROCURONIUM 10 MG/ML INTRAVENOUS SYRINGE WRAPPER
INJECTION | Freq: Once | INTRAVENOUS | Status: DC | PRN
Start: 2023-12-20 — End: 2023-12-20
  Administered 2023-12-20: 5 mg via INTRAVENOUS
  Administered 2023-12-20: 25 mg via INTRAVENOUS

## 2023-12-20 MED ORDER — LIDOCAINE (PF) 100 MG/5 ML (2 %) INTRAVENOUS SYRINGE
INJECTION | Freq: Once | INTRAVENOUS | Status: DC | PRN
Start: 2023-12-20 — End: 2023-12-20
  Administered 2023-12-20: 100 mg via INTRAVENOUS

## 2023-12-20 MED ORDER — MIDAZOLAM 1 MG/ML INJECTION WRAPPER
Freq: Once | INTRAMUSCULAR | Status: DC | PRN
Start: 2023-12-20 — End: 2023-12-20
  Administered 2023-12-20: 2 mg via INTRAVENOUS

## 2023-12-20 MED ORDER — PROCHLORPERAZINE EDISYLATE 10 MG/2 ML (5 MG/ML) INJECTION SOLUTION
5.0000 mg | INTRAMUSCULAR | Status: AC
Start: 2023-12-20 — End: 2023-12-20
  Administered 2023-12-20: 5 mg via INTRAVENOUS
  Filled 2023-12-20: qty 2

## 2023-12-20 MED ORDER — INSULIN GLARGINE 100 UNITS/ML SUBQ - CHARGE BY DOSE
60.0000 [IU] | Freq: Every evening | SUBCUTANEOUS | Status: DC
Start: 2023-12-20 — End: 2023-12-21
  Administered 2023-12-20: 60 [IU] via SUBCUTANEOUS
  Filled 2023-12-20: qty 60

## 2023-12-20 MED ORDER — DIPHENHYDRAMINE 50 MG/ML INJECTION SOLUTION
25.0000 mg | INTRAMUSCULAR | Status: AC
Start: 2023-12-20 — End: 2023-12-20
  Administered 2023-12-20: 25 mg via INTRAVENOUS
  Filled 2023-12-20: qty 1

## 2023-12-20 MED ORDER — PROPOFOL 10 MG/ML IV BOLUS
INJECTION | INTRAVENOUS | Status: AC
Start: 2023-12-20 — End: 2023-12-20
  Filled 2023-12-20: qty 40

## 2023-12-20 MED ORDER — SUCCINYLCHOLINE 20 MG/ML INTRAVENOUS WRAPPER
INJECTION | Freq: Once | INTRAVENOUS | Status: DC | PRN
Start: 2023-12-20 — End: 2023-12-20
  Administered 2023-12-20: 200 mg via INTRAVENOUS

## 2023-12-20 MED ORDER — DEXMEDETOMIDINE 4 MCG/ML IV DILUTION
Freq: Once | INTRAMUSCULAR | Status: DC | PRN
Start: 2023-12-20 — End: 2023-12-20
  Administered 2023-12-20: 12 ug via INTRAVENOUS
  Administered 2023-12-20: 8 ug via INTRAVENOUS

## 2023-12-20 MED ORDER — DEXAMETHASONE SODIUM PHOSPHATE 4 MG/ML INJECTION SOLUTION
Freq: Once | INTRAMUSCULAR | Status: DC | PRN
Start: 2023-12-20 — End: 2023-12-20
  Administered 2023-12-20: 8 mg via INTRAVENOUS

## 2023-12-20 MED ORDER — HYDROMORPHONE (PF) 0.5 MG/0.5 ML INJECTION SYRINGE
0.5000 mg | INJECTION | INTRAMUSCULAR | Status: DC | PRN
Start: 2023-12-20 — End: 2023-12-20
  Administered 2023-12-20 (×2): 0.5 mg via INTRAVENOUS
  Filled 2023-12-20 (×2): qty 0.5

## 2023-12-20 MED ORDER — PHENOL 1.4 % MUCOSAL AEROSOL SPRAY
1.0000 | INHALATION_SPRAY | Status: DC | PRN
  Administered 2023-12-21: 1 via OROMUCOSAL
  Filled 2023-12-20 (×2): qty 177

## 2023-12-20 MED ORDER — OXYCODONE 5 MG TABLET
5.0000 mg | ORAL_TABLET | Freq: Once | ORAL | Status: AC
Start: 2023-12-20 — End: 2023-12-20
  Administered 2023-12-20: 5 mg via ORAL
  Filled 2023-12-20: qty 1

## 2023-12-20 SURGICAL SUPPLY — 23 items
BANDAGE 4.1YDX4.5IN 6 PLY HYPOALL COTTON LRG GAUZE WHT STRL LF  DISP (WOUND CARE SUPPLY) IMPLANT
BANDAGE ACE 5YDX6IN STRL ELAS CLIP METAL COTTON PLSTR COMPRESS WHT LF (WOUND CARE SUPPLY) ×2 IMPLANT
BLADE 15 BD RB-BCK CBNSTL SURG TISS STRL LF  DISP (SURGICAL CUTTING SUPPLIES) ×2 IMPLANT
CONTAINR CLICKSEAL 4OZ TRANSLUC SCREW CAP STRL BLU SPECI PNEUM TUBE SYS (SPECIMEN COLLECTION SUPPLIES) ×2 IMPLANT
CONTAINR PATH POLYPROP LEAK RST LID FREEZABLE 16OZ NONST LF (SPECIMEN COLLECTION SUPPLIES) ×2 IMPLANT
COVER RIGID STRL LF  CNVRT LIGHT HNDL PLASTIC DISP GRN (MED SURG SUPPLIES) ×2 IMPLANT
DRAPE U LF  STRL DISP SURG (DRAPE/PACKS/SHEETS/OR TOWEL) ×6 IMPLANT
ELECTRODE ESURG BLADE 2.75IN 3/32IN EDGE STRL .2IN DISP INSL STD SHAFT HEX LOCK LF (SURGICAL CUTTING SUPPLIES) ×2 IMPLANT
ELECTRODE PATIENT RTN 9FT VLAB C30- LB RM PHSV ACRL FOAM CORD NONIRRITATE NONSENSITIZE ADH STRP (SURGICAL CUTTING SUPPLIES) ×2 IMPLANT
GAUZE SURG 8X4IN RFDETECT PLASTIC COTTON RADOPQ 16 PLY STRL LF  DISP (WOUND CARE SUPPLY) IMPLANT
KIT RM TURNOVER STPC DISP (DRAPE/PACKS/SHEETS/OR TOWEL) ×2 IMPLANT
LABEL STRL CV_C3333UHCV 100ST/BX (MED SURG SUPPLIES) IMPLANT
PACK MINOR - ~~LOC~~ (CUSTOM TRAYS & PACK) ×2 IMPLANT
PAD ABDOMINAL 9X5IN LF  STRL DISP (WOUND CARE SUPPLY) IMPLANT
PAD DRESS 4X3IN MDCHC NONADH NWVN LF  STRL DISP WHT (WOUND CARE SUPPLY) ×2 IMPLANT
SEALANT TISS CLSR VISTASEAL FBRN HUMAN 4ML (WOUND CARE SUPPLY) IMPLANT
SMOKE PENCIL WITH EDGE ELECTRODE 15 FT (MED SURG SUPPLIES) ×2 IMPLANT
SOL SURG PREP 26ML DRPRP 74% ISPRP 0.7% IOD POVACRYLEX SLF CNTN APPL SKIN STRL PREOP (MED SURG SUPPLIES) ×2 IMPLANT
SPONGE GAUZE 4X4IN MDCHC COTTON 16 PLY USP TY VII LF  STRL DISP (WOUND CARE SUPPLY) IMPLANT
STKNT ORTHO 44X9IN PLSTR COTTON OTHRT IMPRV DRP MED STRL (ORTHOPEDICS (NOT IMPLANTS)) IMPLANT
STRIP 5YDX1IN COTTON IFRM WOUND CURAD NU G SLVG EDGE WOVEN RAVEL RST STRL LF  DISP (WOUND CARE SUPPLY) IMPLANT
STRIP 5YDX2IN IFRM COTTON GAUZE WOUND CURAD WOVEN STRL LF (WOUND CARE SUPPLY) IMPLANT
TRAY UNIVERSAL CARDIOVASCULAR ~~LOC~~ - UNITED HOSPITAL CTR. (CUSTOM TRAYS & PACK) ×2 IMPLANT

## 2023-12-20 NOTE — Anesthesia Transfer of Care (Signed)
 ANESTHESIA TRANSFER OF CARE   Yvette Schmidt is a 48 y.o. ,female, Weight: (!) 138 kg (303 lb 2.1 oz)   had Procedure(s):  DEBRIDEMENT FOOT RIGHT  performed  12/20/2023   Primary Service: Tita Cram, *    Past Medical History:   Diagnosis Date    Asthma     COPD (chronic obstructive pulmonary disease)     Diabetes mellitus, type 2     Gastroparesis     GERD (gastroesophageal reflux disease)     Hypertension     MRSA (methicillin resistant staph aureus) culture positive 10/10/2022    Right buttock wound      Allergy History as of 12/20/23       THEOPHYLLINE         Noted Status Severity Type Reaction    01/09/07   Active       Comments: ANXIETY               SULFA (SULFONAMIDES)         Noted Status Severity Type Reaction    01/09/07   Active       Comments: HIVES               HYDROCODONE         Noted Status Severity Type Reaction    12/20/23 0941 Linna Silvano LABOR, RN 12/12/15 Active Low  Itching    12/12/15 0448 Galvin Setter, RN 12/12/15 Active                 BEESWAX         Noted Status Severity Type Reaction    12/12/15 0449 Galvin Setter, RN 12/12/15 Active       Comments: Allergic to Bee Stings     12/12/15 0448 Galvin Setter, RN 12/12/15 Active                 OTHER         Noted Status Severity Type Reaction    12/12/15 0449 Galvin Setter, RN 12/12/15 Active       Comments: Nutrasweet, sugar subs               CLINDAMYCIN         Noted Status Severity Type Reaction    12/11/23 1124 Carlo Martinez, [place] 12/12/15 Deleted       12/12/15 0449 Galvin Setter, RN 12/12/15 Active                 SUCRALOSE         Noted Status Severity Type Reaction    12/06/23 0006 Celia Ledger, RN 12/06/23 Active       Comments: ALL SUGAR SUBSTITUTES                     I completed my transfer of care / handoff to the receiving personnel during which we discussed:  Access, Airway, All key/critical aspects of case discussed, Analgesia, Antibiotics, Expectation of post procedure,  Fluids/Product, Gave opportunity for questions and acknowledgement of understanding, Labs and PMHx      Post Location: PACU                                                             Last OR Temp: Temperature: 36.2 C (  97.2 F)      Airway:* No LDAs found *  Blood pressure 98/66, pulse 92, temperature 36.2 C (97.2 F), resp. rate 19, height 1.651 m (5' 5), weight (!) 138 kg (303 lb 2.1 oz), SpO2 94%.

## 2023-12-20 NOTE — Anesthesia Preprocedure Evaluation (Signed)
 ANESTHESIA PRE-OP EVALUATION  Planned Procedure: DEBRIDEMENT FOOT RIGHT (Right)  Review of Systems     anesthesia history negative     patient summary reviewed          Pulmonary   COPD, asthma and sleep apnea,  denies history of smoking and not a current smoker   Cardiovascular    Hypertension and ECG reviewed ,No peripheral edema, no CAD and no angina,        GI/Hepatic/Renal    GERD (no sx today) and well controlled no liver disease and no renal insufficiency        Endo/Other    morbid obesity, no hypothyroidism   type 2 diabetes/ controlled with insulin     Neuro/Psych/MS    headaches, anxiety, depression no seizures, no CVA       Cancer                        Physical Assessment      Airway       Mallampati: III      Neck ROM: full  Mouth Opening: fair.            Dental           (+) poor dentition           Pulmonary      (-) no rhonchi, no decreased breath sounds, no wheezes, no rales and no stridor     Cardiovascular        (-) no friction rub, carotid bruit is not present, no peripheral edema and no murmur     Other findings              Plan  ASA 3     Planned anesthesia type: general     general anesthesia with endotracheal tube intubation                    Intravenous induction       Anesthetic plan and risks discussed with patient  signed consent obtained          Patient's NPO status is appropriate for Anesthesia.           Plan discussed with CRNA.    (Nausea with sinus congestion)

## 2023-12-20 NOTE — Nurses Notes (Signed)
 Patient alert and oriented. Vs stable. Iv clean dry and intact. Free of falls, PRN pain medicine given, see MAR. OR dressing in place. Blood glucose monitored, insulin  coverage given. No other concerns at this time. I have reviewed this patient's orders and plan of care.  Currently this patient meets requirements for a low to mid level of nursing care.    Dorene Israel, RN

## 2023-12-20 NOTE — Care Plan (Signed)
 Problem: Wound  Goal: Optimal Coping  Outcome: Ongoing (see interventions/notes)  Goal: Optimal Functional Ability  Outcome: Ongoing (see interventions/notes)  Goal: Absence of Infection Signs and Symptoms  Outcome: Ongoing (see interventions/notes)  Intervention: Prevent or Manage Infection  Recent Flowsheet Documentation  Taken 12/20/2023 2006 by Lucious DEL, GN  Fever Reduction/Comfort Measures:   lightweight bedding   lightweight clothing  Goal: Improved Oral Intake  Outcome: Ongoing (see interventions/notes)  Goal: Optimal Pain Control and Function  Outcome: Ongoing (see interventions/notes)  Intervention: Prevent or Manage Pain  Recent Flowsheet Documentation  Taken 12/20/2023 2006 by Lucious DEL, GN  Sleep/Rest Enhancement: awakenings minimized  Goal: Skin Health and Integrity  Outcome: Ongoing (see interventions/notes)  Goal: Optimal Wound Healing  Outcome: Ongoing (see interventions/notes)  Intervention: Promote Wound Healing  Recent Flowsheet Documentation  Taken 12/20/2023 2006 by Lucious DEL, GN  Sleep/Rest Enhancement: awakenings minimized     Problem: Adult Inpatient Plan of Care  Goal: Absence of Hospital-Acquired Illness or Injury  Outcome: Ongoing (see interventions/notes)  Intervention: Identify and Manage Fall Risk  Recent Flowsheet Documentation  Taken 12/20/2023 2006 by Lucious DEL, GN  Safety Promotion/Fall Prevention:   activity supervised   fall prevention program maintained   nonskid shoes/slippers when out of bed   safety round/check completed  Intervention: Prevent and Manage VTE (Venous Thromboembolism) Risk  Recent Flowsheet Documentation  Taken 12/20/2023 2006 by Lucious DEL, GN  VTE Prevention/Management: ambulation promoted  Goal: Optimal Comfort and Wellbeing  Outcome: Ongoing (see interventions/notes)  Goal: Rounds/Family Conference  Outcome: Ongoing (see interventions/notes)     Problem: Health Knowledge, Opportunity to Enhance (Adult,Obstetrics,Pediatric)  Goal: Knowledgeable about Health  Subject/Topic  Description: Patient will demonstrate the desired outcomes by discharge/transition of care.  Outcome: Ongoing (see interventions/notes)     Problem: Surgery Nonspecified  Goal: Absence of Bleeding  Outcome: Ongoing (see interventions/notes)  Goal: Effective Bowel Elimination  Outcome: Ongoing (see interventions/notes)  Goal: Fluid and Electrolyte Balance  Outcome: Ongoing (see interventions/notes)  Goal: Blood Glucose Level Within Target Range  Outcome: Ongoing (see interventions/notes)  Goal: Absence of Infection Signs and Symptoms  Outcome: Ongoing (see interventions/notes)  Intervention: Prevent or Manage Infection  Recent Flowsheet Documentation  Taken 12/20/2023 2006 by Lucious DEL, GN  Fever Reduction/Comfort Measures:   lightweight bedding   lightweight clothing  Goal: Anesthesia/Sedation Recovery  Outcome: Ongoing (see interventions/notes)  Intervention: Optimize Anesthesia Recovery  Recent Flowsheet Documentation  Taken 12/20/2023 2006 by Lucious DEL, GN  Safety Promotion/Fall Prevention:   activity supervised   fall prevention program maintained   nonskid shoes/slippers when out of bed   safety round/check completed  Goal: Optimal Pain Control and Function  Outcome: Ongoing (see interventions/notes)  Goal: Nausea and Vomiting Relief  Outcome: Ongoing (see interventions/notes)  Goal: Effective Urinary Elimination  Outcome: Ongoing (see interventions/notes)  Goal: Effective Oxygenation and Ventilation  Outcome: Ongoing (see interventions/notes)     Problem: Comorbidity Management  Goal: Maintenance of Asthma Control  Outcome: Ongoing (see interventions/notes)  Goal: Maintenance of COPD Symptom Control  Outcome: Ongoing (see interventions/notes)  Goal: Blood Glucose Level Within Target Range  Outcome: Ongoing (see interventions/notes)  Goal: Blood Pressure in Desired Range  Outcome: Ongoing (see interventions/notes)

## 2023-12-20 NOTE — Progress Notes (Addendum)
 [facility]  Medicine Progress Note  FULL CODE: ATTEMPT RESUSCITATION/CPR    Yvette Schmidt  Date of service: 12/20/2023    LOS: 1   Subjective: patient seen post op. Denies chest pain, shortness of breath, abdominal pain.  Reports some lower extremity soreness. Had a headache this afternoon that was treated with a headache cocktail.  Plans for wound VAC tomorrow of the right lower extremity.      Filed Vitals:    12/20/23 1200 12/20/23 1217 12/20/23 1448 12/20/23 1545   BP:  135/84 133/84    Pulse:  83 88    Resp: 16 18 18 16    Temp:  36.8 C (98.2 F) 36.8 C (98.2 F)    SpO2:  92% 90%      Oxygen Device  SpO2: 90 %  Flow (L/min) (Oxygen Therapy): 6    I have reviewed the vitals.    Input/Output    Intake/Output Summary (Last 24 hours) at 12/20/2023 1558  Last data filed at 12/20/2023 1300  Gross per 24 hour   Intake 1660 ml   Output 3 ml   Net 1657 ml       acetaminophen  (TYLENOL ) tablet, 650 mg, Oral, Q6H PRN  albuterol  (PROVENTIL ) 2.5 mg / 3 mL (0.083%) neb solution, 2.5 mg, Nebulization, Q6H PRN  budesonide  (PULMICORT  RESPULES) 0.5 mg/2 mL nebulizer suspension, 0.5 mg, Nebulization, 2x/day   And  arformoterol  (BROVANA ) 15 mcg/2 mL nebulizer solution, 15 mcg, Nebulization, 2x/day  aspirin  chewable tablet 81 mg, 81 mg, Oral, Daily  ciprofloxacin  (CIPRO ) tablet, 500 mg, Oral, 2x/day  clonazePAM  (klonoPIN ) tablet, 0.5 mg, Oral, 2x/day  Correction/SSIP insulin  lispro 100 units/mL injection, 4-18 Units, Subcutaneous, 4x/day AC  D5W 250 mL flush bag, , Intravenous, Q15 Min PRN  dextrose  (GLUTOSE) 40% oral gel, 15 g, Oral, Q15 Min PRN  dextrose  50% (0.5 g/mL) injection - syringe, 12.5 g, Intravenous, Q15 Min PRN  furosemide  (LASIX ) tablet, 40 mg, Oral, Daily  gabapentin  (NEURONTIN ) capsule, 400 mg, Oral, 2x/day  glucagon  (GLUCAGEN) injection 1 mg, 1 mg, IntraMUSCULAR, Once PRN  heparin  10,000 units/mL injection, 5,000 Units, Subcutaneous, Q8HRS  insulin  glargine 100 units/mL injection, 60 Units,  Subcutaneous, NIGHTLY  ipratropium (ATROVENT ) 0.02% nebulizer solution, 0.25 mg, Nebulization, Q6H  lactulose  (ENULOSE ) 10g per 15mL oral liquid, 30 mL, Oral, Daily  linaclotide  (LINZESS ) capsule, 290 mcg, Oral, QAM  metroNIDAZOLE  (FLAGYL ) tablet, 500 mg, Oral, 2x/day  nicotine  (NICODERM CQ ) transdermal patch (mg/24 hr), 14 mg, Transdermal, Q24H  nicotine  polacrilex (COMMIT) lozenge, 4 mg, Oral, Q1H PRN  NS 250 mL flush bag, , Intravenous, Q15 Min PRN  NS flush syringe, 3 mL, Intracatheter, Q8HRS  NS flush syringe, 3 mL, Intracatheter, Q1H PRN  ondansetron  (ZOFRAN ) 2 mg/mL injection, 4 mg, Intravenous, Q8H PRN  oxyCODONE  (ROXICODONE ) immediate release tablet, 5 mg, Oral, Q4H PRN  pantoprazole  (PROTONIX ) delayed release tablet, 40 mg, Oral, 2x/day  phenol (CHLORASEPTIC) 1.4% oromucosal spray, 1 Spray, Mouth/Throat, Q4H PRN  rosuvastatin  (CRESTOR ) tablet, 20 mg, Oral, QPM  sennosides-docusate sodium  (SENOKOT-S) 8.6-50mg  per tablet, 1 Tablet, Oral, HS PRN  SUMAtriptan  (IMITREX ) tablet, 50 mg, Oral, Once PRN         Physical Exam:  General: No acute distress   Cardiac:  Regular rate  Respiratory:  No increased work of breathing  Abdomen: Positive bowel sounds, soft, nontender  Extremities:  Right lower extremity wrapped in bandage    CBC (Last 24 Hours):    Recent Results last 24 hours  12/20/23  0613   WBC 9.3   HGB 14.7   HCT 44.5   MCV 94.5   PLTCNT 203         BMP (Last 24 Hours):    Recent Results last 24 hours     12/20/23  0613   SODIUM 136   POTASSIUM 4.1   CHLORIDE 102   CO2 23   BUN 9   CREATININE 0.62   CALCIUM 8.6         I have reviewed all labs.    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 with drainage of abscess and washout and open packing on 11/13   -the patient received Dalvance  yesterday on 11/25 no need to cover Gram-positive coverage at this time  -vascular consulted, status post OR; plans for wound VAC tomorrow  -Awaiting OR cultures  -infectious disease consulted  -continue oral antibiotics with ciprofloxacin  and Flagyl      History of cardiac diastolic dysfunction  Questionable CAD  -monitor IO as patient appears euvolemic  -continue aspirin   -continue Lasix      Uncontrolled type 2 diabetes mellitus complicated by peripheral neuropathy  -sugars on admission over 400  -glargine 60  -SSI aggressive  -adjust insulin  regimen as necessary   -continue gabapentin  b.i.d.     Chronic Obstructive Pulmonary Disease  -continue Pulmicort  and Brovana      Hypertension  -continue Lasix    -may benefit from Ace Arb given diabetic but blood pressure is within normal limits, may benefit from low-dose on discharge     Anxiety  -Klonopin       Tobacco use disorder   -nicotine  patch p.r.n.   -nicotine  lozenge     History of constipation   -continue Linzess , lactulose      gastroesophageal reflux disease   -Protonix             ___    DVT/PE Prophylaxis - Heparin   Consults - ID and vascular surgery  Diet - regular, low calorie count  Disposition Planning - Home discharge       Tita Cram, MD    This note may have been partially generated using MModal Fluency Direct system, and there may be some incorrect words, spellings, and punctuation that were not noted in checking the note before saving.

## 2023-12-20 NOTE — Consults (Signed)
 Yvette Schmidt  [id number]  12/20/2023    Attempted to see patient this morning however off the floor for surgery as planned by Dr. Adeniyi.  Patient well known to [facility] Infectious Disease service recently admitted secondary to ongoing right diabetic foot infection with abscess requiring debridement.  Pathology with residual osteomyelitis.  Patient discharged with weekly IV dalbavancin infusions last given on 12/19/2023, oral ciprofloxacin  and metronidazole .  Patient declined negative pressure wound VAC at last discharge.  She was seen at [facility] yesterday, admitted for further medical management.    Will follow any additional cultures and pathology obtained today from surgery.  No indication for additional MRSA and Gram-positive coverage since she did receive IV Dalvance  on 11/25.    Case reviewed at length with primary team.    Dorn Bars, DO  [facility] Infectious Diseases  12/20/2023 15:30

## 2023-12-20 NOTE — Nurses Notes (Signed)
 Pt is alert and orient X4 during shift. Pts IV was dry and intact. Pt complained of 9/10 pain, was given PRN tylenol  during shift. Pt was free from falls during shift. Pts vitals were stable during shift. Pt is currently resting in bed. I have reviewed the plan of care. Will continue to monitor.    BP 122/73   Pulse 94   Temp 36.8 C (98.2 F)   Resp 16   Ht 1.651 m (5' 5)   Wt (!) 138 kg (303 lb 2.1 oz)   SpO2 93%   BMI 50.44 kg/m     Lucious Howell, GN

## 2023-12-20 NOTE — Consults (Signed)
 CONCLUSION    S/p further repeat debridement of the right foot to healthy tissue margins.      Plan is for negative pressure wound therapy and antibiotics per ID.      Patient has history of extensive noncompliance.      Patient needs to be worked up for skilled nursing facility placement    Clinical correlation is suggested    Norleen DELENA Robe, MD  12/20/2023, 22:00

## 2023-12-20 NOTE — Care Management Notes (Signed)
 [facility]  Care Management Initial Evaluation    Patient Name: Yvette Schmidt  Date of Birth: 18-Jul-1975  Sex: female  Date/Time of Admission: 12/19/2023  4:25 PM  Room/Bed: 4108/A  Payor: HEALTH PLAN MEDICAID / Plan: HEALTH PLAN MEDICAID / Product Type: Medicaid MC /   Primary Care Providers:  Avelino Ripley Pop, MD, MD (General)    Pharmacy Info:   Preferred Pharmacy       [facility] - [place], [place] - [address]    [address] [address]    Phone: [phone] Fax: [phone]    Hours: Not open 24 hours    [facility]    [address] [place] [address]    Phone: [phone] Fax: [phone]    Hours: Monday-Friday 8AM-6PM, Saturday & "Sunday Closed    [facility]    [address] [place] [address]    Phone: [phone] Fax: [phone]    Hours: Monday-Friday 7AM-7PM, Saturday 10AM-3PM, Closed Sunday          Emergency Contact Info:   Extended Emergency Contact Information  Primary Emergency Contact: Gasparyan,JOSPEH  Address: [address]           [place], [place] [address] [place]  Home Phone: [phone]  Work Phone: [phone]  Mobile Phone: [phone]  Relation: Husband  Secondary Emergency Contact: POSTLETHWAIT,HELON  Address: [address]           [place], [place] [address] [place]  Home Phone: [phone]  Work Phone: [phone]  Mobile Phone: [phone]  Relation: Mother    History:   Yvette Schmidt is a 48 y.o., female, admitted for right foot infection.     Height/Weight: 165.1 cm (5' 5) / (!) 138 kg (303 lb 2.1 oz)     LOS: 1 day   Admitting Diagnosis: Right foot infection [L08.9]    Assessment:      11" /26/25 1214   Assessment Details   Assessment Type Admission   Date of Care Management Update 12/20/23   Insurance Information/Type   Insurance type Medicaid   Employment/Financial   Patient has Prescription Coverage?  Yes        Name of Insurance Coverage for  Medications Health Plan Medicaid   Financial/Environmental Concerns none   Living Environment   Select an age group to open lives with row.  Adult   Lives With spouse   Living Arrangements house   Able to Return to Prior Arrangements yes   Living Arrangement Comments Patient lives at home with her husband; home is 1 level with a ramp to enter.   Home Safety   Home Accessibility no concerns   Home Safety Comments Patient denies any concerns.   Care Management Plan   Discharge Planning Status initial meeting   Discharge plan discussed with: Patient   CM will evaluate for rehabilitation potential yes   Patient aware of possible cost for ambulance transport?  Yes   Discharge Needs Assessment   Equipment Currently Used at Home walker, front wheeled   Discharge Facility/Level of Care Needs Home with Home Health (code 6);Home vs Acute Rehab (not psych)   Transportation Available family or friend will provide;car   Referral Information   Admission Type inpatient         Discharge Plan:  Home vs acute rehab (not psych)  SW met with patient at bedside for initial assessment.  Patient lives at  home with her husband; home is 1 level with a ramp to enter.  Patient sister/family assist with transportation as needed.  Current mobility DME for home use; FWW.  No home O2.  No home health.  PCP is Dr. Ripley Puller, patient reports that she typically sees his NP.  Preferred pharmacy is [facility].  Patient is able to afford her current medications.  Primary insurance is Health Plan Medicaid.  Patient address and phone number confirmed.  Patient reports that home has all basic necessities and that she feels safe at home.  No MPOA on file; information offered and declined.      Patient scheduled for I&D today.  PT to eval.     SW offered patient time to voice any needs or questions; none noted at this time.     The patient will continue to be evaluated for developing discharge needs.     Case Manager: Jonna Hoyle,  MSW,LGSW  Phone: [phone]

## 2023-12-20 NOTE — Anesthesia Postprocedure Evaluation (Signed)
 Anesthesia Post Op Evaluation    Patient: Yvette Schmidt  Procedure(s):  DEBRIDEMENT FOOT RIGHT    Last Vitals:Temperature: 36.2 C (97.2 F) (12/20/23 1047)  Heart Rate: 91 (12/20/23 1100)  BP (Non-Invasive): 117/74 (12/20/23 1100)  Respiratory Rate: 16 (12/20/23 1100)  SpO2: 95 % (12/20/23 1100)    No notable events documented.    Patient is sufficiently recovered from the effects of anesthesia to participate in the evaluation and has returned to their pre-procedure level.  Patient location during evaluation: PACU       Patient participation: complete - patient participated  Level of consciousness: awake and alert and responsive to verbal stimuli    Pain management: adequate  Airway patency: patent    Anesthetic complications: no  Cardiovascular status: acceptable  Respiratory status: acceptable  Hydration status: acceptable  Patient post-procedure temperature: Pt Normothermic   PONV Status: Absent

## 2023-12-20 NOTE — H&P (Signed)
 [facility]  H&P Update Form    Elira, Colasanti, 48 y.o. female  Date of Admission:  12/19/2023  Date of Birth:  [birth year]    12/20/2023    STOP: IF H&P IS GREATER THAN 30 DAYS FROM SURGICAL DAY COMPLETE NEW H&P IS REQUIRED.     H & P updated the day of the procedure.  1.  H&P completed within 30 days of surgical procedure and has been reviewed within 24 hours of the surgery, the patient has been examined, and no change has occured in the patients condition since the H&P was completed.       Change in medications: No      Comments:     2.  Patient continues to be appropiate candidate for planned surgical procedure. YES    3.  In addition to the risks associated with this particular procedure that have been explained to the patient as well as the benefit to the procedure, the patient also understands that at this time there is additional environmental risks associated with the presence of COVID -19 in our hospital. Patient consents to the procedure accepting this additional risk.    Cayleen Benjamin A Shakeira Rhee, MD

## 2023-12-20 NOTE — OR Surgeon (Signed)
 OPERATIVE NOTE    Patient Name: Yvette Schmidt  Age:  48 y.o.  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    Further debridement excisional with removal of nonviable skin subcutaneous tissue fat fascia muscle tendon.    Washout and open packing of wounds    Indication:  As noted above.  Poor compliance at home.  Refused skilled nursing facility care.  Refused negative pressure wound therapy.  Also refused home health home visits wound deteriorated and was admitted to the hospital for further debridement and antibiotics            Narrative of Procedure:  Indications contraindications risks benefits and alternative therapies to the procedure were discussed with the patient including the option for a 2nd opinion if deemed appropriate.  Appropriate consents were obtained.  Patient was brought to the operating room. Patient was positioned supine on the operating table Appropriate lines and tubes were placed.  Mac anesthesia was initiated by the anesthesiologist.  Right foot was prepped and draped in sterile fashion standard for the procedure.      Using a scalpel I sharply debrided the lateral right foot wound removing nonviable skin edges subcutaneous tissue and fascia.  Some of the intrinsic muscles of the 4th ray were sharply resected.  Long flexor and extensor tendons of the 4th ray were debrided to healthy margins.      The wound was now irrigated.  Hemostasis was secured.  The wound was packed open.  Patient tolerated the procedure well.  There were no complications.  Blood loss was minimal    Findings:  Wound measured 14 by 8 cm maximum dimensions post debridement       POST DEBRIDEMENT    Attending Surgeon: Norleen DELENA Robe, MD    Assistant(s):  None    Anesthesia Type: General     Estimated Blood Loss:  Minimal    Blood Given: None    Fluids Given: Per Anesthesia Records     Complications (not routinely expected or not inherent to difficulty/nature of procedure): None    Characteristic Event (routinely expected or inherent to the difficulty/nature of the procedure): None    Did the use of current and/or prior Anticoagulants impact the outcome of the case?no    Wound Class: Dirty or Infected Wounds - Include old traumatic wounds    Tubes: None    Drains: None    Specimens/ Cultures:  All debrided tissue    Implants: None           Disposition: PACU - hemodynamically stable.    Condition: stable    Kahil Agner A Elaysha Bevard, MD11/26/202521:53

## 2023-12-21 LAB — BASIC METABOLIC PANEL
ANION GAP: 10 mmol/L (ref 4–13)
BUN/CREA RATIO: 17 (ref 6–22)
BUN: 11 mg/dL (ref 8–25)
CALCIUM: 8.6 mg/dL (ref 8.6–10.2)
CHLORIDE: 99 mmol/L (ref 96–111)
CO2 TOTAL: 24 mmol/L (ref 22–30)
CREATININE: 0.65 mg/dL (ref 0.60–1.05)
GLUCOSE: 404 mg/dL (ref 65–125)
POTASSIUM: 4.1 mmol/L (ref 3.5–5.1)
SODIUM: 133 mmol/L — ABNORMAL LOW (ref 136–145)
eGFRcr - FEMALE: >90 mL/min/1.73mˆ2 (ref >=60)

## 2023-12-21 LAB — CBC WITH DIFF
BASOPHIL #: <0.1 x10ˆ3/uL (ref <=0.20)
BASOPHIL %: 0.3 %
EOSINOPHIL #: 0.13 x10ˆ3/uL (ref <=0.50)
EOSINOPHIL %: 1.3 %
HCT: 44.3 % (ref 34.8–46.0)
HGB: 14.7 g/dL (ref 11.5–16.0)
IMMATURE GRANULOCYTE #: <0.1 x10ˆ3/uL (ref <0.10)
IMMATURE GRANULOCYTE %: 0.3 % (ref 0.0–1.0)
LYMPHOCYTE #: 2.94 x10ˆ3/uL (ref 1.00–4.80)
LYMPHOCYTE %: 28.6 %
MCH: 31 pg (ref 26.0–32.0)
MCHC: 33.2 g/dL (ref 31.0–35.5)
MCV: 93.5 fL (ref 78.0–100.0)
MONOCYTE #: 0.56 x10ˆ3/uL (ref 0.20–1.10)
MONOCYTE %: 5.4 %
MPV: 10.7 fL (ref 8.7–12.5)
NEUTROPHIL #: 6.59 x10ˆ3/uL (ref 1.50–7.70)
NEUTROPHIL %: 64.1 %
PLATELETS: 216 x10ˆ3/uL (ref 150–400)
RBC: 4.74 x10ˆ6/uL (ref 3.85–5.22)
RDW-CV: 14.6 % (ref 11.5–15.5)
WBC: 10.3 x10ˆ3/uL (ref 3.7–11.0)

## 2023-12-21 LAB — POC BLOOD GLUCOSE (RESULTS)
GLUCOSE, POC: 399 mg/dL — ABNORMAL HIGH (ref 70–110)
GLUCOSE, POC: 360 mg/dL — ABNORMAL HIGH (ref 70–110)
GLUCOSE, POC: 501 mg/dL — ABNORMAL HIGH (ref 70–110)
GLUCOSE, POC: 479 mg/dL — ABNORMAL HIGH (ref 70–110)

## 2023-12-21 LAB — PHOSPHORUS: PHOSPHORUS: 3.1 mg/dL (ref 2.4–4.7)

## 2023-12-21 LAB — MAGNESIUM: MAGNESIUM: 1.9 mg/dL (ref 1.8–2.6)

## 2023-12-21 MED ORDER — SUMATRIPTAN 25 MG TABLET
25.0000 mg | ORAL_TABLET | ORAL | Status: AC | PRN
Start: 2023-12-21 — End: 2023-12-24
  Administered 2023-12-21 – 2023-12-22 (×2): 25 mg via ORAL
  Filled 2023-12-21 (×2): qty 1

## 2023-12-21 MED ORDER — FLUTICASONE PROPIONATE 50 MCG/ACTUATION NASAL SPRAY,SUSPENSION
1.0000 | Freq: Two times a day (BID) | NASAL | Status: DC | PRN
  Administered 2023-12-21 – 2023-12-26 (×5): 1 via NASAL
  Filled 2023-12-21: qty 16

## 2023-12-21 MED ORDER — INSULIN LISPRO 100 UNIT/ML SUB-Q - CHARGE BY DOSE
10.0000 [IU] | Freq: Three times a day (TID) | SUBCUTANEOUS | Status: DC
Start: 2023-12-21 — End: 2023-12-23
  Administered 2023-12-21 – 2023-12-23 (×6): 10 [IU] via SUBCUTANEOUS
  Filled 2023-12-21 (×5): qty 10

## 2023-12-21 MED ORDER — HYDROMORPHONE (PF) 0.5 MG/0.5 ML INJECTION SYRINGE
0.3000 mg | INJECTION | INTRAMUSCULAR | Status: AC | PRN
Start: 2023-12-21 — End: 2023-12-22
  Administered 2023-12-21: 0.3 mg via INTRAVENOUS
  Filled 2023-12-21: qty 0.5

## 2023-12-21 MED ORDER — LORAZEPAM 1 MG TABLET
1.0000 mg | ORAL_TABLET | ORAL | Status: AC
Start: 2023-12-21 — End: 2023-12-21
  Administered 2023-12-21: 1 mg via ORAL
  Filled 2023-12-21: qty 1

## 2023-12-21 MED ORDER — INSULIN GLARGINE 100 UNITS/ML SUBQ - CHARGE BY DOSE
80.0000 [IU] | Freq: Every evening | SUBCUTANEOUS | Status: DC
Start: 2023-12-21 — End: 2023-12-22
  Administered 2023-12-21: 80 [IU] via SUBCUTANEOUS
  Filled 2023-12-21: qty 80

## 2023-12-21 NOTE — Care Plan (Signed)
 Problem: Wound  Goal: Optimal Coping  Outcome: Ongoing (see interventions/notes)  Goal: Optimal Functional Ability  Outcome: Ongoing (see interventions/notes)  Goal: Absence of Infection Signs and Symptoms  Outcome: Ongoing (see interventions/notes)  Intervention: Prevent or Manage Infection  Recent Flowsheet Documentation  Taken 12/21/2023 2000 by Lucious DEL, GN  Fever Reduction/Comfort Measures:   lightweight bedding   lightweight clothing  Goal: Improved Oral Intake  Outcome: Ongoing (see interventions/notes)  Goal: Optimal Pain Control and Function  Outcome: Ongoing (see interventions/notes)  Intervention: Prevent or Manage Pain  Recent Flowsheet Documentation  Taken 12/21/2023 2000 by Lucious DEL, GN  Sleep/Rest Enhancement: awakenings minimized  Goal: Skin Health and Integrity  Outcome: Ongoing (see interventions/notes)  Goal: Optimal Wound Healing  Outcome: Ongoing (see interventions/notes)  Intervention: Promote Wound Healing  Recent Flowsheet Documentation  Taken 12/21/2023 2000 by Lucious DEL, GN  Sleep/Rest Enhancement: awakenings minimized     Problem: Adult Inpatient Plan of Care  Goal: Absence of Hospital-Acquired Illness or Injury  Outcome: Ongoing (see interventions/notes)  Intervention: Identify and Manage Fall Risk  Recent Flowsheet Documentation  Taken 12/21/2023 2000 by Lucious DEL, GN  Safety Promotion/Fall Prevention:   activity supervised   fall prevention program maintained   safety round/check completed   nonskid shoes/slippers when out of bed  Intervention: Prevent and Manage VTE (Venous Thromboembolism) Risk  Recent Flowsheet Documentation  Taken 12/21/2023 2000 by Lucious DEL, GN  VTE Prevention/Management: ambulation promoted  Goal: Optimal Comfort and Wellbeing  Outcome: Ongoing (see interventions/notes)  Goal: Rounds/Family Conference  Outcome: Ongoing (see interventions/notes)     Problem: Health Knowledge, Opportunity to Enhance (Adult,Obstetrics,Pediatric)  Goal: Knowledgeable about Health  Subject/Topic  Description: Patient will demonstrate the desired outcomes by discharge/transition of care.  Outcome: Ongoing (see interventions/notes)     Problem: Surgery Nonspecified  Goal: Absence of Bleeding  Outcome: Ongoing (see interventions/notes)  Goal: Effective Bowel Elimination  Outcome: Ongoing (see interventions/notes)  Goal: Fluid and Electrolyte Balance  Outcome: Ongoing (see interventions/notes)  Goal: Blood Glucose Level Within Target Range  Outcome: Ongoing (see interventions/notes)  Goal: Absence of Infection Signs and Symptoms  Outcome: Ongoing (see interventions/notes)  Intervention: Prevent or Manage Infection  Recent Flowsheet Documentation  Taken 12/21/2023 2000 by Lucious DEL, GN  Fever Reduction/Comfort Measures:   lightweight bedding   lightweight clothing  Goal: Anesthesia/Sedation Recovery  Outcome: Ongoing (see interventions/notes)  Intervention: Optimize Anesthesia Recovery  Recent Flowsheet Documentation  Taken 12/21/2023 2000 by Lucious DEL, GN  Safety Promotion/Fall Prevention:   activity supervised   fall prevention program maintained   safety round/check completed   nonskid shoes/slippers when out of bed  Goal: Optimal Pain Control and Function  Outcome: Ongoing (see interventions/notes)  Goal: Nausea and Vomiting Relief  Outcome: Ongoing (see interventions/notes)  Goal: Effective Urinary Elimination  Outcome: Ongoing (see interventions/notes)  Goal: Effective Oxygenation and Ventilation  Outcome: Ongoing (see interventions/notes)     Problem: Comorbidity Management  Goal: Maintenance of Asthma Control  Outcome: Ongoing (see interventions/notes)  Goal: Maintenance of COPD Symptom Control  Outcome: Ongoing (see interventions/notes)  Goal: Blood Glucose Level Within Target Range  Outcome: Ongoing (see interventions/notes)  Goal: Blood Pressure in Desired Range  Outcome: Ongoing (see interventions/notes)

## 2023-12-21 NOTE — Nurses Notes (Signed)
 Pt is alert and orient X4 during shift. Pts IV was dry and intact. Pt complained of 10/10 foot pain, was given PRN oxycodone  & 1x dose of 0.3mg  of dialudid during shift. Pt was free from falls during shift. Pts vitals were stable during shift. Pt is currently resting in bed. I have reviewed the plan of care. Will continue to monitor.    BP (!) 140/92   Pulse 89   Temp 36.6 C (97.9 F)   Resp 16   Ht 1.651 m (5' 5)   Wt (!) 138 kg (303 lb 2.1 oz)   SpO2 90%   BMI 50.44 kg/m     Lucious Howell, GN

## 2023-12-21 NOTE — Progress Notes (Signed)
 [facility]  Medicine Progress Note  FULL CODE: ATTEMPT RESUSCITATION/CPR    Yvette Schmidt  Date of service: 12/21/2023    LOS: 2   Subjective:  Patient is sleeping, comfortable, pain well controlled.  Wound VAC now in place.  Doing well overall, likely discharge tomorrow with appropriate antibiotic regimen.    Filed Vitals:    12/20/23 2349 12/21/23 0133 12/21/23 0344 12/21/23 0754   BP: (!) 140/92   136/81   Pulse: 89   82   Resp: 18  16 18    Temp: 36.8 C (98.2 F) 36.6 C (97.9 F)  36.4 C (97.5 F)   SpO2: 93%  90% 90%     Oxygen Device  SpO2: 90 %  Flow (L/min) (Oxygen Therapy): 6    I have reviewed the vitals.    Input/Output    Intake/Output Summary (Last 24 hours) at 12/21/2023 1121  Last data filed at 12/21/2023 1100  Gross per 24 hour   Intake 1842 ml   Output 1400 ml   Net 442 ml       acetaminophen  (TYLENOL ) tablet, 650 mg, Oral, Q6H PRN  albuterol  (PROVENTIL ) 2.5 mg / 3 mL (0.083%) neb solution, 2.5 mg, Nebulization, Q6H PRN  budesonide  (PULMICORT  RESPULES) 0.5 mg/2 mL nebulizer suspension, 0.5 mg, Nebulization, 2x/day   And  arformoterol  (BROVANA ) 15 mcg/2 mL nebulizer solution, 15 mcg, Nebulization, 2x/day  aspirin  chewable tablet 81 mg, 81 mg, Oral, Daily  ciprofloxacin  (CIPRO ) tablet, 500 mg, Oral, 2x/day  clonazePAM  (klonoPIN ) tablet, 0.5 mg, Oral, 2x/day  Correction/SSIP insulin  lispro 100 units/mL injection, 4-18 Units, Subcutaneous, 4x/day AC  D5W 250 mL flush bag, , Intravenous, Q15 Min PRN  dextrose  (GLUTOSE) 40% oral gel, 15 g, Oral, Q15 Min PRN  dextrose  50% (0.5 g/mL) injection - syringe, 12.5 g, Intravenous, Q15 Min PRN  furosemide  (LASIX ) tablet, 40 mg, Oral, Daily  gabapentin  (NEURONTIN ) capsule, 400 mg, Oral, 2x/day  glucagon  (GLUCAGEN) injection 1 mg, 1 mg, IntraMUSCULAR, Once PRN  heparin  10,000 units/mL injection, 5,000 Units, Subcutaneous, Q8HRS  insulin  glargine 100 units/mL injection, 80 Units, Subcutaneous, NIGHTLY  insulin  lispro 100 units/mL injection, 10 Units,  Subcutaneous, 3x/day AC  ipratropium (ATROVENT ) 0.02% nebulizer solution, 0.25 mg, Nebulization, Q6H  lactulose  (ENULOSE ) 10g per 15mL oral liquid, 30 mL, Oral, Daily  linaclotide  (LINZESS ) capsule, 290 mcg, Oral, QAM  metroNIDAZOLE  (FLAGYL ) tablet, 500 mg, Oral, 2x/day  nicotine  (NICODERM CQ ) transdermal patch (mg/24 hr), 14 mg, Transdermal, Q24H  nicotine  polacrilex (COMMIT) lozenge, 4 mg, Oral, Q1H PRN  NS 250 mL flush bag, , Intravenous, Q15 Min PRN  NS flush syringe, 3 mL, Intracatheter, Q8HRS  NS flush syringe, 3 mL, Intracatheter, Q1H PRN  ondansetron  (ZOFRAN ) 2 mg/mL injection, 4 mg, Intravenous, Q8H PRN  oxyCODONE  (ROXICODONE ) immediate release tablet, 5 mg, Oral, Q4H PRN  pantoprazole  (PROTONIX ) delayed release tablet, 40 mg, Oral, 2x/day  phenol (CHLORASEPTIC) 1.4% oromucosal spray, 1 Spray, Mouth/Throat, Q4H PRN  rosuvastatin  (CRESTOR ) tablet, 20 mg, Oral, QPM  sennosides-docusate sodium  (SENOKOT-S) 8.6-50mg  per tablet, 1 Tablet, Oral, HS PRN         Physical Exam:  General: No acute distress   Cardiac:  Regular rate  Respiratory:  No increased work of breathing  Abdomen: Positive bowel sounds, soft, nontender  Extremities:  Right lower extremity wound VAC in place.    CBC (Last 24 Hours):    Recent Results last 24 hours     12/21/23  0713   WBC 10.3   HGB  14.7   HCT 44.3   MCV 93.5   PLTCNT 216         BMP (Last 24 Hours):    Recent Results last 24 hours     12/21/23  0713   SODIUM 133*   POTASSIUM 4.1   CHLORIDE 99   CO2 24   BUN 11   CREATININE 0.65   CALCIUM 8.6         I have reviewed all labs.    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 with drainage of abscess and washout and open packing on 11/13   -the patient received Dalvance  on 11/25 no need to cover Gram-positive coverage at this time  -vascular consulted, status post OR, wound VAC now in place  -or cultures growing rare yeast, will await 48 hours to ensure no other bacteria is growing  -infectious disease consulted, following  -continue oral antibiotics with ciprofloxacin  and Flagyl , Dalvance  outpatient     History of cardiac diastolic dysfunction  Questionable CAD  -monitor IO as patient appears euvolemic  -continue aspirin   -continue Lasix      Uncontrolled type 2 diabetes mellitus complicated by peripheral neuropathy  -sugars on admission over 400  -glargine increased to 80  -added lispro t.i.d. 10 units  -SSI aggressive  -adjust insulin  regimen as necessary   -continue gabapentin  b.i.d.     Chronic Obstructive Pulmonary Disease  -continue Pulmicort  and Brovana      Hypertension  -continue Lasix    -may benefit from Ace Arb given diabetic but blood pressure is within normal limits, may benefit from low-dose on discharge     Anxiety  -Klonopin       Tobacco use disorder   -nicotine  patch p.r.n.   -nicotine  lozenge     History of constipation   -continue Linzess , lactulose      gastroesophageal reflux disease   -Protonix             ___    DVT/PE Prophylaxis - Heparin   Consults - ID and vascular surgery  Diet - regular, low calorie count  Disposition Planning - Home discharge       Tita Cram, MD    This note may have been partially generated using MModal Fluency Direct system, and there may be some incorrect words, spellings, and punctuation that were not noted in checking the note before saving.

## 2023-12-21 NOTE — Care Plan (Signed)
 Problem: Wound  Goal: Optimal Coping  12/21/2023 1004 by Dorene MATSU, RN  Outcome: Ongoing (see interventions/notes)  12/21/2023 1004 by Dorene MATSU, RN  Outcome: Ongoing (see interventions/notes)  Goal: Optimal Functional Ability  12/21/2023 1004 by Dorene MATSU, RN  Outcome: Ongoing (see interventions/notes)  12/21/2023 1004 by Dorene MATSU, RN  Outcome: Ongoing (see interventions/notes)  Goal: Absence of Infection Signs and Symptoms  12/21/2023 1004 by Dorene MATSU, RN  Outcome: Ongoing (see interventions/notes)  12/21/2023 1004 by Dorene MATSU, RN  Outcome: Ongoing (see interventions/notes)  Goal: Improved Oral Intake  12/21/2023 1004 by Dorene MATSU, RN  Outcome: Ongoing (see interventions/notes)  12/21/2023 1004 by Dorene MATSU, RN  Outcome: Ongoing (see interventions/notes)  Goal: Optimal Pain Control and Function  12/21/2023 1004 by Dorene MATSU, RN  Outcome: Ongoing (see interventions/notes)  12/21/2023 1004 by Dorene MATSU, RN  Outcome: Ongoing (see interventions/notes)  Goal: Skin Health and Integrity  12/21/2023 1004 by Dorene MATSU, RN  Outcome: Ongoing (see interventions/notes)  12/21/2023 1004 by Dorene MATSU, RN  Outcome: Ongoing (see interventions/notes)  Goal: Optimal Wound Healing  12/21/2023 1004 by Dorene MATSU, RN  Outcome: Ongoing (see interventions/notes)  12/21/2023 1004 by Dorene MATSU, RN  Outcome: Ongoing (see interventions/notes)     Problem: Adult Inpatient Plan of Care  Goal: Absence of Hospital-Acquired Illness or Injury  12/21/2023 1004 by Dorene MATSU, RN  Outcome: Ongoing (see interventions/notes)  12/21/2023 1004 by Dorene MATSU, RN  Outcome: Ongoing (see interventions/notes)  Goal: Optimal Comfort and Wellbeing  12/21/2023 1004 by Dorene MATSU, RN  Outcome: Ongoing (see interventions/notes)  12/21/2023 1004 by Dorene MATSU, RN  Outcome: Ongoing (see interventions/notes)  Goal: Rounds/Family Conference  12/21/2023 1004 by Dorene MATSU, RN  Outcome: Ongoing (see interventions/notes)  12/21/2023  1004 by Dorene MATSU, RN  Outcome: Ongoing (see interventions/notes)     Problem: Health Knowledge, Opportunity to Enhance (Adult,Obstetrics,Pediatric)  Goal: Knowledgeable about Health Subject/Topic  Description: Patient will demonstrate the desired outcomes by discharge/transition of care.  12/21/2023 1004 by Dorene MATSU, RN  Outcome: Ongoing (see interventions/notes)  12/21/2023 1004 by Dorene MATSU, RN  Outcome: Ongoing (see interventions/notes)     Problem: Surgery Nonspecified  Goal: Absence of Bleeding  12/21/2023 1004 by Dorene MATSU, RN  Outcome: Ongoing (see interventions/notes)  12/21/2023 1004 by Dorene MATSU, RN  Outcome: Ongoing (see interventions/notes)     Problem: Comorbidity Management  Goal: Maintenance of Asthma Control  12/21/2023 1004 by Dorene MATSU, RN  Outcome: Ongoing (see interventions/notes)  12/21/2023 1004 by Dorene MATSU, RN  Outcome: Ongoing (see interventions/notes)  Goal: Maintenance of COPD Symptom Control  12/21/2023 1004 by Dorene MATSU, RN  Outcome: Ongoing (see interventions/notes)  12/21/2023 1004 by Dorene MATSU, RN  Outcome: Ongoing (see interventions/notes)  Goal: Blood Glucose Level Within Target Range  12/21/2023 1004 by Dorene MATSU, RN  Outcome: Ongoing (see interventions/notes)  12/21/2023 1004 by Dorene MATSU, RN  Outcome: Ongoing (see interventions/notes)  Goal: Blood Pressure in Desired Range  12/21/2023 1004 by Dorene MATSU, RN  Outcome: Ongoing (see interventions/notes)  12/21/2023 1004 by Dorene MATSU, RN  Outcome: Ongoing (see interventions/notes)

## 2023-12-22 ENCOUNTER — Inpatient Hospital Stay (HOSPITAL_COMMUNITY): Payer: MEDICAID

## 2023-12-22 DIAGNOSIS — R112 Nausea with vomiting, unspecified: Secondary | ICD-10-CM

## 2023-12-22 DIAGNOSIS — K5909 Other constipation: Secondary | ICD-10-CM

## 2023-12-22 DIAGNOSIS — L02611 Cutaneous abscess of right foot: Secondary | ICD-10-CM

## 2023-12-22 DIAGNOSIS — Z794 Long term (current) use of insulin: Secondary | ICD-10-CM

## 2023-12-22 DIAGNOSIS — E1165 Type 2 diabetes mellitus with hyperglycemia: Secondary | ICD-10-CM

## 2023-12-22 DIAGNOSIS — K760 Fatty (change of) liver, not elsewhere classified: Secondary | ICD-10-CM

## 2023-12-22 DIAGNOSIS — Z6841 Body Mass Index (BMI) 40.0 and over, adult: Secondary | ICD-10-CM

## 2023-12-22 LAB — CBC WITH DIFF
BASOPHIL #: <0.1 x10ˆ3/uL (ref <=0.20)
BASOPHIL %: 0.6 %
EOSINOPHIL #: 0.31 x10ˆ3/uL (ref <=0.50)
EOSINOPHIL %: 3.1 %
HCT: 45.4 % (ref 34.8–46.0)
HGB: 14.8 g/dL (ref 11.5–16.0)
IMMATURE GRANULOCYTE #: <0.1 x10ˆ3/uL (ref <0.10)
IMMATURE GRANULOCYTE %: 0.3 % (ref 0.0–1.0)
LYMPHOCYTE #: 3.14 x10ˆ3/uL (ref 1.00–4.80)
LYMPHOCYTE %: 31 %
MCH: 30.6 pg (ref 26.0–32.0)
MCHC: 32.6 g/dL (ref 31.0–35.5)
MCV: 94 fL (ref 78.0–100.0)
MONOCYTE #: 0.65 x10ˆ3/uL (ref 0.20–1.10)
MONOCYTE %: 6.4 %
MPV: 10.8 fL (ref 8.7–12.5)
NEUTROPHIL #: 5.93 x10ˆ3/uL (ref 1.50–7.70)
NEUTROPHIL %: 58.6 %
PLATELETS: 208 x10ˆ3/uL (ref 150–400)
RBC: 4.83 x10ˆ6/uL (ref 3.85–5.22)
RDW-CV: 14.9 % (ref 11.5–15.5)
WBC: 10.1 x10ˆ3/uL (ref 3.7–11.0)

## 2023-12-22 LAB — BLOOD GAS
%FIO2 (VENOUS): 21 %
BASE EXCESS: 4.5 mmol/L — ABNORMAL HIGH (ref 0.0–3.0)
BICARBONATE (VENOUS): 28.3 mmol/L (ref 22.0–29.0)
O2 SATURATION (VENOUS): 92.2 % (ref 40.0–85.0)
PCO2 (VENOUS): 46 mmHg (ref 41–51)
PH (VENOUS): 7.42 (ref 7.32–7.43)
PO2 (VENOUS): 63 mmHg (ref 35–50)

## 2023-12-22 LAB — BASIC METABOLIC PANEL
ANION GAP: 10 mmol/L (ref 4–13)
BUN/CREA RATIO: 22 (ref 6–22)
BUN: 15 mg/dL (ref 8–25)
CALCIUM: 8.7 mg/dL (ref 8.6–10.2)
CHLORIDE: 98 mmol/L (ref 96–111)
CO2 TOTAL: 27 mmol/L (ref 22–30)
CREATININE: 0.67 mg/dL (ref 0.60–1.05)
GLUCOSE: 304 mg/dL — ABNORMAL HIGH (ref 65–125)
POTASSIUM: 3.8 mmol/L (ref 3.5–5.1)
SODIUM: 135 mmol/L — ABNORMAL LOW (ref 136–145)
eGFRcr - FEMALE: >90 mL/min/1.73mˆ2 (ref >=60)

## 2023-12-22 LAB — POC BLOOD GLUCOSE (RESULTS)
GLUCOSE, POC: 346 mg/dL — ABNORMAL HIGH (ref 70–110)
GLUCOSE, POC: 296 mg/dL — ABNORMAL HIGH (ref 70–110)
GLUCOSE, POC: 475 mg/dL — ABNORMAL HIGH (ref 70–110)
GLUCOSE, POC: 417 mg/dL — ABNORMAL HIGH (ref 70–110)

## 2023-12-22 LAB — PHOSPHORUS: PHOSPHORUS: 3.6 mg/dL (ref 2.4–4.7)

## 2023-12-22 LAB — MAGNESIUM: MAGNESIUM: 1.8 mg/dL (ref 1.8–2.6)

## 2023-12-22 MED ORDER — PROCHLORPERAZINE EDISYLATE 10 MG/2 ML (5 MG/ML) INJECTION SOLUTION
10.0000 mg | Freq: Once | INTRAMUSCULAR | Status: AC
Start: 2023-12-22 — End: 2023-12-22
  Administered 2023-12-22: 10 mg via INTRAVENOUS
  Filled 2023-12-22: qty 2

## 2023-12-22 MED ORDER — SUCRALFATE 1 GRAM TABLET
1.0000 g | ORAL_TABLET | Freq: Four times a day (QID) | ORAL | Status: DC
  Administered 2023-12-22 – 2023-12-25 (×12): 1 g via ORAL
  Administered 2023-12-25 – 2023-12-27 (×9): 0 g via ORAL
  Filled 2023-12-22 (×16): qty 1

## 2023-12-22 MED ORDER — PANTOPRAZOLE 40 MG INTRAVENOUS SOLUTION
40.0000 mg | Freq: Two times a day (BID) | INTRAVENOUS | Status: DC
  Administered 2023-12-22 – 2023-12-24 (×7): 40 mg via INTRAVENOUS
  Filled 2023-12-22 (×8): qty 10

## 2023-12-22 MED ORDER — INSULIN GLARGINE 100 UNITS/ML SUBQ - CHARGE BY DOSE
50.0000 [IU] | Freq: Two times a day (BID) | SUBCUTANEOUS | Status: DC
  Administered 2023-12-22 – 2023-12-23 (×3): 50 [IU] via SUBCUTANEOUS
  Filled 2023-12-22 (×2): qty 1
  Filled 2023-12-22: qty 50
  Filled 2023-12-22: qty 1

## 2023-12-22 MED ORDER — INSULIN GLARGINE 100 UNITS/ML SUBQ - CHARGE BY DOSE
20.0000 [IU] | Freq: Once | SUBCUTANEOUS | Status: AC
Start: 2023-12-22 — End: 2023-12-22
  Administered 2023-12-22: 20 [IU] via SUBCUTANEOUS
  Filled 2023-12-22: qty 20

## 2023-12-22 MED ORDER — CLONAZEPAM 0.5 MG TABLET
0.5000 mg | ORAL_TABLET | Freq: Three times a day (TID) | ORAL | Status: DC
  Administered 2023-12-22 – 2023-12-27 (×16): 0.5 mg via ORAL
  Filled 2023-12-22 (×16): qty 1

## 2023-12-22 MED ORDER — SODIUM CHLORIDE 0.9 % INTRAVENOUS SOLUTION
150.0000 mg | INTRAVENOUS | Status: DC
  Administered 2023-12-22: 0 mg via INTRAVENOUS
  Administered 2023-12-22: 150 mg via INTRAVENOUS
  Administered 2023-12-23: 0 mg via INTRAVENOUS
  Administered 2023-12-23: 150 mg via INTRAVENOUS
  Administered 2023-12-24: 0 mg via INTRAVENOUS
  Administered 2023-12-24: 150 mg via INTRAVENOUS
  Administered 2023-12-25 (×2): 0 mg via INTRAVENOUS
  Administered 2023-12-25: 150 mg via INTRAVENOUS
  Filled 2023-12-22: qty 5
  Filled 2023-12-22: qty 7.5
  Filled 2023-12-22: qty 5
  Filled 2023-12-22: qty 7.5
  Filled 2023-12-22: qty 5
  Filled 2023-12-22: qty 7.5

## 2023-12-22 NOTE — Progress Notes (Signed)
 [facility]  Medicine Interval/Cross Coverage Note  12/22/2023    The patient had persistent nausea and vomiting, treated effectively with compazine . Occult blood testing has been performed and was reported positive. Hemodynamics and hemoglobin have been stable. Continue on Protonix . If there is concern for GI bleeding, she may require endoscopy.     Yvette Mahany, MD

## 2023-12-22 NOTE — Care Plan (Signed)
 Problem: Wound  Goal: Optimal Coping  Outcome: Ongoing (see interventions/notes)  Goal: Optimal Functional Ability  Outcome: Ongoing (see interventions/notes)  Goal: Absence of Infection Signs and Symptoms  Outcome: Ongoing (see interventions/notes)  Goal: Improved Oral Intake  Outcome: Ongoing (see interventions/notes)  Goal: Optimal Pain Control and Function  Outcome: Ongoing (see interventions/notes)  Goal: Skin Health and Integrity  Outcome: Ongoing (see interventions/notes)  Goal: Optimal Wound Healing  Outcome: Ongoing (see interventions/notes)     Problem: Adult Inpatient Plan of Care  Goal: Absence of Hospital-Acquired Illness or Injury  Outcome: Ongoing (see interventions/notes)  Goal: Optimal Comfort and Wellbeing  Outcome: Ongoing (see interventions/notes)  Goal: Rounds/Family Conference  Outcome: Ongoing (see interventions/notes)     Problem: Health Knowledge, Opportunity to Enhance (Adult,Obstetrics,Pediatric)  Goal: Knowledgeable about Health Subject/Topic  Description: Patient will demonstrate the desired outcomes by discharge/transition of care.  Outcome: Ongoing (see interventions/notes)     Problem: Surgery Nonspecified  Goal: Absence of Bleeding  Outcome: Ongoing (see interventions/notes)  Goal: Effective Bowel Elimination  Outcome: Ongoing (see interventions/notes)  Goal: Fluid and Electrolyte Balance  Outcome: Ongoing (see interventions/notes)  Goal: Blood Glucose Level Within Target Range  Outcome: Ongoing (see interventions/notes)  Goal: Absence of Infection Signs and Symptoms  Outcome: Ongoing (see interventions/notes)  Goal: Anesthesia/Sedation Recovery  Outcome: Ongoing (see interventions/notes)  Goal: Optimal Pain Control and Function  Outcome: Ongoing (see interventions/notes)  Goal: Nausea and Vomiting Relief  Outcome: Ongoing (see interventions/notes)  Goal: Effective Urinary Elimination  Outcome: Ongoing (see interventions/notes)  Goal: Effective Oxygenation and Ventilation  Outcome:  Ongoing (see interventions/notes)

## 2023-12-22 NOTE — Progress Notes (Signed)
 [facility]  Medicine Progress Note  FULL CODE: ATTEMPT RESUSCITATION/CPR    Yvette Schmidt  Date of service: 12/22/2023    LOS: 3   Subjective:  Patient expressed frustration of still being admitted, she reports ongoing anxiety - home clonazepam  increased to TID. 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     Filed Vitals:    12/21/23 2307 12/21/23 2348 12/21/23 2355 12/22/23 0831   BP:   (!) 166/124 (!) 127/94   Pulse:   97 91   Resp: 18 16 18 17    Temp:   36.8 C (98.2 F) 36.8 C (98.2 F)   SpO2:   92% (!) 89%     Oxygen Device  SpO2: (!) 89 %  Flow (L/min) (Oxygen Therapy): 6    I have reviewed the vitals.    Input/Output    Intake/Output Summary (Last 24 hours) at 12/22/2023 1246  Last data filed at 12/22/2023 1200  Gross per 24 hour   Intake 360 ml   Output 6375 ml   Net -6015 ml       acetaminophen  (TYLENOL ) tablet, 650 mg, Oral, Q6H PRN  albuterol  (PROVENTIL ) 2.5 mg / 3 mL (0.083%) neb solution, 2.5 mg, Nebulization, Q6H PRN  budesonide  (PULMICORT  RESPULES) 0.5 mg/2 mL nebulizer suspension, 0.5 mg, Nebulization, 2x/day   And  arformoterol  (BROVANA ) 15 mcg/2 mL nebulizer solution, 15 mcg, Nebulization, 2x/day  aspirin  chewable tablet 81 mg, 81 mg, Oral, Daily  ciprofloxacin  (CIPRO ) tablet, 500 mg, Oral, 2x/day  clonazePAM  (klonoPIN ) tablet, 0.5 mg, Oral, 3x/day  Correction/SSIP insulin  lispro 100 units/mL injection, 4-18 Units, Subcutaneous, 4x/day AC  D5W 250 mL flush bag, , Intravenous, Q15 Min PRN  dextrose  (GLUTOSE) 40% oral gel, 15 g, Oral, Q15 Min PRN  dextrose  50% (0.5 g/mL) injection - syringe, 12.5 g, Intravenous, Q15 Min PRN  fluticasone  (FLONASE ) 50 mcg per spray nasal spray, 1 Spray, Each Nostril, 2x/day PRN  furosemide  (LASIX ) tablet, 40 mg, Oral, Daily  gabapentin  (NEURONTIN ) capsule, 400 mg, Oral, 2x/day  glucagon  (GLUCAGEN) injection 1 mg, 1 mg, IntraMUSCULAR, Once PRN  heparin  10,000 units/mL injection, 5,000  Units, Subcutaneous, Q8HRS  insulin  glargine 100 units/mL injection, 50 Units, Subcutaneous, 2x/day  insulin  glargine 100 units/mL injection, 20 Units, Subcutaneous, Once  insulin  lispro 100 units/mL injection, 10 Units, Subcutaneous, 3x/day AC  ipratropium (ATROVENT ) 0.02% nebulizer solution, 0.25 mg, Nebulization, Q6H  lactulose  (ENULOSE ) 10g per 15mL oral liquid, 30 mL, Oral, Daily  linaclotide  (LINZESS ) capsule, 290 mcg, Oral, QAM  metroNIDAZOLE  (FLAGYL ) tablet, 500 mg, Oral, 2x/day  micafungin  (MYCAMINE ) 150 mg in NS 100 mL IVPB, 150 mg, Intravenous, Q24H  nicotine  (NICODERM CQ ) transdermal patch (mg/24 hr), 14 mg, Transdermal, Q24H  nicotine  polacrilex (COMMIT) lozenge, 4 mg, Oral, Q1H PRN  NS 250 mL flush bag, , Intravenous, Q15 Min PRN  NS flush syringe, 3 mL, Intracatheter, Q8HRS  NS flush syringe, 3 mL, Intracatheter, Q1H PRN  ondansetron  (ZOFRAN ) 2 mg/mL injection, 4 mg, Intravenous, Q8H PRN  oxyCODONE  (ROXICODONE ) immediate release tablet, 5 mg, Oral, Q4H PRN  pantoprazole  (PROTONIX ) 4 mg/mL injection, 40 mg, Intravenous, 2x/day  phenol (CHLORASEPTIC) 1.4% oromucosal spray, 1 Spray, Mouth/Throat, Q4H PRN  rosuvastatin  (CRESTOR ) tablet, 20 mg, Oral, QPM  sennosides-docusate sodium  (SENOKOT-S) 8.6-50mg  per tablet, 1 Tablet, Oral, HS PRN  SUMAtriptan  (IMITREX ) tablet, 25 mg, Oral, Q24 H PRN         Physical Exam:  General: No acute distress  Cardiac:  Regular rate  Respiratory:  No increased work of breathing  Abdomen: Positive bowel sounds, soft, nontender  Extremities:  Right lower extremity wound VAC in place.    CBC (Last 24 Hours):    Recent Results last 24 hours     12/22/23  0741   WBC 10.1   HGB 14.8   HCT 45.4   MCV 94.0   PLTCNT 208         BMP (Last 24 Hours):    Recent Results last 24 hours     12/22/23  0741   SODIUM 135*   POTASSIUM 3.8   CHLORIDE 98   CO2 27   BUN 15   CREATININE 0.67   CALCIUM 8.7         I have reviewed all labs.    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 Rod on 11/26.  -the patient received Dalvance  on 11/25 no need to cover Gram-positive coverage at this time  -Tissue culture from 12/05/23 with growth of candida glabrata. OR cultures from 11/26 with rare yeast, pending speciation.  Plan:  -- vascular consulted, status post OR, wound VAC now in place.  -- Infectious disease consulted, following.  -- Continue oral antibiotics with ciprofloxacin  and Flagyl , Dalvance  outpatient  -- Initiated on IV Micafungin  for yeast osteomyelitis.      History of cardiac diastolic dysfunction  Questionable CAD  -monitor IO as patient appears euvolemic  -continue aspirin   -continue Lasix      Uncontrolled type 2 diabetes mellitus complicated by peripheral neuropathy  -sugars on admission over 400  -glargine increased to 80  -added lispro t.i.d. 10 units  -SSI aggressive  -adjust insulin  regimen as necessary   -continue gabapentin  b.i.d.     Chronic Obstructive Pulmonary Disease  -continue Pulmicort  and Brovana      Hypertension  -continue Lasix    -may benefit from Ace Arb given diabetic but blood pressure is within normal limits, may benefit from low-dose on discharge     Anxiety  -Klonopin       Tobacco use disorder   -nicotine  patch p.r.n.   -nicotine  lozenge     History of constipation   -continue Linzess , lactulose      gastroesophageal reflux disease   -Protonix             ___    DVT/PE Prophylaxis  - Heparin   Consults - ID and vascular surgery  Diet - regular, low calorie count  Disposition Planning - Home discharge     Yvette Schmidt Yvette Bastos, MD    This note may have been partially generated using MModal Fluency Direct system, and there may be some incorrect words, spellings, and punctuation that were not noted in checking the note before saving.

## 2023-12-22 NOTE — Consults (Signed)
 Gastroenterology Service [address], [place] [address]      Date:   12/22/2023  Name: Yvette Schmidt  Age: 48 y.o.  Length of stay: 3    Requesting Physician: Yvette Schmidt*  Primary Care Provider: Ripley Carmell Puller, MD    Chief Complaint:  Right foot infection  Reason for the consult: Yvette Cram, MD consulted for an opinion and treatment recommendations regarding Yvette Schmidt with positive occult emesis    Assessment and Plan:  Yvette Schmidt is a 48 y.o. female who presents with No chief complaint on file..  Past medical history is significant for asthma, type 2 diabetes, depression, GERD, anxiety, osteomyelitis.  Gastroenterology team is consulted for the following issues:     Positive occult emesis in setting of recurrent nausea and vomiting. Differential diagnosis includes esophagitis, gastritis, peptic ulcer disease.  Hemoglobin stable, unchanged since admission.  Hemodynamically stable.  Recurrent nausea and vomiting, most likely multifactorial etiology as below.  Acute NSAID use.  Daily NSAID use, Mobic .  Currently on PPI b.i.d. as outpatient.  Chronic constipation.  CT AP 12/07/2023 revealed diffuse colonic stool retention.  Uncontrolled type 2 diabetes.  Last hemoglobin A1c 11.6.  Question underlying component of gastroparesis.  Morbid obesity.  Hepatic steatosis.  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  Currently on aggressive antibiotic regimen.    My recommendations are as follows:    Okay from GI standpoint for diet as tolerated; consider trial of gastroparesis diet with any recurrent/persistent nausea and vomiting  Continue PPI b.i.d.  Antibiotics per primary service continue Linzess  and lactulose   KUB ordered; will follow  Consider trial/transition to Movantik/Relistor for constipation in setting of acute opioid use  Continue p.r.n. antiemetics  Limit narcotic use as feasible  May consider EGD prior to discharge based  on workup as above; will follow clinically.  Consider outpatient gastric emptying study if endoscopy reveals no concerns and symptoms persist; will discuss with GI follow-up appointment  Management of comorbidities per primary team.    Thank you for consulting us . Please call for any additional questions or acute changes. We will follow.      Attending attestation:  Plan for EGD prior to discharge based on work up . Symptomatic rx as above     I have personally seen and personally examined the patient.  I reviewed key elements of the HPI with Yvette Spell, FNP-BC.  The blood work up, imaging reports, assessment, plan of care, and recommendations were discussed with Yvette Spell, FNP-BC. I agree with the above assessment and plan with additions as mentioned above.     Yvette Schmidt M.D.  Gastroenterologist   [facility] - [facility]  [place], [place]  [address]     _________________________________________________________    History of Present Illness  Yvette Schmidt is a pleasent 48 y.o. female with past medical history as mentioned below. Patient was admitted for infection. The history was obtained from the patient and chart review.  HPI and admitting physician H&P were reviewed.    Patient presented to the emergency room after being seen by outpatient vascular surgery with concerns for worsening right foot infection.  Patient underwent debridement and washout of right foot osteomyelitis complicated by abscess on 12/07/2023.  Currently remains on aggressive antibiotic therapy.  GI consult placed for recurrent nausea and vomiting with episode of coffee-ground emesis overnight.  Occult study ordered by primary service which was positive.  Hemoglobin remains stable, relatively  unchanged since admission.  Hemodynamically stable.  Not currently on antiplatelets/anticoagulation therapy.  Is currently on DVT prophylaxis with subQ heparin .  No prior endoscopies with review of chart.  Per review  of medication list, patient is currently taking daily Mobic  and PRN NSAIDs.  Currently on Protonix  b.i.d. p.o..      Review of Systems  See HPI.     Past Medical History: She  has a past medical history of Asthma, COPD (chronic obstructive pulmonary disease), Diabetes mellitus, type 2, Gastroparesis, GERD (gastroesophageal reflux disease), Hypertension, and MRSA (methicillin resistant staph aureus) culture positive (10/10/2022).      Past Surgical History: She  has a past surgical history that includes pb colonoscopy,diagnostic; hx lap cholecystectomy (05/12/2017); hx hysterectomy (2012); and Foot Amputation.    Social History: Her  reports that she has been smoking cigarettes. She has a 7.5 pack-year smoking history. She has never used smokeless tobacco. She reports that she does not currently use alcohol. She reports that she does not currently use drugs after having used the following drugs: Marijuana., ,     Family History: She family history is not on file.    Allergies: She is allergic to beeswax, other, splenda [sucralose], sulfa (sulfonamides), theophylline, and hydrocodone.    Home Medication:   Medications Prior to Admission       Prescriptions    ACCU-CHEK GUIDE GLUCOSE METER Does not apply Misc    Use three times daily with lancets and test strips    albuterol  sulfate (PROVENTIL ) 2.5 mg /3 mL (0.083 %) Inhalation nebulizer solution    Inhale 3 mL (2.5 mg total) Via Nebulizer Every 6 hours as needed for Wheezing    albuterol  sulfate (VENTOLIN  HFA) 90 mcg/actuation Inhalation oral inhaler    INHALE 1-2 PUFFS BY INHALATION EVERY 6 HOURS AS NEEDED    amitriptyline  (ELAVIL ) 50 mg Oral Tablet    Take 1 Tablet (50 mg total) by mouth Every night for 90 days    aspirin  81 mg Oral Tablet, Chewable    Chew 1 Tablet (81 mg total) Daily    azelastine  (ASTELIN ) 137 mcg (0.1 %) Nasal Spray, Non-Aerosol    ADMINISTER 1 SPRAY INTO EACH NOSTRIL TWICE DAILY FOR 30 DAYS    betamethasone  valerate (VALISONE ) 0.1 % Ointment     Apply topically Twice daily    Blood Sugar Diagnostic (ACCU-CHEK GUIDE TEST STRIPS) Does not apply Strip    USE 1 STRIP TO CHECK BLOOD SUGAR FOUR TIMES DAILY BEFORE MEALS AND AT BEDTIME    cetirizine  (ZYRTEC ) 10 mg Oral Tablet    Take 1 Tablet (10 mg total) by mouth Daily for 90 days    clonazePAM  (KLONOPIN ) 0.5 mg Oral Tablet    Take 1 Tablet (0.5 mg total) by mouth Twice daily    EPINEPHrine  0.3 mg/0.3 mL Injection Auto-Injector    Inject 0.3 mL (0.3 mg total) into the muscle Once, as needed for up to 2 doses    fluticasone  propionate (FLONASE ) 50 mcg/actuation Nasal Spray, Suspension    Administer 2 Sprays into each nostril Twice daily    furosemide  (LASIX ) 40 mg Oral Tablet    Take 1 Tablet (40 mg total) by mouth Once a day    gabapentin  (NEURONTIN ) 800 mg Oral Tablet    Take 1/2 tablet by mouth in the morning and afternoon, then take 1 tablet in the evening.    Ibuprofen  (MOTRIN ) 200 mg Oral Tablet    Take 4 Tablets (800 mg total) by  mouth Four times a day as needed for Pain    insulin  aspart U-100 (NOVOLOG ) 100 unit/mL (3 mL) Subcutaneous Insulin  Pen    Inject under the skin Twice a day before meals per Sliding Scale. <150 No injection, 150-200 2 units, 201-250 4 units, 251-300 6 units, 301-350 9 units, 351-400 12 units    Insulin  Needles, Disposable, (SURE COMFORT PEN NEEDLE) 32 gauge x 1/4 Needle    USE TO INJECT INSULIN  TWICE DAILY    ipratropium (ATROVENT ) 0.02 % Inhalation Solution    Take 1.25 mL (0.25 mg total) by nebulization Every 6 hours    lactulose  (ENULOSE ) 10 gram/15 mL Oral Solution    Take 30 mL by mouth Daily for 30 days    Patient not taking:  Reported on 12/19/2023    Lancets (ACCU-CHEK SOFTCLIX LANCETS) Misc    Use to Check blood Sugar Three Times Daily    linaCLOtide  (LINZESS ) 290 mcg Oral Capsule    Take 1 Capsule (290 mcg total) by mouth Every morning    Patient taking differently:  Take 1 Capsule (290 mcg total) by mouth Every other day    meloxicam  (MOBIC ) 15 mg Oral Tablet    TAKE 1  TABLET BY MOUTH EVERY DAY AT BEDTIME    mineral oil-hydrophil petrolat (AQUAPHOR) Ointment    Apply topically Four times a day as needed    mupirocin  (BACTROBAN ) 2 % Ointment    Apply topically Three times a day for 30 days    Patient not taking:  Reported on 12/19/2023    Nebulizers Misc    Use with Nebulizer medication    oxyCODONE  (ROXICODONE ) 5 mg Oral Tablet    Take 2 Tablets (10 mg total) by mouth Every 4 hours as needed for up to 3 days    pantoprazole  (PROTONIX ) 40 mg Oral Tablet, Delayed Release (E.C.)    Take 1 Tablet (40 mg total) by mouth Twice daily    polyethylene glycol (MIRALAX ) 17 gram Oral Powder in Packet    Take 1 Packet (17 g total) by mouth Once per day as needed for up to 30 days    rosuvastatin  (CRESTOR ) 20 mg Oral Tablet    Take 1 Tablet (20 mg total) by mouth Every evening    saccharomyces boulardii (FLORASTOR) capsule    sennosides-docusate sodium  (SENOKOT-S) 8.6-50 mg Oral Tablet    Take 1 Tablet by mouth Twice daily for 30 days    simethicone  (MYLICON) 80 mg Oral Tablet, Chewable    Chew 1 Tablet (80 mg total) Every 6 hours as needed for up to 30 days    Patient not taking:  Reported on 12/19/2023    SITagliptin  phosphate (JANUVIA ) 100 mg Oral Tablet    Take 1 Tablet (100 mg total) by mouth Once a day    SUMAtriptan  (IMITREX ) 50 mg Oral Tablet    TAKE 1 TABLET BY MOUTH ONCE DAILY AS NEEDED FOR MIGRAINES MAY REPEAT IN 2 HOURS IF NEEDED FOR 30 DAYS DO NOT EXCEED MORE THAN 9 DOSES IN A 30 DAY PERIOD    SYMBICORT  160-4.5 mcg/actuation Inhalation oral inhaler    TAKE 2 PUFFS BY INHALATION TWICE DAILY    vitamin B complex  Oral Tablet    Take 1 Tablet by mouth Daily            Current Medications:   Current Facility-Administered Medications   Medication Dose Route Frequency    acetaminophen  (TYLENOL ) tablet  650 mg Oral Q6H PRN  albuterol  (PROVENTIL ) 2.5 mg / 3 mL (0.083%) neb solution  2.5 mg Nebulization Q6H PRN    budesonide  (PULMICORT  RESPULES) 0.5 mg/2 mL nebulizer suspension  0.5 mg  Nebulization 2x/day    And    arformoterol  (BROVANA ) 15 mcg/2 mL nebulizer solution  15 mcg Nebulization 2x/day    aspirin  chewable tablet 81 mg  81 mg Oral Daily    ciprofloxacin  (CIPRO ) tablet  500 mg Oral 2x/day    clonazePAM  (klonoPIN ) tablet  0.5 mg Oral 2x/day    Correction/SSIP insulin  lispro 100 units/mL injection  4-18 Units Subcutaneous 4x/day AC    D5W 250 mL flush bag   Intravenous Q15 Min PRN    dextrose  (GLUTOSE) 40% oral gel  15 g Oral Q15 Min PRN    dextrose  50% (0.5 g/mL) injection - syringe  12.5 g Intravenous Q15 Min PRN    fluticasone  (FLONASE ) 50 mcg per spray nasal spray  1 Spray Each Nostril 2x/day PRN    furosemide  (LASIX ) tablet  40 mg Oral Daily    gabapentin  (NEURONTIN ) capsule  400 mg Oral 2x/day    glucagon  (GLUCAGEN) injection 1 mg  1 mg IntraMUSCULAR Once PRN    heparin  10,000 units/mL injection  5,000 Units Subcutaneous Q8HRS    HYDROmorphone  (DILAUDID ) 0.5 mg/0.5 mL injection  0.3 mg Intravenous Q4H PRN    insulin  glargine 100 units/mL injection  80 Units Subcutaneous NIGHTLY    insulin  lispro 100 units/mL injection  10 Units Subcutaneous 3x/day AC    ipratropium (ATROVENT ) 0.02% nebulizer solution  0.25 mg Nebulization Q6H    lactulose  (ENULOSE ) 10g per 15mL oral liquid  30 mL Oral Daily    linaclotide  (LINZESS ) capsule  290 mcg Oral QAM    metroNIDAZOLE  (FLAGYL ) tablet  500 mg Oral 2x/day    nicotine  (NICODERM CQ ) transdermal patch (mg/24 hr)  14 mg Transdermal Q24H    nicotine  polacrilex (COMMIT) lozenge  4 mg Oral Q1H PRN    NS 250 mL flush bag   Intravenous Q15 Min PRN    NS flush syringe  3 mL Intracatheter Q8HRS    NS flush syringe  3 mL Intracatheter Q1H PRN    ondansetron  (ZOFRAN ) 2 mg/mL injection  4 mg Intravenous Q8H PRN    oxyCODONE  (ROXICODONE ) immediate release tablet  5 mg Oral Q4H PRN    pantoprazole  (PROTONIX ) 4 mg/mL injection  40 mg Intravenous 2x/day    phenol (CHLORASEPTIC) 1.4% oromucosal spray  1 Spray Mouth/Throat Q4H PRN    rosuvastatin  (CRESTOR ) tablet  20  mg Oral QPM    sennosides-docusate sodium  (SENOKOT-S) 8.6-50mg  per tablet  1 Tablet Oral HS PRN    SUMAtriptan  (IMITREX ) tablet  25 mg Oral Q24 H PRN        Examination:  BP (!) 127/94   Pulse 91   Temp 36.8 C (98.2 F)   Resp 17   Ht 1.651 m (5' 5)   Wt (!) 138 kg (303 lb 2.1 oz)   SpO2 (!) 89%   BMI 50.44 kg/m       General: Resting comfortably, no acute distress; obese  Eyes:  Conjunctiva normal, sclera non-icteric.  Abdomen:  Soft, non-tender, non-distended  Skin:  Skin warm and dry. No jaundice  Neurologic:  Alert, Oriented, Grossly normal  Psychiatric:  Appears normal    Data/Chart reviewed:    Previous labs reviewed?  yes     CBC:  Recent Labs     12/20/23  9386 12/21/23  0713 12/22/23  0741   WBC 9.3 10.3 10.1   HGB 14.7 14.7 14.8   HCT 44.5 44.3 45.4   PLTCNT 203 216 208       BMP:  Recent Labs     12/20/23  0613 12/21/23  0713 12/22/23  0741   SODIUM 136 133* 135*   POTASSIUM 4.1 4.1 3.8   CHLORIDE 102 99 98   CO2 23 24 27    BUN 9 11 15    CREATININE 0.62 0.65 0.67   GFR >90 >90 >90   CALCIUM 8.6 8.6 8.7       Coagulation Studies:  Recent Labs     12/20/23  0613   INR 1.01         Previous biopsy and pathology/EKG/outside records?  yes     Previous imaging reviewed?  yes     CT chest AP 12/07/2023:  1. Suspect some areas of atelectasis in the lungs. Otherwise no definite acute cardiopulmonary abnormality.  2. Hepatomegaly and fatty infiltration of the liver.  3. Diffuse colonic stool retention.  4. Fat-containing umbilical hernia.  _________________________________________________________  Note: A portion of this documentation was generated using MMODAL (voice recognition software) and may contain syntax/voice recognition errors. The above data may have been collected via chart review, discussion with other providers, review of medical records, discussion with the patients family/friends, and or obtained by the patient.  Not all of the above information was necessarily discussed with the patient.   The above stated documentation is for the medical record and communication/coordination of care for the patients best interest.  If the patient/family has any questions about the above stated material, this can be answered at the next scheduled visit or during hospital rounds if the patient is hospitalized.

## 2023-12-22 NOTE — Care Plan (Signed)
 [facility]  Rehabilitation Services  Physical Therapy Initial Evaluation    Patient Name: Yvette Schmidt  Date of Birth: 1975/05/06  Height: Height: 165.1 cm (5' 5)  Weight: Weight: (!) 138 kg (303 lb 2.1 oz)  Room/Bed: 4108/A  Payor: [employer]ICA[employer] / Plan: [employer] MEDICAID / Product Type: Medicaid [facility] /     Assessment:      Pt demonstrates overall limitiaitons in mobility and function. She is SBA for transfers. She ambulates 264ft with SBA and FWW. Pt reports FWW at home. Edicated pt on appropriate use of toe out shoe and WB. Pt requests for her sister to be trained on wound vac care due to her houseing location - nursing notified. PT recommend discharge home with home health and assist when medically stable.    Discharge Needs:    Equipment Recommendation: none anticipated       Discharge Disposition: home with home health, home with assist    JUSTIFICATION OF DISCHARGE RECOMMENDATION   Based on current diagnosis, functional performance prior to admission, and current functional performance, this patient requires continued PT services in home with home health, home with assist in order to achieve significant functional improvements in these deficit areas: aerobic capacity/endurance, gait, locomotion, and balance.        Plan:   Current Intervention: balance training, bed mobility training, gait training, stair training, strengthening, transfer training  To provide physical therapy services minimum of 2x/week  for duration of until discharge.    The risks/benefits of therapy have been discussed with the patient/caregiver and he/she is in agreement with the established plan of care.       Subjective & Objective        12/22/23 0923   Therapist Pager   PT Assigned/ Phone # Malcomb Gangemi [phone]   Rehab Session   Document Type evaluation   PT Visit Date 12/22/23   Total PT Minutes: 38  [phone])   Patient Effort good   Symptoms Noted During/After Treatment fatigue;increased pain;shortness of breath    General Information   Patient Profile Reviewed yes   Onset of Illness/Injury or Date of Surgery 12/19/23   Patient/Family/Caregiver Comments/Observations Pt agreeable to PT.   Pertinent History of Current Functional Problem Pt admitted for R foot infection. PMH includes COPD, asthma, DM, HTN, GERD, gastroparesis.   Medical Lines PIV Line;Telemetry   Respiratory Status room air   Existing Precautions/Restrictions fall precautions   Weight-bearing Status   Extremity Weight-bearing Status right lower extremity   Right Lower Extremity weight-bearing as tolerated (WBAT);other (see comments)  (in toe out shoe - no specific documentation of WB status per vascular)   Mutuality/Individual Preferences   Individualized Care Needs assist x 1 - FWW   Living Environment   Lives With spouse   Living Arrangements mobile home  (camper)   Home Assessment: No Problems Identified   Home Accessibility ramps present at home   Living Environment Comment Pt lives in camper with husband with ramp to enter.   Functional Level Prior   Ambulation 1 - assistive equipment   Transferring 0 - independent   Toileting 0 - independent   Bathing 0 - independent   Dressing 0 - independent   Eating 0 - independent   Communication 0 - understands/communicates without difficulty   Prior Functional Level Comment Pt reports ambulation with FWW ocasionally. She reportsi ndep with ADL/IADLs. Her sister has been wassisting her with wound care. Denies o2 use   Pre Treatment Status  Pre Treatment Patient Status Patient sitting on [facility]   Support Present Pre Treatment  Family present   Cognition   Behavior/Mood Observations behavior appropriate to situation, WNL/WFL;alert;cooperative   Orientation Status oriented x 4   Attention WNL/WFL   Follows Commands WFL   Vital Signs   O2 Delivery Pre Treatment room air   O2 Delivery Post Treatment room air   Pain Assessment   Pretreatment Pain Rating 0/10 - no pain   Posttreatment Pain Rating 0/10 - no pain    Pre/Posttreatment Pain Comment pt reports mild soreness and throbbing s/p ambulation   General Extremity Assessment   Comment generalized weakness and deconditioning, MMT grossly 4/5   Bed Mobility   Sit to Supine, Independence stand-by assistance   Bed Mobility, Assistive Device bed rails;Head of Bed Elevated   Safety Issues decreased use of legs for bridging/pushing;decreased use of arms for pushing/pulling;impaired trunk control for bed mobility   Impairments balance impaired;endurance;strength decreased   Transfer Assessment/Treatment   Sit-Stand Independence stand-by assistance   Stand-Sit Independence stand-by assistance   Sit-Stand-Sit, Assist Device walker, front wheeled   Toilet Transfer Independence stand-by assistance  [facility])   Toilet Transfer Assist Device other (see comments)  (none)   Transfer Safety Issues balance decreased during turns;step length decreased   Transfer Impairments balance impaired;endurance;postural control impaired;strength decreased   Gait Assessment/Treatment   Independence  stand-by assistance   Assistive Device  walker, front wheeled   Distance in Feet 200   Deviations  cadence decreased;double stance time increased;limb motion velocity decreased;step length decreased;stride length decreased;weight-shifting ability decreased   Safety Issues  balance decreased during turns;step length decreased   Impairments  balance impaired;endurance;postural control impaired;strength decreased   Balance   Sitting Balance: Static fair + balance   Sitting Balance: Dynamic fair + balance   Sit-to-Stand Balance fair balance   Standing Balance: Static fair balance   Standing Balance: Dynamic fair balance   Systems Impairment Contributing to Balance Disturbance musculoskeletal;neuromuscular   Identified Impairments Contributing to Balance Disturbance decreased strength   Post Treatment Status   Post Treatment Patient Status Patient sitting on edge of bed;Call light within reach;Telephone within  reach   Support Present Post Treatment  Family present   Communication Post Treatement Nurse   Communication Post Treatment Comment pt status   Plan of Care Review   Plan Of Care Reviewed With patient;spouse   Functional Impairment   Overall Functional Impairments/Problem List balance impaired;endurance;pain;strength decreased   Physical Therapy Clinical Impression   Assessment Pt demonstrates overall limitiaitons in mobility and function. She is SBA for transfers. She ambulates 242ft with SBA and FWW. Pt reports FWW at home. Edicated pt on appropriate use of toe out shoe and WB. Pt requests for her sister to be trained on wound vac care due to her houseing location - nursing notified. PT recommend discharge home with home health and assist when medically stable.   Criteria for Skilled Therapeutic yes   Pathology/Pathophysiology Noted neuromuscular;musculoskeletal   Impairments Found (describe specific impairments) aerobic capacity/endurance;gait, locomotion, and balance   Functional Limitations in Following  self-care;home management;community/leisure   Rehab Potential fair   Therapy Frequency minimum of 2x/week   Predicted Duration of Therapy Intervention (days/wks) until discharge   Anticipated Equipment Needs at Discharge (PT) none anticipated   Anticipated Discharge Disposition home with home health;home with assist   Evaluation Complexity Justification   Patient History: Co-morbidity/factors that impact Plan of Care 3 or more that impact Plan of Care  Examination Components 3 or more Exam elements addressed   Presentation Stable: Uncomplicated, straight-forward, problem focused   Clinical Decision Making Moderate complexity   Evaluation Complexity Moderate complexity   Care Plan Goals   PT Rehab Goals Bed Mobility Goal;Gait Training Goal;Stairs Training Goal;Transfer Training Goal   Bed Mobility Goal   Bed Mobility Goal, Date Established 12/22/23   Bed Mobility Goal, Time to Achieve by discharge   Bed  Mobility Goal, Activity Type all bed mobility activities   Bed Mobility Goal, Independence Level modified independence   Bed Mobility Goal, Assistive Device least restrictive assistive device   Gait Training  Goal, Distance to Achieve   Gait Training  Goal, Date Established 12/22/23   Gait Training  Goal, Time to Achieve by discharge   Gait Training  Goal, Independence Level modified independence   Gait Training  Goal, Assist Device least restricted assistive device   Gait Training  Goal, Distance to Achieve 300   Stairs Training Goal   Stairs Training Goal, Date Established 12/22/23   Stairs Training Goal, Time to Achieve by discharge   Stairs Training Goal, Independence Level modified independence   Stairs Training Goal, Assist Device least restrictive assistive device   Stairs Training Goal, Number of Stairs to Achieve 1   Transfer Training Goal   Transfer Training Goal, Date Established 12/22/23   Transfer Training Goal, Time to Achieve by discharge   Transfer Training Goal, Activity Type all transfers   Transfer Training Goal, Independence Level modified independence   Transfer Training Goal, Assist Device least restrictive assistive device   Planned Therapy Interventions, PT Eval   Planned Therapy Interventions (PT) balance training;bed mobility training;gait training;stair training;strengthening;transfer training       Therapist:   Greig Barge, PT 12/22/2023 13:37  Pager #: [phone]

## 2023-12-22 NOTE — Nurses Notes (Signed)
 Pt is alert and orient X4 during shift. Pts IV was dry and intact. Pt complained of 8/10 foot pain, Was given PRN dilaudid /Oxycodone  during shift. Pt has requested to speak to case management regarding D/C. Pt was free from falls during shift. Pts vitals were stable during shift. Pt is currently resting in bed. I have reviewed the plan of care. Will continue to monitor.    BP (!) 166/124   Pulse 97   Temp 36.8 C (98.2 F)   Resp 18   Ht 1.651 m (5' 5)   Wt (!) 138 kg (303 lb 2.1 oz)   SpO2 92%   BMI 50.44 kg/m     Lucious Howell, GN

## 2023-12-22 NOTE — Consults (Signed)
 Infectious Diseases  Initial Consult    [name]       [id number]       Date of service: 12/22/2023  Date of Admission:  12/19/2023  Hospital Day:  LOS: 3 days   History obtained from:  Patient and medical record  Reason for consult:  Osteomyelitis  Assessment/Recommendations:   Ms.Yvette Schmidt is a 48 y.o. 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  Patient was ultimately discharged to complete a course of IV dalbavancin.  Operative pathology noted persistent osteomyelitis at the bony margins.  Patient then directly readmitted from vascular surgery clinic due to poor wound healing.  Upon readmission she was taken back to the OR on 12/20/2023 and underwent right foot exploration with further debridement excisional removal of nonviable skin, subcutaneous tissue, fat, fascia, muscle, tendon with washout and open packing of the wounds.    Unfortunately previous operative cultures obtained 12/05/2023 are now also growing Candida glabrata.  Operative cultures obtained this admission on 12/20/2023 are pending with yeast seen on Gram stain.    Patient received IV dalbavancin on 12/19/2023, thus no indication for additional Gram-positive coverage.  Can continue empiric ciprofloxacin  and Flagyl  while following operative cultures.    Extensive discussion had at bedside with primary service present and patient's family present via speaker phone.  Discussed findings of fungal growth specifically Candida glabrata which is typically not responsive to azole therapy.  IV micafungin  150 mg initiated.  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  Also discussed pending operative cultures from  this admission.  Will further follow cultures for growth and susceptibilities to evaluate possible oral regimens.  Patient not a home IV antimicrobial candidate.    Continue local wound care per vascular surgery Service.  Wound VAC currently in place.    Case discussed with primary service.    HISTORY OF PRESENT ILLNESS:  Yvette Schmidt is a 48 y.o. female who was admitted to [facility] 12/19/2023 from vascular surgery clinic due to poor wound healing of the right foot.  Patient known to the Infectious Disease service due to prior hospitalization.  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  At time of discharge cultures were growing coagulase-negative staph and diphtheroids.  Operative pathology noted persistent osteomyelitis of the bony margins.  Patient refused wound VAC at time of discharge though did present for her IV dalbavancin infusions.  Patient received IV dalbavancin on 12/19/2023 and then presented to vascular surgery clinic before being directly admitted.  Upon readmission she was taken back to the OR with vascular surgery on 12/20/2023 and underwent right foot exploration with further debridement and excision of skin, subcutaneous tissue, fat, muscle, tendon with washout and open packing of the wound.  She has now had wound VAC placement.  Operative cultures are pending with yeast seen on Gram stain.  Previous operative cultures are now also growing Candida glabrata.          Past medical history, surgical history, social history and family history reviewed in Epic and with the patient.    Past Medical History:   Diagnosis Date  Asthma     COPD (chronic obstructive pulmonary disease)     Diabetes mellitus, type 2     Gastroparesis     GERD (gastroesophageal reflux disease)     Hypertension     MRSA (methicillin resistant staph aureus) culture positive 10/10/2022    Right  buttock wound           Past Surgical History:   Procedure Laterality Date    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 ovary retained    HX LAP CHOLECYSTECTOMY  05/12/2017    PB COLONOSCOPY,DIAGNOSTIC             Family Medical History:    None           Social History[1]    Allergies[2]    Outpatient Medications Marked as Taking for the 12/19/23 encounter [facility] Encounter)   Medication Sig    aspirin  81 mg Oral Tablet, Chewable Chew 1 Tablet (81 mg total) Daily    clonazePAM  (KLONOPIN ) 0.5 mg Oral Tablet Take 1 Tablet (0.5 mg total) by mouth Twice daily    fluticasone  propionate (FLONASE ) 50 mcg/actuation Nasal Spray, Suspension Administer 2 Sprays into each nostril Twice daily    furosemide  (LASIX ) 40 mg Oral Tablet Take 1 Tablet (40 mg total) by mouth Once a day    gabapentin  (NEURONTIN ) 800 mg Oral Tablet Take 1/2 tablet by mouth in the morning and afternoon, then take 1 tablet in the evening.    Ibuprofen  (MOTRIN ) 200 mg Oral Tablet Take 4 Tablets (800 mg total) by mouth Four times a day as needed for Pain    insulin  aspart U-100 (NOVOLOG ) 100 unit/mL (3 mL) Subcutaneous Insulin  Pen Inject under the skin Twice a day before meals per Sliding Scale. <150 No injection, 150-200 2 units, 201-250 4 units, 251-300 6 units, 301-350 9 units, 351-400 12 units    meloxicam  (MOBIC ) 15 mg Oral Tablet TAKE 1 TABLET BY MOUTH EVERY DAY AT BEDTIME    mineral oil-hydrophil petrolat (AQUAPHOR) Ointment Apply topically Four times a day as needed    pantoprazole  (PROTONIX ) 40 mg Oral Tablet, Delayed Release (E.C.) Take 1 Tablet (40 mg total) by mouth Twice daily    rosuvastatin  (CRESTOR ) 20 mg Oral Tablet Take 1 Tablet (20 mg total) by mouth Every evening    sennosides-docusate sodium  (SENOKOT-S) 8.6-50 mg Oral Tablet Take 1 Tablet by mouth Twice daily for 30 days    SITagliptin  phosphate (JANUVIA ) 100 mg Oral Tablet Take 1 Tablet (100 mg total) by mouth Once a day     vitamin B complex  Oral Tablet Take 1 Tablet by mouth Daily       Problem List[3]    REVIEW OF SYSTEMS:   Remainder of review of systems negative unless noted above.     PHYSICAL EXAMINATION:  Filed Vitals:    12/21/23 2307 12/21/23 2348 12/21/23 2355 12/22/23 0831   BP:   (!) 166/124 (!) 127/94   Pulse:   97 91   Resp: 18 16 18 17    Temp:   36.8 C (98.2 F) 36.8 C (98.2 F)   SpO2:   92% (!) 89%         Intake/Output Summary (Last 24 hours) at 12/22/2023 1400  Last data filed at 12/22/2023 1200  Gross per 24 hour   Intake 240 ml   Output 5475 ml   Net -  5235 ml       I have reviewed the vitals.  General:  Awake alert, asking to be discharged home.  Family and primary service present  HEENT: No scleral icterus or conjunctival injection. No posterior oropharynx erythema or exudate. No oral ulcer or thrush.  Trachea midline  CV: RRR without murmurs.   Respiratory:  Breathing comfortably, no tachypnea or increased work breathing  Abdomen: NABS, soft no tenderness to palpation all four quadrants. No palpable hepatosplenomegaly.   Extremities:  Wound VAC in place without surrounding erythema  Neurological:  Awake alert oriented  Skin:  No Other rashes or lesions noted          Labs:     Results for orders placed or performed during the hospital encounter of 12/19/23 (from the past 24 hours)   POC BLOOD GLUCOSE (RESULTS)    Collection Time: 12/21/23  5:16 PM   Result Value Ref Range    GLUCOSE, POC 501 (H) 70 - 110 mg/dl   POC BLOOD GLUCOSE (RESULTS)    Collection Time: 12/21/23  8:05 PM   Result Value Ref Range    GLUCOSE, POC 479 (H) 70 - 110 mg/dl   POC BLOOD GLUCOSE (RESULTS)    Collection Time: 12/22/23  5:14 AM   Result Value Ref Range    GLUCOSE, POC 346 (H) 70 - 110 mg/dl   CBC/DIFF    Collection Time: 12/22/23  7:41 AM    Narrative    The following orders were created for panel order CBC/DIFF.  Procedure                               Abnormality         Status                     ---------                                -----------         ------                     CBC WITH IPQQ[[id number]]                                    Final result                 Please view results for these tests on the individual orders.   BASIC METABOLIC PANEL    Collection Time: 12/22/23  7:41 AM   Result Value Ref Range    SODIUM 135 (L) 136 - 145 mmol/L    POTASSIUM 3.8 3.5 - 5.1 mmol/L    CHLORIDE 98 96 - 111 mmol/L    CO2 TOTAL 27 22 - 30 mmol/L    ANION GAP 10 4 - 13 mmol/L    CALCIUM 8.7 8.6 - 10.2 mg/dL    GLUCOSE 695 (H) 65 - 125 mg/dL    BUN 15 8 - 25 mg/dL    CREATININE 9.32 9.39 - 1.05 mg/dL    eGFRcr - FEMALE >09 >=60 mL/min/1.93m^2    BUN/CREA RATIO 22 6 - 22   MAGNESIUM     Collection Time: 12/22/23  7:41 AM   Result Value Ref Range    MAGNESIUM  1.8 1.8 -  2.6 mg/dL   PHOSPHORUS    Collection Time: 12/22/23  7:41 AM   Result Value Ref Range    PHOSPHORUS 3.6 2.4 - 4.7 mg/dL   CBC WITH DIFF    Collection Time: 12/22/23  7:41 AM   Result Value Ref Range    WBC 10.1 3.7 - 11.0 x10^3/uL    RBC 4.83 3.85 - 5.22 x10^6/uL    HGB 14.8 11.5 - 16.0 g/dL    HCT 54.5 65.1 - 53.9 %    MCV 94.0 78.0 - 100.0 fL    MCH 30.6 26.0 - 32.0 pg    MCHC 32.6 31.0 - 35.5 g/dL    RDW-CV 85.0 88.4 - 84.4 %    PLATELETS 208 150 - 400 x10^3/uL    MPV 10.8 8.7 - 12.5 fL    NEUTROPHIL % 58.6 %    LYMPHOCYTE % 31.0 %    MONOCYTE % 6.4 %    EOSINOPHIL % 3.1 %    BASOPHIL % 0.6 %    NEUTROPHIL # 5.93 1.50 - 7.70 x10^3/uL    LYMPHOCYTE # 3.14 1.00 - 4.80 x10^3/uL    MONOCYTE # 0.65 0.20 - 1.10 x10^3/uL    EOSINOPHIL # 0.31 <=0.50 x10^3/uL    BASOPHIL # <0.10 <=0.20 x10^3/uL    IMMATURE GRANULOCYTE % 0.3 0.0 - 1.0 %    IMMATURE GRANULOCYTE # <0.10 <0.10 x10^3/uL   BLOOD GAS Venous    Collection Time: 12/22/23  8:26 AM   Result Value Ref Range    %FIO2 (VENOUS) 21 %    PH (VENOUS) 7.42 7.32 - 7.43    PCO2 (VENOUS) 46 41 - 51 mm/Hg    PO2 (VENOUS) 63 35 - 50 mm/Hg    BICARBONATE (VENOUS) 28.3 22.0 - 29.0 mmol/L    BASE EXCESS 4.5 (H) 0.0 - 3.0 mmol/L    O2 SATURATION  (VENOUS) 92.2 40.0 - 85.0 %    Narrative    Manufacturer does not recommend venous sample for assessment of patient oxygenation status. A reference range for pO2, Oxyhemoglobin and Oxygen Saturation is provided but abnormal results will not flag in Epic.   POC BLOOD GLUCOSE (RESULTS)    Collection Time: 12/22/23 10:51 AM   Result Value Ref Range    GLUCOSE, POC 296 (H) 70 - 110 mg/dl       I have reviewed labs.    Current Medications[4]    Micro:   Hospital Encounter on 12/19/23 (from the past 96 hours)   TISSUE CULTURE (AEROBIC CULT & GRAM STAIN)    Collection Time: 12/20/23 10:31 AM    Specimen: Foot; Tissue   Culture Result Status    GRAM STAIN No WBCs (A) Preliminary    GRAM STAIN 1+ Rare Yeast (A) Preliminary       Radiology:    Results for orders placed or performed during the hospital encounter of 12/19/23 (from the past 72 hours)   XR KUB     Status: None    Narrative    MRS. Rayvon D Kreps  Female, 48 years old.    XR KUB performed on 12/22/2023 1:33 PM.    REASON FOR EXAM:  recurrent n/v; chronic constipation; acute opioid use    TECHNIQUE: 1 views/1 images submitted for interpretation.    COMPARISON:  12/14/2005.      Impression    FINDINGS/IMPRESSION:  No evidence for obstruction or ileus. Minimal stool retention. No renal calculi identified radiographically.  Radiologist location ID: [id number]              [facility], [place]    Patient was not seen or examined, I was available for and reviewed the patient's case and recommendations with H. Summerfield, PA-C.  I have reviewed the consult note and agree with the findings and plan of care as documented.  Any exceptions/additions have been edited/noted above.       Dorn Bars, DO  [facility] Infectious Diseases  Late entry for above encounter           [1]   Social History  Tobacco Use    Smoking status: Every Day     Current packs/day: 0.50     Average packs/day: 0.5 packs/day for 15.0 years (7.5 ttl pk-yrs)     Types: Cigarettes     Smokeless tobacco: Never   Substance Use Topics    Alcohol use: Not Currently     Comment: seldom    Drug use: Not Currently     Types: Marijuana     Comment: 2017   [2]   Allergies  Allergen Reactions    Beeswax      Allergic to Bee Stings    Other      Nutrasweet, sugar subs    Splenda [Sucralose]      ALL SUGAR SUBSTITUTES      Sulfa (Sulfonamides)      HIVES    Theophylline      ANXIETY    Hydrocodone Itching   [3]   Patient Active Problem List  Diagnosis    Asthma    Type 2 diabetes mellitus with peripheral neuropathy (CMS HCC)    Sleep Apnea    Depression    GERD    Headache    Migraine Headaches    Stress test    2-D echo    Abscess    Open wound    Insomnia    GAD (generalized anxiety disorder)    Rash and other nonspecific skin eruption    Gas gangrene of foot (CMS HCC)    Osteomyelitis, unspecified site, unspecified type (CMS HCC)    Acute osteomyelitis of metatarsal bone of right foot (CMS HCC)    H/O laparoscopic partial gastrectomy    Right foot infection   [4]   Current Facility-Administered Medications:     acetaminophen  (TYLENOL ) tablet, 650 mg, Oral, Q6H PRN, Zegeer, Joshua, MD, 650 mg at 12/20/23 0358    albuterol  (PROVENTIL ) 2.5 mg / 3 mL (0.083%) neb solution, 2.5 mg, Nebulization, Q6H PRN, Jann Maria, MD    budesonide  (PULMICORT  RESPULES) 0.5 mg/2 mL nebulizer suspension, 0.5 mg, Nebulization, 2x/day, 0.5 mg at 12/22/23 0734 **AND** arformoterol  (BROVANA ) 15 mcg/2 mL nebulizer solution, 15 mcg, Nebulization, 2x/day, Jann Maria, MD, 15 mcg at 12/22/23 9264    aspirin  chewable tablet 81 mg, 81 mg, Oral, Daily, Jann Maria, MD, 81 mg at 12/22/23 1020    ciprofloxacin  (CIPRO ) tablet, 500 mg, Oral, 2x/day, Jann Maria, MD, 500 mg at 12/22/23 1020    clonazePAM  (klonoPIN ) tablet, 0.5 mg, Oral, 3x/day, Villarreal Aguirre, Leonardo, MD    Correction/SSIP insulin  lispro 100 units/mL injection, 4-18 Units, Subcutaneous, 4x/day AC, Villarreal Aguirre, Leonardo, MD, 7 Units at  12/22/23 1122    D5W 250 mL flush bag, , Intravenous, Q15 Min PRN, Jann Maria, MD    dextrose  (GLUTOSE) 40% oral gel, 15 g, Oral, Q15 Min PRN, Jann Maria, MD    dextrose  50% (0.5 g/mL) injection -  syringe, 12.5 g, Intravenous, Q15 Min PRN, Jann Maria, MD    fluticasone  (FLONASE ) 50 mcg per spray nasal spray, 1 Spray, Each Nostril, 2x/day PRN, Jann Maria, MD, 1 Spray at 12/21/23 1257    furosemide  (LASIX ) tablet, 40 mg, Oral, Daily, Jann Maria, MD, 40 mg at 12/21/23 1315    gabapentin  (NEURONTIN ) capsule, 400 mg, Oral, 2x/day, Jann Maria, MD, 400 mg at 12/22/23 1020    glucagon  (GLUCAGEN) injection 1 mg, 1 mg, IntraMUSCULAR, Once PRN, Jann Maria, MD    heparin  10,000 units/mL injection, 5,000 Units, Subcutaneous, Q8HRS, Jann Maria, MD    insulin  glargine 100 units/mL injection, 50 Units, Subcutaneous, 2x/day, Villarreal Aguirre, Leonardo, MD    insulin  glargine 100 units/mL injection, 20 Units, Subcutaneous, Once, Janita Bastos, Leonardo, MD    insulin  lispro 100 units/mL injection, 10 Units, Subcutaneous, 3x/day THEOPOLIS Jann Maria, MD, 10 Units at 12/22/23 1121    ipratropium (ATROVENT ) 0.02% nebulizer solution, 0.25 mg, Nebulization, Q6H, Jann Maria, MD, 0.25 mg at 12/22/23 9265    lactulose  (ENULOSE ) 10g per 15mL oral liquid, 30 mL, Oral, Daily, Jann Maria, MD    linaclotide  (LINZESS ) capsule, 290 mcg, Oral, QAM, Estrada, Alejandro, MD, 290 mcg at 12/22/23 9367    metroNIDAZOLE  (FLAGYL ) tablet, 500 mg, Oral, 2x/day, Jann Maria, MD, 500 mg at 12/22/23 1020    micafungin  (MYCAMINE ) 150 mg in NS 100 mL IVPB, 150 mg, Intravenous, Q24H, Summerfield, Haley May, PA-C    nicotine  (NICODERM CQ ) transdermal patch (mg/24 hr), 14 mg, Transdermal, Q24H, Jann Maria, MD, 14 mg at 12/21/23 1720    nicotine  polacrilex (COMMIT) lozenge, 4 mg, Oral, Q1H PRN, Jann Maria, MD, 4 mg at 12/21/23 1520    NS 250 mL flush bag, ,  Intravenous, Q15 Min PRN, Jann Maria, MD    NS flush syringe, 3 mL, Intracatheter, Q8HRS, Jann Maria, MD, 3 mL at 12/21/23 2111    NS flush syringe, 3 mL, Intracatheter, Q1H PRN, Jann Maria, MD    ondansetron  (ZOFRAN ) 2 mg/mL injection, 4 mg, Intravenous, Q8H PRN, Zegeer, Joshua, MD, 4 mg at 12/21/23 2207    oxyCODONE  (ROXICODONE ) immediate release tablet, 5 mg, Oral, Q4H PRN, Jann Maria, MD, 5 mg at 12/21/23 2207    pantoprazole  (PROTONIX ) 4 mg/mL injection, 40 mg, Intravenous, 2x/day, Zegeer, Joshua, MD, 40 mg at 12/22/23 1020    phenol (CHLORASEPTIC) 1.4% oromucosal spray, 1 Spray, Mouth/Throat, Q4H PRN, Jann Maria, MD, 1 Spray at 12/21/23 1257    rosuvastatin  (CRESTOR ) tablet, 20 mg, Oral, QPM, Jann Maria, MD, 20 mg at 12/21/23 2111    sennosides-docusate sodium  (SENOKOT-S) 8.6-50mg  per tablet, 1 Tablet, Oral, HS PRN, Jann Maria, MD    SUMAtriptan  (IMITREX ) tablet, 25 mg, Oral, Q24 H PRN, Jann Maria, MD, 25 mg at 12/21/23 1718

## 2023-12-22 NOTE — Care Plan (Signed)
 Patient alert and oriented this shift. Complaints of pain voiced. PRN Oxy administered. Vac in place. Functioning WNL. Blood glucose monitoring continues. Insulin  administered per order. Up w asst. Plan of care to continue.     BP (Non-Invasive): (!) 137/93 Temperature: 37.1 C (98.8 F) Heart Rate: 94 Respiratory Rate: 18 SpO2: 92 %  Height: 165.1 cm (5' 5) Weight: (!) 138 kg (303 lb 2.1 oz) Body mass index is 50.44 kg/m.    I have reviewed this patient's orders and plan of care.  Currently this patient meets requirements for a mid to high of nursing care.  The patient will continue to be monitored and re-evaluated for any changes. Cena Hush, RN

## 2023-12-23 ENCOUNTER — Inpatient Hospital Stay (HOSPITAL_COMMUNITY): Payer: MEDICAID

## 2023-12-23 DIAGNOSIS — M86171 Other acute osteomyelitis, right ankle and foot: Secondary | ICD-10-CM

## 2023-12-23 DIAGNOSIS — M25562 Pain in left knee: Secondary | ICD-10-CM

## 2023-12-23 DIAGNOSIS — M79671 Pain in right foot: Secondary | ICD-10-CM

## 2023-12-23 DIAGNOSIS — F1190 Opioid use, unspecified, uncomplicated: Secondary | ICD-10-CM

## 2023-12-23 LAB — BASIC METABOLIC PANEL
ANION GAP: 11 mmol/L (ref 4–13)
BUN/CREA RATIO: 23 — ABNORMAL HIGH (ref 6–22)
BUN: 16 mg/dL (ref 8–25)
CALCIUM: 9.1 mg/dL (ref 8.6–10.2)
CHLORIDE: 99 mmol/L (ref 96–111)
CO2 TOTAL: 22 mmol/L (ref 22–30)
CREATININE: 0.69 mg/dL (ref 0.60–1.05)
GLUCOSE: 332 mg/dL — ABNORMAL HIGH (ref 65–125)
POTASSIUM: 4 mmol/L (ref 3.5–5.1)
SODIUM: 132 mmol/L — ABNORMAL LOW (ref 136–145)
eGFRcr - FEMALE: >90 mL/min/1.73mˆ2 (ref >=60)

## 2023-12-23 LAB — CBC WITH DIFF
BASOPHIL #: <0.1 x10ˆ3/uL (ref <=0.20)
BASOPHIL %: 0.7 %
EOSINOPHIL #: 0.42 x10ˆ3/uL (ref <=0.50)
EOSINOPHIL %: 4 %
HCT: 48 % — ABNORMAL HIGH (ref 34.8–46.0)
HGB: 15.6 g/dL (ref 11.5–16.0)
IMMATURE GRANULOCYTE #: <0.1 x10ˆ3/uL (ref <0.10)
IMMATURE GRANULOCYTE %: 0.4 % (ref 0.0–1.0)
LYMPHOCYTE #: 3.18 x10ˆ3/uL (ref 1.00–4.80)
LYMPHOCYTE %: 30.1 %
MCH: 30.5 pg (ref 26.0–32.0)
MCHC: 32.5 g/dL (ref 31.0–35.5)
MCV: 93.9 fL (ref 78.0–100.0)
MONOCYTE #: 0.74 x10ˆ3/uL (ref 0.20–1.10)
MONOCYTE %: 7 %
MPV: 11 fL (ref 8.7–12.5)
NEUTROPHIL #: 6.13 x10ˆ3/uL (ref 1.50–7.70)
NEUTROPHIL %: 57.8 %
PLATELETS: 201 x10ˆ3/uL (ref 150–400)
RBC: 5.11 x10ˆ6/uL (ref 3.85–5.22)
RDW-CV: 14.7 % (ref 11.5–15.5)
WBC: 10.6 x10ˆ3/uL (ref 3.7–11.0)

## 2023-12-23 LAB — POC BLOOD GLUCOSE (RESULTS)
GLUCOSE, POC: 360 mg/dL — ABNORMAL HIGH (ref 70–110)
GLUCOSE, POC: 402 mg/dL — ABNORMAL HIGH (ref 70–110)
GLUCOSE, POC: 466 mg/dL — ABNORMAL HIGH (ref 70–110)
GLUCOSE, POC: 455 mg/dL — ABNORMAL HIGH (ref 70–110)

## 2023-12-23 LAB — PHOSPHORUS: PHOSPHORUS: 3.4 mg/dL (ref 2.4–4.7)

## 2023-12-23 LAB — MAGNESIUM: MAGNESIUM: 1.7 mg/dL — ABNORMAL LOW (ref 1.8–2.6)

## 2023-12-23 MED ORDER — NICOTINE 21 MG/24 HR DAILY TRANSDERMAL PATCH
21.0000 mg | MEDICATED_PATCH | TRANSDERMAL | Status: DC
  Administered 2023-12-23 – 2023-12-27 (×5): 21 mg via TRANSDERMAL
  Filled 2023-12-23 (×5): qty 1

## 2023-12-23 MED ORDER — CETIRIZINE 10 MG TABLET
10.0000 mg | ORAL_TABLET | Freq: Every day | ORAL | Status: DC
  Administered 2023-12-23 – 2023-12-27 (×5): 10 mg via ORAL
  Filled 2023-12-23 (×5): qty 1

## 2023-12-23 MED ORDER — KETOROLAC 15 MG/ML INJECTION SOLUTION
15.0000 mg | Freq: Four times a day (QID) | INTRAMUSCULAR | Status: AC | PRN
Start: 2023-12-23 — End: 2023-12-26
  Administered 2023-12-23 – 2023-12-25 (×3): 15 mg via INTRAVENOUS
  Filled 2023-12-23 (×3): qty 1

## 2023-12-23 MED ORDER — AZELASTINE 137 MCG (0.1 %) NASAL SPRAY
1.0000 | Freq: Two times a day (BID) | NASAL | Status: DC
  Administered 2023-12-23 – 2023-12-27 (×9): 1 via NASAL
  Filled 2023-12-23: qty 30

## 2023-12-23 MED ORDER — INSULIN LISPRO 100 UNIT/ML SUB-Q - CHARGE BY DOSE
13.0000 [IU] | Freq: Three times a day (TID) | SUBCUTANEOUS | Status: DC
  Administered 2023-12-23 – 2023-12-27 (×14): 13 [IU] via SUBCUTANEOUS
  Filled 2023-12-23 (×14): qty 13

## 2023-12-23 MED ORDER — MAGNESIUM OXIDE 400 MG (241.3 MG MAGNESIUM) TABLET
400.0000 mg | ORAL_TABLET | Freq: Once | ORAL | Status: AC
Start: 2023-12-23 — End: 2023-12-23
  Administered 2023-12-23: 400 mg via ORAL
  Filled 2023-12-23: qty 1

## 2023-12-23 MED ORDER — INSULIN LISPRO 100 UNIT/ML SUB-Q - CHARGE BY DOSE
4.0000 [IU] | Freq: Once | SUBCUTANEOUS | Status: AC
Start: 2023-12-23 — End: 2023-12-23
  Administered 2023-12-23: 4 [IU] via SUBCUTANEOUS
  Filled 2023-12-23: qty 4

## 2023-12-23 MED ORDER — OXYCODONE 5 MG TABLET
10.0000 mg | ORAL_TABLET | ORAL | Status: DC | PRN
  Administered 2023-12-23 – 2023-12-26 (×10): 10 mg via ORAL
  Administered 2023-12-26: 0 mg via ORAL
  Administered 2023-12-26 – 2023-12-27 (×4): 10 mg via ORAL
  Filled 2023-12-23 (×15): qty 2

## 2023-12-23 NOTE — Care Plan (Signed)
 Problem: Wound  Goal: Optimal Coping  Outcome: Ongoing (see interventions/notes)  Intervention: Support Patient and Family Response  Recent Flowsheet Documentation  Taken 12/22/2023 2013 by Courtney-Dee K, RN  Family/Support System Care:   involvement promoted   presence promoted   self-care encouraged   support provided  Supportive Measures:   active listening utilized   decision-making supported   self-care encouraged  Goal: Optimal Functional Ability  Outcome: Ongoing (see interventions/notes)  Intervention: Optimize Functional Ability  Recent Flowsheet Documentation  Taken 12/22/2023 2013 by Courtney-Dee K, RN  Activity Management:   ROM, active encouraged   up ad lib   sitting, edge of bed  Goal: Absence of Infection Signs and Symptoms  Outcome: Ongoing (see interventions/notes)  Intervention: Prevent or Manage Infection  Recent Flowsheet Documentation  Taken 12/22/2023 2013 by Courtney-Dee K, RN  Fever Reduction/Comfort Measures:   lightweight bedding   lightweight clothing  Infection Management: aseptic technique maintained  Goal: Improved Oral Intake  Outcome: Ongoing (see interventions/notes)  Intervention: Promote and Optimize Oral Intake  Recent Flowsheet Documentation  Taken 12/22/2023 2013 by Courtney-Dee K, RN  Oral Nutrition Promotion:   rest periods promoted   social interaction promoted  Goal: Optimal Pain Control and Function  Outcome: Ongoing (see interventions/notes)  Intervention: Prevent or Manage Pain  Recent Flowsheet Documentation  Taken 12/22/2023 2013 by Courtney-Dee K, RN  Sleep/Rest Enhancement:   awakenings minimized   consistent schedule promoted   family presence promoted   regular sleep/rest pattern promoted   room darkened  Goal: Skin Health and Integrity  Outcome: Ongoing (see interventions/notes)  Intervention: Optimize Skin Protection  Recent Flowsheet Documentation  Taken 12/23/2023 0049 by Margretta POUR, RN  Head of Bed [facility]) Positioning: HOB elevated  Taken 12/22/2023 2252  by Courtney-Dee K, RN  Head of Bed [facility]) Positioning: HOB elevated  Taken 12/22/2023 2013 by Courtney-Dee K, RN  Pressure Reduction Techniques: Frequent weight shifting encouraged  Pressure Reduction Devices: Repositioning wedges/pillows utilized  Activity Management:   ROM, active encouraged   up ad lib   sitting, edge of bed  Head of Bed (HOB) Positioning: HOB at 60 degrees  Goal: Optimal Wound Healing  Outcome: Ongoing (see interventions/notes)  Intervention: Promote Wound Healing  Recent Flowsheet Documentation  Taken 12/22/2023 2013 by Courtney-Dee K, RN  Oral Nutrition Promotion:   rest periods promoted   social interaction promoted  Pressure Reduction Techniques: Frequent weight shifting encouraged  Pressure Reduction Devices: Repositioning wedges/pillows utilized  Sleep/Rest Enhancement:   awakenings minimized   consistent schedule promoted   family presence promoted   regular sleep/rest pattern promoted   room darkened  Activity Management:   ROM, active encouraged   up ad lib   sitting, edge of bed     Problem: Adult Inpatient Plan of Care  Goal: Absence of Hospital-Acquired Illness or Injury  Outcome: Ongoing (see interventions/notes)  Intervention: Identify and Manage Fall Risk  Recent Flowsheet Documentation  Taken 12/23/2023 0049 by Margretta POUR, RN  Safety Promotion/Fall Prevention:   activity supervised   fall prevention program maintained   safety round/check completed  Taken 12/22/2023 2252 by Courtney-Dee K, RN  Safety Promotion/Fall Prevention:   activity supervised   fall prevention program maintained   safety round/check completed  Taken 12/22/2023 2013 by Courtney-Dee K, RN  Safety Promotion/Fall Prevention:   activity supervised   fall prevention program maintained   safety round/check completed  Intervention: Prevent Skin Injury  Recent Flowsheet Documentation  Taken 12/23/2023 0049 by Margretta POUR, RN  Body Position: supine, head elevated  Taken 12/22/2023 2252 by Courtney-Dee K,  RN  Body Position: supine, head elevated  Taken 12/22/2023 2013 by Courtney-Dee K, RN  Body Position: sitting  Skin Protection:   adhesive use limited   incontinence pads utilized   transparent dressing maintained   tubing/devices free from skin contact  Intervention: Prevent and Manage VTE (Venous Thromboembolism) Risk  Recent Flowsheet Documentation  Taken 12/22/2023 2013 by Courtney-Dee K, RN  VTE Prevention/Management:   ambulation promoted   dorsiflexion/plantar flexion performed  Intervention: Prevent Infection  Recent Flowsheet Documentation  Taken 12/23/2023 0049 by Courtney-Dee K, RN  Infection Prevention: barrier precautions utilized  Taken 12/22/2023 2252 by Courtney-Dee K, RN  Infection Prevention: barrier precautions utilized  Taken 12/22/2023 2013 by Courtney-Dee K, RN  Infection Prevention:   barrier precautions utilized   equipment surfaces disinfected   glycemic control managed   personal protective equipment utilized   promote handwashing   rest/sleep promoted   single patient room provided  Goal: Optimal Comfort and Wellbeing  Outcome: Ongoing (see interventions/notes)  Intervention: Provide Person-Centered Care  Recent Flowsheet Documentation  Taken 12/22/2023 2013 by Margretta POUR, RN  Trust Relationship/Rapport:   care explained   choices provided   questions answered   questions encouraged   thoughts/feelings acknowledged  Goal: Rounds/Family Conference  Outcome: Ongoing (see interventions/notes)     Problem: Health Knowledge, Opportunity to Enhance (Adult,Obstetrics,Pediatric)  Goal: Knowledgeable about Health Subject/Topic  Description: Patient will demonstrate the desired outcomes by discharge/transition of care.  Outcome: Ongoing (see interventions/notes)  Intervention: Enhance Health Knowledge  Recent Flowsheet Documentation  Taken 12/22/2023 2013 by Courtney-Dee K, RN  Family/Support System Care:   involvement promoted   presence promoted   self-care encouraged   support  provided  Supportive Measures:   active listening utilized   decision-making supported   self-care encouraged     Problem: Surgery Nonspecified  Goal: Absence of Bleeding  Outcome: Ongoing (see interventions/notes)  Intervention: Monitor and Manage Bleeding  Recent Flowsheet Documentation  Taken 12/22/2023 2013 by Courtney-Dee K, RN  Bleeding Management: dressing monitored  Goal: Effective Bowel Elimination  Outcome: Ongoing (see interventions/notes)  Intervention: Enhance Bowel Motility and Elimination  Recent Flowsheet Documentation  Taken 12/22/2023 2013 by Courtney-Dee K, RN  Bowel Motility Enhancement:   ambulation promoted   fluid intake encouraged   oral intake encouraged  Goal: Fluid and Electrolyte Balance  Outcome: Ongoing (see interventions/notes)  Intervention: Monitor and Manage Fluid and Electrolyte Balance  Recent Flowsheet Documentation  Taken 12/22/2023 2013 by Courtney-Dee K, RN  Fluid/Electrolyte Management: fluids provided  Goal: Blood Glucose Level Within Target Range  Outcome: Ongoing (see interventions/notes)  Goal: Absence of Infection Signs and Symptoms  Outcome: Ongoing (see interventions/notes)  Intervention: Prevent or Manage Infection  Recent Flowsheet Documentation  Taken 12/22/2023 2013 by Courtney-Dee K, RN  Fever Reduction/Comfort Measures:   lightweight bedding   lightweight clothing  Infection Management: aseptic technique maintained  Goal: Anesthesia/Sedation Recovery  Outcome: Ongoing (see interventions/notes)  Intervention: Optimize Anesthesia Recovery  Recent Flowsheet Documentation  Taken 12/23/2023 0049 by Margretta POUR, RN  Safety Promotion/Fall Prevention:   activity supervised   fall prevention program maintained   safety round/check completed  Taken 12/22/2023 2252 by Courtney-Dee K, RN  Safety Promotion/Fall Prevention:   activity supervised   fall prevention program maintained   safety round/check completed  Taken 12/22/2023 2013 by Courtney-Dee K, RN  Safety  Promotion/Fall Prevention:   activity supervised   fall prevention program  maintained   safety round/check completed  Reorientation Measures:   clock in view   familiar social contact encouraged  Stabilization Measures: legs elevated  Goal: Optimal Pain Control and Function  Outcome: Ongoing (see interventions/notes)  Intervention: Prevent or Manage Pain  Recent Flowsheet Documentation  Taken 12/22/2023 2013 by Courtney-Dee K, RN  Diversional Activities:   television   smartphone   puzzles  Goal: Nausea and Vomiting Relief  Outcome: Ongoing (see interventions/notes)  Goal: Effective Urinary Elimination  Outcome: Ongoing (see interventions/notes)  Intervention: Monitor and Manage Urinary Retention  Recent Flowsheet Documentation  Taken 12/22/2023 2013 by Courtney-Dee K, RN  Urinary Elimination Promotion:   absorbent pad/diaper use encouraged   toileting device within reach   frequent voiding encouraged  Goal: Effective Oxygenation and Ventilation  Outcome: Ongoing (see interventions/notes)  Intervention: Optimize Oxygenation and Ventilation  Recent Flowsheet Documentation  Taken 12/23/2023 0049 by Margretta POUR, RN  Head of Bed [facility]) Positioning: HOB elevated  Taken 12/22/2023 2252 by Courtney-Dee K, RN  Head of Bed [facility]) Positioning: HOB elevated  Taken 12/22/2023 2013 by Courtney-Dee K, RN  Head of Bed (HOB) Positioning: HOB at 60 degrees     Problem: Comorbidity Management  Goal: Maintenance of Asthma Control  Outcome: Ongoing (see interventions/notes)  Intervention: Maintain Asthma Symptom Control  Recent Flowsheet Documentation  Taken 12/23/2023 0049 by Margretta POUR, RN  Medication Review/Management: medications reviewed  Taken 12/22/2023 2252 by Courtney-Dee K, RN  Medication Review/Management: medications reviewed  Taken 12/22/2023 2013 by Courtney-Dee K, RN  Medication Review/Management: medications reviewed  Goal: Maintenance of COPD Symptom Control  Outcome: Ongoing (see  interventions/notes)  Intervention: Maintain COPD (Chronic Obstructive Pulmonary Disease) Symptom Control  Recent Flowsheet Documentation  Taken 12/23/2023 0049 by Margretta POUR, RN  Medication Review/Management: medications reviewed  Taken 12/22/2023 2252 by Courtney-Dee K, RN  Medication Review/Management: medications reviewed  Taken 12/22/2023 2013 by Courtney-Dee K, RN  Medication Review/Management: medications reviewed  Goal: Blood Glucose Level Within Target Range  Outcome: Ongoing (see interventions/notes)  Intervention: Monitor and Manage Glycemia  Recent Flowsheet Documentation  Taken 12/23/2023 0049 by Courtney-Dee K, RN  Medication Review/Management: medications reviewed  Taken 12/22/2023 2252 by Courtney-Dee K, RN  Medication Review/Management: medications reviewed  Taken 12/22/2023 2013 by Courtney-Dee K, RN  Medication Review/Management: medications reviewed  Goal: Blood Pressure in Desired Range  Outcome: Ongoing (see interventions/notes)  Intervention: Maintain Blood Pressure Management  Recent Flowsheet Documentation  Taken 12/23/2023 0049 by Margretta POUR, RN  Medication Review/Management: medications reviewed  Taken 12/22/2023 2252 by Courtney-Dee K, RN  Medication Review/Management: medications reviewed  Taken 12/22/2023 2013 by Courtney-Dee K, RN  Medication Review/Management: medications reviewed       Courtney-Dee Sharonlee Nine, RN

## 2023-12-23 NOTE — Nurses Notes (Signed)
 Patient is alert and oriented x4, IV is patent, clean, dry, and intact. O2 sats within normal ranges on RA. Ambulates standby assist with R toe-out shoe. Blood glucose monitored, ordered Insulin  administered as needed. Wound Vac remains in place with ordered settings to R foot and is clean, dry, and intact. Patient rested well throughout the shift and remained in bed; some complaints of pain, PRN medication administered as needed. Husband at bedside attentive to patient and participating in plan of care. Currently this patient meets requirements for a low to mid level of nursing care. The patient will continue to be monitored and re-evaluated for any changes.      Courtney-Dee Jadira Nierman, RN

## 2023-12-23 NOTE — Care Plan (Signed)
 Problem: Wound  Goal: Optimal Coping  Outcome: Ongoing (see interventions/notes)  Intervention: Support Patient and Family Response  Recent Flowsheet Documentation  Taken 12/23/2023 1952 by Courtney-Dee K, RN  Family/Support System Care:   involvement promoted   presence promoted   self-care encouraged   support provided  Supportive Measures:   active listening utilized   decision-making supported   self-care encouraged  Goal: Optimal Functional Ability  Outcome: Ongoing (see interventions/notes)  Intervention: Optimize Functional Ability  Recent Flowsheet Documentation  Taken 12/23/2023 1952 by Courtney-Dee K, RN  Activity Management:   ROM, active encouraged   up ad lib  Goal: Absence of Infection Signs and Symptoms  Outcome: Ongoing (see interventions/notes)  Intervention: Prevent or Manage Infection  Recent Flowsheet Documentation  Taken 12/23/2023 1952 by Courtney-Dee K, RN  Fever Reduction/Comfort Measures:   lightweight bedding   lightweight clothing  Infection Management: aseptic technique maintained  Goal: Improved Oral Intake  Outcome: Ongoing (see interventions/notes)  Intervention: Promote and Optimize Oral Intake  Recent Flowsheet Documentation  Taken 12/23/2023 1952 by Courtney-Dee K, RN  Oral Nutrition Promotion:   rest periods promoted   social interaction promoted  Goal: Optimal Pain Control and Function  Outcome: Ongoing (see interventions/notes)  Intervention: Prevent or Manage Pain  Recent Flowsheet Documentation  Taken 12/23/2023 1952 by Courtney-Dee K, RN  Sleep/Rest Enhancement:   awakenings minimized   consistent schedule promoted   family presence promoted   regular sleep/rest pattern promoted   room darkened  Goal: Skin Health and Integrity  Outcome: Ongoing (see interventions/notes)  Intervention: Optimize Skin Protection  Recent Flowsheet Documentation  Taken 12/23/2023 2240 by Courtney-Dee K, RN  Head of Bed [facility]) Positioning: HOB elevated  Taken 12/23/2023 1952 by Courtney-Dee K,  RN  Pressure Reduction Techniques:   Frequent weight shifting encouraged   Heels elevated off of the bed  Pressure Reduction Devices: Repositioning wedges/pillows utilized  Activity Management:   ROM, active encouraged   up ad lib  Head of Bed (HOB) Positioning: HOB elevated  Goal: Optimal Wound Healing  Outcome: Ongoing (see interventions/notes)  Intervention: Promote Wound Healing  Recent Flowsheet Documentation  Taken 12/23/2023 1952 by Courtney-Dee K, RN  Oral Nutrition Promotion:   rest periods promoted   social interaction promoted  Pressure Reduction Techniques:   Frequent weight shifting encouraged   Heels elevated off of the bed  Pressure Reduction Devices: Repositioning wedges/pillows utilized  Sleep/Rest Enhancement:   awakenings minimized   consistent schedule promoted   family presence promoted   regular sleep/rest pattern promoted   room darkened  Activity Management:   ROM, active encouraged   up ad lib     Problem: Adult Inpatient Plan of Care  Goal: Absence of Hospital-Acquired Illness or Injury  Outcome: Ongoing (see interventions/notes)  Intervention: Identify and Manage Fall Risk  Recent Flowsheet Documentation  Taken 12/23/2023 2240 by Courtney-Dee K, RN  Safety Promotion/Fall Prevention:   activity supervised   fall prevention program maintained   safety round/check completed  Taken 12/23/2023 1952 by Courtney-Dee K, RN  Safety Promotion/Fall Prevention:   activity supervised   fall prevention program maintained   safety round/check completed  Intervention: Prevent Skin Injury  Recent Flowsheet Documentation  Taken 12/23/2023 2240 by Courtney-Dee K, RN  Body Position: sitting  Taken 12/23/2023 1952 by Courtney-Dee K, RN  Body Position: supine, head elevated  Skin Protection:   adhesive use limited   incontinence pads utilized   transparent dressing maintained   tubing/devices free from skin contact  Intervention: Prevent and Manage VTE (Venous Thromboembolism) Risk  Recent Flowsheet  Documentation  Taken 12/23/2023 1952 by Courtney-Dee K, RN  VTE Prevention/Management:   ambulation promoted   dorsiflexion/plantar flexion performed  Intervention: Prevent Infection  Recent Flowsheet Documentation  Taken 12/23/2023 2240 by Courtney-Dee K, RN  Infection Prevention: barrier precautions utilized  Taken 12/23/2023 1952 by Courtney-Dee K, RN  Infection Prevention:   barrier precautions utilized   equipment surfaces disinfected   glycemic control managed   personal protective equipment utilized   promote handwashing   rest/sleep promoted   single patient room provided  Goal: Optimal Comfort and Wellbeing  Outcome: Ongoing (see interventions/notes)  Intervention: Provide Person-Centered Care  Recent Flowsheet Documentation  Taken 12/23/2023 1952 by Margretta POUR, RN  Trust Relationship/Rapport:   care explained   choices provided   questions answered   questions encouraged   thoughts/feelings acknowledged  Goal: Rounds/Family Conference  Outcome: Ongoing (see interventions/notes)     Problem: Health Knowledge, Opportunity to Enhance (Adult,Obstetrics,Pediatric)  Goal: Knowledgeable about Health Subject/Topic  Description: Patient will demonstrate the desired outcomes by discharge/transition of care.  Outcome: Ongoing (see interventions/notes)  Intervention: Enhance Health Knowledge  Recent Flowsheet Documentation  Taken 12/23/2023 1952 by Courtney-Dee K, RN  Family/Support System Care:   involvement promoted   presence promoted   self-care encouraged   support provided  Supportive Measures:   active listening utilized   decision-making supported   self-care encouraged     Problem: Surgery Nonspecified  Goal: Absence of Bleeding  Outcome: Ongoing (see interventions/notes)  Intervention: Monitor and Manage Bleeding  Recent Flowsheet Documentation  Taken 12/23/2023 1952 by Courtney-Dee K, RN  Bleeding Management: dressing monitored  Goal: Effective Bowel Elimination  Outcome: Ongoing (see  interventions/notes)  Intervention: Enhance Bowel Motility and Elimination  Recent Flowsheet Documentation  Taken 12/23/2023 1952 by Courtney-Dee K, RN  Bowel Motility Enhancement:   ambulation promoted   fluid intake encouraged   oral intake encouraged  Goal: Fluid and Electrolyte Balance  Outcome: Ongoing (see interventions/notes)  Intervention: Monitor and Manage Fluid and Electrolyte Balance  Recent Flowsheet Documentation  Taken 12/23/2023 1952 by Courtney-Dee K, RN  Fluid/Electrolyte Management: fluids provided  Goal: Blood Glucose Level Within Target Range  Outcome: Ongoing (see interventions/notes)  Goal: Absence of Infection Signs and Symptoms  Outcome: Ongoing (see interventions/notes)  Intervention: Prevent or Manage Infection  Recent Flowsheet Documentation  Taken 12/23/2023 1952 by Courtney-Dee K, RN  Fever Reduction/Comfort Measures:   lightweight bedding   lightweight clothing  Infection Management: aseptic technique maintained  Goal: Anesthesia/Sedation Recovery  Outcome: Ongoing (see interventions/notes)  Intervention: Optimize Anesthesia Recovery  Recent Flowsheet Documentation  Taken 12/23/2023 2240 by Courtney-Dee K, RN  Safety Promotion/Fall Prevention:   activity supervised   fall prevention program maintained   safety round/check completed  Taken 12/23/2023 1952 by Courtney-Dee K, RN  Safety Promotion/Fall Prevention:   activity supervised   fall prevention program maintained   safety round/check completed  Reorientation Measures:   clock in view   familiar social contact encouraged  Stabilization Measures: legs elevated  Goal: Optimal Pain Control and Function  Outcome: Ongoing (see interventions/notes)  Intervention: Prevent or Manage Pain  Recent Flowsheet Documentation  Taken 12/23/2023 1952 by Courtney-Dee K, RN  Diversional Activities:   television   smartphone   individual hobbies  Goal: Nausea and Vomiting Relief  Outcome: Ongoing (see interventions/notes)  Goal: Effective Urinary  Elimination  Outcome: Ongoing (see interventions/notes)  Intervention: Monitor and Manage Urinary Retention  Recent  Flowsheet Documentation  Taken 12/23/2023 1952 by Courtney-Dee K, RN  Urinary Elimination Promotion:   absorbent pad/diaper use encouraged   toileting device within reach   frequent voiding encouraged  Goal: Effective Oxygenation and Ventilation  Outcome: Ongoing (see interventions/notes)  Intervention: Optimize Oxygenation and Ventilation  Recent Flowsheet Documentation  Taken 12/23/2023 2240 by Courtney-Dee K, RN  Head of Bed [facility]) Positioning: HOB elevated  Taken 12/23/2023 1952 by Courtney-Dee K, RN  Head of Bed (HOB) Positioning: HOB elevated     Problem: Comorbidity Management  Goal: Maintenance of Asthma Control  Outcome: Ongoing (see interventions/notes)  Intervention: Maintain Asthma Symptom Control  Recent Flowsheet Documentation  Taken 12/23/2023 2240 by Courtney-Dee K, RN  Medication Review/Management: medications reviewed  Taken 12/23/2023 1952 by Courtney-Dee K, RN  Medication Review/Management: medications reviewed  Goal: Maintenance of COPD Symptom Control  Outcome: Ongoing (see interventions/notes)  Intervention: Maintain COPD (Chronic Obstructive Pulmonary Disease) Symptom Control  Recent Flowsheet Documentation  Taken 12/23/2023 2240 by Courtney-Dee K, RN  Medication Review/Management: medications reviewed  Taken 12/23/2023 1952 by Courtney-Dee K, RN  Medication Review/Management: medications reviewed  Goal: Blood Glucose Level Within Target Range  Outcome: Ongoing (see interventions/notes)  Intervention: Monitor and Manage Glycemia  Recent Flowsheet Documentation  Taken 12/23/2023 2240 by Courtney-Dee K, RN  Medication Review/Management: medications reviewed  Taken 12/23/2023 1952 by Courtney-Dee K, RN  Medication Review/Management: medications reviewed  Goal: Blood Pressure in Desired Range  Outcome: Ongoing (see interventions/notes)  Intervention: Maintain Blood Pressure  Management  Recent Flowsheet Documentation  Taken 12/23/2023 2240 by Courtney-Dee K, RN  Medication Review/Management: medications reviewed  Taken 12/23/2023 1952 by Courtney-Dee K, RN  Medication Review/Management: medications reviewed     Problem: Skin Injury Risk Increased  Goal: Skin Health and Integrity  Outcome: Ongoing (see interventions/notes)  Intervention: Optimize Skin Protection  Recent Flowsheet Documentation  Taken 12/23/2023 2240 by Courtney-Dee K, RN  Head of Bed [facility]) Positioning: HOB elevated  Taken 12/23/2023 1952 by Courtney-Dee K, RN  Pressure Reduction Techniques:   Frequent weight shifting encouraged   Heels elevated off of the bed  Pressure Reduction Devices: Repositioning wedges/pillows utilized  Skin Protection:   adhesive use limited   incontinence pads utilized   transparent dressing maintained   tubing/devices free from skin contact  Activity Management:   ROM, active encouraged   up ad lib  Head of Bed (HOB) Positioning: HOB elevated  Intervention: Promote and Optimize Oral Intake  Recent Flowsheet Documentation  Taken 12/23/2023 1952 by Courtney-Dee K, RN  Oral Nutrition Promotion:   rest periods promoted   social interaction promoted       Courtney-Dee Angie Hogg, [facility]

## 2023-12-23 NOTE — Care Plan (Signed)
 Patient alert and oriented this shift. Complaints of pain to left knee and right foot voiced. PRN Oxy and Toradol  administered. Down for xray. Wound vac changed. Remains intact. Plan of care to continue.     BP (Non-Invasive): 126/72 Temperature: 36.8 C (98.2 F) Heart Rate: 89 Respiratory Rate: 18 SpO2: 98 %  Height: 165.1 cm (5' 5) Weight: (!) 138 kg (303 lb 2.1 oz) Body mass index is 50.44 kg/m.    I have reviewed this patient's orders and plan of care.  Currently this patient meets requirements for a mid to high of nursing care.  The patient will continue to be monitored and re-evaluated for any changes.     Cena Hush, RN

## 2023-12-23 NOTE — Progress Notes (Signed)
 [facility]  Medicine Progress Note  FULL CODE: ATTEMPT RESUSCITATION/CPR    Yvette Schmidt  Date of service: 12/23/2023    LOS: 4   Subjective:  Patient with complaints of left knee pain - xray of left knee pain ordered. She also mentioned ongoing right foot pain - pain regimen adjusted.     Filed Vitals:    12/22/23 2209 12/23/23 0030 12/23/23 0244 12/23/23 0653   BP:  129/77     Pulse:  91     Resp: 16 17 16 16    Temp:  36.6 C (97.9 F)     SpO2:  97%       Oxygen Device  SpO2: 97 %  Flow (L/min) (Oxygen Therapy): 6    I have reviewed the vitals.    Input/Output    Intake/Output Summary (Last 24 hours) at 12/23/2023 1135  Last data filed at 12/22/2023 2300  Gross per 24 hour   Intake 340 ml   Output 1000 ml   Net -660 ml       acetaminophen  (TYLENOL ) tablet, 650 mg, Oral, Q6H PRN  albuterol  (PROVENTIL ) 2.5 mg / 3 mL (0.083%) neb solution, 2.5 mg, Nebulization, Q6H PRN  budesonide  (PULMICORT  RESPULES) 0.5 mg/2 mL nebulizer suspension, 0.5 mg, Nebulization, 2x/day   And  arformoterol  (BROVANA ) 15 mcg/2 mL nebulizer solution, 15 mcg, Nebulization, 2x/day  aspirin  chewable tablet 81 mg, 81 mg, Oral, Daily  azelastine  (ASTELIN ) 0.1% nasal spray, 1 Spray, Each Nostril, 2x/day  cetirizine  (zyrTEC ) tablet, 10 mg, Oral, Daily  ciprofloxacin  (CIPRO ) tablet, 500 mg, Oral, 2x/day  clonazePAM  (klonoPIN ) tablet, 0.5 mg, Oral, 3x/day  Correction/SSIP insulin  lispro 100 units/mL injection, 4-18 Units, Subcutaneous, 4x/day AC  D5W 250 mL flush bag, , Intravenous, Q15 Min PRN  dextrose  (GLUTOSE) 40% oral gel, 15 g, Oral, Q15 Min PRN  dextrose  50% (0.5 g/mL) injection - syringe, 12.5 g, Intravenous, Q15 Min PRN  fluticasone  (FLONASE ) 50 mcg per spray nasal spray, 1 Spray, Each Nostril, 2x/day PRN  furosemide  (LASIX ) tablet, 40 mg, Oral, Daily  gabapentin  (NEURONTIN ) capsule, 400 mg, Oral, 2x/day  glucagon  (GLUCAGEN) injection 1 mg, 1 mg, IntraMUSCULAR, Once PRN  heparin  10,000 units/mL injection, 5,000 Units,  Subcutaneous, Q8HRS  insulin  glargine 100 units/mL injection, 50 Units, Subcutaneous, 2x/day  insulin  lispro 100 units/mL injection, 13 Units, Subcutaneous, 3x/day AC  ipratropium (ATROVENT ) 0.02% nebulizer solution, 0.25 mg, Nebulization, Q6H  lactulose  (ENULOSE ) 10g per 15mL oral liquid, 30 mL, Oral, Daily  linaclotide  (LINZESS ) capsule, 290 mcg, Oral, QAM  magnesium  oxide (MAG-OX) 400mg  (241.3 mg elemental magnesium ) tablet, 400 mg, Oral, Once  metroNIDAZOLE  (FLAGYL ) tablet, 500 mg, Oral, 2x/day  micafungin  (MYCAMINE ) 150 mg in NS 100 mL IVPB, 150 mg, Intravenous, Q24H  nicotine  (NICODERM CQ ) transdermal patch (mg/24 hr), 21 mg, Transdermal, Q24H  nicotine  polacrilex (COMMIT) lozenge, 4 mg, Oral, Q1H PRN  NS 250 mL flush bag, , Intravenous, Q15 Min PRN  NS flush syringe, 3 mL, Intracatheter, Q8HRS  NS flush syringe, 3 mL, Intracatheter, Q1H PRN  ondansetron  (ZOFRAN ) 2 mg/mL injection, 4 mg, Intravenous, Q8H PRN  oxyCODONE  (ROXICODONE ) immediate release tablet, 10 mg, Oral, Q4H PRN  pantoprazole  (PROTONIX ) 4 mg/mL injection, 40 mg, Intravenous, 2x/day  phenol (CHLORASEPTIC) 1.4% oromucosal spray, 1 Spray, Mouth/Throat, Q4H PRN  rosuvastatin  (CRESTOR ) tablet, 20 mg, Oral, QPM  sennosides-docusate sodium  (SENOKOT-S) 8.6-50mg  per tablet, 1 Tablet, Oral, HS PRN  sucralfate  (CARAFATE ) tablet, 1 g, Oral, Q6HRS  SUMAtriptan  (IMITREX ) tablet, 25 mg, Oral, Q24 H PRN  Physical Exam:  General: No acute distress   Cardiac:  Regular rate  Respiratory:  No increased work of breathing  Abdomen: Positive bowel sounds, soft, nontender  Extremities:  Right lower extremity wound VAC in place.    CBC (Last 24 Hours):    Recent Results last 24 hours     12/23/23  0701   WBC 10.6   HGB 15.6   HCT 48.0*   MCV 93.9   PLTCNT 201         BMP (Last 24 Hours):    Recent Results last 24 hours     12/23/23  0701   SODIUM 132*   POTASSIUM 4.0   CHLORIDE 99   CO2 22   BUN 16   CREATININE 0.69   CALCIUM 9.1         I have reviewed all  labs.    Micro:   Hospital Encounter on 12/19/23 (from the past 96 hours)   TISSUE CULTURE (AEROBIC CULT & GRAM STAIN)    Collection Time: 12/20/23 10:31 AM    Specimen: Foot; Tissue   Culture Result Status    GRAM STAIN No WBCs (A) Preliminary    GRAM STAIN 1+ Rare Yeast (A) Preliminary       Radiology:  Results for orders placed or performed during the hospital encounter of 12/19/23 (from the past 72 hours)   XR KUB     Status: None    Narrative    MRS. Yvette Schmidt  Female, 48 years old.    XR KUB performed on 12/22/2023 1:33 PM.    REASON FOR EXAM:  recurrent n/v; chronic constipation; acute opioid use    TECHNIQUE: 1 views/1 images submitted for interpretation.    COMPARISON:  12/14/2005.      Impression    FINDINGS/IMPRESSION:  No evidence for obstruction or ileus. Minimal stool retention. No renal calculi identified radiographically.            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 Rod on 11/26.  -the patient received Dalvance  on 11/25 no need to cover Gram-positive coverage at this time  -Tissue culture from 12/05/23 with growth of candida glabrata. OR cultures from 11/26 with rare yeast, pending speciation.  Plan:  -- vascular consulted, status post OR, wound VAC now in place.  -- Infectious disease consulted, following.  -- Continue oral antibiotics with ciprofloxacin  and Flagyl , Dalvance  outpatient  -- Initiated on IV Micafungin  for yeast osteomyelitis.   -- Pain management.     History of cardiac diastolic dysfunction  Questionable CAD  -monitor IO as patient appears euvolemic  -continue aspirin   -continue Lasix      Uncontrolled type 2 diabetes  mellitus complicated by peripheral neuropathy  -sugars on admission over 400  -glargine increased to 80  -added lispro t.i.d. 10 units  -SSI aggressive  -adjust insulin  regimen as necessary   -continue gabapentin  b.i.d.     Chronic Obstructive Pulmonary Disease  -continue Pulmicort  and Brovana      Hypertension  -continue Lasix    -may benefit from Ace Arb given diabetic but blood pressure is within normal  limits, may benefit from low-dose on discharge     Anxiety  -Klonopin       Tobacco use disorder   -nicotine  patch p.r.n.   -nicotine  lozenge     History of constipation   -continue Linzess , lactulose      gastroesophageal reflux disease   -Protonix             ___    DVT/PE Prophylaxis - Heparin   Consults - ID and vascular surgery  Diet - regular, low calorie count  Disposition Planning - Home discharge     Yvette Durfey Janita Bastos, MD    This note may have been partially generated using MModal Fluency Direct system, and there may be some incorrect words, spellings, and punctuation that were not noted in checking the note before saving.

## 2023-12-23 NOTE — Progress Notes (Signed)
 [facility] [place], [place] [address]      Date:   12/23/2023  Name: Yvette Schmidt  Age: 48 y.o.  Length of stay: 4    Requesting Physician: Yvette Schmidt*  Primary Care Provider: Ripley Carmell Puller, MD    Chief Complaint:  Right foot infection  Reason for the consult: Tita Cram, MD consulted for an opinion and treatment recommendations regarding Yvette Schmidt with positive occult emesis    Interval history:  Patient denies any recurrent nausea and vomiting.  Does endorse a sore throat with mild dysphagia today in setting of nausea and vomiting.  Endorsing bowel movements.  Tolerating diet well.    Assessment and Plan:  Yvette Schmidt is a 48 y.o. female who presents with No chief complaint on file..  Past medical history is significant for asthma, type 2 diabetes, depression, GERD, anxiety, osteomyelitis.  Gastroenterology team is consulted for the following issues:     Positive occult emesis in setting of recurrent nausea and vomiting. Differential diagnosis includes esophagitis, gastritis, peptic ulcer disease.  Hemoglobin stable, unchanged since admission.  Hemodynamically stable.  Recurrent nausea and vomiting, most likely multifactorial etiology as below.  Acute NSAID use.  Daily NSAID use, Mobic .  Currently on PPI b.i.d. as outpatient.  Chronic constipation.  CT AP 12/07/2023 revealed diffuse colonic stool retention.  Uncontrolled type 2 diabetes.  Last hemoglobin A1c 11.6.  Question underlying component of gastroparesis.  Morbid obesity.  Hepatic steatosis.  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  Currently on aggressive antibiotic regimen.    My recommendations are as follows:    Okay from GI standpoint for diet as tolerated; consider trial of gastroparesis diet with any recurrent/persistent nausea and vomiting  Continue PPI b.i.d.  Carafate  1 g p.o. q.i.d. slurry x 14 days  Antibiotics per primary service  continue Linzess  and lactulose   KUB revealed minimal stool burden no evidence of ileus/obstruction  Continue p.r.n. antiemetics  Limit narcotic use as feasible  No plans for endoscopy at this juncture unless indicated by recurrent GI symptoms despite aggressive medical management or worsening anemia with evidence of overt GI bleeding at this time; can consider EGD prior to discharge based on clinical course  Consider outpatient gastric emptying study if endoscopy reveals no concerns and symptoms persist; will discuss with GI follow-up appointment  Management of comorbidities per primary team.    Thank you for consulting us . Please call for any additional questions or acute changes. We will follow.      Attending attestation:  Plan for EGD based on clinical course    I have  seen and examined the patient.  I reviewed key elements of the HPI with Josette Spell, FNP-BC.  The blood work up, imaging reports, assessment, plan of care, and recommendations were discussed with Josette Spell, FNP-BC. I agree with the above assessment and plan with additions as mentioned above.     Tyrone Pautsch Prasad Hennesy Sobalvarro M.D.  Gastroenterologist   [facility] - [facility]  [place], [place]  [id number]'    _________________________________________________________    History of Present Illness  [name] is a pleasent 48 y.o. female with past medical history as mentioned below. Patient was admitted for infection. The history was obtained from the patient and chart review.  HPI and admitting physician H&P were reviewed.    Patient presented to the emergency room after being seen by outpatient vascular surgery with concerns for worsening right foot  infection.  Patient underwent debridement and washout of right foot osteomyelitis complicated by abscess on 12/07/2023.  Currently remains on aggressive antibiotic therapy.  GI consult placed for recurrent nausea and vomiting with episode of coffee-ground emesis overnight.  Occult  study ordered by primary service which was positive.  Hemoglobin remains stable, relatively unchanged since admission.  Hemodynamically stable.  Not currently on antiplatelets/anticoagulation therapy.  Is currently on DVT prophylaxis with subQ heparin .  No prior endoscopies with review of chart.  Per review of medication list, patient is currently taking daily Mobic  and PRN NSAIDs.  Currently on Protonix  b.i.d. p.o..      Review of Systems  See HPI.     Past Medical History: She  has a past medical history of Asthma, COPD (chronic obstructive pulmonary disease), Diabetes mellitus, type 2, Gastroparesis, GERD (gastroesophageal reflux disease), Hypertension, and MRSA (methicillin resistant staph aureus) culture positive (10/10/2022).      Past Surgical History: She  has a past surgical history that includes pb colonoscopy,diagnostic; hx lap cholecystectomy (05/12/2017); hx hysterectomy (2012); and Foot Amputation.    Social History: Her  reports that she has been smoking cigarettes. She has a 7.5 pack-year smoking history. She has never used smokeless tobacco. She reports that she does not currently use alcohol. She reports that she does not currently use drugs after having used the following drugs: Marijuana., ,     Family History: She family history is not on file.    Allergies: She is allergic to beeswax, other, splenda [sucralose], sulfa (sulfonamides), theophylline, and hydrocodone.    Home Medication:   Medications Prior to Admission       Prescriptions    ACCU-CHEK GUIDE GLUCOSE METER Does not apply Misc    Use three times daily with lancets and test strips    albuterol  sulfate (PROVENTIL ) 2.5 mg /3 mL (0.083 %) Inhalation nebulizer solution    Inhale 3 mL (2.5 mg total) Via Nebulizer Every 6 hours as needed for Wheezing    albuterol  sulfate (VENTOLIN  HFA) 90 mcg/actuation Inhalation oral inhaler    INHALE 1-2 PUFFS BY INHALATION EVERY 6 HOURS AS NEEDED    amitriptyline  (ELAVIL ) 50 mg Oral Tablet    Take 1  Tablet (50 mg total) by mouth Every night for 90 days    aspirin  81 mg Oral Tablet, Chewable    Chew 1 Tablet (81 mg total) Daily    azelastine  (ASTELIN ) 137 mcg (0.1 %) Nasal Spray, Non-Aerosol    ADMINISTER 1 SPRAY INTO EACH NOSTRIL TWICE DAILY FOR 30 DAYS    betamethasone  valerate (VALISONE ) 0.1 % Ointment    Apply topically Twice daily    Blood Sugar Diagnostic (ACCU-CHEK GUIDE TEST STRIPS) Does not apply Strip    USE 1 STRIP TO CHECK BLOOD SUGAR FOUR TIMES DAILY BEFORE MEALS AND AT BEDTIME    cetirizine  (ZYRTEC ) 10 mg Oral Tablet    Take 1 Tablet (10 mg total) by mouth Daily for 90 days    clonazePAM  (KLONOPIN ) 0.5 mg Oral Tablet    Take 1 Tablet (0.5 mg total) by mouth Twice daily    EPINEPHrine  0.3 mg/0.3 mL Injection Auto-Injector    Inject 0.3 mL (0.3 mg total) into the muscle Once, as needed for up to 2 doses    fluticasone  propionate (FLONASE ) 50 mcg/actuation Nasal Spray, Suspension    Administer 2 Sprays into each nostril Twice daily    furosemide  (LASIX ) 40 mg Oral Tablet    Take 1 Tablet (40 mg total)  by mouth Once a day    gabapentin  (NEURONTIN ) 800 mg Oral Tablet    Take 1/2 tablet by mouth in the morning and afternoon, then take 1 tablet in the evening.    Ibuprofen  (MOTRIN ) 200 mg Oral Tablet    Take 4 Tablets (800 mg total) by mouth Four times a day as needed for Pain    insulin  aspart U-100 (NOVOLOG ) 100 unit/mL (3 mL) Subcutaneous Insulin  Pen    Inject under the skin Twice a day before meals per Sliding Scale. <150 No injection, 150-200 2 units, 201-250 4 units, 251-300 6 units, 301-350 9 units, 351-400 12 units    Insulin  Needles, Disposable, (SURE COMFORT PEN NEEDLE) 32 gauge x 1/4 Needle    USE TO INJECT INSULIN  TWICE DAILY    ipratropium (ATROVENT ) 0.02 % Inhalation Solution    Take 1.25 mL (0.25 mg total) by nebulization Every 6 hours    lactulose  (ENULOSE ) 10 gram/15 mL Oral Solution    Take 30 mL by mouth Daily for 30 days    Patient not taking:  Reported on 12/19/2023    Lancets  (ACCU-CHEK SOFTCLIX LANCETS) Misc    Use to Check blood Sugar Three Times Daily    linaCLOtide  (LINZESS ) 290 mcg Oral Capsule    Take 1 Capsule (290 mcg total) by mouth Every morning    Patient taking differently:  Take 1 Capsule (290 mcg total) by mouth Every other day    meloxicam  (MOBIC ) 15 mg Oral Tablet    TAKE 1 TABLET BY MOUTH EVERY DAY AT BEDTIME    mineral oil-hydrophil petrolat (AQUAPHOR) Ointment    Apply topically Four times a day as needed    mupirocin  (BACTROBAN ) 2 % Ointment    Apply topically Three times a day for 30 days    Patient not taking:  Reported on 12/19/2023    Nebulizers Misc    Use with Nebulizer medication    oxyCODONE  (ROXICODONE ) 5 mg Oral Tablet    Take 2 Tablets (10 mg total) by mouth Every 4 hours as needed for up to 3 days    pantoprazole  (PROTONIX ) 40 mg Oral Tablet, Delayed Release (E.C.)    Take 1 Tablet (40 mg total) by mouth Twice daily    polyethylene glycol (MIRALAX ) 17 gram Oral Powder in Packet    Take 1 Packet (17 g total) by mouth Once per day as needed for up to 30 days    rosuvastatin  (CRESTOR ) 20 mg Oral Tablet    Take 1 Tablet (20 mg total) by mouth Every evening    saccharomyces boulardii (FLORASTOR) capsule    sennosides-docusate sodium  (SENOKOT-S) 8.6-50 mg Oral Tablet    Take 1 Tablet by mouth Twice daily for 30 days    simethicone  (MYLICON) 80 mg Oral Tablet, Chewable    Chew 1 Tablet (80 mg total) Every 6 hours as needed for up to 30 days    Patient not taking:  Reported on 12/19/2023    SITagliptin  phosphate (JANUVIA ) 100 mg Oral Tablet    Take 1 Tablet (100 mg total) by mouth Once a day    SUMAtriptan  (IMITREX ) 50 mg Oral Tablet    TAKE 1 TABLET BY MOUTH ONCE DAILY AS NEEDED FOR MIGRAINES MAY REPEAT IN 2 HOURS IF NEEDED FOR 30 DAYS DO NOT EXCEED MORE THAN 9 DOSES IN A 30 DAY PERIOD    SYMBICORT  160-4.5 mcg/actuation Inhalation oral inhaler    TAKE 2 PUFFS BY INHALATION TWICE DAILY  vitamin B complex  Oral Tablet    Take 1 Tablet by mouth Daily             Current Medications:   Current Facility-Administered Medications   Medication Dose Route Frequency    acetaminophen  (TYLENOL ) tablet  650 mg Oral Q6H PRN    albuterol  (PROVENTIL ) 2.5 mg / 3 mL (0.083%) neb solution  2.5 mg Nebulization Q6H PRN    budesonide  (PULMICORT  RESPULES) 0.5 mg/2 mL nebulizer suspension  0.5 mg Nebulization 2x/day    And    arformoterol  (BROVANA ) 15 mcg/2 mL nebulizer solution  15 mcg Nebulization 2x/day    aspirin  chewable tablet 81 mg  81 mg Oral Daily    ciprofloxacin  (CIPRO ) tablet  500 mg Oral 2x/day    clonazePAM  (klonoPIN ) tablet  0.5 mg Oral 3x/day    Correction/SSIP insulin  lispro 100 units/mL injection  4-18 Units Subcutaneous 4x/day AC    D5W 250 mL flush bag   Intravenous Q15 Min PRN    dextrose  (GLUTOSE) 40% oral gel  15 g Oral Q15 Min PRN    dextrose  50% (0.5 g/mL) injection - syringe  12.5 g Intravenous Q15 Min PRN    fluticasone  (FLONASE ) 50 mcg per spray nasal spray  1 Spray Each Nostril 2x/day PRN    furosemide  (LASIX ) tablet  40 mg Oral Daily    gabapentin  (NEURONTIN ) capsule  400 mg Oral 2x/day    glucagon  (GLUCAGEN) injection 1 mg  1 mg IntraMUSCULAR Once PRN    heparin  10,000 units/mL injection  5,000 Units Subcutaneous Q8HRS    insulin  glargine 100 units/mL injection  50 Units Subcutaneous 2x/day    insulin  lispro 100 units/mL injection  13 Units Subcutaneous 3x/day AC    ipratropium (ATROVENT ) 0.02% nebulizer solution  0.25 mg Nebulization Q6H    lactulose  (ENULOSE ) 10g per 15mL oral liquid  30 mL Oral Daily    linaclotide  (LINZESS ) capsule  290 mcg Oral QAM    magnesium  oxide (MAG-OX) 400mg  (241.3 mg elemental magnesium ) tablet  400 mg Oral Once    metroNIDAZOLE  (FLAGYL ) tablet  500 mg Oral 2x/day    micafungin  (MYCAMINE ) 150 mg in NS 100 mL IVPB  150 mg Intravenous Q24H    nicotine  (NICODERM CQ ) transdermal patch (mg/24 hr)  14 mg Transdermal Q24H    nicotine  polacrilex (COMMIT) lozenge  4 mg Oral Q1H PRN    NS 250 mL flush bag   Intravenous Q15 Min PRN    NS  flush syringe  3 mL Intracatheter Q8HRS    NS flush syringe  3 mL Intracatheter Q1H PRN    ondansetron  (ZOFRAN ) 2 mg/mL injection  4 mg Intravenous Q8H PRN    oxyCODONE  (ROXICODONE ) immediate release tablet  5 mg Oral Q4H PRN    pantoprazole  (PROTONIX ) 4 mg/mL injection  40 mg Intravenous 2x/day    phenol (CHLORASEPTIC) 1.4% oromucosal spray  1 Spray Mouth/Throat Q4H PRN    rosuvastatin  (CRESTOR ) tablet  20 mg Oral QPM    sennosides-docusate sodium  (SENOKOT-S) 8.6-50mg  per tablet  1 Tablet Oral HS PRN    sucralfate  (CARAFATE ) tablet  1 g Oral Q6HRS    SUMAtriptan  (IMITREX ) tablet  25 mg Oral Q24 H PRN        Examination:  BP 129/77   Pulse 91   Temp 36.6 C (97.9 F)   Resp 16   Ht 1.651 m (5' 5)   Wt (!) 138 kg (303 lb 2.1 oz)   SpO2 97%   BMI 50.44  kg/m       General: Resting comfortably, no acute distress; obese  Eyes:  Conjunctiva normal, sclera non-icteric.  Abdomen:  Soft, non-tender, non-distended  Skin:  Skin warm and dry. No jaundice  Neurologic:  Alert, Oriented, Grossly normal  Psychiatric:  Appears normal    Data/Chart reviewed:    Previous labs reviewed?  yes     CBC:  Recent Labs     12/21/23  0713 12/22/23  0741 12/23/23  0701   WBC 10.3 10.1 10.6   HGB 14.7 14.8 15.6   HCT 44.3 45.4 48.0*   PLTCNT 216 208 201       BMP:  Recent Labs     12/21/23  0713 12/22/23  0741 12/23/23  0701   SODIUM 133* 135* 132*   POTASSIUM 4.1 3.8 4.0   CHLORIDE 99 98 99   CO2 24 27 22    BUN 11 15 16    CREATININE 0.65 0.67 0.69   GFR >90 >90 >90   CALCIUM 8.6 8.7 9.1       Coagulation Studies:  No results for input(s): INR, PROTHROMTME, APTT in the last 72 hours.    Invalid input(s): PTT, PT, CREACTPROTIE        Previous biopsy and pathology/EKG/outside records?  yes     Previous imaging reviewed?  yes     CT chest AP 12/07/2023:  1. Suspect some areas of atelectasis in the lungs. Otherwise no definite acute cardiopulmonary abnormality.  2. Hepatomegaly and fatty infiltration of the liver.  3. Diffuse  colonic stool retention.  4. Fat-containing umbilical hernia.  _________________________________________________________  Note: A portion of this documentation was generated using MMODAL (voice recognition software) and may contain syntax/voice recognition errors. The above data may have been collected via chart review, discussion with other providers, review of medical records, discussion with the patients family/friends, and or obtained by the patient.  Not all of the above information was necessarily discussed with the patient.  The above stated documentation is for the medical record and communication/coordination of care for the patients best interest.  If the patient/family has any questions about the above stated material, this can be answered at the next scheduled visit or during hospital rounds if the patient is hospitalized.

## 2023-12-23 NOTE — Care Plan (Signed)
 Problem: Wound  Goal: Optimal Coping  Outcome: Ongoing (see interventions/notes)  Goal: Optimal Functional Ability  Outcome: Ongoing (see interventions/notes)  Goal: Absence of Infection Signs and Symptoms  Outcome: Ongoing (see interventions/notes)  Goal: Improved Oral Intake  Outcome: Ongoing (see interventions/notes)  Goal: Optimal Pain Control and Function  Outcome: Ongoing (see interventions/notes)  Goal: Skin Health and Integrity  Outcome: Ongoing (see interventions/notes)  Goal: Optimal Wound Healing  Outcome: Ongoing (see interventions/notes)     Problem: Adult Inpatient Plan of Care  Goal: Absence of Hospital-Acquired Illness or Injury  Outcome: Ongoing (see interventions/notes)  Goal: Optimal Comfort and Wellbeing  Outcome: Ongoing (see interventions/notes)  Goal: Rounds/Family Conference  Outcome: Ongoing (see interventions/notes)     Problem: Health Knowledge, Opportunity to Enhance (Adult,Obstetrics,Pediatric)  Goal: Knowledgeable about Health Subject/Topic  Description: Patient will demonstrate the desired outcomes by discharge/transition of care.  Outcome: Ongoing (see interventions/notes)     Problem: Surgery Nonspecified  Goal: Absence of Bleeding  Outcome: Ongoing (see interventions/notes)  Goal: Effective Bowel Elimination  Outcome: Ongoing (see interventions/notes)  Goal: Fluid and Electrolyte Balance  Outcome: Ongoing (see interventions/notes)  Goal: Blood Glucose Level Within Target Range  Outcome: Ongoing (see interventions/notes)  Goal: Absence of Infection Signs and Symptoms  Outcome: Ongoing (see interventions/notes)  Goal: Anesthesia/Sedation Recovery  Outcome: Ongoing (see interventions/notes)  Goal: Optimal Pain Control and Function  Outcome: Ongoing (see interventions/notes)  Goal: Nausea and Vomiting Relief  Outcome: Ongoing (see interventions/notes)  Goal: Effective Urinary Elimination  Outcome: Ongoing (see interventions/notes)  Goal: Effective Oxygenation and Ventilation  Outcome:  Ongoing (see interventions/notes)

## 2023-12-24 DIAGNOSIS — Z791 Long term (current) use of non-steroidal anti-inflammatories (NSAID): Secondary | ICD-10-CM

## 2023-12-24 LAB — BASIC METABOLIC PANEL
ANION GAP: 9 mmol/L (ref 4–13)
BUN/CREA RATIO: 30 — ABNORMAL HIGH (ref 6–22)
BUN: 21 mg/dL (ref 8–25)
CALCIUM: 8.9 mg/dL (ref 8.6–10.2)
CHLORIDE: 98 mmol/L (ref 96–111)
CO2 TOTAL: 25 mmol/L (ref 22–30)
CREATININE: 0.71 mg/dL (ref 0.60–1.05)
GLUCOSE: 337 mg/dL — ABNORMAL HIGH (ref 65–125)
POTASSIUM: 4.1 mmol/L (ref 3.5–5.1)
SODIUM: 132 mmol/L — ABNORMAL LOW (ref 136–145)
eGFRcr - FEMALE: >90 mL/min/1.73mˆ2 (ref >=60)

## 2023-12-24 LAB — MAGNESIUM: MAGNESIUM: 1.9 mg/dL (ref 1.8–2.6)

## 2023-12-24 LAB — CBC WITH DIFF
BASOPHIL #: <0.1 x10ˆ3/uL (ref <=0.20)
BASOPHIL %: 0.6 %
EOSINOPHIL #: 0.54 x10ˆ3/uL — ABNORMAL HIGH (ref <=0.50)
EOSINOPHIL %: 6 %
HCT: 45.9 % (ref 34.8–46.0)
HGB: 15 g/dL (ref 11.5–16.0)
IMMATURE GRANULOCYTE #: <0.1 x10ˆ3/uL (ref <0.10)
IMMATURE GRANULOCYTE %: 0.3 % (ref 0.0–1.0)
LYMPHOCYTE #: 2.69 x10ˆ3/uL (ref 1.00–4.80)
LYMPHOCYTE %: 29.7 %
MCH: 30.5 pg (ref 26.0–32.0)
MCHC: 32.7 g/dL (ref 31.0–35.5)
MCV: 93.3 fL (ref 78.0–100.0)
MONOCYTE #: 0.61 x10ˆ3/uL (ref 0.20–1.10)
MONOCYTE %: 6.7 %
MPV: 11.1 fL (ref 8.7–12.5)
NEUTROPHIL #: 5.14 x10ˆ3/uL (ref 1.50–7.70)
NEUTROPHIL %: 56.7 %
PLATELETS: 190 x10ˆ3/uL (ref 150–400)
RBC: 4.92 x10ˆ6/uL (ref 3.85–5.22)
RDW-CV: 14.7 % (ref 11.5–15.5)
WBC: 9.1 x10ˆ3/uL (ref 3.7–11.0)

## 2023-12-24 LAB — POC BLOOD GLUCOSE (RESULTS)
GLUCOSE, POC: 389 mg/dL — ABNORMAL HIGH (ref 70–110)
GLUCOSE, POC: 419 mg/dL — ABNORMAL HIGH (ref 70–110)
GLUCOSE, POC: 326 mg/dL — ABNORMAL HIGH (ref 70–110)
GLUCOSE, POC: 428 mg/dL — ABNORMAL HIGH (ref 70–110)

## 2023-12-24 LAB — ANAEROBIC CULTURE

## 2023-12-24 LAB — PHOSPHORUS: PHOSPHORUS: 4 mg/dL (ref 2.4–4.7)

## 2023-12-24 MED ORDER — SUMATRIPTAN 25 MG TABLET
50.0000 mg | ORAL_TABLET | Freq: Once | ORAL | Status: AC
  Administered 2023-12-24: 50 mg via ORAL
  Filled 2023-12-24: qty 2

## 2023-12-24 MED ORDER — INSULIN GLARGINE 100 UNITS/ML SUBQ - CHARGE BY DOSE
65.0000 [IU] | Freq: Two times a day (BID) | SUBCUTANEOUS | Status: DC
  Administered 2023-12-24 (×2): 65 [IU] via SUBCUTANEOUS
  Filled 2023-12-24: qty 65

## 2023-12-24 MED ORDER — INSULIN GLARGINE 100 UNITS/ML SUBQ - CHARGE BY DOSE: 75.0000 [IU] | Freq: Two times a day (BID) | SUBCUTANEOUS | Status: DC

## 2023-12-24 NOTE — Nurses Notes (Signed)
 Currently this patient meets requirements for a low to mid level of nursing care.  The patient will continue to be monitored and re-evaluated for any changes.  Thayer Headings, RN

## 2023-12-24 NOTE — Progress Notes (Signed)
 Gastroenterology Service [place], [place] [address]      Date:   12/24/2023  Name: Yvette Schmidt  Age: 48 y.o.  Length of stay: 5    Requesting Physician: Janita Hilarie Duos*  Primary Care Provider: Ripley Carmell Puller, MD    Chief Complaint:  Right foot infection  Reason for the consult: Tita Cram, MD consulted for an opinion and treatment recommendations regarding Yvette Schmidt with positive occult emesis    Interval history:  Patient denies any recurrent nausea and vomiting.  Does endorse a sore throat with mild dysphagia today in setting of recent nausea and vomiting.  Refused Carafate  slurry Endorsing bowel movements.  Tolerating diet well.  Patient asking to go home.    Assessment and Plan:  [name] is a 48 y.o. female who presents with No chief complaint on file..  Past medical history is significant for asthma, type 2 diabetes, depression, GERD, anxiety, osteomyelitis.  Gastroenterology team is consulted for the following issues:     Positive occult emesis in setting of recurrent nausea and vomiting. Differential diagnosis includes esophagitis, gastritis, peptic ulcer disease.  Hemoglobin stable, unchanged since admission.  Hemodynamically stable.  Recurrent nausea and vomiting, most likely multifactorial etiology as below.  Resolved.  Acute NSAID use.  Daily NSAID use, Mobic .  Currently on PPI b.i.d. as outpatient.  Chronic constipation.  CT AP 12/07/2023 revealed diffuse colonic stool retention.  Uncontrolled type 2 diabetes.  Last hemoglobin A1c 11.6.  Question underlying component of gastroparesis.  Morbid obesity.  Hepatic steatosis.  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  Currently on aggressive antibiotic regimen.    My recommendations are as follows:    Okay from GI standpoint for diet as tolerated; consider trial of gastroparesis diet  Continue PPI p.o. b.i.d.  x 8 weeks then transition to daily  Carafate  1 g p.o.  q.i.d. slurry x 14 days  Antibiotics per primary service continue Linzess  and lactulose   KUB revealed minimal stool burden no evidence of ileus/obstruction  Recommend MiraLax  daily on discharge  Continue p.r.n. antiemetics  Limit narcotic use as feasible  No plans for endoscopy at this juncture; consider outpatient EGD if no improvement in symptoms with medical management following resolution of acute clinical illness.  Consider outpatient gastric emptying study if endoscopy reveals no concerns and symptoms persist; will discuss with GI follow-up appointment  GI follow-up appointment made; GI service will sign off at this time.  Please call/reconsult with any GI issues or concerns.  Management of comorbidities per primary team.    Thank you for consulting us . Please call for any additional questions or acute changes. We will follow.      Attending attestation:    I have  seen and examined the patient.  I reviewed key elements of the HPI with Josette Spell, FNP-BC.  The blood work up, imaging reports, assessment, plan of care, and recommendations were discussed with Josette Spell, FNP-BC. I agree with the above assessment and plan with additions as mentioned above.     Sukhman Kocher Prasad Laquan Ludden, M.D  [facility] - Gastroenterology  [place], [place] [address]        _________________________________________________________    History of Present Illness  [name] is a pleasent 49 y.o. female with past medical history as mentioned below. Patient was admitted for infection. The history was obtained from the patient and chart review.  HPI and admitting physician H&P were reviewed.  Patient presented to the emergency room after being seen by outpatient vascular surgery with concerns for worsening right foot infection.  Patient underwent debridement and washout of right foot osteomyelitis complicated by abscess on 12/07/2023.  Currently remains on aggressive antibiotic therapy.  GI consult placed for  recurrent nausea and vomiting with episode of coffee-ground emesis overnight.  Occult study ordered by primary service which was positive.  Hemoglobin remains stable, relatively unchanged since admission.  Hemodynamically stable.  Not currently on antiplatelets/anticoagulation therapy.  Is currently on DVT prophylaxis with subQ heparin .  No prior endoscopies with review of chart.  Per review of medication list, patient is currently taking daily Mobic  and PRN NSAIDs.  Currently on Protonix  b.i.d. p.o..      Review of Systems  See HPI.     Past Medical History: She  has a past medical history of Asthma, COPD (chronic obstructive pulmonary disease), Diabetes mellitus, type 2, Gastroparesis, GERD (gastroesophageal reflux disease), Hypertension, and MRSA (methicillin resistant staph aureus) culture positive (10/10/2022).      Past Surgical History: She  has a past surgical history that includes pb colonoscopy,diagnostic; hx lap cholecystectomy (05/12/2017); hx hysterectomy (2012); and Foot Amputation.    Social History: Her  reports that she has been smoking cigarettes. She has a 7.5 pack-year smoking history. She has never used smokeless tobacco. She reports that she does not currently use alcohol. She reports that she does not currently use drugs after having used the following drugs: Marijuana., ,     Family History: She family history is not on file.    Allergies: She is allergic to beeswax, other, splenda [sucralose], sulfa (sulfonamides), theophylline, and hydrocodone.    Home Medication:   Medications Prior to Admission       Prescriptions    ACCU-CHEK GUIDE GLUCOSE METER Does not apply Misc    Use three times daily with lancets and test strips    albuterol  sulfate (PROVENTIL ) 2.5 mg /3 mL (0.083 %) Inhalation nebulizer solution    Inhale 3 mL (2.5 mg total) Via Nebulizer Every 6 hours as needed for Wheezing    albuterol  sulfate (VENTOLIN  HFA) 90 mcg/actuation Inhalation oral inhaler    INHALE 1-2 PUFFS BY  INHALATION EVERY 6 HOURS AS NEEDED    amitriptyline  (ELAVIL ) 50 mg Oral Tablet    Take 1 Tablet (50 mg total) by mouth Every night for 90 days    aspirin  81 mg Oral Tablet, Chewable    Chew 1 Tablet (81 mg total) Daily    azelastine  (ASTELIN ) 137 mcg (0.1 %) Nasal Spray, Non-Aerosol    ADMINISTER 1 SPRAY INTO EACH NOSTRIL TWICE DAILY FOR 30 DAYS    betamethasone  valerate (VALISONE ) 0.1 % Ointment    Apply topically Twice daily    Blood Sugar Diagnostic (ACCU-CHEK GUIDE TEST STRIPS) Does not apply Strip    USE 1 STRIP TO CHECK BLOOD SUGAR FOUR TIMES DAILY BEFORE MEALS AND AT BEDTIME    cetirizine  (ZYRTEC ) 10 mg Oral Tablet    Take 1 Tablet (10 mg total) by mouth Daily for 90 days    clonazePAM  (KLONOPIN ) 0.5 mg Oral Tablet    Take 1 Tablet (0.5 mg total) by mouth Twice daily    EPINEPHrine  0.3 mg/0.3 mL Injection Auto-Injector    Inject 0.3 mL (0.3 mg total) into the muscle Once, as needed for up to 2 doses    fluticasone  propionate (FLONASE ) 50 mcg/actuation Nasal Spray, Suspension    Administer 2 Sprays into each nostril Twice  daily    furosemide  (LASIX ) 40 mg Oral Tablet    Take 1 Tablet (40 mg total) by mouth Once a day    gabapentin  (NEURONTIN ) 800 mg Oral Tablet    Take 1/2 tablet by mouth in the morning and afternoon, then take 1 tablet in the evening.    Ibuprofen  (MOTRIN ) 200 mg Oral Tablet    Take 4 Tablets (800 mg total) by mouth Four times a day as needed for Pain    insulin  aspart U-100 (NOVOLOG ) 100 unit/mL (3 mL) Subcutaneous Insulin  Pen    Inject under the skin Twice a day before meals per Sliding Scale. <150 No injection, 150-200 2 units, 201-250 4 units, 251-300 6 units, 301-350 9 units, 351-400 12 units    Insulin  Needles, Disposable, (SURE COMFORT PEN NEEDLE) 32 gauge x 1/4 Needle    USE TO INJECT INSULIN  TWICE DAILY    ipratropium (ATROVENT ) 0.02 % Inhalation Solution    Take 1.25 mL (0.25 mg total) by nebulization Every 6 hours    lactulose  (ENULOSE ) 10 gram/15 mL Oral Solution    Take 30 mL by  mouth Daily for 30 days    Patient not taking:  Reported on 12/19/2023    Lancets (ACCU-CHEK SOFTCLIX LANCETS) Misc    Use to Check blood Sugar Three Times Daily    linaCLOtide  (LINZESS ) 290 mcg Oral Capsule    Take 1 Capsule (290 mcg total) by mouth Every morning    Patient taking differently:  Take 1 Capsule (290 mcg total) by mouth Every other day    meloxicam  (MOBIC ) 15 mg Oral Tablet    TAKE 1 TABLET BY MOUTH EVERY DAY AT BEDTIME    mineral oil-hydrophil petrolat (AQUAPHOR) Ointment    Apply topically Four times a day as needed    mupirocin  (BACTROBAN ) 2 % Ointment    Apply topically Three times a day for 30 days    Patient not taking:  Reported on 12/19/2023    Nebulizers Misc    Use with Nebulizer medication    oxyCODONE  (ROXICODONE ) 5 mg Oral Tablet    Take 2 Tablets (10 mg total) by mouth Every 4 hours as needed for up to 3 days    pantoprazole  (PROTONIX ) 40 mg Oral Tablet, Delayed Release (E.C.)    Take 1 Tablet (40 mg total) by mouth Twice daily    polyethylene glycol (MIRALAX ) 17 gram Oral Powder in Packet    Take 1 Packet (17 g total) by mouth Once per day as needed for up to 30 days    rosuvastatin  (CRESTOR ) 20 mg Oral Tablet    Take 1 Tablet (20 mg total) by mouth Every evening    saccharomyces boulardii (FLORASTOR) capsule    sennosides-docusate sodium  (SENOKOT-S) 8.6-50 mg Oral Tablet    Take 1 Tablet by mouth Twice daily for 30 days    simethicone  (MYLICON) 80 mg Oral Tablet, Chewable    Chew 1 Tablet (80 mg total) Every 6 hours as needed for up to 30 days    Patient not taking:  Reported on 12/19/2023    SITagliptin  phosphate (JANUVIA ) 100 mg Oral Tablet    Take 1 Tablet (100 mg total) by mouth Once a day    SUMAtriptan  (IMITREX ) 50 mg Oral Tablet    TAKE 1 TABLET BY MOUTH ONCE DAILY AS NEEDED FOR MIGRAINES MAY REPEAT IN 2 HOURS IF NEEDED FOR 30 DAYS DO NOT EXCEED MORE THAN 9 DOSES IN A 30 DAY PERIOD  SYMBICORT  160-4.5 mcg/actuation Inhalation oral inhaler    TAKE 2 PUFFS BY INHALATION TWICE  DAILY    vitamin B complex  Oral Tablet    Take 1 Tablet by mouth Daily            Current Medications:   Current Facility-Administered Medications   Medication Dose Route Frequency    acetaminophen  (TYLENOL ) tablet  650 mg Oral Q6H PRN    albuterol  (PROVENTIL ) 2.5 mg / 3 mL (0.083%) neb solution  2.5 mg Nebulization Q6H PRN    budesonide  (PULMICORT  RESPULES) 0.5 mg/2 mL nebulizer suspension  0.5 mg Nebulization 2x/day    And    arformoterol  (BROVANA ) 15 mcg/2 mL nebulizer solution  15 mcg Nebulization 2x/day    aspirin  chewable tablet 81 mg  81 mg Oral Daily    azelastine  (ASTELIN ) 0.1% nasal spray  1 Spray Each Nostril 2x/day    cetirizine  (zyrTEC ) tablet  10 mg Oral Daily    ciprofloxacin  (CIPRO ) tablet  500 mg Oral 2x/day    clonazePAM  (klonoPIN ) tablet  0.5 mg Oral 3x/day    Correction/SSIP insulin  lispro 100 units/mL injection  4-18 Units Subcutaneous 4x/day AC    D5W 250 mL flush bag   Intravenous Q15 Min PRN    dextrose  (GLUTOSE) 40% oral gel  15 g Oral Q15 Min PRN    dextrose  50% (0.5 g/mL) injection - syringe  12.5 g Intravenous Q15 Min PRN    fluticasone  (FLONASE ) 50 mcg per spray nasal spray  1 Spray Each Nostril 2x/day PRN    furosemide  (LASIX ) tablet  40 mg Oral Daily    gabapentin  (NEURONTIN ) capsule  400 mg Oral 2x/day    glucagon  (GLUCAGEN) injection 1 mg  1 mg IntraMUSCULAR Once PRN    heparin  10,000 units/mL injection  5,000 Units Subcutaneous Q8HRS    insulin  glargine 100 units/mL injection  65 Units Subcutaneous 2x/day    insulin  lispro 100 units/mL injection  13 Units Subcutaneous 3x/day AC    ipratropium (ATROVENT ) 0.02% nebulizer solution  0.25 mg Nebulization Q6H    ketorolac  (TORADOL ) 15 mg/mL injection  15 mg Intravenous Q6H PRN    lactulose  (ENULOSE ) 10g per 15mL oral liquid  30 mL Oral Daily    linaclotide  (LINZESS ) capsule  290 mcg Oral QAM    metroNIDAZOLE  (FLAGYL ) tablet  500 mg Oral 2x/day    micafungin  (MYCAMINE ) 150 mg in NS 100 mL IVPB  150 mg Intravenous Q24H    nicotine  (NICODERM  CQ) transdermal patch (mg/24 hr)  21 mg Transdermal Q24H    nicotine  polacrilex (COMMIT) lozenge  4 mg Oral Q1H PRN    NS 250 mL flush bag   Intravenous Q15 Min PRN    NS flush syringe  3 mL Intracatheter Q8HRS    NS flush syringe  3 mL Intracatheter Q1H PRN    ondansetron  (ZOFRAN ) 2 mg/mL injection  4 mg Intravenous Q8H PRN    oxyCODONE  (ROXICODONE ) immediate release tablet  10 mg Oral Q4H PRN    pantoprazole  (PROTONIX ) 4 mg/mL injection  40 mg Intravenous 2x/day    phenol (CHLORASEPTIC) 1.4% oromucosal spray  1 Spray Mouth/Throat Q4H PRN    rosuvastatin  (CRESTOR ) tablet  20 mg Oral QPM    sennosides-docusate sodium  (SENOKOT-S) 8.6-50mg  per tablet  1 Tablet Oral HS PRN    sucralfate  (CARAFATE ) tablet  1 g Oral Q6HRS    SUMAtriptan  (IMITREX ) tablet  25 mg Oral Q24 H PRN        Examination:  BP ROLLEN)  138/96   Pulse 91   Temp 36.6 C (97.9 F)   Resp 18   Ht 1.651 m (5' 5)   Wt (!) 138 kg (303 lb 2.1 oz)   SpO2 92%   BMI 50.44 kg/m       General: Resting comfortably, no acute distress; obese  Eyes:  Conjunctiva normal, sclera non-icteric.  Abdomen:  Soft, non-tender, non-distended  Skin:  Skin warm and dry. No jaundice  Neurologic:  Alert, Oriented, Grossly normal  Psychiatric:  Appears normal    Data/Chart reviewed:    Previous labs reviewed?  yes     CBC:  Recent Labs     12/22/23  0741 12/23/23  0701 12/24/23  0730   WBC 10.1 10.6 9.1   HGB 14.8 15.6 15.0   HCT 45.4 48.0* 45.9   PLTCNT 208 201 190       BMP:  Recent Labs     12/22/23  0741 12/23/23  0701 12/24/23  0730   SODIUM 135* 132* 132*   POTASSIUM 3.8 4.0 4.1   CHLORIDE 98 99 98   CO2 27 22 25    BUN 15 16 21    CREATININE 0.67 0.69 0.71   GFR >90 >90 >90   CALCIUM 8.7 9.1 8.9           Previous biopsy and pathology/EKG/outside records?  yes     Previous imaging reviewed?  yes     CT chest AP 12/07/2023:  1. Suspect some areas of atelectasis in the lungs. Otherwise no definite acute cardiopulmonary abnormality.  2. Hepatomegaly and fatty infiltration of  the liver.  3. Diffuse colonic stool retention.  4. Fat-containing umbilical hernia.  _________________________________________________________  Note: A portion of this documentation was generated using MMODAL (voice recognition software) and may contain syntax/voice recognition errors. The above data may have been collected via chart review, discussion with other providers, review of medical records, discussion with the patients family/friends, and or obtained by the patient.  Not all of the above information was necessarily discussed with the patient.  The above stated documentation is for the medical record and communication/coordination of care for the patients best interest.  If the patient/family has any questions about the above stated material, this can be answered at the next scheduled visit or during hospital rounds if the patient is hospitalized.

## 2023-12-24 NOTE — Progress Notes (Signed)
 [facility]  Medicine Progress Note  FULL CODE: ATTEMPT RESUSCITATION/CPR    Yvette Schmidt  Date of service: 12/24/2023    LOS: 5   Subjective:  Patient repeatedly expressing wanting to leave the hospital as she wants to smoke. Discussed with patient her active medical problems and plan of care discussed with ID on Friday. Patient family on the way to talk with patient.     Filed Vitals:    12/23/23 2204 12/24/23 0005 12/24/23 0248 12/24/23 0830   BP:  (!) 189/107  (!) 138/96   Pulse:  92  91   Resp: 16 16 16 18    Temp:  36.7 C (98.1 F)  36.6 C (97.9 F)   SpO2:  94%  92%     Oxygen Device  SpO2: 92 %  Flow (L/min) (Oxygen Therapy): 6    I have reviewed the vitals.    Input/Output    Intake/Output Summary (Last 24 hours) at 12/24/2023 1127  Last data filed at 12/24/2023 0155  Gross per 24 hour   Intake 1120 ml   Output 2400 ml   Net -1280 ml       acetaminophen  (TYLENOL ) tablet, 650 mg, Oral, Q6H PRN  albuterol  (PROVENTIL ) 2.5 mg / 3 mL (0.083%) neb solution, 2.5 mg, Nebulization, Q6H PRN  budesonide  (PULMICORT  RESPULES) 0.5 mg/2 mL nebulizer suspension, 0.5 mg, Nebulization, 2x/day   And  arformoterol  (BROVANA ) 15 mcg/2 mL nebulizer solution, 15 mcg, Nebulization, 2x/day  aspirin  chewable tablet 81 mg, 81 mg, Oral, Daily  azelastine  (ASTELIN ) 0.1% nasal spray, 1 Spray, Each Nostril, 2x/day  cetirizine  (zyrTEC ) tablet, 10 mg, Oral, Daily  ciprofloxacin  (CIPRO ) tablet, 500 mg, Oral, 2x/day  clonazePAM  (klonoPIN ) tablet, 0.5 mg, Oral, 3x/day  Correction/SSIP insulin  lispro 100 units/mL injection, 4-18 Units, Subcutaneous, 4x/day AC  D5W 250 mL flush bag, , Intravenous, Q15 Min PRN  dextrose  (GLUTOSE) 40% oral gel, 15 g, Oral, Q15 Min PRN  dextrose  50% (0.5 g/mL) injection - syringe, 12.5 g, Intravenous, Q15 Min PRN  fluticasone  (FLONASE ) 50 mcg per spray nasal spray, 1 Spray, Each Nostril, 2x/day PRN  furosemide  (LASIX ) tablet, 40 mg, Oral, Daily  gabapentin  (NEURONTIN ) capsule, 400 mg, Oral,  2x/day  glucagon  (GLUCAGEN) injection 1 mg, 1 mg, IntraMUSCULAR, Once PRN  heparin  10,000 units/mL injection, 5,000 Units, Subcutaneous, Q8HRS  insulin  glargine 100 units/mL injection, 65 Units, Subcutaneous, 2x/day  insulin  lispro 100 units/mL injection, 13 Units, Subcutaneous, 3x/day AC  ipratropium (ATROVENT ) 0.02% nebulizer solution, 0.25 mg, Nebulization, Q6H  ketorolac  (TORADOL ) 15 mg/mL injection, 15 mg, Intravenous, Q6H PRN  lactulose  (ENULOSE ) 10g per 15mL oral liquid, 30 mL, Oral, Daily  linaclotide  (LINZESS ) capsule, 290 mcg, Oral, QAM  metroNIDAZOLE  (FLAGYL ) tablet, 500 mg, Oral, 2x/day  micafungin  (MYCAMINE ) 150 mg in NS 100 mL IVPB, 150 mg, Intravenous, Q24H  nicotine  (NICODERM CQ ) transdermal patch (mg/24 hr), 21 mg, Transdermal, Q24H  nicotine  polacrilex (COMMIT) lozenge, 4 mg, Oral, Q1H PRN  NS 250 mL flush bag, , Intravenous, Q15 Min PRN  NS flush syringe, 3 mL, Intracatheter, Q8HRS  NS flush syringe, 3 mL, Intracatheter, Q1H PRN  ondansetron  (ZOFRAN ) 2 mg/mL injection, 4 mg, Intravenous, Q8H PRN  oxyCODONE  (ROXICODONE ) immediate release tablet, 10 mg, Oral, Q4H PRN  pantoprazole  (PROTONIX ) 4 mg/mL injection, 40 mg, Intravenous, 2x/day  phenol (CHLORASEPTIC) 1.4% oromucosal spray, 1 Spray, Mouth/Throat, Q4H PRN  rosuvastatin  (CRESTOR ) tablet, 20 mg, Oral, QPM  sennosides-docusate sodium  (SENOKOT-S) 8.6-50mg  per tablet, 1 Tablet, Oral, HS PRN  sucralfate  (CARAFATE )  tablet, 1 g, Oral, Q6HRS  SUMAtriptan  (IMITREX ) tablet, 25 mg, Oral, Q24 H PRN         Physical Exam:  General: No acute distress   Cardiac:  Regular rate  Respiratory:  No increased work of breathing  Abdomen: Positive bowel sounds, soft, nontender  Extremities:  Right lower extremity wound VAC in place.    CBC (Last 24 Hours):    Recent Results last 24 hours     12/24/23  0730   WBC 9.1   HGB 15.0   HCT 45.9   MCV 93.3   PLTCNT 190         BMP (Last 24 Hours):    Recent Results last 24 hours     12/24/23  0730   SODIUM 132*   POTASSIUM  4.1   CHLORIDE 98   CO2 25   BUN 21   CREATININE 0.71   CALCIUM 8.9         I have reviewed all labs.    Micro:   No results found for any visits on 12/19/23 (from the past 96 hours).      Radiology:  Results for orders placed or performed during the hospital encounter of 12/19/23 (from the past 72 hours)   XR KUB     Status: None    Narrative    MRS. Dajanee D Jia  Female, 48 years old.    XR KUB performed on 12/22/2023 1:33 PM.    REASON FOR EXAM:  recurrent n/v; chronic constipation; acute opioid use    TECHNIQUE: 1 views/1 images submitted for interpretation.    COMPARISON:  12/14/2005.      Impression    FINDINGS/IMPRESSION:  No evidence for obstruction or ileus. Minimal stool retention. No renal calculi identified radiographically.            Radiologist location ID: [id number]     XR KNEE LEFT 2 VIEW     Status: None    Narrative    MRS. Joselin D Orser    PROCEDURE DESCRIPTION: XR KNEE LEFT 2 VIEWS.  VIEWS: 2.  PROCEDURE DATE AND TIME: 12/23/2023 1:28 PM.  CLINICAL INDICATION: Left knee pain - hx of meniscus injury.  COMPARISON: Correlated with the LEFT leg radiographs dated 09/04/2006.    FINDINGS:   No acute fracture. No dislocation. No joint effusion. There is tricompartment loss of joint space and subchondral eburnation. There is spurring of tibial spines. There is osteophyte formation around all 3 compartments. Joint space loss is most advanced in the medial compartment.       Impression    1. Moderate tricompartmental DJD.   2. No acute bony abnormality is identified.       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 Rod on 11/26.  -the patient  received Dalvance  on 11/25 no need to cover Gram-positive coverage at this time  -Tissue culture from 12/05/23 with growth of candida glabrata. OR cultures from 11/26 with rare yeast, pending speciation.  Plan:  -- vascular consulted, status post OR, wound VAC now in place.  -- Infectious disease consulted, following.  --  Continue oral antibiotics with ciprofloxacin  and Flagyl , Dalvance  outpatient  -- Initiated on IV Micafungin  for yeast osteomyelitis.   -- Pain management.     History of cardiac diastolic dysfunction  Questionable CAD  -monitor IO as patient appears euvolemic  -continue aspirin   -continue Lasix      Uncontrolled type 2 diabetes mellitus complicated by peripheral neuropathy  -sugars on admission over 400  -glargine increased to 80  -added lispro t.i.d. 10 units  -SSI aggressive  -adjust insulin  regimen as necessary   -continue gabapentin  b.i.d.     Chronic Obstructive Pulmonary Disease  -continue Pulmicort  and Brovana      Hypertension  -continue Lasix    -may benefit from Ace Arb given diabetic but blood pressure is within normal limits, may benefit from low-dose on discharge     Anxiety  -Klonopin       Tobacco use disorder   -nicotine  patch p.r.n.   -nicotine  lozenge     History of constipation   -continue Linzess , lactulose      gastroesophageal reflux disease   -Protonix             ___    DVT/PE Prophylaxis - Heparin   Consults - ID and vascular surgery  Diet - regular, low calorie count  Disposition Planning - Home discharge     Aakash Hollomon Janita Bastos, MD    This note may have been partially generated using MModal Fluency Direct system, and there may be some incorrect words, spellings, and punctuation that were not noted in checking the note before saving.

## 2023-12-24 NOTE — Nurses Notes (Signed)
 Patient is alert and oriented. Remains free of falls. Patients pain treated with PRN oxycodone  today. Vitals stable. Patient was agitated and upset over not being able to go downstairs to smoke a cigarette today. Patient was threatening to leave AMA. Hospitalst was at the bedside and educated patient on risks of signing out AMA and not receiving the proper medical treatment. Patient has currently decided to stay and continue treatment. A new IV was placed by IV team. Micafungin  tolerated well. Finger sticks monitored and coverage given. Wound vac to right foot remains clean, dry, and intact. Patient is currently resting in bed with call light in reach and spouse at bedside.     BP (!) 156/99   Pulse 94   Temp 36.6 C (97.9 F)   Resp 17   Ht 1.651 m (5' 5)   Wt (!) 138 kg (303 lb 2.1 oz)   SpO2 96%   BMI 50.44 kg/m       I have reviewed this patients plan of care and will continue to monitor.     Morna Och, LPN

## 2023-12-24 NOTE — Care Plan (Signed)
 Problem: Wound  Goal: Optimal Coping  Outcome: Ongoing (see interventions/notes)  Intervention: Support Patient and Family Response  Recent Flowsheet Documentation  Taken 12/24/2023 0830 by Morna DEL, LPN  Supportive Measures: active listening utilized  Goal: Optimal Functional Ability  Outcome: Ongoing (see interventions/notes)  Intervention: Optimize Functional Ability  Recent Flowsheet Documentation  Taken 12/24/2023 1200 by Morna DEL, LPN  Activity Management: up ad lib  Activity Assistance Provided: independent  Taken 12/24/2023 1000 by Morna DEL, LPN  Activity Management: up ad lib  Activity Assistance Provided: independent  Taken 12/24/2023 0830 by Morna DEL, LPN  Activity Management: up ad lib  Activity Assistance Provided: independent  Goal: Absence of Infection Signs and Symptoms  Outcome: Ongoing (see interventions/notes)  Intervention: Prevent or Manage Infection  Recent Flowsheet Documentation  Taken 12/24/2023 0830 by Morna DEL, LPN  Fever Reduction/Comfort Measures:   lightweight bedding   lightweight clothing  Goal: Improved Oral Intake  Outcome: Ongoing (see interventions/notes)  Goal: Optimal Pain Control and Function  Outcome: Ongoing (see interventions/notes)  Intervention: Prevent or Manage Pain  Recent Flowsheet Documentation  Taken 12/24/2023 0830 by Morna DEL, LPN  Sleep/Rest Enhancement: regular sleep/rest pattern promoted  Goal: Skin Health and Integrity  Outcome: Ongoing (see interventions/notes)  Intervention: Optimize Skin Protection  Recent Flowsheet Documentation  Taken 12/24/2023 1200 by Morna DEL, LPN  Activity Management: up ad lib  Head of Bed [facility]) Positioning: HOB elevated  Taken 12/24/2023 1000 by Morna DEL, LPN  Activity Management: up ad lib  Taken 12/24/2023 0830 by Morna DEL, LPN  Pressure Reduction Techniques: Frequent weight shifting encouraged  Pressure Reduction Devices: Repositioning wedges/pillows utilized  Activity Management: up ad lib  Head of Bed (HOB)  Positioning: HOB elevated  Goal: Optimal Wound Healing  Outcome: Ongoing (see interventions/notes)  Intervention: Promote Wound Healing  Recent Flowsheet Documentation  Taken 12/24/2023 1200 by Morna DEL, LPN  Activity Management: up ad lib  Taken 12/24/2023 1000 by Morna DEL, LPN  Activity Management: up ad lib  Taken 12/24/2023 0830 by Morna DEL, LPN  Pressure Reduction Techniques: Frequent weight shifting encouraged  Pressure Reduction Devices: Repositioning wedges/pillows utilized  Sleep/Rest Enhancement: regular sleep/rest pattern promoted  Activity Management: up ad lib     Problem: Adult Inpatient Plan of Care  Goal: Absence of Hospital-Acquired Illness or Injury  Outcome: Ongoing (see interventions/notes)  Intervention: Identify and Manage Fall Risk  Recent Flowsheet Documentation  Taken 12/24/2023 1200 by Morna DEL, LPN  Safety Promotion/Fall Prevention:   activity supervised   safety round/check completed  Taken 12/24/2023 1000 by Morna DEL, LPN  Safety Promotion/Fall Prevention:   activity supervised   safety round/check completed  Taken 12/24/2023 0830 by Morna DEL, LPN  Safety Promotion/Fall Prevention:   activity supervised   safety round/check completed   nonskid shoes/slippers when out of bed  Intervention: Prevent Skin Injury  Recent Flowsheet Documentation  Taken 12/24/2023 1200 by Morna DEL, LPN  Body Position: supine, head elevated  Taken 12/24/2023 0830 by Morna DEL, LPN  Body Position: supine, head elevated  Skin Protection: adhesive use limited  Intervention: Prevent and Manage VTE (Venous Thromboembolism) Risk  Recent Flowsheet Documentation  Taken 12/24/2023 0830 by Morna DEL, LPN  VTE Prevention/Management:   ambulation promoted   dorsiflexion/plantar flexion performed  Goal: Optimal Comfort and Wellbeing  Outcome: Ongoing (see interventions/notes)  Intervention: Provide Person-Centered Care  Recent Flowsheet Documentation  Taken 12/24/2023 0830 by Morna DEL, LPN  Trust  Relationship/Rapport:   care explained   thoughts/feelings acknowledged  questions encouraged   questions answered  Goal: Rounds/Family Conference  Outcome: Ongoing (see interventions/notes)     Problem: Health Knowledge, Opportunity to Enhance (Adult,Obstetrics,Pediatric)  Goal: Knowledgeable about Health Subject/Topic  Description: Patient will demonstrate the desired outcomes by discharge/transition of care.  Outcome: Ongoing (see interventions/notes)  Intervention: Enhance Health Knowledge  Recent Flowsheet Documentation  Taken 12/24/2023 0830 by Morna DEL, LPN  Supportive Measures: active listening utilized     Problem: Surgery Nonspecified  Goal: Absence of Bleeding  Outcome: Ongoing (see interventions/notes)  Goal: Effective Bowel Elimination  Outcome: Ongoing (see interventions/notes)  Intervention: Enhance Bowel Motility and Elimination  Recent Flowsheet Documentation  Taken 12/24/2023 0830 by Morna DEL, LPN  Bowel Motility Enhancement: ambulation promoted  Goal: Fluid and Electrolyte Balance  Outcome: Ongoing (see interventions/notes)  Goal: Blood Glucose Level Within Target Range  Outcome: Ongoing (see interventions/notes)  Goal: Absence of Infection Signs and Symptoms  Outcome: Ongoing (see interventions/notes)  Intervention: Prevent or Manage Infection  Recent Flowsheet Documentation  Taken 12/24/2023 0830 by Morna DEL, LPN  Fever Reduction/Comfort Measures:   lightweight bedding   lightweight clothing  Goal: Anesthesia/Sedation Recovery  Outcome: Ongoing (see interventions/notes)  Intervention: Optimize Anesthesia Recovery  Recent Flowsheet Documentation  Taken 12/24/2023 1200 by Morna DEL, LPN  Safety Promotion/Fall Prevention:   activity supervised   safety round/check completed  Reorientation Measures: clock in view  Taken 12/24/2023 1000 by Morna DEL, LPN  Safety Promotion/Fall Prevention:   activity supervised   safety round/check completed  Taken 12/24/2023 0830 by Morna DEL, LPN  Safety  Promotion/Fall Prevention:   activity supervised   safety round/check completed   nonskid shoes/slippers when out of bed  Reorientation Measures: clock in view  Goal: Optimal Pain Control and Function  Outcome: Ongoing (see interventions/notes)  Goal: Nausea and Vomiting Relief  Outcome: Ongoing (see interventions/notes)  Goal: Effective Urinary Elimination  Outcome: Ongoing (see interventions/notes)  Goal: Effective Oxygenation and Ventilation  Outcome: Ongoing (see interventions/notes)  Intervention: Optimize Oxygenation and Ventilation  Recent Flowsheet Documentation  Taken 12/24/2023 1200 by Morna DEL, LPN  Head of Bed [facility]) Positioning: HOB elevated  Taken 12/24/2023 0830 by Morna DEL, LPN  Head of Bed (HOB) Positioning: HOB elevated     Problem: Comorbidity Management  Goal: Maintenance of Asthma Control  Outcome: Ongoing (see interventions/notes)  Intervention: Maintain Asthma Symptom Control  Recent Flowsheet Documentation  Taken 12/24/2023 0830 by Morna DEL, LPN  Medication Review/Management: medications reviewed  Goal: Maintenance of COPD Symptom Control  Outcome: Ongoing (see interventions/notes)  Intervention: Maintain COPD (Chronic Obstructive Pulmonary Disease) Symptom Control  Recent Flowsheet Documentation  Taken 12/24/2023 0830 by Morna DEL, LPN  Medication Review/Management: medications reviewed  Goal: Blood Glucose Level Within Target Range  Outcome: Ongoing (see interventions/notes)  Intervention: Monitor and Manage Glycemia  Recent Flowsheet Documentation  Taken 12/24/2023 0830 by Morna DEL, LPN  Medication Review/Management: medications reviewed  Goal: Blood Pressure in Desired Range  Outcome: Ongoing (see interventions/notes)  Intervention: Maintain Blood Pressure Management  Recent Flowsheet Documentation  Taken 12/24/2023 0830 by Morna DEL, LPN  Medication Review/Management: medications reviewed     Problem: Skin Injury Risk Increased  Goal: Skin Health and Integrity  Outcome: Ongoing (see  interventions/notes)  Intervention: Optimize Skin Protection  Recent Flowsheet Documentation  Taken 12/24/2023 1200 by Morna DEL, LPN  Activity Management: up ad lib  Head of Bed [facility]) Positioning: HOB elevated  Taken 12/24/2023 1000 by Morna DEL, LPN  Activity Management: up ad lib  Taken 12/24/2023 0830  by Morna DEL, LPN  Pressure Reduction Techniques: Frequent weight shifting encouraged  Pressure Reduction Devices: Repositioning wedges/pillows utilized  Skin Protection: adhesive use limited  Activity Management: up ad lib  Head of Bed (HOB) Positioning: HOB elevated

## 2023-12-25 DIAGNOSIS — M1712 Unilateral primary osteoarthritis, left knee: Secondary | ICD-10-CM

## 2023-12-25 LAB — BASIC METABOLIC PANEL
ANION GAP: 8 mmol/L (ref 4–13)
BUN/CREA RATIO: 29 — ABNORMAL HIGH (ref 6–22)
BUN: 19 mg/dL (ref 8–25)
CALCIUM: 9 mg/dL (ref 8.6–10.2)
CHLORIDE: 101 mmol/L (ref 96–111)
CO2 TOTAL: 27 mmol/L (ref 22–30)
CREATININE: 0.65 mg/dL (ref 0.60–1.05)
GLUCOSE: 345 mg/dL — ABNORMAL HIGH (ref 65–125)
POTASSIUM: 4.6 mmol/L (ref 3.5–5.1)
SODIUM: 136 mmol/L (ref 136–145)
eGFRcr - FEMALE: >90 mL/min/1.73mˆ2 (ref >=60)

## 2023-12-25 LAB — CBC WITH DIFF
BASOPHIL #: <0.1 x10ˆ3/uL (ref <=0.20)
BASOPHIL %: 0.7 %
EOSINOPHIL #: 0.55 x10ˆ3/uL — ABNORMAL HIGH (ref <=0.50)
EOSINOPHIL %: 6.1 %
HCT: 45.6 % (ref 34.8–46.0)
HGB: 15.4 g/dL (ref 11.5–16.0)
IMMATURE GRANULOCYTE #: <0.1 x10ˆ3/uL (ref <0.10)
IMMATURE GRANULOCYTE %: 0.3 % (ref 0.0–1.0)
LYMPHOCYTE #: 2.83 x10ˆ3/uL (ref 1.00–4.80)
LYMPHOCYTE %: 31.5 %
MCH: 31.2 pg (ref 26.0–32.0)
MCHC: 33.8 g/dL (ref 31.0–35.5)
MCV: 92.3 fL (ref 78.0–100.0)
MONOCYTE #: 0.53 x10ˆ3/uL (ref 0.20–1.10)
MONOCYTE %: 5.9 %
MPV: 11 fL (ref 8.7–12.5)
NEUTROPHIL #: 4.97 x10ˆ3/uL (ref 1.50–7.70)
NEUTROPHIL %: 55.5 %
PLATELETS: 179 x10ˆ3/uL (ref 150–400)
RBC: 4.94 x10ˆ6/uL (ref 3.85–5.22)
RDW-CV: 14.4 % (ref 11.5–15.5)
WBC: 9 x10ˆ3/uL (ref 3.7–11.0)

## 2023-12-25 LAB — MAGNESIUM: MAGNESIUM: 2 mg/dL (ref 1.8–2.6)

## 2023-12-25 LAB — POC BLOOD GLUCOSE (RESULTS)
GLUCOSE, POC: 325 mg/dL — ABNORMAL HIGH (ref 70–110)
GLUCOSE, POC: 322 mg/dL — ABNORMAL HIGH (ref 70–110)
GLUCOSE, POC: 400 mg/dL — ABNORMAL HIGH (ref 70–110)
GLUCOSE, POC: 374 mg/dL — ABNORMAL HIGH (ref 70–110)

## 2023-12-25 LAB — PHOSPHORUS: PHOSPHORUS: 4.1 mg/dL (ref 2.4–4.7)

## 2023-12-25 MED ORDER — PANTOPRAZOLE 40 MG TABLET,DELAYED RELEASE
40.0000 mg | DELAYED_RELEASE_TABLET | Freq: Two times a day (BID) | ORAL | Status: DC
  Administered 2023-12-25 – 2023-12-27 (×4): 40 mg via ORAL
  Administered 2023-12-27: 0 mg via ORAL
  Filled 2023-12-25 (×5): qty 1

## 2023-12-25 MED ORDER — INSULIN GLARGINE 100 UNITS/ML SUBQ - CHARGE BY DOSE
75.0000 [IU] | Freq: Two times a day (BID) | SUBCUTANEOUS | Status: DC
  Administered 2023-12-25 – 2023-12-27 (×5): 75 [IU] via SUBCUTANEOUS
  Filled 2023-12-25: qty 75
  Filled 2023-12-25: qty 1
  Filled 2023-12-25: qty 75
  Filled 2023-12-25: qty 1
  Filled 2023-12-25: qty 75

## 2023-12-25 NOTE — Consults (Signed)
 Infectious Diseases  Follow up note          Yvette Schmidt       [id number]       Date of service: 12/25/2023  Date of Admission:  12/19/2023    Hospital Day:  LOS: 6 days     Assessment/Recommendations:   Ms.Yvette Schmidt is a 48 y.o. 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  Patient was ultimately discharged to complete a course of IV dalbavancin.  Operative pathology noted persistent osteomyelitis at the bony margins.  Patient then directly readmitted from vascular surgery clinic due to poor wound healing.  Upon readmission she was taken back to the OR on 12/20/2023 and underwent right foot exploration with further debridement excisional removal of nonviable skin, subcutaneous tissue, fat, fascia, muscle, tendon with washout and open packing of the wounds.     Unfortunately previous operative cultures obtained 12/05/2023 are now also growing Candida glabrata.  Operative cultures obtained this admission on 12/20/2023 are pending with yeast seen on Gram stain.    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  Patient very upset and reports she will refuse inpatient stay and IV micafungin .  Discussed with her risks and benefits of high-dose fluconazole .  Patient would likely prefer transitioned to high-dose fluconazole  for treatment of her fungal osteomyelitis.  Discussed prolonged antimicrobial course including 6 months for fungal osteomyelitis depending on pathology.  Patient voices understanding.  Will continue follow operative cultures for identification susceptibilities for yeast.  For now will continue IV micafungin  150 mg Q 24  hours.    Would also continue ciprofloxacin  500 mg b.i.d. and Flagyl  500 mg b.i.d. for planned 6 week course due to osteomyelitis.  End date remains the same.    Continue local wound care and wound VAC per vascular surgery Service.    Case discussed with primary service.    Subjective:  Patient seen this morning resting in bed with husband at bedside.  Patient's mother present for conversation via telephone speaker.  Patient very irate during conversation and yelling out multiple times.  Patient very adamant she would like to be discharged home as soon as possible.  She is also adamant she will not stay for IV antimicrobial treatment.      Review of systems:  Remainder of review of systems negative unless indicated above.     Objective:   Filed Vitals:    12/24/23 2112 12/24/23 2216 12/25/23 0015 12/25/23 0743   BP: (!) 166/104 (!) 142/73 (!) 145/105 (!) 155/101   Pulse: (!) 105 (!) 103 93 100   Resp: 17  20 18    Temp: 36.8 C (98.2 F)  36.8 C (98.2 F) 36.6 C (97.9 F)   SpO2: 95%  94% 95%         Intake/Output Summary (Last 24 hours) at 12/25/2023 1107  Last data filed at 12/25/2023 1010  Gross per 24 hour   Intake 835 ml   Output 6700 ml   Net -5865 ml       I have reviewed the vitals.    General:  Awake alert sitting up in bed.  Husband at bedside.  CV: RRR   Respiratory:  Breathing comfortably, no tachypnea or increased work of breathing  Extremities:  Wound VAC in place  Neurological:  Awake alert and answering questions appropriately      Labs:   CBC with Diff (Last 24 Hours):    Recent Results last 24 hours     12/25/23  0625   WBC 9.0   HGB 15.4   HCT 45.6   MCV 92.3   PLTCNT 179   PMNS 55.5   LYMPHO 31.5   MONOCYTES 5.9   EOSINO 6.1   BASOPHILS 0.7  <0.10       BMP (Last 24 Hours):    Recent Results last 24 hours     12/25/23  0625   SODIUM 136   POTASSIUM 4.6   CHLORIDE 101   CO2 27   BUN 19   CREATININE 0.65   CALCIUM 9.0       I have reviewed labs.        Current Medications[1]    Micro: No results  found for any visits on 12/19/23 (from the past 96 hours).    Radiology:    Results for orders placed or performed during the hospital encounter of 12/19/23 (from the past 72 hours)   XR KUB     Status: None    Narrative    MRS. Yvette Schmidt  Female, 48 years old.    XR KUB performed on 12/22/2023 1:33 PM.    REASON FOR EXAM:  recurrent n/v; chronic constipation; acute opioid use    TECHNIQUE: 1 views/1 images submitted for interpretation.    COMPARISON:  12/14/2005.      Impression    FINDINGS/IMPRESSION:  No evidence for obstruction or ileus. Minimal stool retention. No renal calculi identified radiographically.            Radiologist location ID: [id number]     XR KNEE LEFT 2 VIEW     Status: None    Narrative    MRS. Yvette Schmidt    PROCEDURE DESCRIPTION: XR KNEE LEFT 2 VIEWS.  VIEWS: 2.  PROCEDURE DATE AND TIME: 12/23/2023 1:28 PM.  CLINICAL INDICATION: Left knee pain - hx of meniscus injury.  COMPARISON: Correlated with the LEFT leg radiographs dated 09/04/2006.    FINDINGS:   No acute fracture. No dislocation. No joint effusion. There is tricompartment loss of joint space and subchondral eburnation. There is spurring of tibial spines. There is osteophyte formation around all 3 compartments. Joint space loss is most advanced in the medial compartment.       Impression    1. Moderate tricompartmental DJD.   2. No acute bony abnormality is identified.       Radiologist location ID: [id number]              [facility], [place]    Patient was not seen or examined today, I was available for and reviewed the patient's case and recommendations with H. Summerfield, PA-C. I have reviewed the consult note and agree with the findings and plan of care as documented.  Any exceptions/additions have been edited/noted above.        Dorn Bars, DO  [facility] Infectious Diseases           [1]   Current Facility-Administered Medications:     acetaminophen  (TYLENOL ) tablet, 650 mg, Oral, Q6H PRN, Zegeer, Joshua,  MD, 650 mg at 12/23/23 0915    albuterol  (PROVENTIL ) 2.5 mg / 3 mL (0.083%) neb solution, 2.5 mg, Nebulization, Q6H PRN,  Jann Maria, MD    budesonide  (PULMICORT  RESPULES) 0.5 mg/2 mL nebulizer suspension, 0.5 mg, Nebulization, 2x/day, 0.5 mg at 12/25/23 0756 **AND** arformoterol  (BROVANA ) 15 mcg/2 mL nebulizer solution, 15 mcg, Nebulization, 2x/day, Jann Maria, MD, 15 mcg at 12/25/23 9243    aspirin  chewable tablet 81 mg, 81 mg, Oral, Daily, Jann Maria, MD, 81 mg at 12/25/23 0955    azelastine  (ASTELIN ) 0.1% nasal spray, 1 Spray, Each Nostril, 2x/day, Janita Hilarie Nakai, MD, 1 Spray at 12/25/23 0645    cetirizine  (zyrTEC ) tablet, 10 mg, Oral, Daily, Villarreal Aguirre, Leonardo, MD, 10 mg at 12/25/23 9044    ciprofloxacin  (CIPRO ) tablet, 500 mg, Oral, 2x/day, Jann Maria, MD, 500 mg at 12/25/23 9044    clonazePAM  (klonoPIN ) tablet, 0.5 mg, Oral, 3x/day, Villarreal Aguirre, Leonardo, MD, 0.5 mg at 12/25/23 9044    Correction/SSIP insulin  lispro 100 units/mL injection, 4-18 Units, Subcutaneous, 4x/day AC, Janita Hilarie, Nakai, MD, 14 Units at 12/25/23 1042    D5W 250 mL flush bag, , Intravenous, Q15 Min PRN, Jann Maria, MD    dextrose  (GLUTOSE) 40% oral gel, 15 g, Oral, Q15 Min PRN, Jann Maria, MD    dextrose  50% (0.5 g/mL) injection - syringe, 12.5 g, Intravenous, Q15 Min PRN, Jann Maria, MD    fluticasone  (FLONASE ) 50 mcg per spray nasal spray, 1 Spray, Each Nostril, 2x/day PRN, Jann Maria, MD, 1 Spray at 12/25/23 [phone]    furosemide  (LASIX ) tablet, 40 mg, Oral, Daily, Jann Maria, MD, 40 mg at 12/24/23 1324    gabapentin  (NEURONTIN ) capsule, 400 mg, Oral, 2x/day, Jann Maria, MD, 400 mg at 12/25/23 9044    glucagon  (GLUCAGEN) injection 1 mg, 1 mg, IntraMUSCULAR, Once PRN, Jann Maria, MD    heparin  10,000 units/mL injection, 5,000 Units, Subcutaneous, Q8HRS, Jann Maria, MD    insulin  glargine 100 units/mL  injection, 75 Units, Subcutaneous, 2x/day, Janita Hilarie, Leonardo, MD    insulin  lispro 100 units/mL injection, 13 Units, Subcutaneous, 3x/day AC, Villarreal Aguirre, Nakai, MD, 13 Units at 12/25/23 [phone]    ipratropium (ATROVENT ) 0.02% nebulizer solution, 0.25 mg, Nebulization, Q6H, Jann Maria, MD, 0.25 mg at 12/25/23 0755    ketorolac  (TORADOL ) 15 mg/mL injection, 15 mg, Intravenous, Q6H PRN, Villarreal Aguirre, Leonardo, MD, 15 mg at 12/25/23 0009    lactulose  (ENULOSE ) 10g per 15mL oral liquid, 30 mL, Oral, Daily, Jann Maria, MD    linaclotide  (LINZESS ) capsule, 290 mcg, Oral, QAM, Estrada, Alejandro, MD, 290 mcg at 12/25/23 9360    metroNIDAZOLE  (FLAGYL ) tablet, 500 mg, Oral, 2x/day, Jann Maria, MD, 500 mg at 12/25/23 9044    micafungin  (MYCAMINE ) 150 mg in NS 100 mL IVPB, 150 mg, Intravenous, Q24H, Karenann Sluder May, PA-C, Stopped at 12/24/23 1424    nicotine  (NICODERM CQ ) transdermal patch (mg/24 hr), 21 mg, Transdermal, Q24H, Villarreal Aguirre, Nakai, MD, Patch Removed at 12/24/23 1151    nicotine  polacrilex (COMMIT) lozenge, 4 mg, Oral, Q1H PRN, Jann Maria, MD, 4 mg at 12/21/23 1520    NS 250 mL flush bag, , Intravenous, Q15 Min PRN, Jann Maria, MD    NS flush syringe, 3 mL, Intracatheter, Q8HRS, Jann Maria, MD, 3 mL at 12/25/23 0600    NS flush syringe, 3 mL, Intracatheter, Q1H PRN, Jann Maria, MD    ondansetron  (ZOFRAN ) 2 mg/mL injection, 4 mg, Intravenous, Q8H PRN, Zegeer, Joshua, MD, 4 mg at 12/21/23 2207    oxyCODONE  (ROXICODONE ) immediate release tablet, 10 mg, Oral, Q4H PRN, Villarreal Aguirre, Nakai, MD, 10 mg at 12/25/23 [phone]  pantoprazole  (PROTONIX ) delayed release tablet, 40 mg, Oral, 2x/day AC, Villarreal Aguirre, Leonardo, MD    phenol (CHLORASEPTIC) 1.4% oromucosal spray, 1 Spray, Mouth/Throat, Q4H PRN, Jann Maria, MD, 1 Spray at 12/21/23 1257    rosuvastatin  (CRESTOR ) tablet, 20 mg, Oral, QPM, Jann Maria, MD, 20 mg at 12/24/23 2058    sennosides-docusate sodium  (SENOKOT-S) 8.6-50mg  per tablet, 1 Tablet, Oral, HS PRN, Jann Maria, MD    sucralfate  (CARAFATE ) tablet, 1 g, Oral, Q6HRS, Cutright, [name], APRN, CNP, 1 g at 12/25/23 [phone]

## 2023-12-25 NOTE — Pharmacy (Signed)
 [facility] Pharmacy Department  IV to PO Automatic Conversion Protocol            Patient Name: Yvette Schmidt  Date of Birth: 07/11/1975  MRN: [id number]  CSN: [id number]      The following medication order was changed from the intravenous (IV) route to the oral (PO) route (or via feeding tube) by the pharmacist for Yvette Schmidt in accordance with the IV to PO Automatic Conversion Protocol.    Original Medication Order:   - Protonix  40 mg IV q12h         Converted Medication Order:   - Protonix  40 mg po BID       Aleck Elder, [facility]  12/25/2023  09:13

## 2023-12-25 NOTE — Nurses Notes (Signed)
 Discussed plan of care with patient and sister in the presence of VP of Nursing , Case Management and bedside nurse.  Patient and family are agreeable to home wound vac, IV antibiotics, antifungal and home health care.  Case Management submitting wound vac application this pm.  Infectious Disease, Primary MD and Dr. Adeniyi updated regarding patient's decision on discharge plan.Steva Blake, RN  '

## 2023-12-25 NOTE — Care Management Notes (Incomplete)
 12/21/23 1000 12/21/23 2000 12/22/23 0000   Wound Vac Right Dorsal Foot   Placement Date/Time: 12/21/23 1001   Present on Original Admission: No  Inserted By:: Physician @ Bedside  Wound Vac Type: Regular  Orientation: Right  Location: Dorsal Foot   LDAW Image   --   --    Number of Pieces 1  --   --    Vac Check Attached & Sealed;Functioning Properly;Settings As Ordered Settings As Ordered;Functioning Properly;Attached & Sealed Settings As Ordered;Functioning Properly;Attached & Sealed   Wound Length (cm) 11 cm  --   --    Wound Width (cm) 5 cm  --   --    Wound Depth (cm) 2 cm  --   --    Wound Volume (cm^3) 57.596 cm^3  --   --    Wound Surface Area (cm^2) 43.2 cm^2  --   --    Dressing Type Granulofoam (Black Foam);Negative Pressure (Vacuum) Granulofoam (Black Foam) Granulofoam (Black Foam)   Dressing Status Changed/ New Clean, Dry and Intact Clean, Dry and Intact   Date Wound Dressing Changed 12/21/23  --   --    Drainage Description None None Serosanguineous

## 2023-12-25 NOTE — Nurses Notes (Signed)
 Pt alert and oriented. Pivot independently to bedside commode. Wound vac patent. Blood sugars monitored and covered per providers orders. Pt had complaints of pain, given prn pain medication. Free from falls. Family at bedside. Joya Bosworth, LPN

## 2023-12-25 NOTE — Progress Notes (Signed)
 [facility]  Medicine Progress Note  FULL CODE: ATTEMPT RESUSCITATION/CPR    Yvette Schmidt  Date of service: 12/25/2023    LOS: 6   Subjective: No acute events overnight. Case discussed with ID.    Filed Vitals:    12/24/23 2112 12/24/23 2216 12/25/23 0015 12/25/23 0743   BP: (!) 166/104 (!) 142/73 (!) 145/105 (!) 155/101   Pulse: (!) 105 (!) 103 93 100   Resp: 17  20 18    Temp: 36.8 C (98.2 F)  36.8 C (98.2 F) 36.6 C (97.9 F)   SpO2: 95%  94% 95%     Oxygen Device  SpO2: 95 %  $ O2 Delivery: None (Room Air)  Flow (L/min) (Oxygen Therapy): 6    I have reviewed the vitals.    Input/Output    Intake/Output Summary (Last 24 hours) at 12/25/2023 1142  Last data filed at 12/25/2023 1010  Gross per 24 hour   Intake 835 ml   Output 6700 ml   Net -5865 ml       acetaminophen  (TYLENOL ) tablet, 650 mg, Oral, Q6H PRN  albuterol  (PROVENTIL ) 2.5 mg / 3 mL (0.083%) neb solution, 2.5 mg, Nebulization, Q6H PRN  budesonide  (PULMICORT  RESPULES) 0.5 mg/2 mL nebulizer suspension, 0.5 mg, Nebulization, 2x/day   And  arformoterol  (BROVANA ) 15 mcg/2 mL nebulizer solution, 15 mcg, Nebulization, 2x/day  aspirin  chewable tablet 81 mg, 81 mg, Oral, Daily  azelastine  (ASTELIN ) 0.1% nasal spray, 1 Spray, Each Nostril, 2x/day  cetirizine  (zyrTEC ) tablet, 10 mg, Oral, Daily  ciprofloxacin  (CIPRO ) tablet, 500 mg, Oral, 2x/day  clonazePAM  (klonoPIN ) tablet, 0.5 mg, Oral, 3x/day  Correction/SSIP insulin  lispro 100 units/mL injection, 4-18 Units, Subcutaneous, 4x/day AC  D5W 250 mL flush bag, , Intravenous, Q15 Min PRN  dextrose  (GLUTOSE) 40% oral gel, 15 g, Oral, Q15 Min PRN  dextrose  50% (0.5 g/mL) injection - syringe, 12.5 g, Intravenous, Q15 Min PRN  fluticasone  (FLONASE ) 50 mcg per spray nasal spray, 1 Spray, Each Nostril, 2x/day PRN  furosemide  (LASIX ) tablet, 40 mg, Oral, Daily  gabapentin  (NEURONTIN ) capsule, 400 mg, Oral, 2x/day  glucagon  (GLUCAGEN) injection 1 mg, 1 mg, IntraMUSCULAR, Once PRN  heparin  10,000 units/mL  injection, 5,000 Units, Subcutaneous, Q8HRS  insulin  glargine 100 units/mL injection, 75 Units, Subcutaneous, 2x/day  insulin  lispro 100 units/mL injection, 13 Units, Subcutaneous, 3x/day AC  ipratropium (ATROVENT ) 0.02% nebulizer solution, 0.25 mg, Nebulization, Q6H  ketorolac  (TORADOL ) 15 mg/mL injection, 15 mg, Intravenous, Q6H PRN  lactulose  (ENULOSE ) 10g per 15mL oral liquid, 30 mL, Oral, Daily  linaclotide  (LINZESS ) capsule, 290 mcg, Oral, QAM  metroNIDAZOLE  (FLAGYL ) tablet, 500 mg, Oral, 2x/day  micafungin  (MYCAMINE ) 150 mg in NS 100 mL IVPB, 150 mg, Intravenous, Q24H  nicotine  (NICODERM CQ ) transdermal patch (mg/24 hr), 21 mg, Transdermal, Q24H  nicotine  polacrilex (COMMIT) lozenge, 4 mg, Oral, Q1H PRN  NS 250 mL flush bag, , Intravenous, Q15 Min PRN  NS flush syringe, 3 mL, Intracatheter, Q8HRS  NS flush syringe, 3 mL, Intracatheter, Q1H PRN  ondansetron  (ZOFRAN ) 2 mg/mL injection, 4 mg, Intravenous, Q8H PRN  oxyCODONE  (ROXICODONE ) immediate release tablet, 10 mg, Oral, Q4H PRN  pantoprazole  (PROTONIX ) delayed release tablet, 40 mg, Oral, 2x/day AC  phenol (CHLORASEPTIC) 1.4% oromucosal spray, 1 Spray, Mouth/Throat, Q4H PRN  rosuvastatin  (CRESTOR ) tablet, 20 mg, Oral, QPM  sennosides-docusate sodium  (SENOKOT-S) 8.6-50mg  per tablet, 1 Tablet, Oral, HS PRN  sucralfate  (CARAFATE ) tablet, 1 g, Oral, Q6HRS         Physical Exam:  General:  No acute distress   Cardiac:  Regular rate  Respiratory:  No increased work of breathing  Abdomen: Positive bowel sounds, soft, nontender  Extremities:  Right lower extremity wound VAC in place.    CBC (Last 24 Hours):    Recent Results last 24 hours     12/25/23  0625   WBC 9.0   HGB 15.4   HCT 45.6   MCV 92.3   PLTCNT 179         BMP (Last 24 Hours):    Recent Results last 24 hours     12/25/23  0625   SODIUM 136   POTASSIUM 4.6   CHLORIDE 101   CO2 27   BUN 19   CREATININE 0.65   CALCIUM 9.0         I have reviewed all labs.    Micro:   No results found for any visits on  12/19/23 (from the past 96 hours).      Radiology:  Results for orders placed or performed during the hospital encounter of 12/19/23 (from the past 72 hours)   XR KUB     Status: None    Narrative    MRS. Yvette Schmidt  Female, 48 years old.    XR KUB performed on 12/22/2023 1:33 PM.    REASON FOR EXAM:  recurrent n/v; chronic constipation; acute opioid use    TECHNIQUE: 1 views/1 images submitted for interpretation.    COMPARISON:  12/14/2005.      Impression    FINDINGS/IMPRESSION:  No evidence for obstruction or ileus. Minimal stool retention. No renal calculi identified radiographically.            Radiologist location ID: [id number]     XR KNEE LEFT 2 VIEW     Status: None    Narrative    MRS. Yvette Schmidt    PROCEDURE DESCRIPTION: XR KNEE LEFT 2 VIEWS.  VIEWS: 2.  PROCEDURE DATE AND TIME: 12/23/2023 1:28 PM.  CLINICAL INDICATION: Left knee pain - hx of meniscus injury.  COMPARISON: Correlated with the LEFT leg radiographs dated 09/04/2006.    FINDINGS:   No acute fracture. No dislocation. No joint effusion. There is tricompartment loss of joint space and subchondral eburnation. There is spurring of tibial spines. There is osteophyte formation around all 3 compartments. Joint space loss is most advanced in the medial compartment.       Impression    1. Moderate tricompartmental DJD.   2. No acute bony abnormality is identified.       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 Rod on 11/26.  -the patient received Dalvance  on 11/25 no need to cover Gram-positive coverage at this time  -Tissue culture from 12/05/23 with growth of candida glabrata. OR cultures  from 11/26 with rare yeast, pending speciation.  Plan:  -- vascular consulted, status post OR, wound VAC now in place.  -- Infectious disease consulted, following.  -- Continue oral antibiotics with ciprofloxacin  and Flagyl  for planned 6 week course for osteomyelitis.  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 Pending operative cultures for  identification and susceptibilities for yeast from 11/26. Per patient preference plan to possibly transition to high-dose fluconazole  for treatment of fungal osteomyelitis for 6 month course.  -- Pain management.     History of cardiac diastolic dysfunction  Questionable CAD  -monitor IO as patient appears euvolemic  -continue aspirin   -continue Lasix      Uncontrolled type 2 diabetes mellitus complicated by peripheral neuropathy  -sugars on admission over 400  -glargine increased to 80  -added lispro t.i.d. 10 units  -SSI aggressive  -adjust insulin  regimen as necessary   -continue gabapentin  b.i.d.     Chronic Obstructive Pulmonary Disease  -continue Pulmicort  and Brovana      Hypertension  -continue Lasix    -may benefit from Ace Arb given diabetic but blood pressure is within normal limits, may benefit from low-dose on discharge     Anxiety  -Klonopin       Tobacco use disorder   -nicotine  patch p.r.n.   -nicotine  lozenge     History of constipation   -continue Linzess , lactulose      gastroesophageal reflux disease   -Protonix             ___    DVT/PE Prophylaxis - Heparin   Consults - ID and vascular surgery  Diet - regular, low calorie count  Disposition Planning - Home discharge     Amri Lien Janita Bastos, MD    This note may have been partially generated using MModal Fluency Direct system, and there may be some incorrect words, spellings, and punctuation that were not noted in checking the note before saving.

## 2023-12-25 NOTE — Consults (Signed)
 VASCULAR SURGERY PROGRESS NOTE      Yvette Schmidt, Rozman  Date of Admission:  12/19/2023  Date of Birth:  31-Mar-1975    Hospital Day:  LOS: 6 days   Post-op Day:  5 Days Post-Op, S/P  right foot debridement      Patient is currently refusing to go to a skilled nursing facility    Patient is also refusing home health    Patient wants to go home    She understands that she is going against previously agreed upon treatment plan    She had failed outpatient management and she will likely we will fail again due to noncompliance    Patient will be given AMA papers to sign prior to discharge.     Norleen DELENA Robe, MD  12/25/2023, 20:37

## 2023-12-25 NOTE — Care Plan (Signed)
 Problem: Wound  Goal: Optimal Coping  Outcome: Ongoing (see interventions/notes)  Goal: Optimal Functional Ability  Outcome: Ongoing (see interventions/notes)  Goal: Absence of Infection Signs and Symptoms  Outcome: Ongoing (see interventions/notes)  Intervention: Prevent or Manage Infection  Recent Flowsheet Documentation  Taken 12/25/2023 0955 by [name], LPN  Fever Reduction/Comfort Measures:   lightweight bedding   lightweight clothing  Goal: Improved Oral Intake  Outcome: Ongoing (see interventions/notes)  Goal: Optimal Pain Control and Function  Outcome: Ongoing (see interventions/notes)  Goal: Skin Health and Integrity  Outcome: Ongoing (see interventions/notes)  Intervention: Optimize Skin Protection  Recent Flowsheet Documentation  Taken 12/25/2023 0955 by [name], LPN  Pressure Reduction Techniques: Frequent weight shifting encouraged  Goal: Optimal Wound Healing  Outcome: Ongoing (see interventions/notes)  Intervention: Promote Wound Healing  Recent Flowsheet Documentation  Taken 12/25/2023 0955 by [name], LPN  Pressure Reduction Techniques: Frequent weight shifting encouraged     Problem: Adult Inpatient Plan of Care  Goal: Absence of Hospital-Acquired Illness or Injury  Outcome: Ongoing (see interventions/notes)  Intervention: Identify and Manage Fall Risk  Recent Flowsheet Documentation  Taken 12/25/2023 1200 by [name], LPN  Safety Promotion/Fall Prevention:   nonskid shoes/slippers when out of bed   safety round/check completed   fall prevention program maintained  Taken 12/25/2023 0955 by [name], LPN  Safety Promotion/Fall Prevention:   nonskid shoes/slippers when out of bed   safety round/check completed   fall prevention program maintained  Intervention: Prevent Skin Injury  Recent Flowsheet Documentation  Taken 12/25/2023 0955 by [name], LPN  Skin Protection: adhesive use limited  Intervention: Prevent and Manage VTE (Venous Thromboembolism) Risk  Recent Flowsheet Documentation  Taken  12/25/2023 0955 by [name], LPN  VTE Prevention/Management: ambulation promoted  Goal: Optimal Comfort and Wellbeing  Outcome: Ongoing (see interventions/notes)  Goal: Rounds/Family Conference  Outcome: Ongoing (see interventions/notes)     Problem: Health Knowledge, Opportunity to Enhance (Adult,Obstetrics,Pediatric)  Goal: Knowledgeable about Health Subject/Topic  Description: Patient will demonstrate the desired outcomes by discharge/transition of care.  Outcome: Ongoing (see interventions/notes)     Problem: Surgery Nonspecified  Goal: Absence of Bleeding  Outcome: Ongoing (see interventions/notes)  Goal: Effective Bowel Elimination  Outcome: Ongoing (see interventions/notes)  Goal: Fluid and Electrolyte Balance  Outcome: Ongoing (see interventions/notes)  Goal: Blood Glucose Level Within Target Range  Outcome: Ongoing (see interventions/notes)  Goal: Absence of Infection Signs and Symptoms  Outcome: Ongoing (see interventions/notes)  Intervention: Prevent or Manage Infection  Recent Flowsheet Documentation  Taken 12/25/2023 0955 by [name], LPN  Fever Reduction/Comfort Measures:   lightweight bedding   lightweight clothing  Goal: Anesthesia/Sedation Recovery  Outcome: Ongoing (see interventions/notes)  Intervention: Optimize Anesthesia Recovery  Recent Flowsheet Documentation  Taken 12/25/2023 1200 by [name], LPN  Safety Promotion/Fall Prevention:   nonskid shoes/slippers when out of bed   safety round/check completed   fall prevention program maintained  Taken 12/25/2023 0955 by [name], LPN  Safety Promotion/Fall Prevention:   nonskid shoes/slippers when out of bed   safety round/check completed   fall prevention program maintained  Goal: Optimal Pain Control and Function  Outcome: Ongoing (see interventions/notes)  Goal: Nausea and Vomiting Relief  Outcome: Ongoing (see interventions/notes)  Goal: Effective Urinary Elimination  Outcome: Ongoing (see interventions/notes)  Goal: Effective Oxygenation and  Ventilation  Outcome: Ongoing (see interventions/notes)     Problem: Comorbidity Management  Goal: Maintenance of Asthma Control  Outcome: Ongoing (see interventions/notes)  Goal: Maintenance of COPD Symptom  Control  Outcome: Ongoing (see interventions/notes)  Goal: Blood Glucose Level Within Target Range  Outcome: Ongoing (see interventions/notes)  Goal: Blood Pressure in Desired Range  Outcome: Ongoing (see interventions/notes)     Problem: Skin Injury Risk Increased  Goal: Skin Health and Integrity  Outcome: Ongoing (see interventions/notes)  Intervention: Optimize Skin Protection  Recent Flowsheet Documentation  Taken 12/25/2023 0955 by [name], LPN  Pressure Reduction Techniques: Frequent weight shifting encouraged  Skin Protection: adhesive use limited

## 2023-12-26 LAB — CBC WITH DIFF
BASOPHIL #: <0.1 x10ˆ3/uL (ref <=0.20)
BASOPHIL %: 0.7 %
EOSINOPHIL #: 0.44 x10ˆ3/uL (ref <=0.50)
EOSINOPHIL %: 4.8 %
HCT: 47.4 % — ABNORMAL HIGH (ref 34.8–46.0)
HGB: 15.7 g/dL (ref 11.5–16.0)
IMMATURE GRANULOCYTE #: <0.1 x10ˆ3/uL (ref <0.10)
IMMATURE GRANULOCYTE %: 0.2 % (ref 0.0–1.0)
LYMPHOCYTE #: 2.86 x10ˆ3/uL (ref 1.00–4.80)
LYMPHOCYTE %: 31.2 %
MCH: 30.8 pg (ref 26.0–32.0)
MCHC: 33.1 g/dL (ref 31.0–35.5)
MCV: 92.9 fL (ref 78.0–100.0)
MONOCYTE #: 0.61 x10ˆ3/uL (ref 0.20–1.10)
MONOCYTE %: 6.7 %
MPV: 11.3 fL (ref 8.7–12.5)
NEUTROPHIL #: 5.17 x10ˆ3/uL (ref 1.50–7.70)
NEUTROPHIL %: 56.4 %
PLATELETS: 212 x10ˆ3/uL (ref 150–400)
RBC: 5.1 x10ˆ6/uL (ref 3.85–5.22)
RDW-CV: 14.6 % (ref 11.5–15.5)
WBC: 9.2 x10ˆ3/uL (ref 3.7–11.0)

## 2023-12-26 LAB — SURGICAL PATHOLOGY SPECIMEN

## 2023-12-26 LAB — COMPREHENSIVE METABOLIC PANEL, NON-FASTING
ALBUMIN: 3.2 g/dL — ABNORMAL LOW (ref 3.5–5.0)
ALKALINE PHOSPHATASE: 123 U/L — ABNORMAL HIGH (ref 40–110)
ALT (SGPT): 9 U/L (ref <31)
ANION GAP: 9 mmol/L (ref 4–13)
AST (SGOT): 25 U/L (ref 11–34)
BILIRUBIN TOTAL: 0.3 mg/dL (ref 0.3–1.3)
BUN/CREA RATIO: 27 — ABNORMAL HIGH (ref 6–22)
BUN: 20 mg/dL (ref 8–25)
CALCIUM: 8.9 mg/dL (ref 8.6–10.2)
CHLORIDE: 100 mmol/L (ref 96–111)
CO2 TOTAL: 26 mmol/L (ref 22–30)
CREATININE: 0.74 mg/dL (ref 0.60–1.05)
GLUCOSE: 360 mg/dL — ABNORMAL HIGH (ref 65–125)
POTASSIUM: 4.3 mmol/L (ref 3.5–5.1)
PROTEIN TOTAL: 6.8 g/dL (ref 6.4–8.3)
SODIUM: 135 mmol/L — ABNORMAL LOW (ref 136–145)
eGFRcr - FEMALE: >90 mL/min/1.73mˆ2 (ref >=60)

## 2023-12-26 LAB — MAGNESIUM: MAGNESIUM: 1.8 mg/dL (ref 1.8–2.6)

## 2023-12-26 LAB — POC BLOOD GLUCOSE (RESULTS)
GLUCOSE, POC: 374 mg/dL — ABNORMAL HIGH (ref 70–110)
GLUCOSE, POC: 355 mg/dL — ABNORMAL HIGH (ref 70–110)
GLUCOSE, POC: 340 mg/dL — ABNORMAL HIGH (ref 70–110)
GLUCOSE, POC: 396 mg/dL — ABNORMAL HIGH (ref 70–110)

## 2023-12-26 LAB — PHOSPHORUS: PHOSPHORUS: 3.6 mg/dL (ref 2.4–4.7)

## 2023-12-26 MED ORDER — FLUCONAZOLE 200 MG TABLET: 400.0000 mg | ORAL_TABLET | Freq: Every day | ORAL | 1 refills | Status: AC

## 2023-12-26 MED ORDER — TRAMADOL 50 MG TABLET
50.0000 mg | ORAL_TABLET | ORAL | Status: DC | PRN
  Administered 2023-12-26 – 2023-12-27 (×3): 50 mg via ORAL
  Filled 2023-12-26 (×3): qty 1

## 2023-12-26 MED ORDER — FLUCONAZOLE 200 MG TABLET
400.0000 mg | ORAL_TABLET | Freq: Every day | ORAL | Status: DC
  Administered 2023-12-26 – 2023-12-27 (×2): 400 mg via ORAL
  Filled 2023-12-26 (×4): qty 2

## 2023-12-26 MED ORDER — CIPROFLOXACIN 500 MG TABLET: 500.0000 mg | ORAL_TABLET | Freq: Two times a day (BID) | ORAL | 0 refills | Status: DC

## 2023-12-26 MED ORDER — METRONIDAZOLE 500 MG TABLET: 500.0000 mg | ORAL_TABLET | Freq: Two times a day (BID) | ORAL | 0 refills | Status: DC

## 2023-12-26 NOTE — Care Plan (Signed)
 [facility]  Rehabilitation Services  Physical Therapy Progress Note    Patient Name: Yvette Schmidt  Date of Birth: 10-19-1975  Height:  165.1 cm (5' 5)  Weight:  (!) 138 kg (303 lb 2.1 oz)  Room/Bed: 4108/A  Payor: [employer]ICA[employer] / Plan: [employer]ICAID / Product Type: Medicaid [facility] /     Assessment:     Pt with deficits in strength, balance, endurance, transfer ability, and ambulation quality and endurance. Pt performed bed mobility with MI and use of rail. pt with some difficulty reaching EOB due to weakness and pain. Pt performed transfers with SBA and FWW. Pt requireed verbal cues for reaching back to sit and pushing up to stand. Pt ambulated 300' with SBA and FWW. Pt requried verbal cues for increased step height and length. Pt is not currently functioning at established baseline. PT recommends home with assist and home health services once medically stable for D/C.    Discharge Needs:   Equipment Recommendation: none anticipated    Discharge Disposition: home with assist, home with home health    JUSTIFICATION OF DISCHARGE RECOMMENDATION   Based on current diagnosis, functional performance prior to admission, and current functional performance, this patient requires continued PT services in home with assist, home with home health in order to achieve significant functional improvements in these deficit areas: aerobic capacity/endurance, gait, locomotion, and balance, muscle performance.      Plan:   Continue to follow patient according to established plan of care.  The risks/benefits of therapy have been discussed with the patient/caregiver and he/she is in agreement with the established plan of care.     Subjective & Objective:        12/26/23 1308   Therapist Pager   PT Assigned/ Phone # Rylinn Linzy [phone]   Rehab Session   Document Type therapy progress note (daily note)   PT Visit Date 12/26/23   Total PT Minutes: 10  (8:59-9:09)   Patient Effort good   Symptoms Noted During/After  Treatment fatigue;increased pain   General Information   Patient Profile Reviewed yes   Onset of Illness/Injury or Date of Surgery 12/19/23   Pertinent History of Current Functional Problem Pt is admitted due to right foot infection   Medical Lines PIV Line;Telemetry;Wound Vac   Respiratory Status room air   Existing Precautions/Restrictions fall precautions   Weight-bearing Status   Extremity Weight-bearing Status right lower extremity   Right Lower Extremity weight-bearing as tolerated (WBAT)  (Tow out shoe)   Mutuality/Individual Preferences   Individualized Care Needs Assist x1 with FWW   Plan of Care Reviewed With patient;spouse   Pre Treatment Status   Pre Treatment Patient Status Patient supine in bed;Call light within reach;Telephone within reach;Patient safety alarm activated;Nurse approved session   Support Present Pre Treatment  Family present   Cognition   Behavior/Mood Observations behavior appropriate to situation, WNL/WFL   Orientation Status oriented x 4   Attention WNL/WFL   Follows Commands WNL   Vital Signs   O2 Delivery Pre Treatment room air   O2 Delivery Post Treatment room air   Pain Assessment   Pretreatment Pain Rating 8/10   Posttreatment Pain Rating 10/10   Pain Location - Side Right   Pain Location foot   Bed Mobility   Supine-Sit Independence modified independence   Sit to Supine, Independence modified independence   Bed Mobility, Assistive Device bed rails   Comment Pt with some difficulty with reaching EOB due to  weakness and pain   Safety Issues decreased use of legs for bridging/pushing;impaired trunk control for bed mobility;decreased use of arms for pushing/pulling   Impairments balance impaired;endurance;pain;postural control impaired;strength decreased   Transfer Assessment/Treatment   Sit-Stand Independence stand-by assistance   Stand-Sit Independence stand-by assistance   Sit-Stand-Sit, Assist Device walker, front wheeled   Transfer Impairments balance  impaired;endurance;postural control impaired;strength decreased;pain   Transfer Comment Pt required verbal ceus for reaching back to sit and pushing up to stand   Gait Assessment/Treatment   Independence  stand-by assistance   Assistive Device  walker, front wheeled   Distance in Feet 300   Deviations  cadence decreased;double stance time increased;step length decreased;toe-to-floor clearance decreased;swing-to-stance ratio decreased   Safety Issues  balance decreased during turns;step length decreased   Impairments  balance impaired;endurance;pain;postural control impaired;strength decreased   Comment Pt required verbal cues for increased step height and length   Balance   Sitting Balance: Static fair + balance   Sitting Balance: Dynamic fair + balance   Sit-to-Stand Balance fair + balance   Standing Balance: Static fair + balance   Standing Balance: Dynamic fair balance   Identified Impairments Contributing to Balance Disturbance pain;impaired postural control;decreased strength   Post Treatment Status   Post Treatment Patient Status Patient supine in bed;Call light within reach;Telephone within reach;Patient safety alarm activated   Support Present Post Treatment  Family present   Communication Post Treatement Nurse   Plan of Care Review   Plan Of Care Reviewed With patient;spouse   Physical Therapy Clinical Impression   Assessment Pt with deficits in strength, balance, endurance, transfer ability, and ambulation quality and endurance. Pt performed bed mobility with MI and use of rail. pt with some difficulty reaching EOB due to weakness and pain. Pt performed transfers with SBA and FWW. Pt requireed verbal cues for reaching back to sit and pushing up to stand. Pt ambulated 300' with SBA and FWW. Pt requried verbal cues for increased step height and length. Pt is not currently functioning at established baseline. PT recommends home with assist and home health services once medically stable for D/C.   Criteria for  Skilled Therapeutic yes;meets criteria   Pathology/Pathophysiology Noted musculoskeletal;neuromuscular;cardiovascular;pulmonary   Impairments Found (describe specific impairments) aerobic capacity/endurance;gait, locomotion, and balance;muscle performance   Functional Limitations in Following  self-care;home management;community/leisure   Disability: Inability to Perform community/leisure   Rehab Potential good   Therapy Frequency minimum of 3x/week   Predicted Duration of Therapy Intervention (days/wks) until discharge   Anticipated Equipment Needs at Discharge (PT) none anticipated   Anticipated Discharge Disposition home with assist;home with home health       Therapist:   Edsel Portela, PT   Pager #: [phone]

## 2023-12-26 NOTE — Care Plan (Signed)
 Problem: Wound  Goal: Optimal Coping  Outcome: Ongoing (see interventions/notes)  Goal: Optimal Functional Ability  Outcome: Ongoing (see interventions/notes)  Intervention: Optimize Functional Ability  Recent Flowsheet Documentation  Taken 12/25/2023 2000 by Lucie RAMAN, LPN  Activity Assistance Provided: assistance, stand-by  Goal: Absence of Infection Signs and Symptoms  Outcome: Ongoing (see interventions/notes)  Intervention: Prevent or Manage Infection  Recent Flowsheet Documentation  Taken 12/25/2023 2122 by Lucie RAMAN, LPN  Fever Reduction/Comfort Measures:   lightweight bedding   lightweight clothing  Goal: Improved Oral Intake  Outcome: Ongoing (see interventions/notes)  Goal: Optimal Pain Control and Function  Outcome: Ongoing (see interventions/notes)  Goal: Skin Health and Integrity  Outcome: Ongoing (see interventions/notes)  Intervention: Optimize Skin Protection  Recent Flowsheet Documentation  Taken 12/25/2023 2122 by Lucie RAMAN, LPN  Pressure Reduction Techniques: Frequent weight shifting encouraged  Pressure Reduction Devices: Repositioning wedges/pillows utilized  Taken 12/25/2023 2000 by Lucie RAMAN, LPN  Pressure Reduction Techniques: Frequent weight shifting encouraged  Pressure Reduction Devices: Repositioning wedges/pillows utilized  Head of Bed (HOB) Positioning: HOB elevated  Goal: Optimal Wound Healing  Outcome: Ongoing (see interventions/notes)  Intervention: Promote Wound Healing  Recent Flowsheet Documentation  Taken 12/25/2023 2122 by Lucie RAMAN, LPN  Pressure Reduction Techniques: Frequent weight shifting encouraged  Pressure Reduction Devices: Repositioning wedges/pillows utilized  Taken 12/25/2023 2000 by Lucie RAMAN, LPN  Pressure Reduction Techniques: Frequent weight shifting encouraged  Pressure Reduction Devices: Repositioning wedges/pillows utilized     Problem: Adult Inpatient Plan of Care  Goal: Absence of Hospital-Acquired Illness or Injury  Outcome: Ongoing (see  interventions/notes)  Intervention: Identify and Manage Fall Risk  Recent Flowsheet Documentation  Taken 12/26/2023 0400 by Lucie RAMAN, LPN  Safety Promotion/Fall Prevention:   activity supervised   fall prevention program maintained   safety round/check completed  Taken 12/26/2023 0000 by Lucie RAMAN, LPN  Safety Promotion/Fall Prevention:   activity supervised   fall prevention program maintained   safety round/check completed  Taken 12/25/2023 2200 by Lucie RAMAN, LPN  Safety Promotion/Fall Prevention:   activity supervised   fall prevention program maintained   safety round/check completed  Taken 12/25/2023 2000 by Lucie RAMAN, LPN  Safety Promotion/Fall Prevention:   activity supervised   fall prevention program maintained   safety round/check completed  Intervention: Prevent Skin Injury  Recent Flowsheet Documentation  Taken 12/25/2023 2000 by Lucie RAMAN, LPN  Body Position: sitting  Intervention: Prevent and Manage VTE (Venous Thromboembolism) Risk  Recent Flowsheet Documentation  Taken 12/25/2023 2122 by Lucie RAMAN, LPN  VTE Prevention/Management: ambulation promoted  Goal: Optimal Comfort and Wellbeing  Outcome: Ongoing (see interventions/notes)  Goal: Rounds/Family Conference  Outcome: Ongoing (see interventions/notes)     Problem: Health Knowledge, Opportunity to Enhance (Adult,Obstetrics,Pediatric)  Goal: Knowledgeable about Health Subject/Topic  Description: Patient will demonstrate the desired outcomes by discharge/transition of care.  Outcome: Ongoing (see interventions/notes)     Problem: Surgery Nonspecified  Goal: Absence of Bleeding  Outcome: Ongoing (see interventions/notes)  Goal: Effective Bowel Elimination  Outcome: Ongoing (see interventions/notes)  Goal: Fluid and Electrolyte Balance  Outcome: Ongoing (see interventions/notes)  Goal: Blood Glucose Level Within Target Range  Outcome: Ongoing (see interventions/notes)  Goal: Absence of Infection Signs and Symptoms  Outcome: Ongoing (see  interventions/notes)  Intervention: Prevent or Manage Infection  Recent Flowsheet Documentation  Taken 12/25/2023 2122 by Lucie RAMAN, LPN  Fever Reduction/Comfort Measures:   lightweight bedding   lightweight clothing  Goal: Anesthesia/Sedation Recovery  Outcome: Ongoing (see interventions/notes)  Intervention: Optimize Anesthesia  Recovery  Recent Flowsheet Documentation  Taken 12/26/2023 0400 by Lucie RAMAN, LPN  Safety Promotion/Fall Prevention:   activity supervised   fall prevention program maintained   safety round/check completed  Taken 12/26/2023 0000 by Lucie RAMAN, LPN  Safety Promotion/Fall Prevention:   activity supervised   fall prevention program maintained   safety round/check completed  Taken 12/25/2023 2200 by Lucie RAMAN, LPN  Safety Promotion/Fall Prevention:   activity supervised   fall prevention program maintained   safety round/check completed  Taken 12/25/2023 2122 by Lucie RAMAN, LPN  Reorientation Measures: clock in view  Taken 12/25/2023 2000 by Lucie RAMAN, LPN  Safety Promotion/Fall Prevention:   activity supervised   fall prevention program maintained   safety round/check completed  Goal: Optimal Pain Control and Function  Outcome: Ongoing (see interventions/notes)  Goal: Nausea and Vomiting Relief  Outcome: Ongoing (see interventions/notes)  Goal: Effective Urinary Elimination  Outcome: Ongoing (see interventions/notes)  Goal: Effective Oxygenation and Ventilation  Outcome: Ongoing (see interventions/notes)  Intervention: Optimize Oxygenation and Ventilation  Recent Flowsheet Documentation  Taken 12/25/2023 2000 by Lucie RAMAN, LPN  Head of Bed [facility]) Positioning: HOB elevated     Problem: Comorbidity Management  Goal: Maintenance of Asthma Control  Outcome: Ongoing (see interventions/notes)  Intervention: Maintain Asthma Symptom Control  Recent Flowsheet Documentation  Taken 12/26/2023 0400 by Lucie RAMAN, LPN  Medication Review/Management: medications reviewed  Taken 12/26/2023 0000 by Lucie RAMAN,  LPN  Medication Review/Management: medications reviewed  Taken 12/25/2023 2200 by Lucie RAMAN, LPN  Medication Review/Management: medications reviewed  Taken 12/25/2023 2000 by Lucie RAMAN, LPN  Medication Review/Management: medications reviewed  Goal: Maintenance of COPD Symptom Control  Outcome: Ongoing (see interventions/notes)  Intervention: Maintain COPD (Chronic Obstructive Pulmonary Disease) Symptom Control  Recent Flowsheet Documentation  Taken 12/26/2023 0400 by Lucie RAMAN, LPN  Medication Review/Management: medications reviewed  Taken 12/26/2023 0000 by Lucie RAMAN, LPN  Medication Review/Management: medications reviewed  Taken 12/25/2023 2200 by Lucie RAMAN, LPN  Medication Review/Management: medications reviewed  Taken 12/25/2023 2000 by Lucie RAMAN, LPN  Medication Review/Management: medications reviewed  Goal: Blood Glucose Level Within Target Range  Outcome: Ongoing (see interventions/notes)  Intervention: Monitor and Manage Glycemia  Recent Flowsheet Documentation  Taken 12/26/2023 0400 by Lucie RAMAN, LPN  Medication Review/Management: medications reviewed  Taken 12/26/2023 0000 by Lucie RAMAN, LPN  Medication Review/Management: medications reviewed  Taken 12/25/2023 2200 by Lucie RAMAN, LPN  Medication Review/Management: medications reviewed  Taken 12/25/2023 2000 by Lucie RAMAN, LPN  Medication Review/Management: medications reviewed  Goal: Blood Pressure in Desired Range  Outcome: Ongoing (see interventions/notes)  Intervention: Maintain Blood Pressure Management  Recent Flowsheet Documentation  Taken 12/26/2023 0400 by Lucie RAMAN, LPN  Medication Review/Management: medications reviewed  Taken 12/26/2023 0000 by Lucie RAMAN, LPN  Medication Review/Management: medications reviewed  Taken 12/25/2023 2200 by Lucie RAMAN, LPN  Medication Review/Management: medications reviewed  Taken 12/25/2023 2000 by Lucie RAMAN, LPN  Medication Review/Management: medications reviewed     Problem: Skin Injury Risk Increased  Goal: Skin  Health and Integrity  Outcome: Ongoing (see interventions/notes)  Intervention: Optimize Skin Protection  Recent Flowsheet Documentation  Taken 12/25/2023 2122 by Lucie RAMAN, LPN  Pressure Reduction Techniques: Frequent weight shifting encouraged  Pressure Reduction Devices: Repositioning wedges/pillows utilized  Taken 12/25/2023 2000 by Lucie RAMAN, LPN  Pressure Reduction Techniques: Frequent weight shifting encouraged  Pressure Reduction Devices: Repositioning wedges/pillows utilized  Head of Bed (HOB) Positioning: HOB elevated

## 2023-12-26 NOTE — Consults (Signed)
 Infectious Diseases  Follow up note          Yvette Schmidt       [id number]       Date of service: 12/26/2023  Date of Admission:  12/19/2023    Hospital Day:  LOS: 7 days     Assessment/Recommendations:   Ms.Yvette Schmidt is a 48 y.o. 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  Patient was ultimately discharged to complete a course of IV dalbavancin.  Operative pathology noted persistent osteomyelitis at the bony margins.  Patient then directly readmitted from vascular surgery clinic due to poor wound healing.  Upon readmission she was taken back to the OR on 12/20/2023 and underwent right foot exploration with further debridement excisional removal of nonviable skin, subcutaneous tissue, fat, fascia, muscle, tendon with washout and open packing of the wounds.     Unfortunately previous operative cultures obtained 12/05/2023 are now also growing Candida glabrata.  Operative cultures obtained this admission on 12/20/2023 are now growing Candida krusei and Candida albicans, susceptibilities pending but both typically sensitive to fluconazole .  Candida glabrata not isolated this admission    Micafungin  discontinued with transition to oral fluconazole  400 mg daily.  Will plan for six-month course given underlying fungal osteomyelitis as additional bone was not resected this admission  Would also continue ciprofloxacin  500 mg b.i.d. and Flagyl  500 mg b.i.d. for planned 6 week course due to osteomyelitis.  End date remains 01/16/2024.    Continue local wound care and wound VAC per vascular surgery Service.    Case discussed with primary service, nursing, and care management.  Patient awaiting possible discharge with home  health and wound VAC pending vascular surgery approval.  Patient can follow up after discharge Infectious Disease Clinic, will attempt to coordinate with vascular surgery follow-up.    Subjective:  Patient seen this morning asleep though easily arousable with husband at bedside.  Extensive discussion had at bedside in regards antimicrobial plan.  Patient anxious for discharge home.  She is now agreeable for home health.      Review of systems:  Remainder of review of systems negative unless indicated above.     Objective:   Filed Vitals:    12/26/23 0040 12/26/23 0519 12/26/23 0800 12/26/23 1151   BP:  (!) 131/102 (!) 145/95    Pulse:  (!) 101 (!) 103    Resp: 20 16 20 18    Temp:  36.6 C (97.9 F) 36.6 C (97.9 F)    SpO2:  94% 93%          Intake/Output Summary (Last 24 hours) at 12/26/2023 1254  Last data filed at 12/26/2023 1200  Gross per 24 hour   Intake 2002.17 ml   Output 3200 ml   Net -1197.83 ml       I have reviewed the vitals.    General:  Awake alert sitting up in bed.  Husband at bedside.  CV: RRR   Respiratory:  Breathing comfortably, no tachypnea or increased work of breathing  Extremities:  Wound VAC in place  Neurological:  Awake alert and answering questions appropriately      Labs:   CBC with Diff (Last 24 Hours):    Recent Results last 24 hours  12/26/23  0611   WBC 9.2   HGB 15.7   HCT 47.4*   MCV 92.9   PLTCNT 212   PMNS 56.4   LYMPHO 31.2   MONOCYTES 6.7   EOSINO 4.8   BASOPHILS 0.7  <0.10       BMP (Last 24 Hours):    Recent Results last 24 hours     12/26/23  0611   SODIUM 135*   POTASSIUM 4.3   CHLORIDE 100   CO2 26   BUN 20   CREATININE 0.74   CALCIUM 8.9       I have reviewed labs.        Current Medications[1]    Micro: No results found for any visits on 12/19/23 (from the past 96 hours).    Radiology:    Results for orders placed or performed during the hospital encounter of 12/19/23 (from the past 72 hours)   XR KNEE LEFT 2 VIEW     Status: None    Narrative    MRS. Yvette D  Schmidt    PROCEDURE DESCRIPTION: XR KNEE LEFT 2 VIEWS.  VIEWS: 2.  PROCEDURE DATE AND TIME: 12/23/2023 1:28 PM.  CLINICAL INDICATION: Left knee pain - hx of meniscus injury.  COMPARISON: Correlated with the LEFT leg radiographs dated 09/04/2006.    FINDINGS:   No acute fracture. No dislocation. No joint effusion. There is tricompartment loss of joint space and subchondral eburnation. There is spurring of tibial spines. There is osteophyte formation around all 3 compartments. Joint space loss is most advanced in the medial compartment.       Impression    1. Moderate tricompartmental DJD.   2. No acute bony abnormality is identified.       Radiologist location ID: [id number]              [facility], [place]      Patient was not seen or examined today, I was available for and reviewed the patient's case and recommendations with H. Summerfield, PA-C. I have reviewed the consult note and agree with the findings and plan of care as documented.  Any exceptions/additions have been edited/noted above.  Case has been reviewed with primary service and vascular surgery, ultimately patient will need to sign out against medical advice as patient has opted not to proceed with overall plan of care that was agreed upon between patient and vascular surgery prior to admission.  Will see the patient back in clinic in attempt to coordinate clinic visit same-day as vascular surgery.      Dorn Bars, DO  [facility] Infectious Diseases           [1]   Current Facility-Administered Medications:     acetaminophen  (TYLENOL ) tablet, 650 mg, Oral, Q6H PRN, Zegeer, Joshua, MD, 650 mg at 12/23/23 0915    albuterol  (PROVENTIL ) 2.5 mg / 3 mL (0.083%) neb solution, 2.5 mg, Nebulization, Q6H PRN, Jann Maria, MD    budesonide  (PULMICORT  RESPULES) 0.5 mg/2 mL nebulizer suspension, 0.5 mg, Nebulization, 2x/day, 0.5 mg at 12/26/23 9347 **AND** arformoterol  (BROVANA ) 15 mcg/2 mL nebulizer solution, 15 mcg, Nebulization, 2x/day, Jann Maria, MD, 15 mcg at 12/26/23 9347    aspirin  chewable tablet 81 mg, 81 mg, Oral, Daily, Jann Maria, MD, 81 mg at 12/26/23 9078    azelastine  (ASTELIN ) 0.1% nasal spray, 1 Spray, Each Nostril, 2x/day, Janita Hilarie Nakai, MD, 1 Spray at 12/26/23 0932    cetirizine  (zyrTEC ) tablet, 10 mg, Oral, Daily, Villarreal  Hilarie Nakai, MD, 10 mg at 12/26/23 0920    ciprofloxacin  (CIPRO ) tablet, 500 mg, Oral, 2x/day, Jann Maria, MD, 500 mg at 12/26/23 9078    clonazePAM  (klonoPIN ) tablet, 0.5 mg, Oral, 3x/day, Villarreal Aguirre, Leonardo, MD, 0.5 mg at 12/26/23 9078    Correction/SSIP insulin  lispro 100 units/mL injection, 4-18 Units, Subcutaneous, 4x/day AC, Villarreal Aguirre, Nakai, MD, 18 Units at 12/26/23 1146    D5W 250 mL flush bag, , Intravenous, Q15 Min PRN, Jann Maria, MD    dextrose  (GLUTOSE) 40% oral gel, 15 g, Oral, Q15 Min PRN, Jann Maria, MD    dextrose  50% (0.5 g/mL) injection - syringe, 12.5 g, Intravenous, Q15 Min PRN, Jann Maria, MD    fluconazole  (DIFLUCAN ) tablet, 400 mg, Oral, Daily, Summerfield, Haley May, PA-C    fluticasone  (FLONASE ) 50 mcg per spray nasal spray, 1 Spray, Each Nostril, 2x/day PRN, Jann Maria, MD, 1 Spray at 12/26/23 9076    furosemide  (LASIX ) tablet, 40 mg, Oral, Daily, Jann Maria, MD, 40 mg at 12/25/23 1352    gabapentin  (NEURONTIN ) capsule, 400 mg, Oral, 2x/day, Jann Maria, MD, 400 mg at 12/26/23 9077    glucagon  (GLUCAGEN) injection 1 mg, 1 mg, IntraMUSCULAR, Once PRN, Jann Maria, MD    heparin  10,000 units/mL injection, 5,000 Units, Subcutaneous, Q8HRS, Jann Maria, MD    insulin  glargine 100 units/mL injection, 75 Units, Subcutaneous, 2x/day, Villarreal Aguirre, Nakai, MD, 75 Units at 12/26/23 9075    insulin  lispro 100 units/mL injection, 13 Units, Subcutaneous, 3x/day AC, Villarreal Aguirre, Leonardo, MD, 13 Units at 12/26/23 1145    ipratropium (ATROVENT ) 0.02% nebulizer  solution, 0.25 mg, Nebulization, Q6H, Jann Maria, MD, 0.25 mg at 12/26/23 9347    ketorolac  (TORADOL ) 15 mg/mL injection, 15 mg, Intravenous, Q6H PRN, Villarreal Aguirre, Leonardo, MD, 15 mg at 12/25/23 0009    lactulose  (ENULOSE ) 10g per 15mL oral liquid, 30 mL, Oral, Daily, Jann Maria, MD    linaclotide  (LINZESS ) capsule, 290 mcg, Oral, QAM, Estrada, Alejandro, MD, 290 mcg at 12/26/23 9352    metroNIDAZOLE  (FLAGYL ) tablet, 500 mg, Oral, 2x/day, Jann Maria, MD, 500 mg at 12/26/23 9078    nicotine  (NICODERM CQ ) transdermal patch (mg/24 hr), 21 mg, Transdermal, Q24H, Villarreal Aguirre, Leonardo, MD, Patch Removed at 12/26/23 1159    nicotine  polacrilex (COMMIT) lozenge, 4 mg, Oral, Q1H PRN, Jann Maria, MD, 4 mg at 12/21/23 1520    NS 250 mL flush bag, , Intravenous, Q15 Min PRN, Jann Maria, MD    NS flush syringe, 3 mL, Intracatheter, Q8HRS, Jann Maria, MD, 3 mL at 12/25/23 1400    NS flush syringe, 3 mL, Intracatheter, Q1H PRN, Jann Maria, MD    ondansetron  (ZOFRAN ) 2 mg/mL injection, 4 mg, Intravenous, Q8H PRN, Zegeer, Joshua, MD, 4 mg at 12/21/23 2207    oxyCODONE  (ROXICODONE ) immediate release tablet, 10 mg, Oral, Q4H PRN, Villarreal Aguirre, Leonardo, MD, 10 mg at 12/26/23 9348    pantoprazole  (PROTONIX ) delayed release tablet, 40 mg, Oral, 2x/day AC, Villarreal Aguirre, Leonardo, MD, 40 mg at 12/26/23 0700    phenol (CHLORASEPTIC) 1.4% oromucosal spray, 1 Spray, Mouth/Throat, Q4H PRN, Jann Maria, MD, 1 Spray at 12/21/23 1257    rosuvastatin  (CRESTOR ) tablet, 20 mg, Oral, QPM, Jann Maria, MD, 20 mg at 12/25/23 2127    sennosides-docusate sodium  (SENOKOT-S) 8.6-50mg  per tablet, 1 Tablet, Oral, HS PRN, Jann Maria, MD    sucralfate  (CARAFATE ) tablet, 1 g, Oral, Q6HRS, Cutright, [name], APRN, CNP, 1 g at 12/25/23 1204    traMADol  (ULTRAM )  tablet, 50 mg, Oral, Q4H PRN, Villarreal Aguirre, Leonardo, MD, 50 mg at 12/26/23 1051

## 2023-12-26 NOTE — Nurses Notes (Signed)
 Patient alert and oriented. Wound Vac is intact and operating as intended. VSS. O2 source, RA. Patient free of falls this shift.     BP (!) 145/95   Pulse (!) 103   Temp 36.6 C (97.9 F)   Resp 18   Ht 1.651 m (5' 5)   Wt (!) 138 kg (303 lb 2.1 oz)   SpO2 93%   BMI 50.44 kg/m     Currently this patient meets requirements for a low to mid level of nursing care. The patient will continue to be monitored and re-evaluated for any changes.     -Nat Alt, LPN

## 2023-12-26 NOTE — Care Plan (Signed)
 Problem: Wound  Goal: Optimal Coping  Outcome: Ongoing (see interventions/notes)  Goal: Optimal Functional Ability  Outcome: Ongoing (see interventions/notes)  Goal: Absence of Infection Signs and Symptoms  Outcome: Ongoing (see interventions/notes)  Goal: Improved Oral Intake  Outcome: Ongoing (see interventions/notes)  Goal: Optimal Pain Control and Function  Outcome: Ongoing (see interventions/notes)  Goal: Skin Health and Integrity  Outcome: Ongoing (see interventions/notes)  Goal: Optimal Wound Healing  Outcome: Ongoing (see interventions/notes)     Problem: Adult Inpatient Plan of Care  Goal: Absence of Hospital-Acquired Illness or Injury  Outcome: Ongoing (see interventions/notes)  Intervention: Identify and Manage Fall Risk  Recent Flowsheet Documentation  Taken 12/26/2023 1000 by [facility]  Safety Promotion/Fall Prevention:   safety round/check completed   fall prevention program maintained  Taken 12/26/2023 0800 by Levon  Safety Promotion/Fall Prevention:   safety round/check completed   fall prevention program maintained  Intervention: Prevent Skin Injury  Recent Flowsheet Documentation  Taken 12/26/2023 1000 by Levon  Body Position: supine, head elevated  Taken 12/26/2023 0800 by Levon  Body Position: supine, head elevated  Intervention: Prevent and Manage VTE (Venous Thromboembolism) Risk  Recent Flowsheet Documentation  Taken 12/26/2023 0800 by Levon  VTE Prevention/Management: ambulation promoted  Goal: Optimal Comfort and Wellbeing  Outcome: Ongoing (see interventions/notes)  Goal: Rounds/Family Conference  Outcome: Ongoing (see interventions/notes)     Problem: Health Knowledge, Opportunity to Enhance (Adult,Obstetrics,Pediatric)  Goal: Knowledgeable about Health Subject/Topic  Description: Patient will demonstrate the desired outcomes by discharge/transition of care.  Outcome: Ongoing (see interventions/notes)     Problem: Surgery Nonspecified  Goal: Absence of Bleeding  Outcome: Ongoing (see  interventions/notes)  Goal: Effective Bowel Elimination  Outcome: Ongoing (see interventions/notes)  Intervention: Enhance Bowel Motility and Elimination  Recent Flowsheet Documentation  Taken 12/26/2023 0800 by Levon  Bowel Elimination Management: relaxation techniques promoted  Goal: Fluid and Electrolyte Balance  Outcome: Ongoing (see interventions/notes)  Goal: Blood Glucose Level Within Target Range  Outcome: Ongoing (see interventions/notes)  Goal: Absence of Infection Signs and Symptoms  Outcome: Ongoing (see interventions/notes)  Goal: Anesthesia/Sedation Recovery  Outcome: Ongoing (see interventions/notes)  Intervention: Optimize Anesthesia Recovery  Recent Flowsheet Documentation  Taken 12/26/2023 1000 by [facility]  Safety Promotion/Fall Prevention:   safety round/check completed   fall prevention program maintained  Taken 12/26/2023 0800 by Levon  Safety Promotion/Fall Prevention:   safety round/check completed   fall prevention program maintained  Reorientation Measures:   clock in view   calendar in view  Goal: Optimal Pain Control and Function  Outcome: Ongoing (see interventions/notes)  Goal: Nausea and Vomiting Relief  Outcome: Ongoing (see interventions/notes)  Goal: Effective Urinary Elimination  Outcome: Ongoing (see interventions/notes)  Goal: Effective Oxygenation and Ventilation  Outcome: Ongoing (see interventions/notes)  Intervention: Optimize Oxygenation and Ventilation  Recent Flowsheet Documentation  Taken 12/26/2023 0800 by Levon  Airway/Ventilation Management: position adjusted     Problem: Comorbidity Management  Goal: Maintenance of Asthma Control  Outcome: Ongoing (see interventions/notes)  Goal: Maintenance of COPD Symptom Control  Outcome: Ongoing (see interventions/notes)  Intervention: Maintain COPD (Chronic Obstructive Pulmonary Disease) Symptom Control  Recent Flowsheet Documentation  Taken 12/26/2023 0800 by Levon  Breathing Techniques/Airway Clearance: deep/controlled cough  encouraged  Goal: Blood Glucose Level Within Target Range  Outcome: Ongoing (see interventions/notes)  Goal: Blood Pressure in Desired Range  Outcome: Ongoing (see interventions/notes)     Problem: Skin Injury Risk Increased  Goal: Skin Health and Integrity  Outcome: Ongoing (see interventions/notes)

## 2023-12-26 NOTE — Care Management Notes (Signed)
 [facility]  Care Management Note    Patient Name: Yvette Schmidt  Date of Birth: 21-May-1975  Sex: female  Date/Time of Admission: 12/19/2023  4:25 PM  Room/Bed: 4108/A  Payor: HEALTH PLAN MEDICAID / Plan: HEALTH PLAN MEDICAID / Product Type: Medicaid MC /    LOS: 7 days   Primary Care Providers:  Avelino Ripley Pop, MD, MD (General)    Admitting Diagnosis:  Right foot infection [L08.9]    Assessment:   CM met with patient, her husband and sister to discuss discharge plans and goals. After discussion patient is agreeable to have home health services at time of discharge for wound vac care. Patient had concerns with home health being able to access her house. Patient and her husband do not drive and do not have a driveway to their house. Their trailer is behind her mother's house. Patients mother is agreeable to have home health come to her house to complete wound vac changes. Address for her house is [address], [place], [place] [address].    CM Provided patient CarePort listing with CMS ratings for Home Health serving the [phone] zip code. The list provided is available in CarePort. FOC completed for [facility]. HH order pended. Discharge AVS updated with [facility] information. FOC uploaded to [facility] and referral sent.     CM submitted wound vac order via [employer] [employer] for home wound vac.    Per discussion with Darryle Dadds, PA patient will be discharging on PO antibiotics/antifungals.     Discharge Plan:  Home with Home Health and DME (code 6)  [facility]  [facility] [employer] - Wound Vac    The patient will continue to be evaluated for developing discharge needs.     Case Manager: Charmaine Prima, RN  Phone: [phone]

## 2023-12-26 NOTE — Nurses Notes (Signed)
 Patient is alert and oriented. VS stable, on room air. IV is clean, dry, and intact. Wound vac in place. Patient has had complaints of pain this shift, PRN medication given and tolerated. No other complaints at this time. Will continue to monitor for any changes. Patient is in bed with HOB elevated, wheels locked, call light in reach, family at bedside.    BP (!) 143/101   Pulse (!) 102   Temp 37.1 C (98.8 F)   Resp 20   Ht 1.651 m (5' 5)   Wt (!) 138 kg (303 lb 2.1 oz)   SpO2 94%   BMI 50.44 kg/m     [employer], LPN

## 2023-12-26 NOTE — Care Plan (Signed)
 I have reviewed this patient's orders and plan of care.  Currently this patient meets requirements for a low to mid level of nursing care.     Lorre Munroe, RN

## 2023-12-26 NOTE — Care Management Notes (Signed)
 [facility]  Care Management Note    Patient Name: Yvette Schmidt  Date of Birth: 1975-06-17  Sex: female  Date/Time of Admission: 12/19/2023  4:25 PM  Room/Bed: 4108/A  Payor: HEALTH PLAN MEDICAID / Plan: HEALTH PLAN MEDICAID / Product Type: Medicaid MC /    LOS: 7 days   Primary Care Providers:  Avelino Ripley Pop, MD, MD (General)    Admitting Diagnosis:  Right foot infection [L08.9]    Assessment:   CM received update from Jordan Kesler, APRN that patient will have to sign out AMA at time of discharge. Per Dr. Adeniyi, she can be discharged but will have to sign out AMA as she is going against medical advice not going to a SNF. Dr. Reesa office will manage [facility] order. Patient will need to be seen in the office on a Monday or Tuesday morning with Dr. Adeniyi within 2 weeks. She will need to coordinate with Dr. Vikki office.    Infectious Dx has confirmed antibiotics/ antifungals PO for patient.     CM sent a secure chat to Katheryn Staples, with [facility] to check on home health for a patient who is discharging AMA. Per Katheryn, she is sending this to the office to be reviewed.    CM discussed discharge plans with patient. CM explained that Dr. Adeniyi  is recommending  patient to discharge to a SNF at time of discharge. Patient is refusing.     Patients home wound vac has been approved with [facility] [employer].    CM updated Ellouise Clay, Director of Case Management about patients Plan of Care.    CM called and update Dr. Janita.    Discharge Plan:  Undetermined at this time    The patient will continue to be evaluated for developing discharge needs.     Case Manager: Charmaine Prima, RN  Phone: [phone]

## 2023-12-26 NOTE — Care Plan (Signed)
 Currently this patient meets requirements for a low to mid level of nursing care.  The patient will continue to be monitored and re-evaluated for any changes.  Madelin Headings, RN

## 2023-12-26 NOTE — Nurses Notes (Addendum)
 Discussed with patient , husband  who were present in room and sister  Luke  via  phone conversation , the recommended plan of care per Vascular Surgery and Infectious Disease as  previously discussed on 12-25-23. Is for the patient to go to a SNF to receive IV antibiotic, anti fungals and wound care via a wound vac.  Patient adamantly declines going to a skilled nursing facility, and repeatedly stated  she wants to go home  Patient stated,  she wants to  sign out AMA and go elsewhere.  Patient and family members updated by nursing and case management that discharge plans for home health and wound  vac approval are in process., and the antibiotic and antifungal oral medication plan has been finalized by infectious disease.  Nursing informed patient and family that nursing and case management will keep them updated.Steva Blake, RN      16:38:  Patient and sister updated that per case management the wound vac has been approved and we are awaiting acceptance from [facility].Steva Blake, RN

## 2023-12-27 ENCOUNTER — Other Ambulatory Visit: Payer: Self-pay

## 2023-12-27 ENCOUNTER — Other Ambulatory Visit (INDEPENDENT_AMBULATORY_CARE_PROVIDER_SITE_OTHER): Payer: Self-pay | Admitting: Internal Medicine

## 2023-12-27 ENCOUNTER — Non-Acute Institutional Stay: Payer: MEDICAID

## 2023-12-27 DIAGNOSIS — Z7951 Long term (current) use of inhaled steroids: Secondary | ICD-10-CM

## 2023-12-27 DIAGNOSIS — Z022 Encounter for examination for admission to residential institution: Secondary | ICD-10-CM

## 2023-12-27 DIAGNOSIS — J45909 Unspecified asthma, uncomplicated: Secondary | ICD-10-CM

## 2023-12-27 DIAGNOSIS — E11621 Type 2 diabetes mellitus with foot ulcer: Secondary | ICD-10-CM

## 2023-12-27 LAB — POC BLOOD GLUCOSE (RESULTS)
GLUCOSE, POC: 380 mg/dL — ABNORMAL HIGH (ref 70–110)
GLUCOSE, POC: 336 mg/dL — ABNORMAL HIGH (ref 70–110)
GLUCOSE, POC: 276 mg/dL — ABNORMAL HIGH (ref 70–110)

## 2023-12-27 LAB — TISSUE CULTURE (AEROBIC CULT & GRAM STAIN)

## 2023-12-27 NOTE — Nurses Notes (Signed)
 Patient discharged home with family.  AVS reviewed with patient/care giver.  A written copy of the AVS and discharge instructions was given to the patient/care giver.  Questions sufficiently answered as needed.  Patient/care giver encouraged to follow up with PCP as indicated.  In the event of an emergency, patient/care giver instructed to call 911 or go to the nearest emergency room. Spouse at bedside, patient frustrated and stated she has been taking the antibiotics for a month and it is not working, she also does not smoke and does not need the smoking cessation information.    Olam KATHEE Kiang, RN

## 2023-12-27 NOTE — Care Plan (Signed)
 Problem: Wound  Goal: Optimal Coping  Outcome: Ongoing (see interventions/notes)  Goal: Optimal Functional Ability  Outcome: Ongoing (see interventions/notes)  Goal: Absence of Infection Signs and Symptoms  Outcome: Ongoing (see interventions/notes)  Intervention: Prevent or Manage Infection  Recent Flowsheet Documentation  Taken 12/27/2023 0100 by Dorlene B, LPN  Fever Reduction/Comfort Measures:   lightweight bedding   lightweight clothing  Taken 12/26/2023 2102 by Dorlene B, LPN  Fever Reduction/Comfort Measures:   lightweight bedding   lightweight clothing  Goal: Improved Oral Intake  Outcome: Ongoing (see interventions/notes)  Goal: Optimal Pain Control and Function  Outcome: Ongoing (see interventions/notes)  Goal: Skin Health and Integrity  Outcome: Ongoing (see interventions/notes)  Intervention: Optimize Skin Protection  Recent Flowsheet Documentation  Taken 12/27/2023 0100 by Dorlene B, LPN  Pressure Reduction Techniques: Frequent weight shifting encouraged  Pressure Reduction Devices: Repositioning wedges/pillows utilized  Taken 12/26/2023 2102 by Dorlene B, LPN  Pressure Reduction Techniques: Frequent weight shifting encouraged  Pressure Reduction Devices: Repositioning wedges/pillows utilized  Goal: Optimal Wound Healing  Outcome: Ongoing (see interventions/notes)  Intervention: Promote Wound Healing  Recent Flowsheet Documentation  Taken 12/27/2023 0100 by Dorlene B, LPN  Pressure Reduction Techniques: Frequent weight shifting encouraged  Pressure Reduction Devices: Repositioning wedges/pillows utilized  Taken 12/26/2023 2102 by Dorlene B, LPN  Pressure Reduction Techniques: Frequent weight shifting encouraged  Pressure Reduction Devices: Repositioning wedges/pillows utilized     Problem: Adult Inpatient Plan of Care  Goal: Absence of Hospital-Acquired Illness or Injury  Outcome: Ongoing (see interventions/notes)  Intervention: Prevent Skin Injury  Recent Flowsheet Documentation  Taken 12/27/2023 0100 by Dorlene  B, LPN  Skin Protection: adhesive use limited  Taken 12/26/2023 2102 by Dorlene NOVAK, LPN  Skin Protection: adhesive use limited  Intervention: Prevent and Manage VTE (Venous Thromboembolism) Risk  Recent Flowsheet Documentation  Taken 12/27/2023 0100 by Dorlene B, LPN  VTE Prevention/Management: anticoagulant therapy maintained  Taken 12/26/2023 2102 by Dorlene NOVAK, LPN  VTE Prevention/Management: anticoagulant therapy maintained  Goal: Optimal Comfort and Wellbeing  Outcome: Ongoing (see interventions/notes)  Goal: Rounds/Family Conference  Outcome: Ongoing (see interventions/notes)     Problem: Health Knowledge, Opportunity to Enhance (Adult,Obstetrics,Pediatric)  Goal: Knowledgeable about Health Subject/Topic  Description: Patient will demonstrate the desired outcomes by discharge/transition of care.  Outcome: Ongoing (see interventions/notes)     Problem: Surgery Nonspecified  Goal: Absence of Bleeding  Outcome: Ongoing (see interventions/notes)  Goal: Effective Bowel Elimination  Outcome: Ongoing (see interventions/notes)  Goal: Fluid and Electrolyte Balance  Outcome: Ongoing (see interventions/notes)  Goal: Blood Glucose Level Within Target Range  Outcome: Ongoing (see interventions/notes)  Goal: Absence of Infection Signs and Symptoms  Outcome: Ongoing (see interventions/notes)  Intervention: Prevent or Manage Infection  Recent Flowsheet Documentation  Taken 12/27/2023 0100 by Dorlene B, LPN  Fever Reduction/Comfort Measures:   lightweight bedding   lightweight clothing  Taken 12/26/2023 2102 by Dorlene B, LPN  Fever Reduction/Comfort Measures:   lightweight bedding   lightweight clothing  Goal: Anesthesia/Sedation Recovery  Outcome: Ongoing (see interventions/notes)  Goal: Optimal Pain Control and Function  Outcome: Ongoing (see interventions/notes)  Goal: Nausea and Vomiting Relief  Outcome: Ongoing (see interventions/notes)  Goal: Effective Urinary Elimination  Outcome: Ongoing (see interventions/notes)  Goal: Effective  Oxygenation and Ventilation  Outcome: Ongoing (see interventions/notes)     Problem: Comorbidity Management  Goal: Maintenance of Asthma Control  Outcome: Ongoing (see interventions/notes)  Goal: Maintenance of COPD Symptom Control  Outcome: Ongoing (see interventions/notes)  Goal: Blood Glucose Level Within Target Range  Outcome: Ongoing (see interventions/notes)  Goal: Blood Pressure in Desired Range  Outcome: Ongoing (see interventions/notes)     Problem: Skin Injury Risk Increased  Goal: Skin Health and Integrity  Outcome: Ongoing (see interventions/notes)  Intervention: Optimize Skin Protection  Recent Flowsheet Documentation  Taken 12/27/2023 0100 by Dorlene B, LPN  Pressure Reduction Techniques: Frequent weight shifting encouraged  Pressure Reduction Devices: Repositioning wedges/pillows utilized  Skin Protection: adhesive use limited  Taken 12/26/2023 2102 by Dorlene B, LPN  Pressure Reduction Techniques: Frequent weight shifting encouraged  Pressure Reduction Devices: Repositioning wedges/pillows utilized  Skin Protection: adhesive use limited

## 2023-12-27 NOTE — Nursing Note (Signed)
 Case conference scheduled

## 2023-12-27 NOTE — Progress Notes (Signed)
 [facility]  Medicine Progress Note  FULL CODE: ATTEMPT RESUSCITATION/CPR    Yvette Schmidt  Date of service: 12/27/2023    LOS: 8   Subjective: No acute events overnight. Plan to discharge AMA as per patient preferences as she is unwilling to discharge to SNF for further IV microbiotic therapies as recommended by vascular surgery team. Case discussed extensively with vascular, ID, RN and CM. Plan to discharge with PO abx, anti-fungal and wound vac once confirmed HH can accept patient.    Filed Vitals:    12/27/23 0449 12/27/23 0825 12/27/23 0907 12/27/23 1223   BP: (!) 131/103 (!) 162/94     Pulse: 97 99     Resp: 16 18 18 18    Temp: 36.7 C (98.1 F) 36.8 C (98.2 F)     SpO2: 94% 94%       Oxygen Device  SpO2: 94 %  $ O2 Delivery: None (Room Air)  Flow (L/min) (Oxygen Therapy): 6    I have reviewed the vitals.    Input/Output    Intake/Output Summary (Last 24 hours) at 12/27/2023 1619  Last data filed at 12/27/2023 1500  Gross per 24 hour   Intake 1040 ml   Output 3600 ml   Net -2560 ml       acetaminophen  (TYLENOL ) tablet, 650 mg, Oral, Q6H PRN  albuterol  (PROVENTIL ) 2.5 mg / 3 mL (0.083%) neb solution, 2.5 mg, Nebulization, Q6H PRN  budesonide  (PULMICORT  RESPULES) 0.5 mg/2 mL nebulizer suspension, 0.5 mg, Nebulization, 2x/day   And  arformoterol  (BROVANA ) 15 mcg/2 mL nebulizer solution, 15 mcg, Nebulization, 2x/day  aspirin  chewable tablet 81 mg, 81 mg, Oral, Daily  azelastine  (ASTELIN ) 0.1% nasal spray, 1 Spray, Each Nostril, 2x/day  cetirizine  (zyrTEC ) tablet, 10 mg, Oral, Daily  ciprofloxacin  (CIPRO ) tablet, 500 mg, Oral, 2x/day  clonazePAM  (klonoPIN ) tablet, 0.5 mg, Oral, 3x/day  Correction/SSIP insulin  lispro 100 units/mL injection, 4-18 Units, Subcutaneous, 4x/day AC  D5W 250 mL flush bag, , Intravenous, Q15 Min PRN  dextrose  (GLUTOSE) 40% oral gel, 15 g, Oral, Q15 Min PRN  dextrose  50% (0.5 g/mL) injection - syringe, 12.5 g, Intravenous, Q15 Min PRN  fluconazole  (DIFLUCAN ) tablet, 400 mg, Oral,  Daily  fluticasone  (FLONASE ) 50 mcg per spray nasal spray, 1 Spray, Each Nostril, 2x/day PRN  furosemide  (LASIX ) tablet, 40 mg, Oral, Daily  gabapentin  (NEURONTIN ) capsule, 400 mg, Oral, 2x/day  glucagon  (GLUCAGEN) injection 1 mg, 1 mg, IntraMUSCULAR, Once PRN  heparin  10,000 units/mL injection, 5,000 Units, Subcutaneous, Q8HRS  insulin  glargine 100 units/mL injection, 75 Units, Subcutaneous, 2x/day  insulin  lispro 100 units/mL injection, 13 Units, Subcutaneous, 3x/day AC  ipratropium (ATROVENT ) 0.02% nebulizer solution, 0.25 mg, Nebulization, Q6H  lactulose  (ENULOSE ) 10g per 15mL oral liquid, 30 mL, Oral, Daily  linaclotide  (LINZESS ) capsule, 290 mcg, Oral, QAM  metroNIDAZOLE  (FLAGYL ) tablet, 500 mg, Oral, 2x/day  nicotine  (NICODERM CQ ) transdermal patch (mg/24 hr), 21 mg, Transdermal, Q24H  nicotine  polacrilex (COMMIT) lozenge, 4 mg, Oral, Q1H PRN  NS 250 mL flush bag, , Intravenous, Q15 Min PRN  NS flush syringe, 3 mL, Intracatheter, Q8HRS  NS flush syringe, 3 mL, Intracatheter, Q1H PRN  ondansetron  (ZOFRAN ) 2 mg/mL injection, 4 mg, Intravenous, Q8H PRN  oxyCODONE  (ROXICODONE ) immediate release tablet, 10 mg, Oral, Q4H PRN  pantoprazole  (PROTONIX ) delayed release tablet, 40 mg, Oral, 2x/day AC  phenol (CHLORASEPTIC) 1.4% oromucosal spray, 1 Spray, Mouth/Throat, Q4H PRN  rosuvastatin  (CRESTOR ) tablet, 20 mg, Oral, QPM  sennosides-docusate sodium  (SENOKOT-S) 8.6-50mg  per tablet, 1  Tablet, Oral, HS PRN  sucralfate  (CARAFATE ) tablet, 1 g, Oral, Q6HRS  traMADol  (ULTRAM ) tablet, 50 mg, Oral, Q4H PRN         Physical Exam:  General: No acute distress   Cardiac:  Regular rate  Respiratory:  No increased work of breathing  Abdomen: Positive bowel sounds, soft, nontender  Extremities:  Right lower extremity wound VAC in place.    CBC (Last 24 Hours):    No results for input(s): WBC, HGB, HCT, MCV, PLTCNT in the last 24 hours.        BMP (Last 24 Hours):    No results for input(s): SODIUM, POTASSIUM,  CHLORIDE, CO2, BUN, CREATININE, CALCIUM, GLUCOSENF in the last 24 hours.        I have reviewed all labs.    Micro:   No results found for any visits on 12/19/23 (from the past 96 hours).      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 Rod on 11/26.  -the patient received Dalvance  on 11/25 no need to cover Gram-positive coverage at this time  -Tissue culture from 12/05/23 with growth of candida glabrata. OR cultures from 11/26 with rare yeast, pending speciation.  Plan:  -- vascular consulted, status post OR, wound VAC now in place.  -- Infectious disease consulted, following.  -- Continue oral antibiotics with ciprofloxacin  and Flagyl  for planned 6 week course for osteomyelitis. EOT 01/16/24.  -- Initiated on Fluconazole  to complete 6 month course for fungal osteomyelitis given growth of candida krusei and candida albicans as both of these are typically sensitive to fluconazole .   -- Pain management.     History of cardiac diastolic dysfunction  Questionable CAD  -monitor IO as patient appears euvolemic  -continue aspirin   -continue Lasix      Uncontrolled type 2 diabetes mellitus complicated by peripheral neuropathy  -sugars on admission over 400  -glargine increased to 80  -added lispro t.i.d. 10 units  -SSI aggressive  -adjust insulin  regimen as necessary   -continue gabapentin  b.i.d.     Chronic Obstructive Pulmonary Disease  -continue Pulmicort  and Brovana      Hypertension  -continue Lasix    -may benefit from Ace Arb given diabetic but blood pressure is within normal limits, may benefit from low-dose on discharge     Anxiety  -Klonopin       Tobacco use disorder   -nicotine  patch  p.r.n.   -nicotine  lozenge     History of constipation   -continue Linzess , lactulose      gastroesophageal reflux disease   -Protonix             ___    DVT/PE Prophylaxis - Heparin   Consults - ID and vascular surgery  Diet - regular, low calorie count  Disposition Planning - Home discharge     Rupal Childress Janita Bastos, MD    This note may have been partially generated using M[employer] Fluency Direct system, and there may be some incorrect words, spellings, and punctuation that were not noted in checking the note before saving.

## 2023-12-27 NOTE — Nurses Notes (Signed)
I have reviewed this patient's orders and plan of care.  Currently this patient meets requirements for a low to mid level of nursing care.    Teola Felipe, RN

## 2023-12-27 NOTE — Care Management Notes (Addendum)
 [facility]  Care Management Note    Patient Name: Yvette Schmidt  Date of Birth: 1975/05/19  Sex: female  Date/Time of Admission: 12/19/2023  4:25 PM  Room/Bed: 4108/A  Payor: HEALTH PLAN MEDICAID / Plan: HEALTH PLAN MEDICAID / Product Type: Medicaid MC /    LOS: 8 days   Primary Care Providers:  Avelino Ripley Pop, MD, MD (General)    Admitting Diagnosis:  Right foot infection [L08.9]    Assessment:   CM received an update from Mitzie Holmes, RN with [facility]. Per Mitzie, We can accept the patient as long as Dr. Reesa office is still willing to sign home health orders.     CM sent a secure chat to Dr. Rod, Jordan Kesler, NP, Dr. Janita and Darryle Dadds, PA to update them.    CM received a message back from Jordan Kesler, NP. Per Jordan, Yes we will be signing the home health orders.    CM updated patient. Patient understands she will be discharging AMA as this is against Dr. Reesa medical advice. Patient is agreeable. CM also spoke to patients sister, who confirmed she will be able to provide transportation home for patient this afternoon.    Kim, patients sister, did have concerns with antifungals that are ordered at time of discharge. I'll discuss with ID once they round today.    12/27/2023 11:00 CM spoke with Darryle, [place] with ID regarding patient and her families concern regarding antibiotics/antifungals. Per Darryle, Diflucan  will cover both stands that patient is growing.      12/27/2023 12:50 CM delivered patients wound vac. POD signed and uploaded to [facility] [employer].    12/27/2023 16:18 Patient and her family voiced concerns with T,Th,S wound vac schedule. CM discussed with [facility] - Chelsea, RN. A M-W-F schedule would work better for patient and her family. CM asked [facility] if they could accommodate that schedule. Nursing staff will change the wound vac dressing today so next needed vac change will be on Friday. Chelsea, RN with [facility] confirmed  they can start patient on Friday. CM verified contact information with patient and updated [facility].     Discharge Plan:  Home with Home Health and DME (code 6)  Home Health - [facility]  DME - [facility] [employer] - Wound Vac    The patient will continue to be evaluated for developing discharge needs.     Case Manager: Charmaine Prima, RN  Phone: [phone]

## 2023-12-27 NOTE — Discharge Summary (Incomplete)
 [facility]  DISCHARGE SUMMARY      PATIENT NAME:  Yvette Schmidt, Yvette Schmidt  MRN:  [id number]  DOB:  02/28/1975      ENCOUNTER START DATE: 12/19/2023  INPATIENT ADMISSION DATE: 12/19/2023    DISCHARGE DATE:  12/27/2023    ATTENDING PHYSICIAN: Dan Janita Bastos, MD  PRIMARY CARE PHYSICIAN: Ripley Carmell Puller, MD     ADMISSION DIAGNOSIS: Right foot infection  No chief complaint on file.      DISCHARGE DIAGNOSIS:   Hospital Problems) (* Primary Problem)    Diagnosis Date Noted    *Right foot infection 12/19/2023      Resolved Hospital Problems   No resolved problems to display.     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    Rash and other nonspecific skin eruption 10/13/2022    Insomnia 06/05/2021    GAD (generalized anxiety disorder) 06/05/2021    Open wound 04/05/2021    Abscess 03/27/2021    H/O laparoscopic partial gastrectomy 05/02/2017    Stress test 04/06/2016    2-D echo 04/06/2016    Depression 01/02/2006    Headache 12/13/2005    Migraine Headaches 10/19/2005    GERD 08/15/2005    Sleep Apnea 09/30/2003    Asthma 08/22/2003    Type 2 diabetes mellitus with peripheral neuropathy (CMS HCC) 08/22/2003        DISCHARGE MEDICATIONS:     Current Discharge Medication List        START taking these medications.        Details   ciprofloxacin  HCl 500 mg Tablet  Commonly known as: CIPRO    500 mg, Oral, 2 TIMES DAILY  Qty: 84 Tablet  Refills: 0     fluconazole  200 mg Tablet  Commonly known as: DIFLUCAN    400 mg, Oral, Daily  Qty: 180 Tablet  Refills: 1     metroNIDAZOLE  500 mg Tablet  Commonly known as: FLAGYL    500 mg, Oral, 2 TIMES DAILY  Qty: 84 Tablet  Refills: 0            CONTINUE these medications - NO CHANGES were made during your visit.        Details   Accu-Chek Guide Glucose Meter Misc  Generic  drug: Blood-Glucose Meter   Use three times daily with lancets and test strips  Qty: 1 Each  Refills: 0     Accu-Chek Guide test strips Strip  Generic drug: Blood Sugar Diagnostic   USE 1 STRIP TO CHECK BLOOD SUGAR FOUR TIMES DAILY BEFORE MEALS AND AT BEDTIME  Qty: 400 Each  Refills: 1     Accu-Chek Softclix Lancets Misc  Generic drug: Lancets   Use to Check blood Sugar Three Times Daily  Qty: 100 Each  Refills: 3     * albuterol  sulfate 2.5 mg /3 mL (0.083 %) nebulizer solution  Commonly known as: PROVENTIL    Inhale 3 mL (2.5 mg total) Via Nebulizer Every 6 hours as needed for Wheezing  Qty: 180 mL  Refills: 2     * albuterol  sulfate 90 mcg/actuation oral inhaler  Commonly known as: Ventolin  HFA   INHALE 1-2 PUFFS BY INHALATION EVERY 6 HOURS AS NEEDED  Qty: 3 Each  Refills: 2     amitriptyline  50 mg  Tablet  Commonly known as: ELAVIL    50 mg, Oral, NIGHTLY  Qty: 90 Tablet  Refills: 0     aspirin  81 mg Tablet, Chewable   81 mg, Daily  Refills: 0     azelastine  137 mcg (0.1 %) Spray, Non-Aerosol  Commonly known as: ASTELIN    ADMINISTER 1 SPRAY INTO EACH NOSTRIL TWICE DAILY FOR 30 DAYS  Qty: 30 mL  Refills: 0     betamethasone  valerate 0.1 % Ointment  Commonly known as: VALISONE    Topical, 2 TIMES DAILY  Qty: 45 g  Refills: 11     clonazePAM  0.5 mg Tablet  Commonly known as: klonoPIN    0.5 mg, Oral, 2 TIMES DAILY  Qty: 45 Tablet  Refills: 0     EPINEPHrine  0.3 mg/0.3 mL Auto-Injector   0.3 mg, IntraMUSCULAR, ONCE PRN  Qty: 2 Each  Refills: 0     fluticasone  propionate 50 mcg/actuation Spray, Suspension  Commonly known as: FLONASE    2 Sprays, Each Nostril, 2 TIMES DAILY  Qty: 16 g  Refills: 11     furosemide  40 mg Tablet  Commonly known as: LASIX    40 mg, Oral, DAILY  Qty: 90 Tablet  Refills: 3     gabapentin  800 mg Tablet  Commonly known as: NEURONTIN    Take 1/2 tablet by mouth in the morning and afternoon, then take 1 tablet in the evening.  Qty: 60 Tablet  Refills: 0     Ibuprofen  200 mg Tablet  Commonly known as:  MOTRIN    800 mg, 4 TIMES DAILY PRN  Refills: 0     Insulin  Needles (Disposable) 32 gauge x 1/4 Needle  Commonly known as: Sure Comfort Pen Needle   USE TO INJECT INSULIN  TWICE DAILY  Qty: 100 Each  Refills: 3     ipratropium 0.02 % Solution  Commonly known as: ATROVENT    0.25 mg, Nebulization, EVERY 6 HOURS  Qty: 150 mL  Refills: 0     Januvia  100 mg Tablet  Generic drug: SITagliptin  phosphate   100 mg, Oral, DAILY  Qty: 90 Tablet  Refills: 4     Linzess  290 mcg Capsule  Generic drug: linaCLOtide    Take 1 Capsule (290 mcg total) by mouth Every morning  Qty: 90 Capsule  Refills: 0     meloxicam  15 mg Tablet  Commonly known as: MOBIC    15 mg, Oral, NIGHTLY  Qty: 90 Tablet  Refills: 0     mineral oil-hydrophil petrolat Ointment  Commonly known as: AQUAPHOR   Apply Topically, 4 TIMES DAILY PRN  Qty: 1 Each  Refills: 0     Nebulizers Misc   Use with Nebulizer medication  Qty: 1 Each  Refills: 0     NovoLOG  Flexpen U-100 Insulin  100 unit/mL (3 mL) Insulin  Pen  Generic drug: insulin  aspart U-100   Subcutaneous, 2 TIMES DAILY BEFORE MEALS, per Sliding Scale. <150 No injection, 150-200 2 units, 201-250 4 units, 251-300 6 units, 301-350 9 units, 351-400 12 units  Qty: 15 mL  Refills: 0     pantoprazole  40 mg Tablet, Delayed Release (E.C.)  Commonly known as: PROTONIX    40 mg, Oral, 2 TIMES DAILY  Qty: 180 Tablet  Refills: 0     rosuvastatin  20 mg Tablet  Commonly known as: CRESTOR    20 mg, Oral, EVERY EVENING  Qty: 90 Tablet  Refills: 4     sennosides-docusate sodium  8.6-50 mg Tablet  Commonly known as: SENOKOT-S   1 Tablet, Oral, 2 TIMES DAILY  Qty: 60 Tablet  Refills: 0     SUMAtriptan  50 mg Tablet  Commonly known as: IMITREX    TAKE 1 TABLET BY MOUTH ONCE DAILY AS NEEDED FOR MIGRAINES MAY REPEAT IN 2 HOURS IF NEEDED FOR 30 DAYS DO NOT EXCEED MORE THAN 9 DOSES IN A 30 DAY PERIOD  Qty: 9 Tablet  Refills: 0     Symbicort  160-4.5 mcg/actuation oral inhaler  Generic drug: budesonide -formoteroL    2 Puffs, Inhalation, 2 TIMES  DAILY  Qty: 10.2 g  Refills: 0     vitamin B complex  Tablet   1 Tablet, Daily  Refills: 0           * This list has 2 medication(s) that are the same as other medications prescribed for you. Read the directions carefully, and ask your doctor or other care provider to review them with you.                ASK your doctor about these medications.        Details   cetirizine  10 mg Tablet  Commonly known as: zyrTEC   Ask about: Should I take this medication?   10 mg, Oral, Daily  Qty: 90 Tablet  Refills: 0     lactulose  10 gram/15 mL Solution  Commonly known as: ENULOSE    30 mL, Oral, Daily  Qty: 900 mL  Refills: 0     mupirocin  2 % Ointment  Commonly known as: BACTROBAN    Apply Topically, 3 TIMES DAILY  Qty: 30 g  Refills: 3     oxyCODONE  5 mg Tablet  Commonly known as: ROXICODONE   Ask about: Should I take this medication?   10 mg, Oral, EVERY 4 HOURS PRN  Qty: 24 Tablet  Refills: 0     polyethylene glycol 17 gram Powder in Packet  Commonly known as: MIRALAX   Ask about: Should I take this medication?   17 g, Oral, DAILY PRN  Qty: 20 Packet  Refills: 0     simethicone  80 mg Tablet, Chewable  Commonly known as: MYLICON   80 mg, Oral, EVERY 6 HOURS PRN  Qty: 120 Tablet  Refills: 0              DISCHARGE INSTRUCTIONS:      Refer to Home Health - [facility]   Referral Type: Home Health   Requested Specialty: HOME HEALTH AGENCY   Number of Visits Requested: 999     DISCHARGE INSTRUCTION - RESUME HOME DIET     Diet: RESUME HOME DIET      DISCHARGE INSTRUCTION - DIABETIC DIET     Diet: DIABETIC DIET      FOLLOW-UP: GASTROENTEROLOGY - [facility] - PHYSICIANS OFFICE BLDG - [place], [place]     Reason for visit: HOSPITAL DISCHARGE    Post Discharge Destination: Home    Diagnosis Nausea & vomiting [277057]    Follow-up in: FIRST AVAILABLE    Provider: Siva      FOLLOW-UP: VASCULAR - [facility] - PHYSICIAN OFFICE [facility]LDG - [place], [place]     Reason for visit: HOSPITAL DISCHARGE    Post Discharge  Destination: Home    Diagnosis Acute osteomyelitis of right foot (CMS HCC) [8799687]    Follow-up in: 2 WEEKS      FOLLOW-UP: [facility]     REASON FOR VISIT? Right foot osteomyelitis    Follow-up in: 2 WEEKS    WHICH LOCATION? [facility]  Provider: Taft      Post-Discharge Follow Up Appointments       Follow up with [facility] GASTROENTEROLOGY    Phone: [phone]    Where: [address], [place] [address]    Call Rod Norleen LABOR, MD    Phone: [phone]    Where: [facility]    Call Taft Carrier, [place]    Phone: [phone]    Where: [facility]    Call [facility]    Phone: [phone]    Where: [address] [place] [address]    Follow up with [facility]    Phone: [phone]    Where: [address] [place] [address] [address]      Tuesday Jan 23, 2024    Hospital Follow Up with Taft Carrier, DO at 10:00 AM      Infectious Diseases, Physician Office Building  Physician Office Building, [facility]  [address]  [place] [address]  [phone]            REASON FOR HOSPITALIZATION AND HOSPITAL COURSE:  This is a 48 y.o., female       ***    SIGNIFICANT PHYSICAL FINDINGS: ***  SIGNIFICANT LAB: ***  SIGNIFICANT RADIOLOGY: ***  CONSULTATIONS: ***  PROCEDURES PERFORMED: ***        COURSE IN HOSPITAL: ***    DOES PATIENT HAVE ADVANCED DIRECTIVES:  No, Information Offered and Refused    ADVANCED CARE PLANNING - {POSTFORMDOC:500241161}    CONDITION ON DISCHARGE: {CONDITION:50023617}    DISCHARGE DISPOSITION:  {DISPOSITION PLANNING:50025003::Home discharge }    Copies sent to Care Team         Relationship Specialty Notifications Start End    Ripley, Ripley Pop, MD PCP - General INTERNAL MEDICINE Admissions 03/25/21     Phone: [phone] Fax: [phone]         [address] [place] [address]              Dan Janita Bastos, MD

## 2023-12-27 NOTE — Nurses Notes (Signed)
 Pt is pleasant, A&Ox4 and remained free from falls for the duration of the shift.1 person    Pt was on RA and tolerated well.  IV site remained clean, dry, and intact.    Wound vac remained patent, draining serosanguinous fluid     PRN pain medication administered to aid in patient comfort, see MAR.       Vs remained stable throughout the shift. Pt free from complaints at this time and will continue to be monitored and reevaluated.   Call bell is within reach, questions or comments regarding plan of care encouraged.  BP (!) 131/103   Pulse 97   Temp 36.7 C (98.1 F)   Resp 16   Ht 1.651 m (5' 5)   Wt (!) 138 kg (303 lb 2.1 oz)   SpO2 94%   BMI 50.44 kg/m   Dorlene Sauer, LPN

## 2023-12-29 ENCOUNTER — Other Ambulatory Visit (HOSPITAL_COMMUNITY): Payer: Self-pay | Admitting: Medical

## 2023-12-29 ENCOUNTER — Non-Acute Institutional Stay: Payer: MEDICAID | Attending: Vascular & Interventional Radiology

## 2023-12-29 ENCOUNTER — Telehealth (HOSPITAL_COMMUNITY): Payer: Self-pay | Admitting: Medical

## 2023-12-29 ENCOUNTER — Other Ambulatory Visit: Payer: MEDICAID

## 2023-12-29 ENCOUNTER — Telehealth (HOSPITAL_BASED_OUTPATIENT_CLINIC_OR_DEPARTMENT_OTHER): Payer: Self-pay | Admitting: Internal Medicine

## 2023-12-29 DIAGNOSIS — M869 Osteomyelitis, unspecified: Secondary | ICD-10-CM

## 2023-12-29 MED ORDER — ITRACONAZOLE 100 MG CAPSULE: 200.0000 mg | ORAL_CAPSULE | Freq: Every day | ORAL | 1 refills | Status: DC

## 2023-12-29 MED ORDER — POSACONAZOLE 100 MG TABLET,DELAYED RELEASE: 300.0000 mg | DELAYED_RELEASE_TABLET | Freq: Every day | ORAL | 1 refills | Status: AC

## 2023-12-29 NOTE — Telephone Encounter (Signed)
 Notice received from CoverMyMeds indicating Itraconazole  requires prior auth.  Auth initiated via CoverMyMeds. Culture results uploaded to review.  Awaiting determination.  Key:  SHERIDAN Naomie Ernst, MA  12/29/2023, 15:11

## 2023-12-30 ENCOUNTER — Other Ambulatory Visit: Payer: Self-pay

## 2023-12-30 NOTE — Home Health (Signed)
 PER PATIENT'S REPORT UPON SOC:    SEPTEMBER HAD THE WOUND TO THE RIGHT FOOT    HUSBAND IN HOSP. WITH BLOCKAGE AND HAD BYPASS AND ENDED UP WITH AMPUTATION.      IN THE MEAN TIME THE PATIENT HAD A DM II ULCER TO RIGHT FOOT WHICH CONTINUED TO WORSEN. WENT TO ER AND HAD I.V ABX AROUND OCTOBER 7TH. THEN THE ULCER CONTINUED TO WORSEN. PLANNED TO GO TO WOUND CARE THEN FOLLOWED- UP WITH DR. ADENIYI. THEN HAD LEFT PINKY TOE TO ALMOST ANKLE BONE D/T CONDITION OF WOUND.      NOVEMBER 11 WAS IN HOSPITAL     SPENT LAST BIRTHDAY AND THANKSGIVING HOSPITAL    BEEN IN AND OUT OF HOSPTIAL SEVERAL TIMES FOR 2 MONTHS      DM II SINCE 2018    ON INSULIN  NOVOLOG   OVER 400 12 UNITS    NOT ON BLOOD THINNERS      WOUND VAC- M,W,F      ON ORAL ABX X 2                  ON-GOING COUGH/ CONGESTION D/T COPD    N/V/D    NUMBNESS/ TINGLING TO HANDS/ FEET    WEAKNESS    H/A OR VISION CHANGES    BURNING WITH URINATION     BM ISSUES      OPEN WOUNDS/ SORES      DM II      HTN          ANY UPCOMING APPOINTMENTS?

## 2024-01-01 ENCOUNTER — Other Ambulatory Visit: Payer: MEDICAID

## 2024-01-01 ENCOUNTER — Other Ambulatory Visit: Payer: Self-pay

## 2024-01-01 NOTE — Care Management Notes (Signed)
 Referral Information  ++++++ Placed Provider #1 ++++++  Case Manager: Raylee Radabaugh  Provider Type: Home Health  Provider Name: [facility]- Adrain Alar,  [place], Alphonso Aschoff [facility]  Address:  [address]  [place], [place] [address]  Contact: stephenie bartlett    Phone: [phone] x  Fax:   Fax: [phone]

## 2024-01-01 NOTE — Telephone Encounter (Signed)
 Auth denied stating Itraconazole  does not cover Candida krusei.  It appears Darryle had been made aware of this and prescribed Posaconazole  on 12/29/2023.  Naomie Ernst, MA  01/01/2024, 07:34

## 2024-01-01 NOTE — Home Health (Signed)
 Patient identified by two factors, alert and oriented x4, pleasant affect denies pain or discomfort, VS WNL, head to toe assessment completed, heart rate regular, lung sounds clear to auscultation, continent of bowel and bladder, bowel sounds positive in all four quads, voiding without difficulty, appetite good, no signs of edema observed, completed wound care as ordered, reviewed medications, educated pt on diabetes, wound care, s/s of infection, infection control, and to call office with any changes, questions, or concerns, pt voiced understanding. Patient remains homebound d/t Needs assistance of at least one other person to leave the home d/t need for use of offloading shoe, careplan for next visit is to complete wound care, assessment, and pt education.

## 2024-01-02 LAB — SUSCEPTIBILITY, YEAST, COMP PANEL

## 2024-01-03 ENCOUNTER — Other Ambulatory Visit: Payer: Self-pay

## 2024-01-03 NOTE — Home Health (Signed)
 Patient identified by two factors, alert and oriented x4, pleasant affect denies pain or discomfort, VS WNL, head to toe assessment completed, heart rate regular, lung sounds clear to auscultation, continent of bowel and bladder, bowel sounds positive in all four quads, voiding without difficulty, appetite good, no signs of edema observed, completed wound care as ordered, reviewed medications, educated pt on diabetes, wound care, s/s of infection, infection control, and to call office with any changes, questions, or concerns, pt voiced understanding. Patient remains homebound d/t Needs assistance of at least one other person to leave the home d/t need for use of offloading shoe, careplan for next visit is to complete wound care, assessment, and pt education.

## 2024-01-04 ENCOUNTER — Other Ambulatory Visit (INDEPENDENT_AMBULATORY_CARE_PROVIDER_SITE_OTHER): Payer: Self-pay | Admitting: Internal Medicine

## 2024-01-04 MED ORDER — AMITRIPTYLINE 50 MG TABLET: 50.0000 mg | ORAL_TABLET | Freq: Every evening | ORAL | 0 refills | Status: DC

## 2024-01-04 MED ORDER — MELOXICAM 15 MG TABLET: 15.0000 mg | ORAL_TABLET | Freq: Every evening | ORAL | 0 refills | Status: DC

## 2024-01-04 MED ORDER — INSULIN ASPART (U-100) 100 UNIT/ML (3 ML) SUBCUTANEOUS PEN: 20.0000 [IU] | PEN_INJECTOR | Freq: Two times a day (BID) | SUBCUTANEOUS | 0 refills | Status: DC

## 2024-01-04 MED ORDER — ROSUVASTATIN 20 MG TABLET: 20.0000 mg | ORAL_TABLET | Freq: Every evening | ORAL | 4 refills | Status: AC

## 2024-01-04 MED ORDER — FLUTICASONE PROPIONATE 50 MCG/ACTUATION NASAL SPRAY,SUSPENSION: 2.0000 | Freq: Two times a day (BID) | NASAL | 4 refills | Status: AC

## 2024-01-04 MED ORDER — PEN NEEDLE, DIABETIC 32 GAUGE X 1/4": 3 refills | Status: AC

## 2024-01-04 MED ORDER — SUMATRIPTAN 50 MG TABLET: ORAL_TABLET | ORAL | 0 refills | Status: DC

## 2024-01-04 MED ORDER — IPRATROPIUM BROMIDE 0.02 % SOLUTION FOR INHALATION: 0.2500 mg | Freq: Four times a day (QID) | RESPIRATORY_TRACT | 0 refills | Status: AC

## 2024-01-04 MED ORDER — BUDESONIDE-FORMOTEROL HFA 160 MCG-4.5 MCG/ACTUATION AEROSOL INHALER: 2.0000 | INHALATION_SPRAY | Freq: Two times a day (BID) | RESPIRATORY_TRACT | 0 refills | Status: AC

## 2024-01-04 MED ORDER — PANTOPRAZOLE 40 MG TABLET,DELAYED RELEASE: 40.0000 mg | DELAYED_RELEASE_TABLET | Freq: Two times a day (BID) | ORAL | 0 refills | Status: DC

## 2024-01-04 MED ORDER — ALBUTEROL SULFATE HFA 90 MCG/ACTUATION AEROSOL INHALER: INHALATION_SPRAY | RESPIRATORY_TRACT | 2 refills | Status: AC

## 2024-01-04 MED ORDER — ALBUTEROL SULFATE 2.5 MG/3 ML (0.083 %) SOLUTION FOR NEBULIZATION: 2.5000 mg | INHALATION_SOLUTION | Freq: Three times a day (TID) | RESPIRATORY_TRACT | 1 refills | Status: AC | PRN

## 2024-01-04 MED ORDER — SITAGLIPTIN PHOSPHATE 100 MG TABLET: 100.0000 mg | ORAL_TABLET | Freq: Every day | ORAL | 4 refills | Status: AC

## 2024-01-04 MED ORDER — FUROSEMIDE 40 MG TABLET: 40.0000 mg | ORAL_TABLET | Freq: Every day | ORAL | 0 refills | Status: AC

## 2024-01-04 MED ORDER — SENNOSIDES 8.6 MG-DOCUSATE SODIUM 50 MG TABLET: 1.0000 | ORAL_TABLET | Freq: Two times a day (BID) | ORAL | 0 refills | Status: AC

## 2024-01-04 NOTE — Telephone Encounter (Signed)
 Shoulders, Yvette Schmidt, Yvette Densmore, MA  Pt states her controls have not been called in.  Stated she that she has recently been in the hospital and has a wound vac on her foot which makes it hard to ambulate.  Also states her husbands controled med has not been sent over either. If you could please send to Dr Avelino to sign off on. [facility]

## 2024-01-05 ENCOUNTER — Other Ambulatory Visit: Payer: MEDICAID

## 2024-01-05 ENCOUNTER — Other Ambulatory Visit (INDEPENDENT_AMBULATORY_CARE_PROVIDER_SITE_OTHER): Payer: Self-pay | Admitting: Internal Medicine

## 2024-01-05 MED ORDER — GABAPENTIN 800 MG TABLET: ORAL_TABLET | ORAL | 0 refills | Status: DC

## 2024-01-05 MED ORDER — CLONAZEPAM 0.5 MG TABLET: 0.5000 mg | ORAL_TABLET | Freq: Two times a day (BID) | ORAL | 0 refills | Status: DC

## 2024-01-05 MED ORDER — AMITRIPTYLINE 50 MG TABLET: 50.0000 mg | ORAL_TABLET | Freq: Every evening | ORAL | 0 refills | Status: AC

## 2024-01-05 MED ORDER — OXYCODONE 5 MG TABLET: 10.0000 mg | ORAL_TABLET | ORAL | 0 refills | Status: DC | PRN

## 2024-01-05 NOTE — Nursing Note (Signed)
 Services currently provided to patient:  Skilled Nursing    Case Conference Notes:  Patient discussed for case conference date 01/05/24. No issues noted at the time of this case conference.     Summary of Care:  CLINICIAN NAME: MANUELITA POUCH  [facility] DATE: 12/29/23  REFERRAL DATE: 12/27/23  REFERRED TO HH BY: [place]  ACTIVE PROBLEMS: RIGHT FOOT/ ANKLE OSTEO, DM II, AND COPD.   ACTIVE DISCIPLINES: SN         ADVANCE CARE PLANNING:    DOES PATIENT HAVE CAPACITY?    YES    IF, NO DOES PATIENT HAVE AN ACTIVE MPOA OR HEALTH CARE SURROGATE?   NO      INRUSANCE:    INSURANCE:   MEDICAID    ANY UPCOMING INSURANCE CHANGES?  NO    HOMEBOUND STATUS:    DOES THIS PATIENT DRIVE?  NO    HOW OFTEN DOES PATIENT LEAVE HOME?    APPROX. 1 TIME PER MONTH FOR MD APPOINTMENTS    PATIENT LEAVES HOME FOR:  MD APPOINTMENTS      ASSISTIVE DEVICES USED WHEN LEAVING THE HOME?  OFFLOADING SHOE      SUPPLIES:        ARE WE CURRENLTY ORDERING SUPPLIES FOR PATIENT? YES      VISIT FREQUEN  CY:    SN: 3 TIMES WEEKLY        DOES PATIENT HAVE FREQUENT MISSED VISITS?  N/A SOC    LUPA THRESHOLD FOR 1ST 30 DAYS: SOC  LUPA THRESHOLD FOR 2ND 30 DAYS: SOC        SKILLED NEED:      DESCRIBE THE CURRENT SKILLED NEED:  SN: ASSESSMENTS, MED. MGT.,   AND WOUND VAC CHANGES    IS THIS SERVICES REASONABLE AND MEDICALLY NECESSARY?  YES      WOUNDS:      DOES PATIENT HAVE A WOUND?  YES    HOW LONG HAS THE WOUND BEEN PRESENT?  SOC    IS THE WOUND HEALING?  YES    DOES PATIENT FOLLOW- UP WITH A WOUND CA  RE PROVIDER?  NO    HOW LONG HAVE WE BEEN PERFORMING THE CURRENT DRESSING REGIMEN?   SOC    WHO COMPELTES WOUND CARE IN THE ABSENCE OF SN?  N/A      REHOSPITALIZATION:      WHAT IS THE PATIENT'S RISK FOR REHOSPITALIZATION?     LOW, MODERATE, OR HIGH:   MODERATE D/T CHRONIC HEALTH CONDITIONS, RISK FOR FALLS, AND RISK FOR INFECTION.      HAS THE PATIENT HAD ANY ED VISITS SINCE THE MOST RECENT SOC/ ROC?  N/A SOC    WHY?  N/A    HAS THE PATIENT BEEN ADMITTED TO THE HOSPITAL  SINCE THE MOST RECENT SOC/   ROC?  N/A SOC    IF YES WHY?  N/A      GOALS:      PATIENT'S PRIMARY GOAL FOR HOME HEALTH?  INFECTION PREVENTION, WOUND PREVENTION. GAIN STRENGTH    IS THE GOAL REASONABLE AND ACHIEVABLE?  YES      IS THE PATIENT PROGRESSING TOWARDS THEIR GOALS?  YES          PSYCHOLOGICAL NEEDS:        BREIFLY DESCRIBE PATIENT'S HOME ENVIRONMENT?      CLEAN AND KEPT      SAFETY ISSUES NEEDING ADDRESSED?  NONE      DOES THE PATIENT HAVE A CAREGIVER AND HOW OFTEN ARE THEY PRESENT?  YES PATIENT'S ADULT SISTER/ MOTHE  R ASSIST WITH CARE AS NECESSARY.      DOES THE CAREGIVER NEED ADDITONAL TRAINING AND/ OR EDUCATION?   NO    IS THIS PATIENT ACTIVE WITH POPULATION HEALTH?  NO        IS THIS PATIENT APPROPRIATE FOR THE HOSPICE BRIDGE?  NO      DISCHARGE PLANNING:      ARE THERE ANY DME NEEDS?  NO    SUPPLY NEEDS AT DISCHARGE?  NO    WHERE IS THE PATIENT BEING DISCHARGED TO?  HOME/ INDEPENDENT CARE          MSW NEEDS PRIOR TO DISCHARGE?  NO    Priority Issues:       Discipline Notes:  CLINICIAN NAME: MANUELITA POUCH  SOC DATE: 12/29/23  REFERRAL DATE: 12/27/23  REFERRED TO HH BY: [facility]  ACTIVE PROBLEMS: RIGHT FOOT/ ANKLE OSTEO, DM II, AND COPD.   ACTIVE DISCIPLINES: SN         ADVANCE CARE PLANNING:    DOES PATIENT HAVE CAPACITY?  , YES    IF, NO DOES PATIENT HAVE AN ACTIVE MPOA OR HEALTH CARE SURROGATE?   NO      INRUSANCE:    INSURANCE:   MEDICAID    ANY UPCOMING INSURANCE CHANGES?  NO    HOMEBOUND STATUS:    DOES THIS PATIENT DRIVE?  NO    HOW OFTEN DOES PATIENT LEAVE HOME?  , APPROX. 1 TIME PER MONTH FOR MD APPOINTMENTS    PATIENT LEAVES HOME FOR:  MD APPOINTMENTS      ASSISTIVE DEVICES USED WHEN LEAVING THE HOME?  OFFLOADING SHOE      SUPPLIES:        ARE WE CURRENLTY ORDERING SUPPLIES FOR PATIENT? YES      VISIT FREQUEN, CY:    SN: 3 TIMES WEEKLY        DOES PATIENT HAVE FREQUENT MISSED VISITS?  N/A SOC    LUPA THRESHOLD FOR 1ST 30 DAYS: SOC  LUPA THRESHOLD FOR 2ND 30 DAYS: SOC        SKILLED  NEED:      DESCRIBE THE CURRENT SKILLED NEED:  SN: ASSESSMENTS, MED. MGT., , AND WOUND VAC CHANGES    IS THIS SERVICES REASONABLE AND MEDICALLY NECESSARY?  YES      WOUNDS:      DOES PATIENT HAVE A WOUND?  YES    HOW LONG HAS THE WOUND BEEN PRESENT?  SOC    IS THE WOUND HEALING?  YES    DOES PATIENT FOLLOW- UP WITH A WOUND CA, RE PROVIDER?  NO    HOW LONG HAVE WE BEEN PERFORMING THE CURRENT DRESSING REGIMEN?   SOC    WHO COMPELTES WOUND CARE IN THE ABSENCE OF SN?  N/A      REHOSPITALIZATION:      WHAT IS THE PATIENT'S RISK FOR REHOSPITALIZATION?     LOW, MODERATE, OR HIGH:,  MODERATE D/T CHRONIC HEALTH CONDITIONS, RISK FOR FALLS, AND RISK FOR INFECTION.      HAS THE PATIENT HAD ANY ED VISITS SINCE THE MOST RECENT SOC/ ROC?  N/A SOC    WHY?  N/A    HAS THE PATIENT BEEN ADMITTED TO THE HOSPITAL SINCE THE MOST RECENT SOC/ , ROC?  N/A SOC    IF YES WHY?  N/A      GOALS:      PATIENT'S PRIMARY GOAL FOR HOME HEALTH?  INFECTION PREVENTION, WOUND PREVENTION. GAIN STRENGTH  IS THE GOAL REASONABLE AND ACHIEVABLE?  YES      IS THE PATIENT PROGRESSING TOWARDS THEIR GOALS?  YES,         PSYCHOLOGICAL NEEDS:        BREIFLY DESCRIBE PATIENT'S HOME ENVIRONMENT?      CLEAN AND KEPT      SAFETY ISSUES NEEDING ADDRESSED?  NONE      DOES THE PATIENT HAVE A CAREGIVER AND HOW OFTEN ARE THEY PRESENT?  YES PATIENT'S ADULT SISTER/ MOTHE, R ASSIST WITH CARE AS NECESSARY.      DOES THE CAREGIVER NEED ADDITONAL TRAINING AND/ OR EDUCATION?   NO    IS THIS PATIENT ACTIVE WITH POPULATION HEALTH?  NO        IS THIS PATIENT APPROPRIATE FOR THE HOSPICE BRIDGE?  NO      DISCHARGE PLANNING:    , ARE THERE ANY DME NEEDS?  NO    SUPPLY NEEDS AT DISCHARGE?  NO    WHERE IS THE PATIENT BEING DISCHARGED TO?  HOME/ INDEPENDENT CARE          MSW NEEDS PRIOR TO DISCHARGE?  NO

## 2024-01-05 NOTE — Telephone Encounter (Signed)
 RX  Received: Today  Yvette Salines, MA  Menard, Bryson, MA  Patients clonipin and amatailin (SP) was not called in. [place] [facility]

## 2024-01-05 NOTE — Telephone Encounter (Signed)
 Pharmacy Question  Received: Yvette Schmidt, Cam[place], MA  [place], [place]  Pharmacy has questions about her insulin  that was called in. Roger called to talk with you.

## 2024-01-05 NOTE — Home Health (Signed)
 Patient identified by two factors, alert and oriented x4, pleasant affect denies pain or discomfort, VS WNL, head to toe assessment completed, heart rate regular, lung sounds clear to auscultation, continent of bowel and bladder, bowel sounds positive in all four quads, voiding without difficulty, appetite good, no signs of edema observed, completed wound care as ordered, reviewed medications, educated pt on diabetes, wound care, s/s of infection, infection control, and to call office with any changes, questions, or concerns, pt voiced understanding. Patient remains homebound d/t Needs assistance of at least one other person to leave the home d/t need for use of offloading shoe, careplan for next visit is to complete wound care, assessment, and pt education.

## 2024-01-08 ENCOUNTER — Other Ambulatory Visit: Payer: Self-pay

## 2024-01-08 NOTE — Home Health (Signed)
 Patient identified by two factors, alert and oriented x4, pleasant affect denies pain or discomfort, VS WNL, head to toe assessment completed, heart rate regular, lung sounds clear to auscultation, continent of bowel and bladder, bowel sounds positive in all four quads, voiding without difficulty, appetite good, no signs of edema observed, completed wound care as ordered, reviewed medications, educated pt on diabetes, wound care, s/s of infection, infection control, and to call office with any changes, questions, or concerns, pt voiced understanding. Patient remains homebound d/t Needs assistance of at least one other person to leave the home d/t need for use of offloading shoe, careplan for next visit is to complete wound care, assessment, and pt education.

## 2024-01-08 NOTE — Discharge Summary (Signed)
 [facility]  DISCHARGE SUMMARY      PATIENT NAME:  Yvette Schmidt, Yvette Schmidt  MRN:  [id number]  DOB:  Dec 28, 1975      ENCOUNTER START DATE: 12/19/2023  INPATIENT ADMISSION DATE: 12/19/2023    DISCHARGE DATE:  12/27/2023    ATTENDING PHYSICIAN: Dan Janita Bastos, MD  PRIMARY CARE PHYSICIAN: Ripley Carmell Puller, MD     ADMISSION DIAGNOSIS: Right foot infection  No chief complaint on file.    DISCHARGE DIAGNOSIS:   Hospital Problems) (* Primary Problem)    Diagnosis Date Noted    *Right foot infection 12/19/2023      Resolved Hospital Problems   No resolved problems to display.     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    Rash and other nonspecific skin eruption 10/13/2022    Insomnia 06/05/2021    GAD (generalized anxiety disorder) 06/05/2021    Open wound 04/05/2021    Abscess 03/27/2021    H/O laparoscopic partial gastrectomy 05/02/2017    Stress test 04/06/2016    2-D echo 04/06/2016    Depression 01/02/2006    Headache 12/13/2005    Migraine Headaches 10/19/2005    GERD 08/15/2005    Sleep Apnea 09/30/2003    Asthma 08/22/2003    Type 2 diabetes mellitus with peripheral neuropathy (CMS HCC) 08/22/2003        DISCHARGE MEDICATIONS:     Current Discharge Medication List        START taking these medications.        Details   ciprofloxacin  HCl 500 mg Tablet  Commonly known as: CIPRO    500 mg, Oral, 2 TIMES DAILY  Qty: 84 Tablet  Refills: 0     metroNIDAZOLE  500 mg Tablet  Commonly known as: FLAGYL    500 mg, Oral, 2 TIMES DAILY  Qty: 84 Tablet  Refills: 0            CONTINUE these medications which have CHANGED during your visit.        Details   betamethasone  valerate 0.1 % Ointment  Commonly known as: VALISONE   What changed:   how much to take  additional instructions   Topical, 2 TIMES DAILY  Qty:  45 g  Refills: 11            CONTINUE these medications - NO CHANGES were made during your visit.        Details   Accu-Chek Guide Glucose Meter Misc  Generic drug: Blood-Glucose Meter   Use three times daily with lancets and test strips  Qty: 1 Each  Refills: 0     Accu-Chek Guide test strips Strip  Generic drug: Blood Sugar Diagnostic   USE 1 STRIP TO CHECK BLOOD SUGAR FOUR TIMES DAILY BEFORE MEALS AND AT BEDTIME  Qty: 400 Each  Refills: 1     aspirin  81 mg Tablet, Chewable   81 mg, Daily  Refills: 0     EPINEPHrine  0.3 mg/0.3 mL Auto-Injector   0.3 mg, IntraMUSCULAR, ONCE PRN  Qty: 2 Each  Refills: 0     Ibuprofen  200 mg Tablet  Commonly known as: MOTRIN    800 mg, 4 TIMES DAILY PRN  Refills: 0     Linzess  290 mcg Capsule  Generic drug: linaCLOtide    Take  1 Capsule (290 mcg total) by mouth Every morning  Qty: 90 Capsule  Refills: 0     mineral oil-hydrophil petrolat Ointment  Commonly known as: AQUAPHOR   Apply Topically, 4 TIMES DAILY PRN  Qty: 1 Each  Refills: 0     Nebulizers Misc   Use with Nebulizer medication  Qty: 1 Each  Refills: 0     vitamin B complex  Tablet   1 Tablet, Daily  Refills: 0            STOP taking these medications.      albuterol  sulfate 2.5 mg /3 mL (0.083 %) nebulizer solution  Commonly known as: PROVENTIL      fluticasone  propionate 50 mcg/actuation Spray, Suspension  Commonly known as: FLONASE      furosemide  40 mg Tablet  Commonly known as: LASIX      Symbicort  160-4.5 mcg/actuation oral inhaler  Generic drug: budesonide -formoteroL             ASK your doctor about these medications.        Details   Accu-Chek Softclix Lancets Misc  Generic drug: Lancets  Ask about: Which instructions should I use?   USE TO TEST BLOOD SUGAR THREE TIMES DAILY  Qty: 100 Each  Refills: 2     azelastine  137 mcg (0.1 %) Spray, Non-Aerosol  Commonly known as: ASTELIN   Ask about: Which instructions should I use?   ADMINISTER 1 SPRAY INTO EACH NOSTRIL TWICE DAILY FOR 30 DAYS  Qty: 30 mL  Refills: 0     cetirizine   10 mg Tablet  Commonly known as: zyrTEC   Ask about: Should I take this medication?   10 mg, Oral, Daily  Qty: 90 Tablet  Refills: 0     lactulose  10 gram/15 mL Solution  Commonly known as: ENULOSE    30 mL, Oral, Daily  Qty: 900 mL  Refills: 0     mupirocin  2 % Ointment  Commonly known as: BACTROBAN    Apply Topically, 3 TIMES DAILY  Qty: 30 g  Refills: 3     polyethylene glycol 17 gram Powder in Packet  Commonly known as: MIRALAX   Ask about: Should I take this medication?   17 g, Oral, DAILY PRN  Qty: 20 Packet  Refills: 0     simethicone  80 mg Tablet, Chewable  Commonly known as: MYLICON   80 mg, Oral, EVERY 6 HOURS PRN  Qty: 120 Tablet  Refills: 0              DISCHARGE INSTRUCTIONS:      Refer to Home Health - [facility]   Referral Type: Home Health   Requested Specialty: HOME HEALTH AGENCY   Number of Visits Requested: 999     DISCHARGE INSTRUCTION - RESUME HOME DIET     Diet: RESUME HOME DIET      DISCHARGE INSTRUCTION - DIABETIC DIET     Diet: DIABETIC DIET      FOLLOW-UP: GASTROENTEROLOGY - [facility] - PHYSICIANS OFFICE [facility] - [place], [place]     Reason for visit: HOSPITAL DISCHARGE    Post Discharge Destination: Home    Diagnosis Nausea & vomiting [[id number]]    Follow-up in: FIRST AVAILABLE    Provider: Siva      FOLLOW-UP: VASCULAR - [facility] - [facility]YSIC[facility] - [place], [place]     Reason for visit: HOSPITAL DISCHARGE    Post Discharge Destination: Home    Diagnosis Acute osteomyelitis of right foot (  CMS HCC) [8799687]    Follow-up in: 2 WEEKS      FOLLOW-UP:  INFECTIOUS DISEASE CLINICS     REASON FOR VISIT? Right foot osteomyelitis    Follow-up in: 2 WEEKS    WHICH LOCATION? [place]    Provider: Taft      Post-Discharge Follow Up Appointments       Follow up with [facility] GASTROENTEROLOGY    Phone: [phone]    Where: [address], [place] [address]    Call Rod Norleen LABOR, MD    Phone: [phone]    Where: [facility]     Call Taft Carrier, [place]    Phone: [phone]    Where: [facility]    Call [facility]    Phone: [phone]    Where: [address] [place] [address]    Follow up with [facility]    Phone: [phone]    Where: [address] [place] [facility] [address]      Monday Jan 08, 2024    [facility] Home Visit with Nettie Stabs, LPN at  6:99 PM      Wednesday Jan 10, 2024    [facility] Home Visit with Robie Chew, RN at TBD      Friday Jan 12, 2024    [facility] Home Visit with Robie Chew, RN at TBD      Monday Jan 15, 2024    Return Patient Visit with Adeniyi, John A, MD at 10:50 AM      Tuesday Jan 23, 2024    Hospital Follow Up with Taft Carrier, DO at 10:00 AM      Wednesday Feb 28, 2024    Hospital Follow Up with Kathrene Renetta Larger, MD at  2:30 PM      Gastroenterology, Physician Office Building  Physician Office Building, [facility]  [address]  [place] [address]  [phone] Infectious Diseases, Physician Office Building  Physician Office Building, [facility]  [address]  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 ID and vascular surgery consulted on admission. Upon readmission she was taken back to the OR on 12/20/2023 and underwent right foot exploration with further debridement  excisional removal of nonviable skin, subcutaneous tissue, fat, fascia, muscle, tendon with washout and open packing of the wounds. Operative cultures from 12/05/23 would grow candida glabrata for which patient was initiated on micafungin  while operative cultures from this admission finalized. Patient would ultimately grow candida krusei and candida albicans for which patient was initiated on fluconazole  as both of these are typically sensitive to. Patient would repeatedly ask to be discharged pre-maturely during this admission and decline transfer to SNF or other rehab facility  to receive IV antimicrobials, as well refused HH. 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 Patient prescribed course of ciprofloxacin  and flagyl , as well as fluconazole  as above for osteomyelitis as per ID recommendations and discharged with home wound vac with close follow up in vascular and ID clinics for further management.    SIGNIFICANT PHYSICAL FINDINGS: As above.  SIGNIFICANT LAB: As above.  SIGNIFICANT RADIOLOGY: As above.  CONSULTATIONS: As above  PROCEDURES PERFORMED: As above.    DOES PATIENT HAVE ADVANCED DIRECTIVES:  No, Information Offered and Refused    ADVANCED CARE PLANNING - POST form not discussed    CONDITION ON DISCHARGE: Alert, Oriented, and VS Stable    DISCHARGE DISPOSITION:  Home discharge     Copies sent to Care Team         Relationship Specialty Notifications Start End    [place], Ripley Pop, MD PCP - General INTERNAL MEDICINE Admissions 03/25/21     Phone: [phone] Fax: [phone]         [address] [place] [address]              Dan Janita Bastos, MD

## 2024-01-10 ENCOUNTER — Other Ambulatory Visit: Payer: Self-pay

## 2024-01-10 ENCOUNTER — Other Ambulatory Visit: Payer: MEDICAID

## 2024-01-10 NOTE — Home Health (Signed)
 aox4, vitals and assessment completed. lungs cta, bowel sounds x4. Patient states she is going to see dr rod on Monday and request visit stay MWF.  Wound care completed per order utilizing 1 piece of black foam to wound bed and 1 piece to bridge to top of foot.  vac settings at 125.  sister is able to convert to w/d if needed. reviewed alarm settings.  Photo uploaded. Tolerated well.   Encouraged increase in protein for wound healing. Discussed s/s of infection--encouraged good hand washing. Reviewed pressure relieving techniques and encouraged repositioning every 1-2 hours.Reviewed Diabetes Management including fsbs, s/s of hyper and hypo glycemia and treatment of both.  Discussed prolonged effects of hyperglycemia.  Instructed on daily log and safe disposal of supplies. Encouraged ADA diet. fsbs 200's states trying to get them down.   Reviewed COPD management including avoiding irritants, breathing exercises, energy conservation, breathing treatments, pursed lip breathing.  Reviewed HTN management including blood pressure monitoring, diet: including low sodium, low caffeine, low fat.  homebound status: Needs assistance of at least one other person to leave the home d/t need for use of offloading shoe.  next visit: vac

## 2024-01-11 ENCOUNTER — Other Ambulatory Visit: Payer: Self-pay

## 2024-01-11 ENCOUNTER — Encounter (INDEPENDENT_AMBULATORY_CARE_PROVIDER_SITE_OTHER): Payer: Self-pay | Admitting: Vascular & Interventional Radiology

## 2024-01-11 NOTE — Home Health Plan of Care Certification Statement (Signed)
 I certify that this patient's homebound status is limited ambulation due to weakness; fatigues quickly with minimal exertion and he/she needs intermittent skilled nursing services. The patient's current home health diagnosis is other complications of amputation stump. The patient is under my care and I have authorized services on this plan of care and will periodically review the plan. The patient had a face-to-face encounter with an allowed provider type on 12/25/23 and the encounter was related to the primary reason for home health care. The patient is estimated to have services for 6 weeks.

## 2024-01-12 ENCOUNTER — Other Ambulatory Visit: Payer: Self-pay

## 2024-01-12 NOTE — Home Health (Signed)
 Patient identified by two factors, alert and oriented x4, pleasant affect denies pain or discomfort, VS WNL, head to toe assessment completed, heart rate regular, lung sounds clear to auscultation, continent of bowel and bladder, bowel sounds positive in all four quads, voiding without difficulty, appetite good, no signs of edema observed, completed wound care as ordered, reviewed medications, educated pt on diabetes, wound care, s/s of infection, infection control, and to call office with any changes, questions, or concerns, pt voiced understanding. Patient remains homebound d/t Needs assistance of at least one other person to leave the home d/t need for use of offloading shoe, careplan for next visit is to complete wound care, assessment, and pt education.

## 2024-01-15 ENCOUNTER — Other Ambulatory Visit (INDEPENDENT_AMBULATORY_CARE_PROVIDER_SITE_OTHER): Payer: Self-pay | Admitting: Vascular & Interventional Radiology

## 2024-01-15 ENCOUNTER — Ambulatory Visit (INDEPENDENT_AMBULATORY_CARE_PROVIDER_SITE_OTHER): Payer: Self-pay | Admitting: Vascular & Interventional Radiology

## 2024-01-15 ENCOUNTER — Other Ambulatory Visit: Payer: MEDICAID

## 2024-01-15 ENCOUNTER — Encounter (INDEPENDENT_AMBULATORY_CARE_PROVIDER_SITE_OTHER): Payer: Self-pay | Admitting: Vascular & Interventional Radiology

## 2024-01-15 VITALS — BP 162/84 | HR 92 | Temp 98.2°F | Ht 65.0 in | Wt 293.0 lb

## 2024-01-15 DIAGNOSIS — L089 Local infection of the skin and subcutaneous tissue, unspecified: Secondary | ICD-10-CM

## 2024-01-15 DIAGNOSIS — M8610 Other acute osteomyelitis, unspecified site: Secondary | ICD-10-CM

## 2024-01-15 DIAGNOSIS — S91301A Unspecified open wound, right foot, initial encounter: Secondary | ICD-10-CM

## 2024-01-15 MED ORDER — CIPROFLOXACIN 500 MG TABLET: 500.0000 mg | ORAL_TABLET | Freq: Two times a day (BID) | ORAL | 0 refills | Status: DC

## 2024-01-15 MED ORDER — WOUND CLEANSER IRRIGATION SPRAY: 1.0000 | Freq: Every day | 1 refills | Status: AC | PRN

## 2024-01-15 NOTE — Case Communication (Signed)
 missed home health nursing visit as patient has f/u with Dr Reesa office.  Patient request to stay on Wed and Fri visits this week. Per patient and sister they will complete w/d dressing on Tuesday until vac can be resumed on Wednesday.

## 2024-01-16 ENCOUNTER — Encounter (INDEPENDENT_AMBULATORY_CARE_PROVIDER_SITE_OTHER): Payer: Self-pay | Admitting: Vascular & Interventional Radiology

## 2024-01-16 ENCOUNTER — Other Ambulatory Visit: Payer: Self-pay

## 2024-01-16 NOTE — Progress Notes (Signed)
 [facility]  [address]  [place] [address]  Phone: [phone]  Fax: [phone]      Encounter Date: 01/15/2024    Patient ID:  Yvette Schmidt  [id number]    DOB: 1975/02/06  Age: 47 y.o. female    Subjective:     Chief Complaint   Patient presents with    Hospital Follow Up     ROOM 5          Hospital discharge, acute osteomyelitis. Must See Dr Rod          HPI  Yvette Schmidt is a 48 y.o. female who was seen today for hospital follow up. Patient is status post Right foot exploration.  Further debridement excisional with removal of nonviable skin subcutaneous tissue fat fascia muscle tendon. Prior to admission she had Poor compliance at home. Refused skilled nursing facility care. Refused negative pressure wound therapy. Also refused home health home visits wound deteriorated and was admitted to the hospital for further debridement and antibiotics. Patient was discharged home with wound vac. Home health is coming to the patient's home. She is compliant with offloading shoe. Wound cultures yielded yeast. She was sent home on Cipro  and flagyl  for 6 weeks and posaconazole  for 6 months. Today wound measures 10.5x3.5x1.5. there is healthy granulation tissue within the wound bed. Patient is asking for Cipro  as she was under the impression she was to be on it for several months.  Patient is also asking for repeat wound culture.     Current Outpatient Medications   Medication Sig    ACCU-CHEK GUIDE GLUCOSE METER Does not apply Misc Use three times daily with lancets and test strips    ACCU-CHEK SOFTCLIX LANCETS Misc USE TO TEST BLOOD SUGAR THREE TIMES DAILY    albuterol  sulfate (PROVENTIL ) 2.5 mg /3 mL (0.083 %) Inhalation nebulizer solution Take 3 mL (2.5 mg total) by nebulization Three times a day as needed for Wheezing    albuterol  sulfate (VENTOLIN  HFA) 90 mcg/actuation Inhalation oral inhaler INHALE 1-2 PUFFS BY INHALATION EVERY 6 HOURS AS NEEDED     amitriptyline  (ELAVIL ) 50 mg Oral Tablet Take 1 Tablet (50 mg total) by mouth Every night    aspirin  81 mg Oral Tablet, Chewable Chew 1 Tablet (81 mg total) Daily    azelastine  (ASTELIN ) 137 mcg (0.1 %) Nasal Spray, Non-Aerosol ADMINISTER 1 SPRAY INTO EACH NOSTRIL TWICE DAILY FOR 30 DAYS    betamethasone  valerate (VALISONE ) 0.1 % Ointment Apply topically Twice daily (Patient taking differently: Apply 1 Each topically Twice daily. AREAS OF DRY SKIN  Morning and evening  Indications: OTHER)    Blood Sugar Diagnostic (ACCU-CHEK GUIDE TEST STRIPS) Does not apply Strip USE 1 STRIP TO CHECK BLOOD SUGAR FOUR TIMES DAILY BEFORE MEALS AND AT BEDTIME    budesonide -formoteroL  (SYMBICORT ) 160-4.5 mcg/actuation Inhalation oral inhaler Take 2 Puffs by inhalation Twice daily    ciprofloxacin  HCl (CIPRO ) 500 mg Oral Tablet Take 1 Tablet (500 mg total) by mouth Twice daily for 7 days Indications: morning and evening    clonazePAM  (KLONOPIN ) 0.5 mg Oral Tablet Take 1 Tablet (0.5 mg total) by mouth Twice daily    EPINEPHrine  0.3 mg/0.3 mL Injection Auto-Injector Inject 0.3 mL (0.3 mg total) into the muscle Once, as needed for up to 2 doses    fluticasone  propionate (FLONASE ) 50 mcg/actuation Nasal Spray, Suspension Administer 2 Sprays into each nostril Twice daily    furosemide  (LASIX ) 40  mg Oral Tablet Take 1 Tablet (40 mg total) by mouth Daily    gabapentin  (NEURONTIN ) 800 mg Oral Tablet Take 1/2 tablet by mouth in the morning and afternoon, then take 1 tablet in the evening.    Ibuprofen  (MOTRIN ) 200 mg Oral Tablet Take 4 Tablets (800 mg total) by mouth Four times a day as needed for Pain    insulin  aspart U-100 (NOVOLOG ) 100 unit/mL (3 mL) Subcutaneous Insulin  Pen Inject 20 Units under the skin Twice a day before meals per Sliding Scale. <150 No injection, 150-200 2 units, 201-250 4 units, 251-300 6 units, 301-350 9 units, 351-400 12 units    Insulin  Needles, Disposable, (SURE COMFORT PEN NEEDLE) 32 gauge x 1/4 Needle USE TO  INJECT INSULIN  TWICE DAILY    ipratropium (ATROVENT ) 0.02 % Inhalation Solution Take 1.25 mL (0.25 mg total) by nebulization Every 6 hours    itraconazole  (SPORONAX) 100 mg Oral Capsule Take 2 Capsules (200 mg total) by mouth Daily for 180 days    meloxicam  (MOBIC ) 15 mg Oral Tablet Take 1 Tablet (15 mg total) by mouth Every night    metroNIDAZOLE  (FLAGYL ) 500 mg Oral Tablet Take 1 Tablet (500 mg total) by mouth Twice daily for 42 days    mineral oil-hydrophil petrolat (AQUAPHOR) Ointment Apply topically Four times a day as needed    NaCl-Zn Ac-Vit B6-Citric Acid (WOUND CLEANSER) Irrigation Spray, Non-Aerosol Irrigate with 1 Spray as directed Once per day as needed (with dressing changes)    Nebulizers Misc Use with Nebulizer medication    pantoprazole  (PROTONIX ) 40 mg Oral Tablet, Delayed Release (E.C.) Take 1 Tablet (40 mg total) by mouth Twice daily    posaconazole  (NOXAFIL ) 100 mg Oral Tablet, Delayed Release (E.C.) Take 3 Tablets (300 mg total) by mouth Daily for 180 days    rosuvastatin  (CRESTOR ) 20 mg Oral Tablet Take 1 Tablet (20 mg total) by mouth Every evening    sennosides-docusate sodium  (SENOKOT-S) 8.6-50 mg Oral Tablet Take 1 Tablet by mouth Twice daily for 30 days    SITagliptin  phosphate (JANUVIA ) 100 mg Oral Tablet Take 1 Tablet (100 mg total) by mouth Once a day    SUMAtriptan  (IMITREX ) 50 mg Oral Tablet TAKE 1 TABLET BY MOUTH ONCE DAILY AS NEEDED FOR MIGRAINES MAY REPEAT IN 2 HOURS IF NEEDED FOR 30 DAYS DO NOT EXCEED MORE THAN 9 DOSES IN A 30 DAY PERIOD    vitamin B complex  Oral Tablet Take 1 Tablet by mouth Daily     Allergies[1]    Past Medical History:   Diagnosis Date    Asthma     COPD (chronic obstructive pulmonary disease)     Diabetes mellitus, type 2     Gastroparesis     GERD (gastroesophageal reflux disease)     Hypertension     MRSA (methicillin resistant staph aureus) culture positive 10/10/2022    Right buttock wound         Past Surgical History:   Procedure Laterality Date    FOOT  AMPUTATION      right toe and side of foot    HX HYSTERECTOMY  2012    Left ooperectomy, R. ovary retained    HX LAP CHOLECYSTECTOMY  05/12/2017    PB COLONOSCOPY,DIAGNOSTIC           Family Medical History:    None         Social History[2]    Review of  Systems:  Constitutional Positive for: Malaise/Fatigue, Diaphoresis  Constitutional Negative for: Fever, Chills, Weight Loss, Weakness  Skin Positive for : Rash, Itching  Skin Negative for: Lesion, Mole, Changing lesion, Growing lesion, Painful lesion, Lesion drainage, Ulcers, Mass  HENT Positive for: Headaches, Tinnitus, Congestion, Stridor  HENT Negative for: Hearing Loss, Ear Pain, Ear Discharge, Nosebleeds, Sore Throat  Eyes Positive for: Blurred Vision, Photophobia  Eyes Negative for: Double Vision, Eye Pain, Eye Discharge, Eye Redness  Cardiovascular Positive for: Claudication, Leg Swelling  Cardiovascular Negative for: Chest Pain, Palpitations, Orthopnea, PND  Respiratory Positive for: Cough, Sputum Production, Shortness of Breath, Wheezing  Respiratory Negative for: Hemoptysis     Gastrointestinal Negative for: Heartburn, Nausea, Vomiting, Abdominal Pain, Diarrhea, Constipation, Blood in Stool, Melena, Dysphagia  Genitourinary Positive for: Urgency  Genitourinary Negative for: Dysuria, Frequency, Hematuria, Flank Pain  Musculoskeletal: Myalgias, Neck Pain, Back Pain, Joint Pain  Musculoskeletal Negative for: Falls  Endo/Heme/Allergy Positive for: Env Allergies, Polydipsia  Endo/Heme/Allergy Negative for: Easy Bruise/Bleed  Neurological Positive for: Tingling  Neurological Negative for : Dizziness, Tremor, Sensory Change, Speech Change, Focal Weakness, Seizures, LOC  Psychiatric Positive for: Nervous/Anxious, Insomnia  Psychological Negative for: Depression, Suicidal Ideas, Substance Abuse, Hallucinations, Memory Loss                          Objective:   Vitals: BP (!) 162/84   Pulse 92   Temp 36.8 C (98.2 F)   Ht 1.651 m (5' 5)   Wt 133 kg (293 lb)    SpO2 96%   BMI 48.76 kg/m         General Exam:    General:  appears chronically ill  Lungs:  Breathing nonlabored  Cardiovascular:  regular rate and rhythm, S1, S2 normal, no murmur, click, rub or gallop  Extremities:  see pictures   Neurologic: Grossly normal. Alert and oriented x3  Vascular    pulses 1+ throughout        Ancillary Tests Reviewed:          ENCOUNTER DIAGNOSES     ICD-10-CM   1. Diabetic foot infection (CMS HCC)  (CMS HCC)  E11.628    L08.9   2. Acute osteomyelitis (CMS HCC)  M86.10       Assessment     Right diabetic foot infection   S/p Right foot exploration.  Further debridement excisional with removal of nonviable skin subcutaneous tissue fat fascia muscle tendon.           Plan     I contact Dr. Taft and Darryle Dadds PA directly. Patient is to finish Cipro  and flagyl  tomorrow 12/23. Will go ahead and call in one week of Cipro  as patient finished her antibiotic on Sunday. There is no need for further wound cultures at this time. Will coordinate next follow up appt with Dr. Vikki office. Dr. Vikki office directly contacted to reschedule patient's appt.   Will follow up with patient in 3 weeks  Patient to start back at wound care for week serial debridements       Orders Placed This Encounter    NaCl-Zn Ac-Vit B6-Citric Acid (WOUND CLEANSER) Irrigation Spray, Non-Aerosol    ciprofloxacin  HCl (CIPRO ) 500 mg Oral Tablet       Return in about 3 weeks (around 02/05/2024).    Charina Fons, APRN, CNP    This note was partially generated using MModal Fluency Direct system, and there may be some incorrect words, spellings, and punctuation that were not noted in checking  the note before saving.    Dr. Rod has personally spoken with and examined the patient in the room as well as had a conversation about the treatment plan with Liane Tribbey, APRN, CNP         [1]   Allergies  Allergen Reactions    Beeswax      Allergic to Bee Stings    Other      Nutrasweet, sugar subs    Splenda  [Sucralose]      ALL SUGAR SUBSTITUTES      Sulfa (Sulfonamides)      HIVES    Theophylline      ANXIETY    Hydrocodone Itching   [2]   Social History  Tobacco Use    Smoking status: Every Day     Current packs/day: 0.50     Average packs/day: 0.5 packs/day for 15.0 years (7.5 ttl pk-yrs)     Types: Cigarettes    Smokeless tobacco: Never   Vaping Use    Vaping status: Never Used   Substance Use Topics    Alcohol use: Not Currently     Comment: seldom    Drug use: Not Currently     Types: Marijuana     Comment: 2017

## 2024-01-17 ENCOUNTER — Other Ambulatory Visit: Payer: Self-pay

## 2024-01-17 ENCOUNTER — Other Ambulatory Visit: Payer: MEDICAID

## 2024-01-17 NOTE — Home Health (Signed)
 aox4, vitals and assessment completed. lungs cta, bowel sounds x4. Patient seen Dr Adeniyi on Monday and states she will start following with [facility].  Sister has completed w/d on non nursing days without difficulty.  Wound care completed per order utilizing 1 piece of black foam to wound bed and 1 piece to bridge--cushioned vac tubing to prevent skin irritation.  Reviewed leak alarms, canister change with patient/sister. Encouraged increase in protein for wound healing. Discussed s/s of infection--encouraged good hand washing. Reviewed pressure relieving techniques and encouraged repositioning every 1-2 hours.  Reviewed Diabetes Management including fsbs, s/s of hyper and hypo glycemia and treatment of both.  Discussed prolonged effects of hyperglycemia.  Instructed on daily log and safe disposal of supplies. Encouraged ADA diet. fsbs 70-200.  Reviewed HTN management including blood pressure monitoring, diet: including low sodium, low caffeine, low fat.  Reviewed meds, fall precautions, how to contact [facility] and emergent care plan.  next visit: wc

## 2024-01-18 ENCOUNTER — Other Ambulatory Visit: Payer: Self-pay

## 2024-01-18 LAB — AFB CULTURE WITH STAIN
AFB CULTURE: NO GROWTH
AFB SMEAR: NEGATIVE

## 2024-01-19 ENCOUNTER — Other Ambulatory Visit: Payer: MEDICAID

## 2024-01-19 NOTE — Home Health (Signed)
 Patient identified by two factors, alert and oriented x4, pleasant affect denies pain or discomfort, VS WNL, head to toe assessment completed, heart rate regular, lung sounds clear to auscultation, continent of bowel and bladder, bowel sounds positive in all four quads, voiding without difficulty, appetite good, no signs of edema observed, completed wound care as ordered, reviewed medications, educated pt on diabetes, wound care, s/s of infection, infection control, and to call office with any changes, questions, or concerns, pt voiced understanding. Patient remains homebound d/t Needs assistance of at least one other person to leave the home d/t need for use of offloading shoe, careplan for next visit is to complete wound care, assessment, and pt education.

## 2024-01-20 ENCOUNTER — Other Ambulatory Visit: Payer: Self-pay

## 2024-01-22 ENCOUNTER — Other Ambulatory Visit: Payer: Self-pay

## 2024-01-22 NOTE — Home Health (Signed)
 Patient identified by two factors, alert and oriented x4, pleasant affect denies pain or discomfort, VS WNL, head to toe assessment completed, heart rate regular, lung sounds clear to auscultation, continent of bowel and bladder, bowel sounds positive in all four quads, voiding without difficulty, appetite good, no signs of edema observed, completed wound care as ordered, reviewed medications, educated pt on diabetes, wound care, s/s of infection, infection control, and to call office with any changes, questions, or concerns, pt voiced understanding. Patient remains homebound d/t Needs assistance of at least one other person to leave the home d/t need for use of offloading shoe, careplan for next visit is to complete wound care, assessment, and pt education.

## 2024-01-23 ENCOUNTER — Ambulatory Visit (HOSPITAL_BASED_OUTPATIENT_CLINIC_OR_DEPARTMENT_OTHER): Payer: Self-pay | Admitting: Internal Medicine

## 2024-01-24 ENCOUNTER — Other Ambulatory Visit: Payer: MEDICAID

## 2024-01-24 ENCOUNTER — Other Ambulatory Visit: Payer: Self-pay

## 2024-01-24 NOTE — Home Health (Signed)
 aox4, vitals and assessment completed. lungs cta, bowel sounds x4, bilateral lower ext trace edema.  homebound status: Needs assistance of at least one other person to leave the home d/t need for use of offloading shoe.  Wound care completed per order. Tolerated well.  Has f/u with [facility] on Friday.  Encouraged increase in protein for wound healing. Discussed s/s of infection--encouraged good hand washing. Reviewed pressure relieving techniques and encouraged repositioning every 1-2 hours.     Reviewed Diabetes Management including fsbs, s/s of hyper and hypo glycemia and treatment of both.  Discussed prolonged effects of hyperglycemia.  Instructed on daily log and safe disposal of supplies. Encouraged ADA diet. fsbs 249.  Reviewed HTN management including blood pressure monitoring, diet: including low sodium, low caffeine, low fat.   Reviewed meds, fall precautions, how to contact [facility] and emergent care plan.  next visit: vac

## 2024-01-25 ENCOUNTER — Other Ambulatory Visit: Payer: Self-pay

## 2024-01-26 ENCOUNTER — Other Ambulatory Visit: Payer: Self-pay

## 2024-01-26 ENCOUNTER — Ambulatory Visit: Payer: MEDICAID | Attending: FAMILY PRACTICE | Admitting: FAMILY PRACTICE

## 2024-01-26 ENCOUNTER — Encounter (HOSPITAL_BASED_OUTPATIENT_CLINIC_OR_DEPARTMENT_OTHER): Payer: Self-pay | Admitting: FAMILY PRACTICE

## 2024-01-26 ENCOUNTER — Other Ambulatory Visit: Payer: MEDICAID

## 2024-01-26 DIAGNOSIS — T8131XA Disruption of external operation (surgical) wound, not elsewhere classified, initial encounter: Secondary | ICD-10-CM | POA: Insufficient documentation

## 2024-01-26 DIAGNOSIS — E11621 Type 2 diabetes mellitus with foot ulcer: Secondary | ICD-10-CM

## 2024-01-26 DIAGNOSIS — Z794 Long term (current) use of insulin: Secondary | ICD-10-CM | POA: Insufficient documentation

## 2024-01-26 DIAGNOSIS — E11628 Type 2 diabetes mellitus with other skin complications: Secondary | ICD-10-CM | POA: Insufficient documentation

## 2024-01-26 DIAGNOSIS — S91301A Unspecified open wound, right foot, initial encounter: Secondary | ICD-10-CM | POA: Insufficient documentation

## 2024-01-26 DIAGNOSIS — L089 Local infection of the skin and subcutaneous tissue, unspecified: Secondary | ICD-10-CM | POA: Insufficient documentation

## 2024-01-26 DIAGNOSIS — R600 Localized edema: Secondary | ICD-10-CM | POA: Insufficient documentation

## 2024-01-26 DIAGNOSIS — L97818 Non-pressure chronic ulcer of other part of right lower leg with other specified severity: Secondary | ICD-10-CM

## 2024-01-26 DIAGNOSIS — L0889 Other specified local infections of the skin and subcutaneous tissue: Secondary | ICD-10-CM

## 2024-01-26 DIAGNOSIS — Z7984 Long term (current) use of oral hypoglycemic drugs: Secondary | ICD-10-CM | POA: Insufficient documentation

## 2024-01-26 NOTE — Nursing Note (Signed)
 01/26/24 1016   OTHER   Cleansed with saline   Primary dressing dakins   Secondary dressing dry gauze;Kling   Location right lateral foot   Other wound vac to be resumed by home health

## 2024-01-26 NOTE — Nursing Note (Signed)
 01/26/24 0900   Wound  Diabetic Ulcer Right;Lateral Foot   Initial Date of Wound Assessment/Initial Time of Wound Assessment: 01/26/24 0948   Present on Original Admission: No  Wound Approximate Age at First Assessment (Weeks): 16 weeks  Primary Wound Type: Diabetic Ulcer  Wound Number [facility]): 1  Orien...   LDAW Image    Diabetic Foot Ulcer Stage Diabetic Foot Ulcer Wagner 3   Wound Thickness Full Thickness   State of Healing Non-healing   Periwound Maceration;Callus   Wound Edges Thickened   Wound Bed granulation;fibrin   Wound Bed Additional Information 0%-25% fibrin;51%-75% granulation   Wound Length (cm) 11.3 cm   Wound Width (cm) 2.7 cm   Wound Depth (cm) 2.2 cm   Wound Volume (cm^3) 35.145 cm^3   Wound Surface Area (cm^2) 23.96 cm^2   Wound Undermining No   Wound Tunneling  No   Wound Drainage Amount Large   Exudate/Drainage Serosanguineous;Tan   Wound Odor Mild   Arrival Dressing Type   (wound vac)

## 2024-01-26 NOTE — Nursing Note (Signed)
 01/26/24 0900   Lower Extremity Assessment   LLE Temperature warm   LLE Color no abnormal color   LLE Sensation tingling present;numbness present   LLE Capillary refill equal to/less than 3 secs   LLE Hair present Yes   LLE Edema Non-Pitting   LLE Varicosities No   RLE Temperature warm   RLE Color no abnormal color   RLE Sensation numbness present;tingling present   RLE Claudication No   RLE Hair present Yes   RLE Capillary refill equal to/less than 3 secs   RLE Edema +1   RLE Varicosities No   Lower Extremity Measurements   Point of Measurement 30   Left Calf Circumference (cm) 0.47 m (1' 6.5)   Right Calf Circumference (cm) 0.45 m (1' 5.72)   Right Dorsalis Pedis Pulse Doppler   Left Dorsalis Pedis Pulse Doppler   Right Posterior Tibial Pulse Doppler   Left Posterior Tibial Pulse Doppler

## 2024-01-27 MED ORDER — LIDOCAINE HCL 2 % MUCOSAL JELLY: 5.0000 mL | Freq: Once | 0 refills | Status: DC

## 2024-01-27 NOTE — H&P (Addendum)
 [facility]  [address] [place] [id number]    Progress Note    Patient Name: Yvette Schmidt   MRN:  [id number]   DOB: Mar 05, 1975   Date of Service:  01/26/2024     PCP: Ripley Carmell Puller, MD  Referring provider: Rod Norleen LABOR, MD  Information Provided by: Patient  Chief Complaint:  Diabetic Ulcer    History of Present Illness:   Patient is a 49 y.o. White female who presents for a new evaluation of an open surgical wound of the right lateral foot.   The patient has history of poorly controlled type 2 diabetes, with a recent hemoglobin A1C of 11.6.  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  She refused admission initially back in early November, eventually had to be admitted due to the worsening condition and required open right foot 5th ray amputation, drainage of right foot abscess, excisional debridement and washout on 12/05/2023.  Then on 12/20/2023, she required further debridement with removal of nonviable skin subcutaneous tissue, fat, fascia, muscle and tendon.  She was noted to be non-compliant at home and refused skilled nursing facility care, as well as refusing negative pressure wound therapy and home health and that led to her second admission and surgical procedure.  She now has the wound vac, but the wound has every appearance that she has been walking on the foot and the wound vac has been breaking seal due to this.  She has pretty good granulation tissue in the upper part of the wound, but is developing a macerated flap down below.  We had a long discussion, including her sister who tries to help take care of her needs.  Yvette Schmidt states that she must walk as she has to take care of her husband, but sister states that she is the one taking care of him.  Re-iterated to Yvette Schmidt that her foot will not heal if she continues to walk around on it.  She continues on Noxafil , but states she has completed her Cipro  and flagyl .  Yvette Schmidt is very emotionally  out of control today, crying about her pain and says she has no pain control.  She has multiple issues that she reports that prevent her from taking care of herself.  I'm going to try to order some Santyl to be applied prior to wound vac.    No change in PMH, PSH, Meds, Allergies or FHx since last visit.    Past Medical/Family/Social History:    PMH/PSH:  Past Medical History:   Diagnosis Date    Asthma     COPD (chronic obstructive pulmonary disease)     Diabetes mellitus, type 2     Gastroparesis     GERD (gastroesophageal reflux disease)     Hypertension     MRSA (methicillin resistant staph aureus) culture positive 10/10/2022    Right buttock wound           Medications:  Current Outpatient Medications   Medication Sig    ACCU-CHEK GUIDE GLUCOSE METER Does not apply Misc Use three times daily with lancets and test strips    ACCU-CHEK SOFTCLIX LANCETS Misc USE TO TEST BLOOD SUGAR THREE TIMES DAILY    albuterol  sulfate (PROVENTIL ) 2.5 mg /3 mL (0.083 %) Inhalation nebulizer solution Take 3 mL (2.5 mg total) by nebulization Three times a day as needed for Wheezing    albuterol  sulfate (VENTOLIN  HFA) 90 mcg/actuation Inhalation oral inhaler INHALE  1-2 PUFFS BY INHALATION EVERY 6 HOURS AS NEEDED    amitriptyline  (ELAVIL ) 50 mg Oral Tablet Take 1 Tablet (50 mg total) by mouth Every night    aspirin  81 mg Oral Tablet, Chewable Chew 1 Tablet (81 mg total) Daily    azelastine  (ASTELIN ) 137 mcg (0.1 %) Nasal Spray, Non-Aerosol ADMINISTER 1 SPRAY INTO EACH NOSTRIL TWICE DAILY FOR 30 DAYS    betamethasone  valerate (VALISONE ) 0.1 % Ointment Apply topically Twice daily (Patient taking differently: Apply 1 Each topically Twice daily. AREAS OF DRY SKIN  Morning and evening  Indications: OTHER)    Blood Sugar Diagnostic (ACCU-CHEK GUIDE TEST STRIPS) Does not apply Strip USE 1 STRIP TO CHECK BLOOD SUGAR FOUR TIMES DAILY BEFORE MEALS AND AT BEDTIME    budesonide -formoteroL  (SYMBICORT ) 160-4.5 mcg/actuation Inhalation oral inhaler  Take 2 Puffs by inhalation Twice daily    cephalexin  (KEFLEX ) 500 mg Oral Capsule     clonazePAM  (KLONOPIN ) 0.5 mg Oral Tablet Take 1 Tablet (0.5 mg total) by mouth Twice daily    EPINEPHrine  0.3 mg/0.3 mL Injection Auto-Injector Inject 0.3 mL (0.3 mg total) into the muscle Once, as needed for up to 2 doses    fluticasone  propionate (FLONASE ) 50 mcg/actuation Nasal Spray, Suspension Administer 2 Sprays into each nostril Twice daily    furosemide  (LASIX ) 40 mg Oral Tablet Take 1 Tablet (40 mg total) by mouth Daily    gabapentin  (NEURONTIN ) 800 mg Oral Tablet Take 1/2 tablet by mouth in the morning and afternoon, then take 1 tablet in the evening.    Ibuprofen  (MOTRIN ) 200 mg Oral Tablet Take 4 Tablets (800 mg total) by mouth Four times a day as needed for Pain    insulin  aspart U-100 (NOVOLOG ) 100 unit/mL (3 mL) Subcutaneous Insulin  Pen Inject 20 Units under the skin Twice a day before meals per Sliding Scale. <150 No injection, 150-200 2 units, 201-250 4 units, 251-300 6 units, 301-350 9 units, 351-400 12 units    Insulin  Needles, Disposable, (SURE COMFORT PEN NEEDLE) 32 gauge x 1/4 Needle USE TO INJECT INSULIN  TWICE DAILY    ipratropium (ATROVENT ) 0.02 % Inhalation Solution Take 1.25 mL (0.25 mg total) by nebulization Every 6 hours    itraconazole  (SPORONAX) 100 mg Oral Capsule Take 2 Capsules (200 mg total) by mouth Daily for 180 days    meloxicam  (MOBIC ) 15 mg Oral Tablet Take 1 Tablet (15 mg total) by mouth Every night    mineral oil-hydrophil petrolat (AQUAPHOR) Ointment Apply topically Four times a day as needed    NaCl-Zn Ac-Vit B6-Citric Acid (WOUND CLEANSER) Irrigation Spray, Non-Aerosol Irrigate with 1 Spray as directed Once per day as needed (with dressing changes)    Nebulizers Misc Use with Nebulizer medication    pantoprazole  (PROTONIX ) 40 mg Oral Tablet, Delayed Release (E.C.) Take 1 Tablet (40 mg total) by mouth Twice daily    posaconazole  (NOXAFIL ) 100 mg Oral Tablet, Delayed Release (E.C.) Take 3  Tablets (300 mg total) by mouth Daily for 180 days    rosuvastatin  (CRESTOR ) 20 mg Oral Tablet Take 1 Tablet (20 mg total) by mouth Every evening    sennosides-docusate sodium  (SENOKOT-S) 8.6-50 mg Oral Tablet Take 1 Tablet by mouth Twice daily for 30 days    SITagliptin  phosphate (JANUVIA ) 100 mg Oral Tablet Take 1 Tablet (100 mg total) by mouth Once a day    SUMAtriptan  (IMITREX ) 50 mg Oral Tablet TAKE 1 TABLET BY MOUTH ONCE DAILY AS NEEDED FOR MIGRAINES MAY REPEAT IN 2 HOURS  IF NEEDED FOR 30 DAYS DO NOT EXCEED MORE THAN 9 DOSES IN A 30 DAY PERIOD    vitamin B complex  Oral Tablet Take 1 Tablet by mouth Daily       Allergies:  Allergies[1]    Family History:  Family Medical History:    None           Social History:  Social History     Tobacco Use    Smoking status: Every Day     Current packs/day: 0.50     Average packs/day: 0.5 packs/day for 15.0 years (7.5 ttl pk-yrs)     Types: Cigarettes    Smokeless tobacco: Never   Substance Use Topics    Alcohol use: Not Currently     Comment: seldom     Smoking cessation counseling this appointment: .yes 2 minutes on smoking cessation, she refuses at this time    Review of Systems :    Constitutional: negative for fevers, chills,fatigue, and weight loss  Respiratory: negative for cough, wheezing  Cardiovascular: negative for chest pressure/discomfort, negative claudication, positive lower extremity edema  Gastrointestinal: negative for dysphagia, nausea, vomiting, diarrhea, decrease in appetite   Integumentary: negative for rash, changes in skin color, dryness; + skin lesion per wound chart  Musculoskeletal: negative for myalgias, muscle weakness, positive joint pain  Neurological: negative for dizziness, gait/coordination problems, positive paresthesia  All other systems negative.    Physical Exam:    BP (!) 180/110 Comment: patient very anxious  Pulse 100   Temp 36.4 C (97.5 F)   Resp 18        Constitutional: BMI 48.75, in poor general health,  ambulatory appears   in acute distress, crying and emotional.  Eyes: PERRL, conjunctiva non-icteric  HENT: mucous membranes moist  Respiratory: non-labored on room air  Cardiovascular: 1+ lower extremity edema, doppler obtainable bilateral dorsal pedis pulses, absent cyanosis,   Neurologic: A&Ox3; grossly intact; sensation is diminished  Musculoskeletal: Equal strength to bilateral upper/lower extremities, head normocephalic   Psychiatric: Very emotional affect; cooperative with exam but very demanding.  Integumentary: ,See wound chart and photo, no hemosiderin staining noted, no erythema, no xerosis, no lipodermatosclerosis, no s/s of infection; see detailed wound charting below:      Neurovascular Assessment:Noted above      WOUND ASSESSMENT     Nursing Notes:   Yvette Fang, RN  01/26/24 9046  Signed     01/26/24 0900   Lower Extremity Assessment   LLE Temperature warm   LLE Color no abnormal color   LLE Sensation tingling present;numbness present   LLE Capillary refill equal to/less than 3 secs   LLE Hair present Yes   LLE Edema Non-Pitting   LLE Varicosities No   RLE Temperature warm   RLE Color no abnormal color   RLE Sensation numbness present;tingling present   RLE Claudication No   RLE Hair present Yes   RLE Capillary refill equal to/less than 3 secs   RLE Edema +1   RLE Varicosities No   Lower Extremity Measurements   Point of Measurement 30   Left Calf Circumference (cm) 0.47 m (1' 6.5)   Right Calf Circumference (cm) 0.45 m (1' 5.72)   Right Dorsalis Pedis Pulse Doppler   Left Dorsalis Pedis Pulse Doppler   Right Posterior Tibial Pulse Doppler   Left Posterior Tibial Pulse Doppler         Yvette Fang, RN  01/26/24 0957  Signed     01/26/24 0900  Wound  Diabetic Ulcer Right;Lateral Foot   Initial Date of Wound Assessment/Initial Time of Wound Assessment: 01/26/24 0948   Present on Original Admission: No  Wound Approximate Age at First Assessment (Weeks): 16 weeks  Primary Wound Type: Diabetic Ulcer  Wound Number [facility]): 1  Orien...   LDAW Image    Diabetic Foot Ulcer Stage Diabetic Foot Ulcer Wagner 3   Wound Thickness Full Thickness   State of Healing Non-healing   Periwound Maceration;Callus   Wound Edges Thickened   Wound Bed granulation;fibrin   Wound Bed Additional Information 0%-25% fibrin;51%-75% granulation   Wound Length (cm) 11.3 cm   Wound Width (cm) 2.7 cm   Wound Depth (cm) 2.2 cm   Wound Volume (cm^3) 35.145 cm^3   Wound Surface Area (cm^2) 23.96 cm^2   Wound Undermining No   Wound Tunneling  No   Wound Drainage Amount Large   Exudate/Drainage Serosanguineous;Tan   Wound Odor Mild   Arrival Dressing Type   (wound vac)         Yvette Maus, RN  01/26/24 1017  Signed     01/26/24 1016   OTHER   Cleansed with saline   Primary dressing dakins   Secondary dressing dry gauze;Kling   Location right lateral foot   Other wound vac to be resumed by home health                               Data / Labs / Imaging Studies:      Previous progress notes and lab results reviewed up to date in medical chart.     Labs: Following reviewed and discussed with patient  ALBUMIN (g/dL)   Date Value   87/97/7974 3.2 (L)   12/07/2023 2.7 (L)     No results found for: PREALBUMIN  HEMOGLOBIN A1C (%)   Date Value   12/05/2023 11.6 (H)   09/06/2023 10.1 (H)           Assessment / Plan:     ENCOUNTER DIAGNOSES     ICD-10-CM   1. Diabetic foot infection (CMS HCC)  (CMS HCC)  E11.628    L08.9   2. Open wound of right foot  S91.301A         Off-loading:Surgical shoe      Dressing: Wound Treatment    Cleansed with: saline  Primary dressing: dakins     Secondary dressing: dry gauze, Kling              Location: right lateral foot  Other: wound vac to be resumed by home health         Compression Wraps:NA        - Continue prescribed medication regimen as directed by PCP. Medical optimization of diabetes with PCP.     - Counseled patient on importance of nutrition to support granulation tissue growth and wound     healing  --Instructed  patient on foods high in Vitamin A, Vitamin C  and protein      -Instructed patient if diabetic on the importance of tight glycemic control with a goal HgbA1C < 7.0    - RTC in 1 week or sooner if needed. May call with any additional questions/concerns prior to next appointment. Patient instructed to call or report to Emergency Department if fever develops, wound increases in size, wound drainage increases, pain is uncontrolled, edema increases or erythema develops.  Devere Sayres, MD 01/29/2024, 14:41    Wound Care    This note was partially generated using MModal Fluency Direct System.There maybe some incorrect words, spelling, and punctuation that were not noted in checking the note before saving.                [1]   Allergies  Allergen Reactions    Beeswax      Allergic to Bee Stings    Other      Nutrasweet, sugar subs    Splenda [Sucralose]      ALL SUGAR SUBSTITUTES      Sulfa (Sulfonamides)      HIVES    Theophylline      ANXIETY    Hydrocodone Itching

## 2024-01-29 ENCOUNTER — Other Ambulatory Visit: Payer: MEDICAID

## 2024-01-29 NOTE — Home Health (Signed)
 Patient identified by two factors, alert and oriented x4, pleasant affect denies pain or discomfort, VS WNL, head to toe assessment completed, heart rate regular, lung sounds clear to auscultation, continent of bowel and bladder, bowel sounds positive in all four quads, voiding without difficulty, appetite good, no signs of edema observed, completed wound care as ordered, reviewed medications, educated pt on diabetes, wound care, s/s of infection, infection control, and to call office with any changes, questions, or concerns, pt voiced understanding. Patient remains homebound d/t Needs assistance of at least one other person to leave the home d/t need for use of offloading shoe, careplan for next visit is to complete wound care, assessment, and pt education.

## 2024-01-31 ENCOUNTER — Other Ambulatory Visit: Payer: MEDICAID

## 2024-01-31 NOTE — Case Communication (Signed)
 [facility] ordered 2 cases black foam and 2 cases of canisters. deliver date [facility] 02/01/24  ref: [id number]

## 2024-01-31 NOTE — Home Health (Signed)
 Patient identified by two factors, alert and oriented x4, pleasant affect denies pain or discomfort, VS WNL, head to toe assessment completed, heart rate regular, lung sounds clear to auscultation, continent of bowel and bladder, bowel sounds positive in all four quads, voiding without difficulty, appetite good, no signs of edema observed, completed wound care as ordered, reviewed medications, educated pt on diabetes, COPD, wound care, s/s of infection, infection control, and to call office with any changes, questions, or concerns, pt voiced understanding. Patient remains homebound d/t Needs assistance of at least one other person to leave the home d/t need for use of offloading shoe, careplan for next visit is to complete wound care, assessment, and pt education.

## 2024-02-01 ENCOUNTER — Other Ambulatory Visit: Payer: Self-pay

## 2024-02-01 ENCOUNTER — Other Ambulatory Visit (INDEPENDENT_AMBULATORY_CARE_PROVIDER_SITE_OTHER): Payer: Self-pay

## 2024-02-01 ENCOUNTER — Telehealth (INDEPENDENT_AMBULATORY_CARE_PROVIDER_SITE_OTHER): Payer: MEDICAID

## 2024-02-01 DIAGNOSIS — F329 Major depressive disorder, single episode, unspecified: Secondary | ICD-10-CM

## 2024-02-01 MED ORDER — INSULIN ASPART (U-100) 100 UNIT/ML (3 ML) SUBCUTANEOUS PEN: 20.0000 [IU] | PEN_INJECTOR | Freq: Two times a day (BID) | SUBCUTANEOUS | 0 refills | Status: DC

## 2024-02-01 MED ORDER — LANTUS SOLOSTAR U-100 INSULIN 100 UNIT/ML (3 ML) SUBCUTANEOUS PEN: 20.0000 [IU] | PEN_INJECTOR | Freq: Every evening | SUBCUTANEOUS | 0 refills | Status: AC

## 2024-02-01 MED ORDER — OXYCODONE 5 MG TABLET: 10.0000 mg | ORAL_TABLET | ORAL | 0 refills | Status: DC | PRN

## 2024-02-01 MED ORDER — GABAPENTIN 800 MG TABLET: 800.0000 mg | ORAL_TABLET | Freq: Three times a day (TID) | ORAL | 0 refills | Status: AC

## 2024-02-02 ENCOUNTER — Other Ambulatory Visit (INDEPENDENT_AMBULATORY_CARE_PROVIDER_SITE_OTHER): Payer: Self-pay | Admitting: Internal Medicine

## 2024-02-02 ENCOUNTER — Other Ambulatory Visit (INDEPENDENT_AMBULATORY_CARE_PROVIDER_SITE_OTHER): Payer: Self-pay | Admitting: Medical

## 2024-02-02 ENCOUNTER — Other Ambulatory Visit: Payer: MEDICAID

## 2024-02-02 NOTE — Home Health (Signed)
 Patient identified by two factors, alert and oriented x4, pleasant affect denies pain or discomfort, VS WNL, head to toe assessment completed, heart rate regular, lung sounds clear to auscultation, continent of bowel and bladder, bowel sounds positive in all four quads, voiding without difficulty, appetite good, no signs of edema observed, completed wound care as ordered, reviewed medications, educated pt on diabetes, COPD, wound care, s/s of infection, infection control, and to call office with any changes, questions, or concerns, pt voiced understanding. Patient remains homebound d/t Needs assistance of at least one other person to leave the home d/t need for use of offloading shoe, careplan for next visit is to complete wound care, assessment, and pt education.

## 2024-02-03 MED ORDER — INSULIN ASPART (U-100) 100 UNIT/ML (3 ML) SUBCUTANEOUS PEN: 20.0000 [IU] | PEN_INJECTOR | Freq: Two times a day (BID) | SUBCUTANEOUS | 0 refills | Status: AC

## 2024-02-05 ENCOUNTER — Other Ambulatory Visit: Payer: Self-pay

## 2024-02-05 ENCOUNTER — Other Ambulatory Visit: Payer: MEDICAID

## 2024-02-05 NOTE — Home Health (Signed)
 Patient identified by two factors, alert and oriented x4, pleasant affect denies pain or discomfort, VS WNL, head to toe assessment completed, heart rate regular, lung sounds clear to auscultation, continent of bowel and bladder, bowel sounds positive in all four quads, voiding without difficulty, appetite good, no signs of edema observed, completed wound care as ordered, reviewed medications, educated pt on diabetes, COPD, wound care, s/s of infection, infection control, and to call office with any changes, questions, or concerns, pt voiced understanding. Patient remains homebound d/t Needs assistance of at least one other person to leave the home d/t need for use of offloading shoe, careplan for next visit is to complete wound care, assessment, and pt education.

## 2024-02-06 ENCOUNTER — Other Ambulatory Visit (INDEPENDENT_AMBULATORY_CARE_PROVIDER_SITE_OTHER): Payer: Self-pay | Admitting: Internal Medicine

## 2024-02-06 ENCOUNTER — Encounter (HOSPITAL_BASED_OUTPATIENT_CLINIC_OR_DEPARTMENT_OTHER): Payer: Self-pay

## 2024-02-06 MED ORDER — CLONAZEPAM 0.5 MG TABLET: 0.5000 mg | ORAL_TABLET | Freq: Two times a day (BID) | ORAL | 0 refills | Status: AC

## 2024-02-06 NOTE — Telephone Encounter (Signed)
 Shoulders, Rock GORMAN Gull, Margherita, MA  Pt needs a refill on her Klonapin please  [facility]

## 2024-02-06 NOTE — Telephone Encounter (Signed)
 Patient has yet to read MyChart message.  Called patient to clarify medication(s).  Left message on voicemail requesting return call.  Naomie Ernst, MA  02/06/2024, 08:45

## 2024-02-06 NOTE — Telephone Encounter (Signed)
 I spoke to [facility] with [facility] to clarify what medication was picked up.  She reports Itraconazole  remains on hold but Posaconazole  was picked up by the patient on 12/29/2023.  [facility] states Posaconazole  is not available for refill until late February 2026.  I requested Itraconazole  Rx be cancelled as patient's insurance will not cover this medication.    I spoke to the patient and clarified that she is still taking the Posaconale.  Patient confirmed that she is still taking this medication and explained that she must have sent MyChart refill requests by mistake.  Patient went on to say she is having severe back pain and it's because of the infection throughout her body and she needs to speak with Dr. Taft.  I made the patient aware that she will need to contact her PCP regarding back pain as Dr. Taft is treating her diabetic foot infection but I will gladly send a message to Dr. Taft.  Patient became very irate stating I want to speak to Dr. Taft NOW and I am tired of being pushed around from doctor to doctor, this is bullshit and I will cancel every appointment I have and report you!.  I did attempt to explain to the patient, once again, that Dr. Taft is not in clinic right now but I could send him a message.  Patient continued to yell at me saying it will be my fault when she dies because I didn't let her talk to Dr. Taft and she will see what she can do about me refusing to take care of her.  I offered to move 02/08/2024 clinic visit to tomorrow, 02/07/2024.  However patient again yelled at me saying that I am refusing to provide care to her and it will be all my fault when she dies because I didn't allow her to talk to Dr. Taft.  Patient eventually hung up the phone on me.  Dr. Taft made aware of the above conversation and confirmed that patient would need to go to the ED or contact PCP regarding back pain.  Naomie Ernst, MA  02/06/2024, 14:14

## 2024-02-07 ENCOUNTER — Other Ambulatory Visit: Payer: Self-pay

## 2024-02-07 ENCOUNTER — Encounter (HOSPITAL_BASED_OUTPATIENT_CLINIC_OR_DEPARTMENT_OTHER): Payer: Self-pay | Admitting: Internal Medicine

## 2024-02-07 NOTE — Home Health (Signed)
 Patient identified by two factors, alert and oriented x4, pleasant affect denies pain or discomfort, VS WNL, head to toe assessment completed, heart rate regular, lung sounds clear to auscultation, continent of bowel and bladder, bowel sounds positive in all four quads, voiding without difficulty, appetite good, no signs of edema observed, completed wound care as ordered, reviewed medications, educated pt on diabetes, COPD, wound care, s/s of infection, infection control, and to call office with any changes, questions, or concerns, pt voiced understanding. Patient remains homebound d/t Needs assistance of at least one other person to leave the home d/t need for use of offloading shoe, careplan for next visit is to complete wound care, assessment, and pt education.

## 2024-02-08 ENCOUNTER — Other Ambulatory Visit: Payer: Self-pay

## 2024-02-08 ENCOUNTER — Other Ambulatory Visit (HOSPITAL_BASED_OUTPATIENT_CLINIC_OR_DEPARTMENT_OTHER): Payer: Self-pay | Admitting: Internal Medicine

## 2024-02-08 ENCOUNTER — Ambulatory Visit (HOSPITAL_COMMUNITY): Payer: MEDICAID

## 2024-02-08 ENCOUNTER — Ambulatory Visit (HOSPITAL_BASED_OUTPATIENT_CLINIC_OR_DEPARTMENT_OTHER): Payer: MEDICAID | Admitting: Internal Medicine

## 2024-02-08 ENCOUNTER — Ambulatory Visit
Admission: RE | Admit: 2024-02-08 | Discharge: 2024-02-08 | Disposition: A | Discharge status: Home / Self Care | Payer: MEDICAID | Source: Ambulatory Visit | Attending: Vascular & Interventional Radiology | Admitting: Vascular & Interventional Radiology

## 2024-02-08 ENCOUNTER — Ambulatory Visit (INDEPENDENT_AMBULATORY_CARE_PROVIDER_SITE_OTHER): Payer: Self-pay | Admitting: Vascular & Interventional Radiology

## 2024-02-08 ENCOUNTER — Encounter (INDEPENDENT_AMBULATORY_CARE_PROVIDER_SITE_OTHER): Payer: Self-pay | Admitting: Vascular & Interventional Radiology

## 2024-02-08 ENCOUNTER — Other Ambulatory Visit (INDEPENDENT_AMBULATORY_CARE_PROVIDER_SITE_OTHER): Payer: Self-pay | Admitting: Vascular & Interventional Radiology

## 2024-02-08 ENCOUNTER — Ambulatory Visit (HOSPITAL_BASED_OUTPATIENT_CLINIC_OR_DEPARTMENT_OTHER): Payer: Self-pay | Admitting: Internal Medicine

## 2024-02-08 ENCOUNTER — Encounter (HOSPITAL_COMMUNITY): Payer: Self-pay | Admitting: Vascular & Interventional Radiology

## 2024-02-08 ENCOUNTER — Ambulatory Visit (INDEPENDENT_AMBULATORY_CARE_PROVIDER_SITE_OTHER): Payer: Self-pay | Admitting: Primary Care

## 2024-02-08 ENCOUNTER — Telehealth (HOSPITAL_COMMUNITY): Payer: Self-pay | Admitting: Internal Medicine

## 2024-02-08 VITALS — BP 144/86 | HR 107 | Temp 97.4°F | Ht 65.0 in

## 2024-02-08 DIAGNOSIS — Z792 Long term (current) use of antibiotics: Secondary | ICD-10-CM | POA: Insufficient documentation

## 2024-02-08 DIAGNOSIS — E1142 Type 2 diabetes mellitus with diabetic polyneuropathy: Secondary | ICD-10-CM | POA: Insufficient documentation

## 2024-02-08 DIAGNOSIS — L089 Local infection of the skin and subcutaneous tissue, unspecified: Secondary | ICD-10-CM

## 2024-02-08 DIAGNOSIS — S91301D Unspecified open wound, right foot, subsequent encounter: Secondary | ICD-10-CM

## 2024-02-08 DIAGNOSIS — Z01818 Encounter for other preprocedural examination: Secondary | ICD-10-CM

## 2024-02-08 DIAGNOSIS — E11628 Type 2 diabetes mellitus with other skin complications: Secondary | ICD-10-CM | POA: Insufficient documentation

## 2024-02-08 DIAGNOSIS — M869 Osteomyelitis, unspecified: Secondary | ICD-10-CM | POA: Insufficient documentation

## 2024-02-08 LAB — CBC WITH DIFF
BASOPHIL #: <0.1 x10ˆ3/uL (ref <=0.20)
BASOPHIL %: 0.4 %
EOSINOPHIL #: 0.13 x10ˆ3/uL (ref <=0.50)
EOSINOPHIL %: 1 %
HCT: 52.9 % — ABNORMAL HIGH (ref 34.8–46.0)
HGB: 17.7 g/dL — ABNORMAL HIGH (ref 11.5–16.0)
IMMATURE GRANULOCYTE #: <0.1 x10ˆ3/uL (ref <0.10)
IMMATURE GRANULOCYTE %: 0.4 % (ref 0.0–1.0)
LYMPHOCYTE #: 2.9 x10ˆ3/uL (ref 1.00–4.80)
LYMPHOCYTE %: 22.3 %
MCH: 29.9 pg (ref 26.0–32.0)
MCHC: 33.5 g/dL (ref 31.0–35.5)
MCV: 89.4 fL (ref 78.0–100.0)
MONOCYTE #: 0.71 x10ˆ3/uL (ref 0.20–1.10)
MONOCYTE %: 5.5 %
MPV: 12.1 fL (ref 8.7–12.5)
NEUTROPHIL #: 9.14 x10ˆ3/uL — ABNORMAL HIGH (ref 1.50–7.70)
NEUTROPHIL %: 70.4 %
PLATELETS: 183 x10ˆ3/uL (ref 150–400)
RBC: 5.92 x10ˆ6/uL — ABNORMAL HIGH (ref 3.85–5.22)
RDW-CV: 15.3 % (ref 11.5–15.5)
WBC: 13 x10ˆ3/uL — ABNORMAL HIGH (ref 3.7–11.0)

## 2024-02-08 LAB — COMPREHENSIVE METABOLIC PANEL, NON-FASTING
ALBUMIN: 3.5 g/dL (ref 3.5–5.0)
ALKALINE PHOSPHATASE: 145 U/L — ABNORMAL HIGH (ref 40–110)
ALT (SGPT): 12 U/L (ref <31)
ANION GAP: 11 mmol/L (ref 4–13)
AST (SGOT): 13 U/L (ref 11–34)
BILIRUBIN TOTAL: 0.3 mg/dL (ref 0.3–1.3)
BUN/CREA RATIO: 11 (ref 6–22)
BUN: 8 mg/dL (ref 8–25)
CALCIUM: 9.3 mg/dL (ref 8.6–10.2)
CHLORIDE: 97 mmol/L (ref 96–111)
CO2 TOTAL: 27 mmol/L (ref 22–30)
CREATININE: 0.75 mg/dL (ref 0.60–1.05)
GLUCOSE: 326 mg/dL — ABNORMAL HIGH (ref 65–125)
POTASSIUM: 4.4 mmol/L (ref 3.5–5.1)
PROTEIN TOTAL: 6.6 g/dL (ref 6.0–7.9)
SODIUM: 135 mmol/L — ABNORMAL LOW (ref 136–145)
eGFRcr - FEMALE: >90 mL/min/1.73mˆ2 (ref >=60)

## 2024-02-08 LAB — CARBON DIOXIDE (CO2, BICARBONATE): CO2 TOTAL: 27 mmol/L (ref 22–30)

## 2024-02-08 LAB — C-REACTIVE PROTEIN(CRP),INFLAMMATION: CRP INFLAMMATION: 12.8 mg/L — ABNORMAL HIGH (ref <8.0)

## 2024-02-08 LAB — PT/INR: INR: 1.05 (ref 0.80–1.10)

## 2024-02-08 MED ORDER — NICOTINE 7 MG/24 HR DAILY TRANSDERMAL PATCH: 7.0000 mg | MEDICATED_PATCH | Freq: Every day | TRANSDERMAL | 0 refills | Status: DC

## 2024-02-08 NOTE — Progress Notes (Cosign Needed)
 NOTE

## 2024-02-08 NOTE — Progress Notes (Signed)
 [facility]  [address]  [place] [address]  Phone: [phone]  Fax: [phone]      Encounter Date: 02/08/2024    Patient ID:  Yvette Schmidt  FMW:[id number]    DOB: 09-Mar-1975  Age: 49 y.o. female    Subjective:     Chief Complaint   Patient presents with    Follow Up     Room 1= 3 week wound check       HPI  Yvette Schmidt is a 49 y.o. female who was seen today for 3 week follow up. Patient is status post redo Right foot exploration.   Patient was discharged home with wound vac. Home health is coming to the patient's home. She is compliant with offloading shoe. She is currently on posaconazole  for 6 months. Today wound measures 11cm with bone exposure.  there is healthy granulation tissue within the wound bed. However there is nonviable skin flap to base of wound bed.     Current Outpatient Medications   Medication Sig    ACCU-CHEK GUIDE GLUCOSE METER Does not apply Misc Use three times daily with lancets and test strips    ACCU-CHEK SOFTCLIX LANCETS Misc USE TO TEST BLOOD SUGAR THREE TIMES DAILY    albuterol  sulfate (PROVENTIL ) 2.5 mg /3 mL (0.083 %) Inhalation nebulizer solution Take 3 mL (2.5 mg total) by nebulization Three times a day as needed for Wheezing    albuterol  sulfate (VENTOLIN  HFA) 90 mcg/actuation Inhalation oral inhaler INHALE 1-2 PUFFS BY INHALATION EVERY 6 HOURS AS NEEDED    amitriptyline  (ELAVIL ) 50 mg Oral Tablet Take 1 Tablet (50 mg total) by mouth Every night    aspirin  81 mg Oral Tablet, Chewable Chew 1 Tablet (81 mg total) Daily    azelastine  (ASTELIN ) 137 mcg (0.1 %) Nasal Spray, Non-Aerosol ADMINISTER 1 SPRAY INTO EACH NOSTRIL TWICE DAILY FOR 30 DAYS    betamethasone  valerate (VALISONE ) 0.1 % Ointment Apply topically Twice daily (Patient taking differently: Apply 1 Each topically Twice daily AREAS OF DRY SKIN  Morning and evening Indications: OTHER)    Blood Sugar Diagnostic (ACCU-CHEK GUIDE TEST STRIPS) Does not apply Strip USE 1 STRIP  TO CHECK BLOOD SUGAR FOUR TIMES DAILY BEFORE MEALS AND AT BEDTIME    budesonide -formoteroL  (SYMBICORT ) 160-4.5 mcg/actuation Inhalation oral inhaler Take 2 Puffs by inhalation Twice daily    cephalexin  (KEFLEX ) 500 mg Oral Capsule  (Patient not taking: Reported on 02/08/2024)    clonazePAM  (KLONOPIN ) 0.5 mg Oral Tablet Take 1 Tablet (0.5 mg total) by mouth Twice daily for 30 days    EPINEPHrine  0.3 mg/0.3 mL Injection Auto-Injector Inject 0.3 mL (0.3 mg total) into the muscle Once, as needed for up to 2 doses    fluticasone  propionate (FLONASE ) 50 mcg/actuation Nasal Spray, Suspension Administer 2 Sprays into each nostril Twice daily    furosemide  (LASIX ) 40 mg Oral Tablet Take 1 Tablet (40 mg total) by mouth Daily    gabapentin  (NEURONTIN ) 800 mg Oral Tablet Take 1 Tablet (800 mg total) by mouth Three times a day Take 1/2 tablet by mouth in the morning and afternoon, then take 1 tablet in the evening.    Ibuprofen  (MOTRIN ) 200 mg Oral Tablet Take 4 Tablets (800 mg total) by mouth Four times a day as needed for Pain    insulin  aspart U-100 (NOVOLOG ) 100 unit/mL (3 mL) Subcutaneous Insulin  Pen Inject 20 Units under the skin Twice a day before meals  insulin  glargine (LANTUS  SOLOSTAR U-100 INSULIN ) 100 unit/mL Subcutaneous Insulin  Pen Inject 20 Units under the skin Every night    Insulin  Needles, Disposable, (SURE COMFORT PEN NEEDLE) 32 gauge x 1/4 Needle USE TO INJECT INSULIN  TWICE DAILY    ipratropium (ATROVENT ) 0.02 % Inhalation Solution Take 1.25 mL (0.25 mg total) by nebulization Every 6 hours    lactulose  (ENULOSE ) 10 gram/15 mL Oral Solution Take 30 mL by mouth Daily for 30 days    lidocaine  (XYLOCAINE ) 2 % Mucous Membrane jelly Apply 5 mL topically One time for 1 dose    linaCLOtide  (LINZESS ) 290 mcg Oral Capsule Take 1 Capsule (290 mcg total) by mouth Every morning (Patient taking differently: Take 1 Capsule (290 mcg total) by mouth Every other day)    meloxicam  (MOBIC ) 15 mg Oral Tablet TAKE 1 TABLET (15  MG TOTAL) BY MOUTH EVERY NIGHT    metroNIDAZOLE  (FLAGYL ) 250 mg Oral Tablet Take 1 Tablet (250 mg total) by mouth Twice daily    mineral oil-hydrophil petrolat (AQUAPHOR) Ointment Apply topically Four times a day as needed    NaCl-Zn Ac-Vit B6-Citric Acid (WOUND CLEANSER) Irrigation Spray, Non-Aerosol Irrigate with 1 Spray as directed Once per day as needed (with dressing changes)    Nebulizers Misc Use with Nebulizer medication    oxyCODONE  (ROXICODONE ) 5 mg Oral Tablet Take 2 Tablets (10 mg total) by mouth Every 4 hours as needed for up to 3 days    pantoprazole  (PROTONIX ) 40 mg Oral Tablet, Delayed Release (E.C.) TAKE 1 TABLET (40 MG TOTAL) BY MOUTH TWICE DAILY    posaconazole  (NOXAFIL ) 100 mg Oral Tablet, Delayed Release (E.C.) Take 3 Tablets (300 mg total) by mouth Daily for 180 days    rosuvastatin  (CRESTOR ) 20 mg Oral Tablet Take 1 Tablet (20 mg total) by mouth Every evening    sennosides-docusate sodium  (SENOKOT-S) 8.6-50 mg Oral Tablet Take 1 Tablet by mouth Twice daily for 30 days    SITagliptin  phosphate (JANUVIA ) 100 mg Oral Tablet Take 1 Tablet (100 mg total) by mouth Once a day    SUMAtriptan  (IMITREX ) 50 mg Oral Tablet TAKE 1 TABLET BY MOUTH ONCE DAILY AS NEEDED FOR MIGRAINES MAY REPEAT IN 2 HOURS IF NEEDED FOR 30 DAYS DO NOT EXCEED MORE THAN 9 DOSES IN A 30 DAY PERIOD    vitamin B complex  Oral Tablet Take 1 Tablet by mouth Daily (Patient not taking: Reported on 02/08/2024)     Allergies[1]    Past Medical History:   Diagnosis Date    Asthma     Convulsions     COPD (chronic obstructive pulmonary disease)     Diabetes mellitus, type 2     Gastroparesis     GERD (gastroesophageal reflux disease)     Hypertension     MRSA (methicillin resistant staph aureus) culture positive 10/10/2022    Right buttock wound    Type 2 diabetes mellitus     Wears glasses          Past Surgical History:   Procedure Laterality Date    FOOT AMPUTATION      right toe and side of foot    HX HYSTERECTOMY  2012    Left  ooperectomy, R. ovary retained    HX LAP CHOLECYSTECTOMY  05/12/2017    PB COLONOSCOPY,DIAGNOSTIC           Family Medical History:    None         Social History[2]    Review of  Systems:  Constitutional Positive for: Malaise/Fatigue  Constitutional Negative for: Weight Loss, Chills, Diaphoresis, Weakness  Skin Positive for : Rash  Skin Negative for: Itching, Lesion, Mass, Mole, Changing lesion, Growing lesion, Painful lesion, Lesion drainage, Ulcers  HENT Positive for: Stridor  HENT Negative for: Sore Throat, Congestion, Nosebleeds, Ear Discharge, Ear Pain, Tinnitus, Hearing Loss, Headaches     Eyes Negative for: Blurred Vision, Photophobia, Double Vision, Eye Pain, Eye Discharge, Eye Redness     Cardiovascular Negative for: Chest Pain, Palpitations, Orthopnea, Claudication, Leg Swelling, PND  Respiratory Positive for: Cough, Shortness of Breath, Wheezing  Respiratory Negative for: Hemoptysis, Sputum Production     Gastrointestinal Negative for: Heartburn, Nausea, Vomiting, Abdominal Pain, Diarrhea, Constipation, Blood in Stool, Melena, Dysphagia  Genitourinary Positive for: Urgency, Frequency  Genitourinary Negative for: Hematuria, Flank Pain, Dysuria  Musculoskeletal: Neck Pain, Joint Pain, Back Pain  Musculoskeletal Negative for: Falls, Myalgias  Endo/Heme/Allergy Positive for: Env Allergies  Endo/Heme/Allergy Negative for: Easy Bruise/Bleed, Polydipsia  Neurological Positive for: Tingling  Neurological Negative for : Dizziness, Tremor, Sensory Change, Speech Change, Focal Weakness, Seizures, LOC  Psychiatric Positive for: Nervous/Anxious, Insomnia  Psychological Negative for: Memory Loss, Hallucinations, Substance Abuse, Suicidal Ideas, Depression                          Objective:   Vitals: BP (!) 144/86 (Patient Position: Sitting)   Pulse (!) 107   Temp 36.3 C (97.4 F)   Ht 1.651 m (5' 5)   SpO2 92%   BMI 48.76 kg/m         General Exam:    General:  appears chronically ill  Lungs:  Breathing  nonlabored  Cardiovascular:  regular rate and rhythm, S1, S2 normal, no murmur, click, rub or gallop  Extremities:  see pictures   Neurologic: Grossly normal. Alert and oriented x3  Vascular    pulses 1+ throughout        Ancillary Tests Reviewed:          ENCOUNTER DIAGNOSES     ICD-10-CM   1. Diabetic foot infection (CMS HCC)  (CMS HCC)  E11.628    L08.9   2. Open wound of right foot, subsequent encounter  S91.301D       Assessment     Right diabetic foot infection   S/p Right foot exploration.  Further debridement excisional with removal of nonviable skin subcutaneous tissue fat fascia muscle tendon.           Plan     Patient will be scheduled for right foot debridement   Risks benefits alternatives surgical intervention were explained the patient  All questions asked and answered  Appropriate consents have been obtained  I contacted Dr Taft regarding patient's upcoming appointment later today. Case and surgical plan was discussed with him. She does not need to see him as she is going back to the OR next week and will have repeat cultures. Patient will have coordinated follow up appointments with vascular and ID.       Orders Placed This Encounter    [facility] (AMB ONLY)       No follow-ups on file.    Toneisha Savary, APRN, CNP    This note was partially generated using MModal Fluency Direct system, and there may be some incorrect words, spellings, and punctuation that were not noted in checking the note before saving.    Dr. Adeniyi has personally spoken with and  examined the patient in the room as well as had a conversation about the treatment plan with Mary Hockey, APRN, CNP         [1]   Allergies  Allergen Reactions    Aspartame     Beeswax      Allergic to Bee Stings    Other      Nutrasweet, sugar subs    Splenda [Sucralose]      ALL SUGAR SUBSTITUTES      Sulfa (Sulfonamides)      HIVES    Theophylline      ANXIETY    Hydrocodone Itching   [2]   Social History  Tobacco Use     Smoking status: Every Day     Current packs/day: 0.50     Average packs/day: 0.5 packs/day for 15.0 years (7.5 ttl pk-yrs)     Types: Cigarettes    Smokeless tobacco: Never   Vaping Use    Vaping status: Never Used   Substance Use Topics    Alcohol use: Not Currently     Comment: seldom    Drug use: Not Currently     Types: Marijuana     Comment: 2017

## 2024-02-09 ENCOUNTER — Other Ambulatory Visit: Payer: MEDICAID

## 2024-02-09 ENCOUNTER — Ambulatory Visit (HOSPITAL_BASED_OUTPATIENT_CLINIC_OR_DEPARTMENT_OTHER): Payer: MEDICAID | Admitting: FAMILY PRACTICE

## 2024-02-09 LAB — HGA1C (HEMOGLOBIN A1C WITH EST AVG GLUCOSE)
ESTIMATED AVERAGE GLUCOSE: 312 mg/dL
HEMOGLOBIN A1C: 12.5 % — ABNORMAL HIGH (ref 4.1–5.7)

## 2024-02-09 NOTE — Home Health (Signed)
 Patient identified by two factors, alert and oriented x4, pleasant affect denies pain or discomfort, VS WNL, head to toe assessment completed, heart rate regular, lung sounds clear to auscultation, continent of bowel and bladder, bowel sounds positive in all four quads, voiding without difficulty, appetite good, no signs of edema observed, completed wound care as ordered, reviewed medications, educated pt on diabetes, COPD, wound care, s/s of infection, infection control, and to call office with any changes, questions, or concerns, pt voiced understanding. Patient remains homebound d/t Needs assistance of at least one other person to leave the home d/t need for use of offloading shoe, careplan for next visit is to complete wound care, assessment, and pt education.

## 2024-02-12 ENCOUNTER — Ambulatory Visit
Admission: RE | Admit: 2024-02-12 | Discharge: 2024-02-12 | Disposition: A | Discharge status: Home / Self Care | Payer: MEDICAID | Source: Ambulatory Visit | Attending: Vascular & Interventional Radiology | Admitting: Vascular & Interventional Radiology

## 2024-02-12 ENCOUNTER — Other Ambulatory Visit: Payer: Self-pay

## 2024-02-12 ENCOUNTER — Encounter (HOSPITAL_COMMUNITY)
Admission: RE | Disposition: A | Discharge status: Home / Self Care | Payer: Self-pay | Source: Ambulatory Visit | Attending: Vascular & Interventional Radiology

## 2024-02-12 ENCOUNTER — Encounter (HOSPITAL_COMMUNITY): Payer: Self-pay | Admitting: Vascular & Interventional Radiology

## 2024-02-12 ENCOUNTER — Ambulatory Visit (HOSPITAL_COMMUNITY): Payer: Self-pay

## 2024-02-12 ENCOUNTER — Encounter (HOSPITAL_COMMUNITY): Payer: Self-pay

## 2024-02-12 DIAGNOSIS — Z89421 Acquired absence of other right toe(s): Secondary | ICD-10-CM | POA: Insufficient documentation

## 2024-02-12 DIAGNOSIS — L089 Local infection of the skin and subcutaneous tissue, unspecified: Secondary | ICD-10-CM | POA: Insufficient documentation

## 2024-02-12 DIAGNOSIS — Z794 Long term (current) use of insulin: Secondary | ICD-10-CM | POA: Insufficient documentation

## 2024-02-12 DIAGNOSIS — L7682 Other postprocedural complications of skin and subcutaneous tissue: Secondary | ICD-10-CM

## 2024-02-12 DIAGNOSIS — Z7984 Long term (current) use of oral hypoglycemic drugs: Secondary | ICD-10-CM | POA: Insufficient documentation

## 2024-02-12 DIAGNOSIS — I96 Gangrene, not elsewhere classified: Secondary | ICD-10-CM

## 2024-02-12 DIAGNOSIS — E11628 Type 2 diabetes mellitus with other skin complications: Secondary | ICD-10-CM | POA: Insufficient documentation

## 2024-02-12 HISTORY — DX: Unspecified convulsions: R56.9

## 2024-02-12 HISTORY — DX: Presence of spectacles and contact lenses: Z97.3

## 2024-02-12 HISTORY — DX: Major depressive disorder, single episode, unspecified: F32.9

## 2024-02-12 HISTORY — DX: Type 2 diabetes mellitus without complications: E11.9

## 2024-02-12 LAB — POC BLOOD GLUCOSE (RESULTS)
GLUCOSE, POC: 229 mg/dL — ABNORMAL HIGH (ref 70–110)
GLUCOSE, POC: 229 mg/dL — ABNORMAL HIGH (ref 70–110)

## 2024-02-12 MED ORDER — HYDROMORPHONE (PF) 0.5 MG/0.5 ML INJECTION SYRINGE
0.5000 mg | INJECTION | INTRAMUSCULAR | Status: DC | PRN
  Administered 2024-02-12: 0.5 mg via INTRAVENOUS
  Filled 2024-02-12: qty 0.5

## 2024-02-12 MED ORDER — ONDANSETRON HCL (PF) 4 MG/2 ML INJECTION SOLUTION: 4.0000 mg | Freq: Once | INTRAMUSCULAR | Status: DC | PRN

## 2024-02-12 MED ORDER — MIDAZOLAM 1 MG/ML INJECTION WRAPPER
INTRAMUSCULAR | Status: AC
  Filled 2024-02-12: qty 2

## 2024-02-12 MED ORDER — KETAMINE 10 MG/ML INJECTION WRAPPER
INTRAMUSCULAR | Status: AC
  Filled 2024-02-12: qty 5

## 2024-02-12 MED ORDER — PROPOFOL 10 MG/ML IV - CHI
INTRAVENOUS | Status: DC | PRN
  Administered 2024-02-12: 50 ug/kg/min via INTRAVENOUS
  Administered 2024-02-12: 0 ug/kg/min via INTRAVENOUS
  Administered 2024-02-12: 100 ug/kg/min via INTRAVENOUS

## 2024-02-12 MED ORDER — DEXMEDETOMIDINE 4 MCG/ML IV DILUTION
Freq: Once | INTRAMUSCULAR | Status: DC | PRN
  Administered 2024-02-12: 10 ug via INTRAVENOUS

## 2024-02-12 MED ORDER — FENTANYL (PF) 50 MCG/ML INJECTION WRAPPER: 25.0000 ug | INJECTION | INTRAMUSCULAR | Status: DC | PRN

## 2024-02-12 MED ORDER — SODIUM CHLORIDE 0.9 % IRRIGATION SOLUTION: Freq: Once | Status: DC | PRN

## 2024-02-12 MED ORDER — HYDRALAZINE 20 MG/ML INJECTION SOLUTION: 5.0000 mg | INTRAMUSCULAR | Status: DC | PRN

## 2024-02-12 MED ORDER — CEFAZOLIN 3 GRAM INTRAVENOUS SOLUTION
INTRAVENOUS | Status: AC
  Filled 2024-02-12: qty 15.31

## 2024-02-12 MED ORDER — KETAMINE 10 MG/ML INJECTION WRAPPER
Freq: Once | INTRAMUSCULAR | Status: DC | PRN
  Administered 2024-02-12: 20 mg via INTRAVENOUS

## 2024-02-12 MED ORDER — DOXYCYCLINE HYCLATE 100 MG CAPSULE
100.0000 mg | ORAL_CAPSULE | Freq: Two times a day (BID) | ORAL | 0 refills | Status: AC
  Filled 2024-02-12: qty 20, 10d supply, fill #0

## 2024-02-12 MED ORDER — PROCHLORPERAZINE EDISYLATE 10 MG/2 ML (5 MG/ML) INJECTION SOLUTION: 5.0000 mg | Freq: Once | INTRAMUSCULAR | Status: DC | PRN

## 2024-02-12 MED ORDER — SODIUM CHLORIDE 0.9 % (FLUSH) INJECTION SYRINGE: 3.0000 mL | INJECTION | Freq: Three times a day (TID) | INTRAMUSCULAR | Status: DC

## 2024-02-12 MED ORDER — LIDOCAINE HCL 20 MG/ML (2 %) INJECTION SOLUTION
Freq: Once | INTRAMUSCULAR | Status: DC | PRN
  Administered 2024-02-12: 100 mg via INTRAVENOUS

## 2024-02-12 MED ORDER — LACTATED RINGERS INTRAVENOUS SOLUTION
INTRAVENOUS | Status: DC | PRN
  Administered 2024-02-12: 0 via INTRAVENOUS

## 2024-02-12 MED ORDER — PROPOFOL 10 MG/ML IV BOLUS
INJECTION | INTRAVENOUS | Status: AC
  Filled 2024-02-12: qty 50

## 2024-02-12 MED ORDER — IPRATROPIUM 0.5 MG-ALBUTEROL 3 MG (2.5 MG BASE)/3 ML NEBULIZATION SOLN: 3.0000 mL | INHALATION_SOLUTION | Freq: Once | RESPIRATORY_TRACT | Status: DC | PRN

## 2024-02-12 MED ORDER — MIDAZOLAM 1 MG/ML INJECTION WRAPPER
Freq: Once | INTRAMUSCULAR | Status: DC | PRN
  Administered 2024-02-12: 2 mg via INTRAVENOUS

## 2024-02-12 MED ORDER — LACTATED RINGERS INTRAVENOUS SOLUTION: INTRAVENOUS | Status: DC

## 2024-02-12 MED ORDER — FENTANYL (PF) 50 MCG/ML INJECTION SOLUTION
INTRAMUSCULAR | Status: AC
  Filled 2024-02-12: qty 2

## 2024-02-12 MED ORDER — LIDOCAINE HCL 10 MG/ML (1 %) INJECTION SOLUTION
INTRAMUSCULAR | Status: AC
  Filled 2024-02-12: qty 20

## 2024-02-12 MED ORDER — FENTANYL (PF) 50 MCG/ML INJECTION WRAPPER
INJECTION | Freq: Once | INTRAMUSCULAR | Status: DC | PRN
  Administered 2024-02-12 (×2): 50 ug via INTRAVENOUS

## 2024-02-12 NOTE — Discharge Instructions (Signed)
 Hospital Operator [phone]

## 2024-02-12 NOTE — OR Surgeon (Signed)
 OPERATIVE NOTE    Patient Name: Yvette Schmidt  Age:  49 y.o.  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    Washout and partial closure of wound    Indication:  As noted above          Narrative of Procedure:  Indications contraindications risks benefits and alternative therapies to the procedure were discussed with the patient including the option for a 2nd opinion if deemed appropriate.  Appropriate consents were obtained.  Patient was brought to the operating room. Patient was positioned supine on the operating table Appropriate lines and tubes were placed.  Mac anesthesia was initiated by the anesthesiologist.  Right foot was prepped and draped in sterile fashion standard for the procedure    Using a combination of the scalpel, scissors and curette I sharply debridement of the wound removing areas of flap necrosis in the plantar flap subcutaneous tissue fascia and some of the muscles of the 4th ray to healthy tissue margins.    I irrigated the wound.  I then partially closed the wound with interrupted 2-0 nylon sutures.    The rest of the wound was packed open.  A sterile multilayer dressing was applied.      Patient tolerated the procedure well.  There were no complications.  Port loss was minimal    Findings:  Wound debrided to healthy margins.  Wound measured 12 x 5 x 5 cm prior to partial closure post debridement        Attending Surgeon: Norleen DELENA Robe, MD    Assistant(s): None    Anesthesia Type: General     Estimated Blood Loss:  Minimal    Blood Given: None    Fluids Given: Per Anesthesia Records     Complications (not routinely expected or not inherent to difficulty/nature of  procedure): None    Characteristic Event (routinely expected or inherent to the difficulty/nature of the procedure): None    Did the use of current and/or prior Anticoagulants impact the outcome of the case?no    Wound Class: Contaminated Wounds-Open, fresh, accidental wounds    Tubes: None    Drains: None    Specimens/ Cultures:  All debrided tissue to culture    Implants: None           Disposition: PACU - hemodynamically stable.    Condition: stable    Batina Dougan A Helio Lack, MD1/19/202620:13

## 2024-02-12 NOTE — Anesthesia Postprocedure Evaluation (Signed)
 Anesthesia Post Op Evaluation    Patient: Yvette Schmidt  Procedure(s):  RIGHT FOOT DEBRIDEMENT    Last Vitals:Temperature: (!) 35.9 C (96.6 F) (02/12/24 1300)  Heart Rate: 84 (02/12/24 1300)  BP (Non-Invasive): (!) 144/96 (02/12/24 1300)  Respiratory Rate: 20 (02/12/24 1300)  SpO2: 93 % (02/12/24 1300)    No notable events documented.    Patient is sufficiently recovered from the effects of anesthesia to participate in the evaluation and has returned to their pre-procedure level.  Patient location during evaluation: PACU       Patient participation: complete - patient participated  Level of consciousness: awake and alert and responsive to verbal stimuli    Pain management: adequate  Airway patency: patent    Anesthetic complications: no  Cardiovascular status: acceptable  Respiratory status: acceptable  Hydration status: acceptable  Patient post-procedure temperature: Pt Normothermic   PONV Status: Absent

## 2024-02-12 NOTE — H&P (Signed)
 Yvette Schmidt, Jordan, APRN, CNP   Nurse Practitioner  Specialty: FAMILY NURSE PRACTITIONER     Progress Notes     Signed     Encounter Date: 02/08/2024     Expand All Collapse All    [facility]  [address]  [place] [address]  Phone: [phone]  Fax: [phone]        Encounter Date: 02/08/2024     Patient ID:  Yvette Yvette Schmidt  FMW:[id number]    DOB: 1975/03/02  Age: 49 y.o. female     Subjective:           Chief Complaint   Patient presents with    Follow Up       Room 1= 3 week wound check         HPI  Yvette Yvette Schmidt is a 49 y.o. female who was seen today for 3 week follow up. Patient is status post redo Right foot exploration.   Patient was discharged home with wound vac. Home health is coming to the patient's home. She is compliant with offloading shoe. She is currently on posaconazole  for 6 months. Today wound measures 11cm with bone exposure.  there is healthy granulation tissue within the wound bed. However there is nonviable skin flap to base of wound bed.           Current Outpatient Medications   Medication Sig    ACCU-CHEK GUIDE GLUCOSE METER Does not apply Misc Use three times daily with lancets and test strips    ACCU-CHEK SOFTCLIX LANCETS Misc USE TO TEST BLOOD SUGAR THREE TIMES DAILY    albuterol  sulfate (PROVENTIL ) 2.5 mg /3 mL (0.083 %) Inhalation nebulizer solution Take 3 mL (2.5 mg total) by nebulization Three times a day as needed for Wheezing    albuterol  sulfate (VENTOLIN  HFA) 90 mcg/actuation Inhalation oral inhaler INHALE 1-2 PUFFS BY INHALATION EVERY 6 HOURS AS NEEDED    amitriptyline  (ELAVIL ) 50 mg Oral Tablet Take 1 Tablet (50 mg total) by mouth Every night    aspirin  81 mg Oral Tablet, Chewable Chew 1 Tablet (81 mg total) Daily    azelastine  (ASTELIN ) 137 mcg (0.1 %) Nasal Spray, Non-Aerosol ADMINISTER 1 SPRAY INTO EACH NOSTRIL TWICE DAILY FOR 30 DAYS    betamethasone  valerate (VALISONE ) 0.1 % Ointment Apply topically Twice daily (Patient taking  differently: Apply 1 Each topically Twice daily AREAS OF DRY SKIN  Morning and evening Indications: OTHER)    Blood Sugar Diagnostic (ACCU-CHEK GUIDE TEST STRIPS) Does not apply Strip USE 1 STRIP TO CHECK BLOOD SUGAR FOUR TIMES DAILY BEFORE MEALS AND AT BEDTIME    budesonide -formoteroL  (SYMBICORT ) 160-4.5 mcg/actuation Inhalation oral inhaler Take 2 Puffs by inhalation Twice daily    cephalexin  (KEFLEX ) 500 mg Oral Capsule  (Patient not taking: Reported on 02/08/2024)    clonazePAM  (KLONOPIN ) 0.5 mg Oral Tablet Take 1 Tablet (0.5 mg total) by mouth Twice daily for 30 days    EPINEPHrine  0.3 mg/0.3 mL Injection Auto-Injector Inject 0.3 mL (0.3 mg total) into the muscle Once, as needed for up to 2 doses    fluticasone  propionate (FLONASE ) 50 mcg/actuation Nasal Spray, Suspension Administer 2 Sprays into each nostril Twice daily    furosemide  (LASIX ) 40 mg Oral Tablet Take 1 Tablet (40 mg total) by mouth Daily    gabapentin  (NEURONTIN ) 800 mg Oral Tablet Take 1 Tablet (800 mg total) by mouth Three times a day Take 1/2 tablet by mouth in  the morning and afternoon, then take 1 tablet in the evening.    Ibuprofen  (MOTRIN ) 200 mg Oral Tablet Take 4 Tablets (800 mg total) by mouth Four times a day as needed for Pain    insulin  aspart U-100 (NOVOLOG ) 100 unit/mL (3 mL) Subcutaneous Insulin  Pen Inject 20 Units under the skin Twice a day before meals    insulin  glargine (LANTUS  SOLOSTAR U-100 INSULIN ) 100 unit/mL Subcutaneous Insulin  Pen Inject 20 Units under the skin Every night    Insulin  Needles, Disposable, (SURE COMFORT PEN NEEDLE) 32 gauge x 1/4 Needle USE TO INJECT INSULIN  TWICE DAILY    ipratropium (ATROVENT ) 0.02 % Inhalation Solution Take 1.25 mL (0.25 mg total) by nebulization Every 6 hours    lactulose  (ENULOSE ) 10 gram/15 mL Oral Solution Take 30 mL by mouth Daily for 30 days    lidocaine  (XYLOCAINE ) 2 % Mucous Membrane jelly Apply 5 mL topically One time for 1 dose    linaCLOtide  (LINZESS ) 290 mcg Oral Capsule  Take 1 Capsule (290 mcg total) by mouth Every morning (Patient taking differently: Take 1 Capsule (290 mcg total) by mouth Every other day)    meloxicam  (MOBIC ) 15 mg Oral Tablet TAKE 1 TABLET (15 MG TOTAL) BY MOUTH EVERY NIGHT    metroNIDAZOLE  (FLAGYL ) 250 mg Oral Tablet Take 1 Tablet (250 mg total) by mouth Twice daily    mineral oil-hydrophil petrolat (AQUAPHOR) Ointment Apply topically Four times a day as needed    NaCl-Zn Ac-Vit B6-Citric Acid (WOUND CLEANSER) Irrigation Spray, Non-Aerosol Irrigate with 1 Spray as directed Once per day as needed (with dressing changes)    Nebulizers Misc Use with Nebulizer medication    oxyCODONE  (ROXICODONE ) 5 mg Oral Tablet Take 2 Tablets (10 mg total) by mouth Every 4 hours as needed for up to 3 days    pantoprazole  (PROTONIX ) 40 mg Oral Tablet, Delayed Release (E.C.) TAKE 1 TABLET (40 MG TOTAL) BY MOUTH TWICE DAILY    posaconazole  (NOXAFIL ) 100 mg Oral Tablet, Delayed Release (E.C.) Take 3 Tablets (300 mg total) by mouth Daily for 180 days    rosuvastatin  (CRESTOR ) 20 mg Oral Tablet Take 1 Tablet (20 mg total) by mouth Every evening    sennosides-docusate sodium  (SENOKOT-S) 8.6-50 mg Oral Tablet Take 1 Tablet by mouth Twice daily for 30 days    SITagliptin  phosphate (JANUVIA ) 100 mg Oral Tablet Take 1 Tablet (100 mg total) by mouth Once a day    SUMAtriptan  (IMITREX ) 50 mg Oral Tablet TAKE 1 TABLET BY MOUTH ONCE DAILY AS NEEDED FOR MIGRAINES MAY REPEAT IN 2 HOURS IF NEEDED FOR 30 DAYS DO NOT EXCEED MORE THAN 9 DOSES IN A 30 DAY PERIOD    vitamin B complex  Oral Tablet Take 1 Tablet by mouth Daily (Patient not taking: Reported on 02/08/2024)      [Allergies]    [Allergies]        Allergen Reactions    Aspartame      Beeswax         Allergic to [employer]    Other         Nutrasweet, sugar subs    Splenda [Sucralose]         ALL SUGAR SUBSTITUTES       Sulfa (Sulfonamides)         HIVES    Theophylline         ANXIETY    Hydrocodone Itching             Past  Medical History:    Diagnosis Date    Asthma      Convulsions      COPD (chronic obstructive pulmonary disease)      Diabetes mellitus, type 2      Gastroparesis      GERD (gastroesophageal reflux disease)      Hypertension      MRSA (methicillin resistant staph aureus) culture positive 10/10/2022     Right buttock wound    Type 2 diabetes mellitus      Wears glasses                    Past Surgical History:   Procedure Laterality Date    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 ovary retained    HX LAP CHOLECYSTECTOMY   05/12/2017    PB COLONOSCOPY,DIAGNOSTIC                Family Medical History:       None               [Social History]    [Social History]        Tobacco Use    Smoking status: Every Day       Current packs/day: 0.50       Average packs/day: 0.5 packs/day for 15.0 years (7.5 ttl pk-yrs)       Types: Cigarettes    Smokeless tobacco: Never   Vaping Use    Vaping status: Never Used   Substance Use Topics    Alcohol use: Not Currently       Comment: seldom    Drug use: Not Currently       Types: Marijuana       Comment: 2017        Review of  Systems:  Constitutional Positive for: Malaise/Fatigue  Constitutional Negative for: Weight Loss, Chills, Diaphoresis, Weakness  Skin Positive for : Rash  Skin Negative for: Itching, Lesion, Mass, Mole, Changing lesion, Growing lesion, Painful lesion, Lesion drainage, Ulcers  HENT Positive for: Stridor  HENT Negative for: Sore Throat, Congestion, Nosebleeds, Ear Discharge, Ear Pain, Tinnitus, Hearing Loss, Headaches  Eyes Negative for: Blurred Vision, Photophobia, Double Vision, Eye Pain, Eye Discharge, Eye Redness  Cardiovascular Negative for: Chest Pain, Palpitations, Orthopnea, Claudication, Leg Swelling, PND  Respiratory Positive for: Cough, Shortness of Breath, Wheezing  Respiratory Negative for: Hemoptysis, Sputum Production  Gastrointestinal Negative for: Heartburn, Nausea, Vomiting, Abdominal Pain, Diarrhea,  Constipation, Blood in Stool, Melena, Dysphagia  Genitourinary Positive for: Urgency, Frequency  Genitourinary Negative for: Hematuria, Flank Pain, Dysuria  Musculoskeletal: Neck Pain, Joint Pain, Back Pain  Musculoskeletal Negative for: Falls, Myalgias  Endo/Heme/Allergy Positive for: Env Allergies  Endo/Heme/Allergy Negative for: Easy Bruise/Bleed, Polydipsia  Neurological Positive for: Tingling  Neurological Negative for : Dizziness, Tremor, Sensory Change, Speech Change, Focal Weakness, Seizures, LOC  Psychiatric Positive for: Nervous/Anxious, Insomnia  Psychological Negative for: Memory Loss, Hallucinations, Substance Abuse, Suicidal Ideas, Depression                                      Objective:   Vitals: BP (!) 144/86 (Patient Position: Sitting)   Pulse (!) 107   Temp 36.3 C (97.4 F)   Ht 1.651 m (5' 5)   SpO2  92%   BMI 48.76 kg/m            General Exam:     General:  appears chronically ill  Lungs:  Breathing nonlabored  Cardiovascular:  regular rate and rhythm, S1, S2 normal, no murmur, click, rub or gallop  Extremities:  see pictures   Neurologic: Grossly normal. Alert and oriented x3  Vascular    pulses 1+ throughout          Ancillary Tests Reviewed:                   ENCOUNTER DIAGNOSES       ICD-10-CM   1. Diabetic foot infection (CMS HCC)  (CMS HCC)  E11.628     L08.9   2. Open wound of right foot, subsequent encounter  S91.301D         Assessment      Right diabetic foot infection   S/p Right foot exploration.  Further debridement excisional with removal of nonviable skin subcutaneous tissue fat fascia muscle tendon.               Plan      Patient will be scheduled for right foot debridement   Risks benefits alternatives surgical intervention were explained the patient  All questions asked and answered  Appropriate consents have been obtained  I contacted Dr Taft regarding patient's upcoming appointment later today. Case and surgical plan was discussed with him. She does not need to  see him as she is going back to the OR next week and will have repeat cultures. Patient will have coordinated follow up appointments with vascular and ID.             Orders Placed This Encounter    [facility] WOUND CARE DRESSING (AMB ONLY)         No follow-ups on file.     Yvette Kesler, APRN, CNP     This note was partially generated using MModal Fluency Direct system, and there may be some incorrect words, spellings, and punctuation that were not noted in checking the note before saving.     Dr. Rod has personally spoken with and examined the patient in the room as well as had a conversation about the treatment plan with Yvette Kesler, APRN, CNP         Electronically signed by Yvette Schmidt, Jordan, APRN, CNP at 02/08/24 1449       The history, physical exam was directly supervised by me. The interpretation of ancillary tests was performed by me. I also have  reviewed and confirmed the ROS, Past Medical Histoy, Past Family History, and exam elements performed and documented by the support staff. The scribed portion of the progress note was scribed on my behalf and at my direction.  I have reviewed and attest to the accuracy of the note.    Charles Niese A Annesha Delgreco, MD [facility]   [facility]  H&P Update Form    Rindy, Kollman, 49 y.o. female  Date of Admission:  02/12/2024  Date of Birth:  April 15, 1975    02/12/2024    STOP: IF H&P IS GREATER THAN 30 DAYS FROM SURGICAL DAY COMPLETE NEW H&P IS REQUIRED.     H & P updated the day of the procedure.  1.  H&P completed within 30 days of surgical procedure and has been reviewed within 24 hours of the surgery, the patient has been examined, and no change has occured in the patients condition since  the H&P was completed.       Change in medications: No      Comments:     2.  Patient continues to be appropiate candidate for planned surgical procedure. YES    3.  In addition to the risks associated with this particular procedure that have been explained to the patient as well as the  benefit to the procedure, the patient also understands that at this time there is additional environmental risks associated with the presence of COVID -19 in our hospital. Patient consents to the procedure accepting this additional risk.    Latrisa Hellums A Gavina Dildine, MD

## 2024-02-12 NOTE — Anesthesia Preprocedure Evaluation (Signed)
 ANESTHESIA PRE-OP EVALUATION  Planned Procedure: RIGHT FOOT DEBRIDEMENT (Right)  Review of Systems                   Pulmonary   COPD, asthma and sleep apnea,   Cardiovascular    Hypertension ,       GI/Hepatic/Renal    GERD        Endo/Other      type 2 diabetes    Neuro/Psych/MS    seizures, headaches, depression     Cancer                      Physical Assessment      Airway       Mallampati: II      Mouth Opening: fair.            Dental           (+) loose, poor dentition           Pulmonary           Cardiovascular             Other findings            Plan  ASA 4     Planned anesthesia type: general     total intravenous anesthesia                          Anesthetic plan and risks discussed with patient                      Plan discussed with CRNA.

## 2024-02-13 ENCOUNTER — Other Ambulatory Visit: Payer: MEDICAID

## 2024-02-13 ENCOUNTER — Ambulatory Visit (HOSPITAL_BASED_OUTPATIENT_CLINIC_OR_DEPARTMENT_OTHER): Payer: MEDICAID | Admitting: FAMILY PRACTICE

## 2024-02-13 NOTE — Case Communication (Signed)
 Jordan,     I don't see resumption of wound care orders on the discharge from 02/12/24.  Are we to resume the vac at current settings?     Thanks   Marathon Oil RN  [facility]  [phone]

## 2024-02-13 NOTE — Nursing Note (Signed)
 Services currently provided to patient:  Skilled Nursing    Case Conference Notes:  Patient discussed for case conference date 02/09/24. No issues noted at the time of this case conference.     Summary of Care:  CLINICIAN NAME: MANUELITA POUCH  [facility] DATE: 12/29/23  REFERRAL DATE: 12/27/23  REFERRED TO HH BY: [facility]  ACTIVE PROBLEMS: RIGHT FOOT/ ANKLE OSTEO, DM II, AND COPD.   ACTIVE DISCIPLINES: SN         ADVANCE CARE PLANNING:    DOES PATIENT HAVE CAPACITY?    YES    IF, NO DOES PATIENT HAVE AN ACTIVE MPOA OR HEALTH CARE SURROGATE?   NO      INRUSANCE:    INSURANCE:   MEDICAID    ANY UPCOMING INSURANCE CHANGES?  NO    HOMEBOUND STATUS:    DOES THIS PATIENT DRIVE?  NO    HOW OFTEN DOES PATIENT LEAVE HOME?    APPROX. 1 TIME PER MONTH FOR MD APPOINTMENTS    PATIENT LEAVES HOME FOR:  MD APPOINTMENTS      ASSISTIVE DEVICES USED WHEN LEAVING THE HOME?  OFFLOADING SHOE      SUPPLIES:        ARE WE CURRENLTY ORDERING SUPPLIES FOR PATIENT? YES      VISIT FREQUEN  CY:    SN: 3 TIMES WEEKLY        DOES PATIENT HAVE FREQUENT MISSED VISITS?  N/A SOC    LUPA THRESHOLD FOR 1ST 30 DAYS: SOC  LUPA THRESHOLD FOR 2ND 30 DAYS: SOC        SKILLED NEED:      DESCRIBE THE CURRENT SKILLED NEED:  SN: ASSESSMENTS, MED. MGT.,   AND WOUND VAC CHANGES    IS THIS SERVICES REASONABLE AND MEDICALLY NECESSARY?  YES      WOUNDS:      DOES PATIENT HAVE A WOUND?  YES    HOW LONG HAS THE WOUND BEEN PRESENT?  SOC    IS THE WOUND HEALING?  YES    DOES PATIENT FOLLOW- UP WITH A WOUND CA  RE PROVIDER?  NO    HOW LONG HAVE WE BEEN PERFORMING THE CURRENT DRESSING REGIMEN?   SOC    WHO COMPELTES WOUND CARE IN THE ABSENCE OF SN?  N/A      REHOSPITALIZATION:      WHAT IS THE PATIENT'S RISK FOR REHOSPITALIZATION?     LOW, MODERATE, OR HIGH:   MODERATE D/T CHRONIC HEALTH CONDITIONS, RISK FOR FALLS, AND RISK FOR INFECTION.      HAS THE PATIENT HAD ANY ED VISITS SINCE THE MOST RECENT SOC/ ROC?  N/A SOC    WHY?  N/A    HAS THE PATIENT BEEN ADMITTED TO THE HOSPITAL  SINCE THE MOST RECENT SOC/   ROC?  N/A SOC    IF YES WHY?  N/A      GOALS:      PATIENT'S PRIMARY GOAL FOR HOME HEALTH?  INFECTION PREVENTION, WOUND PREVENTION. GAIN STRENGTH    IS THE GOAL REASONABLE AND ACHIEVABLE?  YES      IS THE PATIENT PROGRESSING TOWARDS THEIR GOALS?  YES          PSYCHOLOGICAL NEEDS:        BREIFLY DESCRIBE PATIENT'S HOME ENVIRONMENT?      CLEAN AND KEPT      SAFETY ISSUES NEEDING ADDRESSED?  NONE      DOES THE PATIENT HAVE A CAREGIVER AND HOW OFTEN ARE THEY PRESENT?  YES PATIENT'S ADULT SISTER/ MOTHE  R ASSIST WITH CARE AS NECESSARY.      DOES THE CAREGIVER NEED ADDITONAL TRAINING AND/ OR EDUCATION?   NO    IS THIS PATIENT ACTIVE WITH POPULATION HEALTH?  NO        IS THIS PATIENT APPROPRIATE FOR THE HOSPICE BRIDGE?  NO      DISCHARGE PLANNING:      ARE THERE ANY DME NEEDS?  NO    SUPPLY NEEDS AT DISCHARGE?  NO    WHERE IS THE PATIENT BEING DISCHARGED TO?  HOME/ INDEPENDENT CARE          MSW NEEDS PRIOR TO DISCHARGE?  NO    Priority Issues:       Discipline Notes:  SN Note Author: JINNY Robie PEAK. Date: 02/05/24.    SOC Date: 12/29/23   Active Problem: RIGHT FOOT/ ANKLE OSTEO, DM II, AND COPD.     Active Disciplines:  SN    Advance Care Planning    Does patient have capacity? yes  If no, does patient have an active MP, OA or health care surrogate? no     Insurance    Any upcoming insurance changes? no    Homebound Status    Does this patient drive? no  How often does patient leave the home? rarely  Patient leaves home for: md apts  Assistive devices used when leavi, ng the home: assist of 1    Supplies    Are we currently ordering supplies for patient? yes  (Facilitator to review supply cost if HH is covering)    Visit Frequency    SN: 3x/wk  PT: na  OT: na  MSW: na    Does patient have frequent missed visits? n, o    LUPA threshold for 1st 30 days: na  LUPA threshold for 2nd 30 days: na    Skilled Need    Describe the current skilled need: woundcare  Is this service reasonable and medically  necessary? yes    Wounds (Nursing Only)    Does the patient have wou, nds(s)? yes  How long has the wound(s) been present? soc  Is this wound healing? Yes  Does patient follow-up with a wound care Provider? Yes  How long have we been performing the current dressing regimen? vac therapy  Who completes wound care in the , absence of SN? na    Rehospitalization    What is the patient's risk of rehospitalization? Moderate  (Refer to thumbnail report)  Has the patient had any ED visits since the most recent SOC/ROC? no  If yes, why?   Has the pateint been admitted to the,  hospital since the most recent SOC/ROC? no  If yes, why?    Goals    Patients primary goal for home health? wound to heal  Is the goal reasonable and achievable? yes  Is the patient progressing towards their goal? yes    Psychosocial Needs    Briefl, y describe patient's home environment: camper  Safety issues needing addressed: falls, infection, meds  Does the patient have a caregiver and how often are they present? y--sister helps in evenings  Does the caregiver need additonal training and/or e, ducation? no  Is this patient active with Population Health? no  Is this patient appropriate for [facility]? no

## 2024-02-14 ENCOUNTER — Other Ambulatory Visit: Payer: MEDICAID

## 2024-02-14 LAB — SURGICAL PATHOLOGY SPECIMEN

## 2024-02-14 NOTE — Home Health (Signed)
 aox4, vitals and assessment completed. lungs cta, bowel sounds x4.  Patient had debridement earlier this week with Dr Adeniyi.  Photo uploaded. Wound care completed per order utilzing 1 piece of black foam ot wound bed and 1 piece to bridge--vac settings at 125.  Sutures noted towards the heel.  Overall looks better than previous pictures.  Utilized aquaphor to dry patches on foot and heel.  Tolerated fairly well--some pain noted.   Encouraged increase in protein for wound healing. Discussed s/s of infection--encouraged good hand washing. Reviewed pressure relieving techniques and encouraged repositioning every 1-2 hours.   Reviewed Diabetes Management including fsbs, s/s of hyper and hypo glycemia and treatment of both.  Discussed prolonged effects of hyperglycemia.  Instructed on daily log and safe disposal of supplies. Encouraged ADA diet. fsbs 70-200.   Reviewed COPD management including avoiding irritants, breathing exercises, energy conservation, breathing treatments, pursed lip breathing.  Reviewed HTN management including blood pressure monitoring, diet: including low sodium, low caffeine, low fat.  Reviewed meds, fall precautions, how to contact [facility] and emergent care plan.  Discussed upcoming winter weather alert--encouraged to have supplies/alternative heat and review emergency plan.   next visit: vac  Discussed possible upcoming winter weather event and encouraged to make sure they have extra food, supplies/batteries and alternative heat source and charging portal for vac.  If not charging avaible for vac instructed to convert to w/d daily.

## 2024-02-15 ENCOUNTER — Encounter (INDEPENDENT_AMBULATORY_CARE_PROVIDER_SITE_OTHER): Payer: Self-pay | Admitting: Vascular & Interventional Radiology

## 2024-02-15 LAB — TISSUE CULTURE (AEROBIC CULT & GRAM STAIN)

## 2024-02-16 ENCOUNTER — Other Ambulatory Visit: Payer: Self-pay

## 2024-02-16 ENCOUNTER — Other Ambulatory Visit (HOSPITAL_COMMUNITY): Payer: Self-pay | Admitting: Internal Medicine

## 2024-02-16 MED ORDER — AMOXICILLIN 875 MG-POTASSIUM CLAVULANATE 125 MG TABLET: 1.0000 | ORAL_TABLET | Freq: Two times a day (BID) | ORAL | 0 refills | Status: AC

## 2024-02-16 NOTE — Home Health (Signed)
 Patient identified by two factors, alert and oriented x4, pleasant affect denies pain or discomfort, VS WNL, head to toe assessment completed, heart rate regular, lung sounds clear to auscultation, continent of bowel and bladder, bowel sounds positive in all four quads, voiding without difficulty, appetite good, no signs of edema observed, completed wound care as ordered, reviewed medications, educated pt on diabetes, COPD, wound care, s/s of infection, infection control, and to call office with any changes, questions, or concerns, pt voiced understanding. Patient remains homebound d/t Needs assistance of at least one other person to leave the home d/t need for use of offloading shoe, careplan for next visit is to complete wound care, assessment, and pt education.

## 2024-02-16 NOTE — Progress Notes (Signed)
 I spoke with home health via secure chat, Augmentin  sent to pharmacy based on recent OR cultures.
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
## 2024-02-19 ENCOUNTER — Other Ambulatory Visit: Payer: Self-pay

## 2024-02-19 NOTE — Home Health (Signed)
 Patient identified by two factors, alert and oriented x4, pleasant affect denies pain or discomfort, VS WNL, head to toe assessment completed, heart rate regular, lung sounds clear to auscultation, continent of bowel and bladder, bowel sounds positive in all four quads, voiding without difficulty, appetite good, no signs of edema observed, completed wound care as ordered, reviewed medications, educated pt on diabetes, COPD, wound care, s/s of infection, infection control, and to call office with any changes, questions, or concerns, pt voiced understanding. Patient remains homebound d/t Needs assistance of at least one other person to leave the home d/t need for use of offloading shoe, careplan for next visit is to complete wound care, assessment, and pt education.

## 2024-02-21 ENCOUNTER — Other Ambulatory Visit: Payer: Self-pay

## 2024-02-21 NOTE — Home Health (Signed)
 Patient identified by two factors, alert and oriented x4, pleasant affect denies pain or discomfort, VS WNL, head to toe assessment completed, heart rate regular, lung sounds clear to auscultation, continent of bowel and bladder, bowel sounds positive in all four quads, voiding without difficulty, appetite good, no signs of edema observed, completed wound care as ordered, reviewed medications, educated pt on diabetes, COPD, wound care, s/s of infection, infection control, and to call office with any changes, questions, or concerns, pt voiced understanding. Patient remains homebound d/t Needs assistance of at least one other person to leave the home d/t need for use of offloading shoe, careplan for next visit is to complete wound care, assessment, and pt education.

## 2024-02-22 ENCOUNTER — Ambulatory Visit (INDEPENDENT_AMBULATORY_CARE_PROVIDER_SITE_OTHER): Payer: Self-pay | Admitting: Primary Care

## 2024-02-22 ENCOUNTER — Ambulatory Visit: Payer: MEDICAID | Attending: Internal Medicine | Admitting: Internal Medicine

## 2024-02-22 ENCOUNTER — Telehealth (HOSPITAL_COMMUNITY): Payer: Self-pay | Admitting: Internal Medicine

## 2024-02-22 ENCOUNTER — Encounter (INDEPENDENT_AMBULATORY_CARE_PROVIDER_SITE_OTHER): Payer: Self-pay | Admitting: Vascular & Interventional Radiology

## 2024-02-22 ENCOUNTER — Other Ambulatory Visit: Payer: Self-pay

## 2024-02-22 ENCOUNTER — Encounter (HOSPITAL_BASED_OUTPATIENT_CLINIC_OR_DEPARTMENT_OTHER): Payer: Self-pay | Admitting: Internal Medicine

## 2024-02-22 VITALS — BP 148/92 | HR 94 | Temp 97.4°F | Ht 65.0 in | Wt 286.0 lb

## 2024-02-22 VITALS — BP 138/88 | HR 48 | Temp 98.8°F | Resp 18 | Ht 65.0 in | Wt 283.5 lb

## 2024-02-22 DIAGNOSIS — S91301D Unspecified open wound, right foot, subsequent encounter: Secondary | ICD-10-CM

## 2024-02-22 DIAGNOSIS — T8789 Other complications of amputation stump: Secondary | ICD-10-CM | POA: Insufficient documentation

## 2024-02-22 DIAGNOSIS — F419 Anxiety disorder, unspecified: Secondary | ICD-10-CM | POA: Insufficient documentation

## 2024-02-22 DIAGNOSIS — M869 Osteomyelitis, unspecified: Secondary | ICD-10-CM | POA: Insufficient documentation

## 2024-02-22 DIAGNOSIS — T8189XA Other complications of procedures, not elsewhere classified, initial encounter: Secondary | ICD-10-CM | POA: Insufficient documentation

## 2024-02-22 DIAGNOSIS — M86071 Acute hematogenous osteomyelitis, right ankle and foot: Secondary | ICD-10-CM | POA: Insufficient documentation

## 2024-02-22 DIAGNOSIS — M86171 Other acute osteomyelitis, right ankle and foot: Secondary | ICD-10-CM | POA: Insufficient documentation

## 2024-02-22 DIAGNOSIS — L089 Local infection of the skin and subcutaneous tissue, unspecified: Secondary | ICD-10-CM

## 2024-02-22 DIAGNOSIS — E1165 Type 2 diabetes mellitus with hyperglycemia: Secondary | ICD-10-CM | POA: Insufficient documentation

## 2024-02-22 MED ORDER — DAKIN'S SOLUTION 0.125 %: 1000.0000 mL | Freq: Every day | 0 refills | Status: AC

## 2024-02-22 NOTE — Progress Notes (Signed)
 [facility]  [address]  [address]  [place], [place] [address]  Phone: [phone]  Fax: [phone]  Name: Yvette Schmidt  DOB: April 19, 1975  MRN: [id number]    Date of Service: 02/22/2024 11:30 AM EST    Chief complaint:   Chief Complaint   Patient presents with    Follow Up     Diabetic Foot Infection, Right Foot         Subjective:     Yvette Schmidt is a 49 y.o. year old female coming in today to be evaluated for hospital follow-up.  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 Adeniyi on 12/05/2023.  She then had evidence of delayed surgical wound healing requiring additional surgical debridement without bone resection on 12/20/2023.  Initial and repeat OR cultures isolated Candida species.  Ultimately insurance approved posaconazole .  Patient did follow up with vascular surgery earlier this month, her appointment with me was canceled as he had a plan for repeat debridement.  She was taken back to the OR by Dr. Rod on 02/12/2024 underwent further wound debridement without bone resection and partial closure.  Cultures isolated coagulase-negative Staph along with Enterococcus faecalis.  She followed up with them today, planning holding wound VAC for 1 week utilizing Dakin solution then resume wound VAC next week with Santyl per her report.  She also reports significant issues with her anxiety and pain control and has asked him to clean contact her primary care.  She has started seeing a psychologist which has helped, prefers not to see a psychiatrist issue she has a [facility] previously.  She also is concerned she may be getting some bronchitis inquiring if she should take a prednisone  Dosepak.  She has not had any fever, chills rigors or sputum production.  Inhalers to help.  She has decreased smoking.    She reports compliance with  all antibiotics as prescribed.  She reports posaconazole  she is taking morning noon and bedtime as instructed by her pharmacist.      REVIEW OF SYSTEMS:     Remainder of review of systems negative unless indicated above.   PAST MEDICAL HISTORY:  Past Medical History:   Diagnosis Date    Asthma     Convulsions     COPD (chronic obstructive pulmonary disease)     Diabetes mellitus, type 2     Gastroparesis     GERD (gastroesophageal reflux disease)     Hypertension     Major depressive disorder     MRSA (methicillin resistant staph aureus) culture positive 10/10/2022    Right buttock wound    Type 2 diabetes mellitus     Wears glasses            Current Outpatient Medications   Medication Sig    ACCU-CHEK GUIDE GLUCOSE METER Does not apply Misc Use three times daily with lancets and test strips    ACCU-CHEK SOFTCLIX LANCETS Misc USE TO TEST BLOOD SUGAR THREE TIMES DAILY    albuterol  sulfate (PROVENTIL ) 2.5 mg /3 mL (0.083 %) Inhalation nebulizer solution Take 3 mL (2.5 mg total) by nebulization Three times a day as needed for Wheezing    albuterol  sulfate (VENTOLIN  HFA) 90 mcg/actuation Inhalation oral inhaler INHALE 1-2 PUFFS BY INHALATION EVERY 6 HOURS AS NEEDED    amitriptyline  (ELAVIL ) 50 mg Oral Tablet Take 1  Tablet (50 mg total) by mouth Every night    amoxicillin -pot clavulanate (AUGMENTIN ) 875-125 mg Oral Tablet Take 1 Tablet by mouth Twice daily for 14 days    aspirin  81 mg Oral Tablet, Chewable Chew 1 Tablet (81 mg total) Daily    azelastine  (ASTELIN ) 137 mcg (0.1 %) Nasal Spray, Non-Aerosol ADMINISTER 1 SPRAY INTO EACH NOSTRIL TWICE DAILY FOR 30 DAYS    betamethasone  valerate (VALISONE ) 0.1 % Ointment Apply topically Twice daily    Blood Sugar Diagnostic (ACCU-CHEK GUIDE TEST STRIPS) Does not apply Strip USE 1 STRIP TO CHECK BLOOD SUGAR FOUR TIMES DAILY BEFORE MEALS AND AT BEDTIME    budesonide -formoteroL  (SYMBICORT ) 160-4.5 mcg/actuation Inhalation oral inhaler Take 2 Puffs by inhalation Twice daily     cetirizine  (ZYRTEC ) 10 mg Oral Tablet Take 1 Tablet (10 mg total) by mouth Daily    clonazePAM  (KLONOPIN ) 0.5 mg Oral Tablet Take 1 Tablet (0.5 mg total) by mouth Twice daily for 30 days    doxycycline  hyclate (VIBRAMYCIN ) 100 mg Oral Capsule Take 1 Capsule (100 mg total) by mouth Twice daily for 10 days    EPINEPHrine  0.3 mg/0.3 mL Injection Auto-Injector Inject 0.3 mL (0.3 mg total) into the muscle Once, as needed for up to 2 doses    fluticasone  propionate (FLONASE ) 50 mcg/actuation Nasal Spray, Suspension Administer 2 Sprays into each nostril Twice daily    furosemide  (LASIX ) 40 mg Oral Tablet Take 1 Tablet (40 mg total) by mouth Daily    gabapentin  (NEURONTIN ) 800 mg Oral Tablet Take 1 Tablet (800 mg total) by mouth Three times a day Take 1/2 tablet by mouth in the morning and afternoon, then take 1 tablet in the evening.    insulin  aspart U-100 (NOVOLOG ) 100 unit/mL (3 mL) Subcutaneous Insulin  Pen Inject 20 Units under the skin Twice a day before meals (Patient taking differently: Inject under the skin Twice a day before meals Sliding scale)    insulin  glargine (LANTUS  SOLOSTAR U-100 INSULIN ) 100 unit/mL Subcutaneous Insulin  Pen Inject 20 Units under the skin Every night    Insulin  Needles, Disposable, (SURE COMFORT PEN NEEDLE) 32 gauge x 1/4 Needle USE TO INJECT INSULIN  TWICE DAILY    ipratropium (ATROVENT ) 0.02 % Inhalation Solution Take 1.25 mL (0.25 mg total) by nebulization Every 6 hours    linaCLOtide  (LINZESS ) 290 mcg Oral Capsule Take 1 Capsule (290 mcg total) by mouth Every morning    meloxicam  (MOBIC ) 15 mg Oral Tablet TAKE 1 TABLET (15 MG TOTAL) BY MOUTH EVERY NIGHT    mineral oil-hydrophil petrolat (AQUAPHOR) Ointment Apply topically Four times a day as needed    mupirocin  (BACTROBAN ) 2 % Ointment Apply topically Three times a day    NaCl-Zn Ac-Vit B6-Citric Acid (WOUND CLEANSER) Irrigation Spray, Non-Aerosol Irrigate with 1 Spray as directed Once per day as needed (with dressing changes)     Nebulizers Misc Use with Nebulizer medication    pantoprazole  (PROTONIX ) 40 mg Oral Tablet, Delayed Release (E.C.) TAKE 1 TABLET (40 MG TOTAL) BY MOUTH TWICE DAILY    posaconazole  (NOXAFIL ) 100 mg Oral Tablet, Delayed Release (E.C.) Take 3 Tablets (300 mg total) by mouth Daily for 180 days    rosuvastatin  (CRESTOR ) 20 mg Oral Tablet Take 1 Tablet (20 mg total) by mouth Every evening    sennosides-docusate sodium  (SENOKOT-S) 8.6-50 mg Oral Tablet Take 1 Tablet by mouth Twice daily for 30 days    SITagliptin  phosphate (JANUVIA ) 100 mg Oral Tablet Take 1 Tablet (100 mg total) by mouth  Once a day    sodium hypochlorite (DAKIN'S SOLUTION) 0.125 % Solution Irrigate with 1,000 mL as directed Daily Daily dressing change to right foot- wet to dry dressing with dakins solution    SUMAtriptan  (IMITREX ) 50 mg Oral Tablet TAKE 1 TABLET BY MOUTH ONCE DAILY AS NEEDED FOR MIGRAINES MAY REPEAT IN 2 HOURS IF NEEDED FOR 30 DAYS DO NOT EXCEED MORE THAN 9 DOSES IN A 30 DAY PERIOD       Objective:     BP 138/88 (Site: Left Arm, Patient Position: Sitting, Cuff Size: Adult)   Pulse (!) 48   Temp 37.1 C (98.8 F) (Oral)   Resp 18   Ht 1.651 m (5' 5)   Wt 129 kg (283 lb 8.2 oz)   SpO2 98%   BMI 47.18 kg/m      Body mass index is 47.18 kg/m.  General: Pleasant, awake, alert, no acute distress.  Good eye contact, not tearful.  Clear non tangential speech patterns.  HEENT:  Poor dentition, no thrush  CV: RRR without murmurs.   Respiratory: Lungs CTA bilateral, anterior and posterior lung fields without wheezing or crackles.     Right foot wrapped, wearing forefoot offloading boot.  Images obtained by vascular surgery later today reviewed, see below.                          Assessment/Plan   49 y.o.female initially admitted to [facility] 11/17-25/25 secondary to right foot gas gangrene with acute osteomyelitis requiring 5th ray amputation with drainage of abscess by Dr. Rod.  Margins were consistent with acute  osteomyelitis however nonviable, discharged with IV dalbavancin infusions received on 11/17 and 11/25.  Of note patient also did receive a dose of IV Dalvance  [facility] emergency department on 11/09.   Patient then readmitted secondary to nonhealing wound 11/25-12/3/25 underwent repeat right foot debridement by Dr. Rod, bone not resected.  Cultures isolated Candida albicans and krusei, previous cultures isolated Candida glabrata thought to be contaminant only.  Patient ultimately discharged with oral ciprofloxacin  and Flagyl , initially itraconazole  however ultimately changed to posaconazole  300 mg daily.  Patient did follow up with vascular surgery earlier this month, her appointment with me was canceled since the plan was to go back to the OR for debridement.  This was performed on 02/12/2024, bone not resected cultures isolating Enterococcus faecalis diphtheroids and coagulase-negative Staph.  I did call her in oral Augmentin  which she is taking.  She also reports completing doxycycline  and metronidazole .      1. Acute osteomyelitis of metatarsal bone of right foot (CMS HCC) (Primary)  2. Acute hematogenous osteomyelitis of right foot (CMS HCC)  3. Fungal osteomyelitis (CMS HCC)  4. Delayed surgical wound healing of foot amputation stump (CMS HCC)  (CMS HCC)    Overall wound is healing per images uploaded by vascular surgery today however some maceration in the proximal aspect. Per Sari, plans to hold the wound VAC for a week, use daily Dakin solution then resume negative pressure wound VAC with Santyl next week.  Agree with this plan of care along with foot offloading. Complete Augmentin  as prescribed.  She indicates pharmacy instructed her to take posaconazole  3 times per day and so that daily.  She was educated the correct dose is 3 tablets daily, I did verify the prescription is written as such.  My office will contact her pharmacy to clarify any additional instructions they have received and  ensure she has enough prescription history for a full 6 months.  Follow up lab work ordered.      5. Anxiety    Follow up closely with her PCP.  She is seeing a psychologist noting improvement.  We did discuss a referral to Psychiatry with medication as however she declined she had a bad experience in the past some center.  We will forward today's no PCP in to assist with medication adjustment as needed.    6. Poorly controlled diabetes mellitus (CMS HCC)    Importance of glycemic control discussed.  She also inquired if she should be taking a Dosepak secondary to she feels like she has bronchitis.  She has no signs of bronchitis on exam lungs are completely clear.  She is aware they typically child not to give her steroids as it makes her diabetes poorly controlled.  I agree with this as there is no indication for systemic steroids.  Patient also is actively taking Symbicort  which is an inhaled steroid.        The patient was given the opportunity to ask questions and review today's plan of care. All questions were answered today in clinic and they were in agreement with current plan of care.  They were advised to contact the office with any additional questions or concerns.  They were advised to seek immediate medical evaluation and contact the office for follow up if symptoms worsen.  On the day of the encounter, a total of  40 minutes was spent on this patient encounter including review of historical information, examination, documentation and post-visit activities.     Medication adherence counseling, review of possible adverse reactions, and drug interactions performed today in clinic.  Advised to contact the office or review any concerns or questions about medications with their pharmacist.     Follow up:  03/21/24 coordinating with her husband's follow up with vascular surgery since transportation is an issue.      Dorn Bars, DO  [facility] Infectious Diseases

## 2024-02-22 NOTE — Progress Notes (Signed)
 Right foot demonstrates healthy looking granulation tissue and flaps.  Plantar flap still little bit macerated likely from wound VAC.  Complain of pain in her toes but no evidence of infection at the base of the remaining toes.      Plan:  Hold wound VAC for 1 week and do Dakin's wound packing.      Will re-evaluate the wound in 1 week.      Patient needs to continue antibiotics.      The need for offloading and nonweightbearing was discussed with the patient       Follow up with ID

## 2024-02-22 NOTE — Progress Notes (Signed)
 [facility]  [address]  [place] [address]  Phone: [phone]  Fax: [phone]      Encounter Date: 02/22/2024    Patient ID:  Yvette Schmidt  FMW:[id number]    DOB: September 04, 1975  Age: 49 y.o. female    Subjective:     Chief Complaint   Patient presents with    Post Op     Room 9 ~ Post OP rt foot debridement        HPI  Yvette Schmidt is a 49 y.o. female who was seen today for post op follow up. Patient is status post right foot excisional debridement secondary to tissue necrosis of surgical wound. Wound bed is healing well. There is good granulation tissue noted. Flap is macerated. She is currently using wound vac. She is complaining of right foot toe pain, no evidence of skin breakdown. She is following with ID later today to discuss OR cultures. Is currently on posaconazole  and Augmentin . She is asking for a new offloading shoe. She is very concerned about her anxiety and depression as she feels her PCP is not managing it well.     Current Outpatient Medications   Medication Sig    ACCU-CHEK GUIDE GLUCOSE METER Does not apply Misc Use three times daily with lancets and test strips    ACCU-CHEK SOFTCLIX LANCETS Misc USE TO TEST BLOOD SUGAR THREE TIMES DAILY    albuterol  sulfate (PROVENTIL ) 2.5 mg /3 mL (0.083 %) Inhalation nebulizer solution Take 3 mL (2.5 mg total) by nebulization Three times a day as needed for Wheezing    albuterol  sulfate (VENTOLIN  HFA) 90 mcg/actuation Inhalation oral inhaler INHALE 1-2 PUFFS BY INHALATION EVERY 6 HOURS AS NEEDED    amitriptyline  (ELAVIL ) 50 mg Oral Tablet Take 1 Tablet (50 mg total) by mouth Every night    amoxicillin -pot clavulanate (AUGMENTIN ) 875-125 mg Oral Tablet Take 1 Tablet by mouth Twice daily for 14 days    aspirin  81 mg Oral Tablet, Chewable Chew 1 Tablet (81 mg total) Daily    azelastine  (ASTELIN ) 137 mcg (0.1 %) Nasal Spray, Non-Aerosol ADMINISTER 1 SPRAY INTO EACH NOSTRIL TWICE DAILY FOR 30 DAYS    betamethasone   valerate (VALISONE ) 0.1 % Ointment Apply topically Twice daily (Patient taking differently: Apply 1 Each topically Twice daily AREAS OF DRY SKIN  Morning and evening Indications: OTHER)    Blood Sugar Diagnostic (ACCU-CHEK GUIDE TEST STRIPS) Does not apply Strip USE 1 STRIP TO CHECK BLOOD SUGAR FOUR TIMES DAILY BEFORE MEALS AND AT BEDTIME    budesonide -formoteroL  (SYMBICORT ) 160-4.5 mcg/actuation Inhalation oral inhaler Take 2 Puffs by inhalation Twice daily    clonazePAM  (KLONOPIN ) 0.5 mg Oral Tablet Take 1 Tablet (0.5 mg total) by mouth Twice daily for 30 days    doxycycline  hyclate (VIBRAMYCIN ) 100 mg Oral Capsule Take 1 Capsule (100 mg total) by mouth Twice daily for 10 days    EPINEPHrine  0.3 mg/0.3 mL Injection Auto-Injector Inject 0.3 mL (0.3 mg total) into the muscle Once, as needed for up to 2 doses    fluticasone  propionate (FLONASE ) 50 mcg/actuation Nasal Spray, Suspension Administer 2 Sprays into each nostril Twice daily    furosemide  (LASIX ) 40 mg Oral Tablet Take 1 Tablet (40 mg total) by mouth Daily    gabapentin  (NEURONTIN ) 800 mg Oral Tablet Take 1 Tablet (800 mg total) by mouth Three times a day Take 1/2 tablet by mouth in the morning and afternoon, then take 1  tablet in the evening.    insulin  aspart U-100 (NOVOLOG ) 100 unit/mL (3 mL) Subcutaneous Insulin  Pen Inject 20 Units under the skin Twice a day before meals (Patient taking differently: Inject under the skin Twice a day before meals Sliding scale)    insulin  glargine (LANTUS  SOLOSTAR U-100 INSULIN ) 100 unit/mL Subcutaneous Insulin  Pen Inject 20 Units under the skin Every night    Insulin  Needles, Disposable, (SURE COMFORT PEN NEEDLE) 32 gauge x 1/4 Needle USE TO INJECT INSULIN  TWICE DAILY    ipratropium (ATROVENT ) 0.02 % Inhalation Solution Take 1.25 mL (0.25 mg total) by nebulization Every 6 hours    meloxicam  (MOBIC ) 15 mg Oral Tablet TAKE 1 TABLET (15 MG TOTAL) BY MOUTH EVERY NIGHT    mineral oil-hydrophil petrolat (AQUAPHOR) Ointment  Apply topically Four times a day as needed    NaCl-Zn Ac-Vit B6-Citric Acid (WOUND CLEANSER) Irrigation Spray, Non-Aerosol Irrigate with 1 Spray as directed Once per day as needed (with dressing changes)    Nebulizers Misc Use with Nebulizer medication    pantoprazole  (PROTONIX ) 40 mg Oral Tablet, Delayed Release (E.C.) TAKE 1 TABLET (40 MG TOTAL) BY MOUTH TWICE DAILY    posaconazole  (NOXAFIL ) 100 mg Oral Tablet, Delayed Release (E.C.) Take 3 Tablets (300 mg total) by mouth Daily for 180 days    rosuvastatin  (CRESTOR ) 20 mg Oral Tablet Take 1 Tablet (20 mg total) by mouth Every evening    SITagliptin  phosphate (JANUVIA ) 100 mg Oral Tablet Take 1 Tablet (100 mg total) by mouth Once a day    sodium hypochlorite (DAKIN'S SOLUTION) 0.125 % Solution Irrigate with 1,000 mL as directed Daily Daily dressing change to right foot- wet to dry dressing with dakins solution    SUMAtriptan  (IMITREX ) 50 mg Oral Tablet TAKE 1 TABLET BY MOUTH ONCE DAILY AS NEEDED FOR MIGRAINES MAY REPEAT IN 2 HOURS IF NEEDED FOR 30 DAYS DO NOT EXCEED MORE THAN 9 DOSES IN A 30 DAY PERIOD    vitamin B complex  Oral Tablet Take 1 Tablet by mouth Daily (Patient not taking: Reported on 02/08/2024)     Allergies[1]    Past Medical History:   Diagnosis Date    Asthma     Convulsions     COPD (chronic obstructive pulmonary disease)     Diabetes mellitus, type 2     Gastroparesis     GERD (gastroesophageal reflux disease)     Hypertension     Major depressive disorder     MRSA (methicillin resistant staph aureus) culture positive 10/10/2022    Right buttock wound    Type 2 diabetes mellitus     Wears glasses          Past Surgical History:   Procedure Laterality Date    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 ovary retained    HX LAP CHOLECYSTECTOMY  05/12/2017    PB COLONOSCOPY,DIAGNOSTIC           Family Medical History:    None         Social History[2]    Review of  Systems:     Constitutional  Negative for: Fever, Chills, Weight Loss, Malaise/Fatigue, Diaphoresis, Weakness  Skin Positive for : Rash  Skin Negative for: Mass, Itching, Lesion, Mole, Changing lesion, Growing lesion, Painful lesion, Lesion drainage, Ulcers  HENT Positive for: Headaches, Congestion, Stridor  HENT Negative for: Sore Throat, Nosebleeds, Ear  Discharge, Ear Pain, Tinnitus, Hearing Loss  Eyes Positive for: Blurred Vision  Eyes Negative for: Double Vision, Photophobia, Eye Pain, Eye Discharge, Eye Redness  Cardiovascular Positive for: Leg Swelling, PND  Cardiovascular Negative for: Chest Pain, Palpitations, Orthopnea, Claudication  Respiratory Positive for: Cough, Shortness of Breath, Wheezing  Respiratory Negative for: Sputum Production, Hemoptysis  Gastrointestinal Positive for: Constipation  Gastrointestinal Negative for: Blood in Stool, Melena, Dysphagia, Diarrhea, Abdominal Pain, Vomiting, Nausea, Heartburn     Genitourinary Negative for: Dysuria, Urgency, Frequency, Hematuria, Flank Pain  Musculoskeletal: Neck Pain, Back Pain, Joint Pain  Musculoskeletal Negative for: Falls, Myalgias  Endo/Heme/Allergy Positive for: Env Allergies, Polydipsia  Endo/Heme/Allergy Negative for: Easy Bruise/Bleed  Neurological Positive for: Tingling, Tremor  Neurological Negative for : Dizziness, Sensory Change, Speech Change, Focal Weakness, Seizures, LOC  Psychiatric Positive for: Depression, Nervous/Anxious  Psychological Negative for: Insomnia, Memory Loss, Hallucinations, Substance Abuse, Suicidal Ideas                          Objective:   Vitals: BP (!) 148/92 (Patient Position: Sitting)   Pulse 94   Temp 36.3 C (97.4 F)   Ht 1.651 m (5' 5)   Wt 130 kg (286 lb)   SpO2 91%   BMI 47.59 kg/m         General Exam:    General:  appears chronically ill  Lungs:  Breathing nonlabored  Cardiovascular:  regular rate and rhythm, S1, S2 normal, no murmur, click, rub or gallop  Extremities:  no edema   Neurologic: Grossly normal. Alert and  oriented x3  Vascular    pulses 2+ throughout      Ancillary Tests Reviewed:  None: None        ENCOUNTER DIAGNOSES     ICD-10-CM   1. Diabetic foot infection (CMS HCC)  (CMS HCC)  E11.628    L08.9   2. Open wound of right foot, subsequent encounter  S91.301D       Assessment     Right diabetic foot infection     History of Right foot exploration.  Further debridement excisional with removal of nonviable skin subcutaneous tissue fat fascia muscle tendon.     S/p Right foot excision of debridement skin subcutaneous tissue fat fascia muscle.    Washout and partial closure of wound      Plan     Will hold wound vac for one week  Daily dressing changes with dakins solution   New order given for offloading shoe  Follow up in one week       Orders Placed This Encounter    [place] [facility] WOUND C[facility] DRESSING (AMB ONLY)    sodium hypochlorite (DAKIN'S SOLUTION) 0.125 % Solution    DME - ORTHOTICS       Return in about 1 week (around 02/29/2024).      Autym Siess, APRN, CNP    This note was partially generated using MModal Fluency Direct system, and there may be some incorrect words, spellings, and punctuation that were not noted in checking the note before saving.    Dr. Rod has personally spoken with and examined the patient in the room as well as had a conversation about the treatment plan with Kashaun Bebo, APRN, CNP         [1]   Allergies  Allergen Reactions    Aspartame     Beeswax      Allergic to [employer]  Other      Nutrasweet, sugar subs    Splenda [Sucralose]      ALL SUGAR SUBSTITUTES      Sulfa (Sulfonamides)      HIVES    Theophylline      ANXIETY    Hydrocodone Itching   [2]   Social History  Tobacco Use    Smoking status: Every Day     Current packs/day: 0.50     Average packs/day: 0.5 packs/day for 15.0 years (7.5 ttl pk-yrs)     Types: Cigarettes    Smokeless tobacco: Never   Vaping Use    Vaping status: Never Used   Substance Use Topics    Alcohol use: Not Currently     Comment: seldom    Drug use: Not  Currently     Types: Marijuana     Comment: 2017

## 2024-02-22 NOTE — Telephone Encounter (Signed)
 Per patient she was instructed by her pharmacist to take posaconazole  morning noon and bedtime, correct instructions are 300 mg (3 tablets) daily.  The order in epic also is correct.  Please ensure that they have a prescription history to complete a full 6 months of posaconazole     Thanks  Dorn Bars, DO

## 2024-02-22 NOTE — Patient Instructions (Addendum)
 Change posaconazole  to 300 mg daily, not 3 times per day.     Dorn Bars, DO  [facility] Infectious Diseases  02/22/2024 12:29

## 2024-02-23 ENCOUNTER — Ambulatory Visit: Payer: MEDICAID | Attending: Internal Medicine | Admitting: Internal Medicine

## 2024-02-23 ENCOUNTER — Other Ambulatory Visit: Payer: Self-pay

## 2024-02-23 DIAGNOSIS — T8789 Other complications of amputation stump: Secondary | ICD-10-CM | POA: Insufficient documentation

## 2024-02-23 LAB — COMPREHENSIVE METABOLIC PANEL, NON-FASTING
ALBUMIN: 3.3 g/dL — ABNORMAL LOW (ref 3.5–5.0)
ALKALINE PHOSPHATASE: 143 U/L — ABNORMAL HIGH (ref 40–110)
ALT (SGPT): 9 U/L (ref <30)
ANION GAP: 13 mmol/L (ref 4–13)
AST (SGOT): 11 U/L (ref 11–34)
BILIRUBIN TOTAL: 0.3 mg/dL (ref 0.3–1.3)
BUN/CREA RATIO: 21 (ref 6–22)
BUN: 14 mg/dL (ref 8–25)
CALCIUM: 8.8 mg/dL (ref 8.5–10.2)
CHLORIDE: 101 mmol/L (ref 96–111)
CO2 TOTAL: 24 mmol/L (ref 22–32)
CREATININE: 0.67 mg/dL (ref 0.49–1.10)
ESTIMATED GFR: >60 mL/min/{1.73_m2} (ref >60)
GLUCOSE: 207 mg/dL — ABNORMAL HIGH (ref 65–125)
POTASSIUM: 4.4 mmol/L (ref 3.5–5.1)
PROTEIN TOTAL: 6.8 g/dL (ref 6.4–8.3)
SODIUM: 138 mmol/L (ref 136–145)

## 2024-02-23 LAB — CBC WITH DIFF
BASOPHIL #: <0.04 10*3/uL (ref <=0.20)
BASOPHIL %: 0.2 %
EOSINOPHIL #: 0.18 10*3/uL (ref <=0.50)
EOSINOPHIL %: 1.4 %
HCT: 53.2 % — ABNORMAL HIGH (ref 34.8–46.0)
HGB: 17.5 g/dL — ABNORMAL HIGH (ref 11.5–16.0)
IMMATURE GRANULOCYTE #: 0.04 10*3/uL (ref <0.10)
IMMATURE GRANULOCYTE %: 0.3 % (ref 0.0–1.0)
LYMPHOCYTE #: 3.1 10*3/uL (ref 1.00–4.80)
LYMPHOCYTE %: 24.6 %
MCH: 29.9 pg (ref 26.0–32.0)
MCHC: 32.9 g/dL (ref 31.0–35.5)
MCV: 90.8 fL (ref 78.0–100.0)
MONOCYTE #: 0.64 10*3/uL (ref 0.20–1.10)
MONOCYTE %: 5.1 %
MPV: 11.6 fL (ref 8.7–12.5)
NEUTROPHIL #: 8.6 10*3/uL — ABNORMAL HIGH (ref 1.50–7.70)
NEUTROPHIL %: 68.4 %
PLATELETS: 171 10*3/uL (ref 150–400)
RBC: 5.86 10*6/uL — ABNORMAL HIGH (ref 3.85–5.22)
RDW-CV: 16.1 % — ABNORMAL HIGH (ref 11.5–15.5)
WBC: 12.6 10*3/uL — ABNORMAL HIGH (ref 3.7–11.0)

## 2024-02-23 NOTE — Telephone Encounter (Signed)
 Dr. Taft,    I spoke to pharmacist, Norleen, with [facility], to clarify Posaconazole  Rx.  Norleen explained this patient does have difficulty most of the time understanding her prescriptions.  He further explained he does have to help the patient on many occasions but he does not recall telling the patient to take this medication 3 times daily versus 3 tablets once daily.      John states this medication comes in packets of 60 so the 1st fill was dispensed as 240 tablets for 80 days (4 pks of 60) as the insurance only allows 270 tablets per 90 days and the pharmacy cannot split a pack.  The patient has 300 tablets remaining on this Rx.  Which would break down to another 240 tablets for the next 80 days, then the remaining 60 tablets after that to complete a full 6 mths.    Thanks,  Naomie Ernst, MA  02/23/2024, 12:04

## 2024-02-23 NOTE — Home Health (Signed)
 Patient identified by two factors, alert and oriented x4, pleasant affect denies pain or discomfort, VS WNL, head to toe assessment completed, heart rate regular, lung sounds clear to auscultation, continent of bowel and bladder, bowel sounds positive in all four quads, voiding without difficulty, appetite good, no signs of edema observed, wet to dry dressing completed, obtained labs as ordered without difficulty, pt tolerated well, reviewed medications, educated pt on diabetes, COPD, wound care, s/s of infection, infection control, and to call office with any changes, questions, or concerns, pt voiced understanding. Patient remains homebound d/t Needs assistance of at least one other person to leave the home d/t need for use of offloading shoe, careplan for next visit is to complete wound care, assessment, and pt education.

## 2024-02-26 ENCOUNTER — Other Ambulatory Visit: Payer: Self-pay

## 2024-02-27 LAB — POSACONAZOLE, SERUM: POSACONAZOLE: 3.43 ug/mL

## 2024-02-28 ENCOUNTER — Other Ambulatory Visit: Payer: Self-pay

## 2024-02-28 ENCOUNTER — Ambulatory Visit (HOSPITAL_BASED_OUTPATIENT_CLINIC_OR_DEPARTMENT_OTHER): Payer: MEDICAID | Admitting: Gastroenterology

## 2024-02-28 ENCOUNTER — Ambulatory Visit (HOSPITAL_BASED_OUTPATIENT_CLINIC_OR_DEPARTMENT_OTHER): Payer: Self-pay | Admitting: Internal Medicine

## 2024-02-28 NOTE — Home Health (Signed)
 Patient identified by two factors, alert and oriented x4, pleasant affect denies pain or discomfort, VS WNL, head to toe assessment completed, heart rate regular, lung sounds clear to auscultation, continent of bowel and bladder, bowel sounds positive in all four quads, voiding without difficulty, appetite good, no signs of edema observed, completed wound care, pt tolerated well, reviewed medications, educated pt on diabetes, COPD, wound care, s/s of infection, infection control, and to call office with any changes, questions, or concerns, pt voiced understanding. Patient remains homebound d/t Needs assistance of at least one other person to leave the home d/t need for use of offloading shoe, careplan for next visit is to complete wound care, assessment, and pt education.

## 2024-02-29 ENCOUNTER — Other Ambulatory Visit: Payer: Self-pay

## 2024-02-29 ENCOUNTER — Ambulatory Visit (INDEPENDENT_AMBULATORY_CARE_PROVIDER_SITE_OTHER): Payer: Self-pay | Admitting: Primary Care

## 2024-02-29 ENCOUNTER — Encounter (INDEPENDENT_AMBULATORY_CARE_PROVIDER_SITE_OTHER): Payer: Self-pay | Admitting: Vascular & Interventional Radiology

## 2024-02-29 ENCOUNTER — Telehealth (INDEPENDENT_AMBULATORY_CARE_PROVIDER_SITE_OTHER): Payer: MEDICAID | Admitting: Internal Medicine

## 2024-02-29 VITALS — BP 142/94 | HR 105 | Temp 97.3°F | Ht 65.0 in | Wt 282.0 lb

## 2024-02-29 DIAGNOSIS — E11628 Type 2 diabetes mellitus with other skin complications: Secondary | ICD-10-CM

## 2024-02-29 DIAGNOSIS — S91301D Unspecified open wound, right foot, subsequent encounter: Secondary | ICD-10-CM

## 2024-02-29 DIAGNOSIS — R221 Localized swelling, mass and lump, neck: Secondary | ICD-10-CM

## 2024-02-29 LAB — AFB CULTURE WITH STAIN
AFB CULTURE: NO GROWTH
AFB SMEAR: NEGATIVE

## 2024-02-29 MED ORDER — HYDROCODONE 5 MG-ACETAMINOPHEN 325 MG TABLET: 1.0000 | ORAL_TABLET | Freq: Two times a day (BID) | ORAL | 0 refills | Status: AC

## 2024-02-29 NOTE — Procedures (Signed)
 [facility]  [address]  [place] [address]  Phone: [phone]  Fax: [phone]    Miscellaneous Report  (left neck)    Test Date: 02/29/24    Patient:  Yvette Schmidt  Date of Birth:  [birth year] MRN:  [id number]   Gender: female Age:  49 y.o.       [phone] - US  LIMITED, JOINT/OTHER NONVASCULAR EXTREMITY STRUCTURE, REAL TIME W/ IMAGE DOCUMENTATION (AMB ONLY)    Date/Time: 02/29/2024 11:32 PM    Performed by: Rod Norleen LABOR, MD  Authorized by: Kesler, Jordan, APRN, CNP    Time Out:     Immediately before the procedure, a time out was called:  Yes    Patient verified:  Yes    Procedure Verified:  Yes    Site Verified:  Yes    Provider Information:   Ref. Provider:  No ref. provider found  PCP:  Ripley Carmell Puller, MD    Indication:                  Exam Performed:  left neck      Test Data     Findings:  area of concerns shows small cystic appearing structure measuring 4.22 x 4.98 x 4.40mm with small echogenicity within the structure - no blood flow detected.      Comments:       Technical Quality: Clear.      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      Further evaluation with a CT scan or MRI should be considered      Results reviewed with patient same day as this procedure:  Yes.    Reading Date/Time: 02/29/24 23:31 Sonographer: Comer Alas, RVT       This test was done under my direct supervision.The review and interpretation of this ancillary tests were performed by me. I also have  reviewed  elements performed and documented by the support staff. The scribed portion of the progress note was scribed on my behalf and at my direction.  I have reviewed and attest to the accuracy of the note.    Ashaun Gaughan A Wilberto Console, MD, FACS

## 2024-02-29 NOTE — Progress Notes (Signed)
 [facility]  [address]  [place] [address]  Phone: [phone]  Fax: [phone]      Encounter Date: 02/29/2024    Patient ID:  PHILLIP SANDLER  FMW:[id number]    DOB: 08-06-1975  Age: 49 y.o. female    Subjective:     Chief Complaint   Patient presents with    Follow Up     Room 2= 1 week debride       HPI  ARNITRA SOKOLOSKI is a 49 y.o. female who was seen today for 1 week follow up. Patient is status post right foot excisional debridement secondary to tissue necrosis of surgical wound. Maceration has improved over the past week. Wound measures smaller, 10x1.5cm She continues to complain of right foot toe pain, no evidence of skin breakdown. She remains on posaconazole .   Today she is complaining of a knot to the base of her left neck.  Continues to be very unsatisfied with her pain medication regimen that his being managed by her PCP. Is also complaining of low back pain that radiates up her chest and into her throat.     Current Outpatient Medications   Medication Sig    ACCU-CHEK GUIDE GLUCOSE METER Does not apply Misc Use three times daily with lancets and test strips    ACCU-CHEK SOFTCLIX LANCETS Misc USE TO TEST BLOOD SUGAR THREE TIMES DAILY    albuterol  sulfate (PROVENTIL ) 2.5 mg /3 mL (0.083 %) Inhalation nebulizer solution Take 3 mL (2.5 mg total) by nebulization Three times a day as needed for Wheezing    albuterol  sulfate (VENTOLIN  HFA) 90 mcg/actuation Inhalation oral inhaler INHALE 1-2 PUFFS BY INHALATION EVERY 6 HOURS AS NEEDED    amitriptyline  (ELAVIL ) 50 mg Oral Tablet Take 1 Tablet (50 mg total) by mouth Every night    amoxicillin -pot clavulanate (AUGMENTIN ) 875-125 mg Oral Tablet Take 1 Tablet by mouth Twice daily for 14 days    aspirin  81 mg Oral Tablet, Chewable Chew 1 Tablet (81 mg total) Daily    azelastine  (ASTELIN ) 137 mcg (0.1 %) Nasal Spray, Non-Aerosol ADMINISTER 1 SPRAY INTO EACH NOSTRIL TWICE DAILY FOR 30 DAYS    betamethasone  valerate  (VALISONE ) 0.1 % Ointment Apply topically Twice daily    Blood Sugar Diagnostic (ACCU-CHEK GUIDE TEST STRIPS) Does not apply Strip USE 1 STRIP TO CHECK BLOOD SUGAR FOUR TIMES DAILY BEFORE MEALS AND AT BEDTIME    budesonide -formoteroL  (SYMBICORT ) 160-4.5 mcg/actuation Inhalation oral inhaler Take 2 Puffs by inhalation Twice daily    cetirizine  (ZYRTEC ) 10 mg Oral Tablet Take 1 Tablet (10 mg total) by mouth Daily    clonazePAM  (KLONOPIN ) 0.5 mg Oral Tablet Take 1 Tablet (0.5 mg total) by mouth Twice daily for 30 days    EPINEPHrine  0.3 mg/0.3 mL Injection Auto-Injector Inject 0.3 mL (0.3 mg total) into the muscle Once, as needed for up to 2 doses    fluticasone  propionate (FLONASE ) 50 mcg/actuation Nasal Spray, Suspension Administer 2 Sprays into each nostril Twice daily    furosemide  (LASIX ) 40 mg Oral Tablet Take 1 Tablet (40 mg total) by mouth Daily    gabapentin  (NEURONTIN ) 800 mg Oral Tablet Take 1 Tablet (800 mg total) by mouth Three times a day Take 1/2 tablet by mouth in the morning and afternoon, then take 1 tablet in the evening.    insulin  aspart U-100 (NOVOLOG ) 100 unit/mL (3 mL) Subcutaneous Insulin  Pen Inject 20 Units under the skin Twice a  day before meals (Patient taking differently: Inject under the skin Twice a day before meals Sliding scale)    insulin  glargine (LANTUS  SOLOSTAR U-100 INSULIN ) 100 unit/mL Subcutaneous Insulin  Pen Inject 20 Units under the skin Every night    Insulin  Needles, Disposable, (SURE COMFORT PEN NEEDLE) 32 gauge x 1/4 Needle USE TO INJECT INSULIN  TWICE DAILY    ipratropium (ATROVENT ) 0.02 % Inhalation Solution Take 1.25 mL (0.25 mg total) by nebulization Every 6 hours    meloxicam  (MOBIC ) 15 mg Oral Tablet TAKE 1 TABLET (15 MG TOTAL) BY MOUTH EVERY NIGHT    mineral oil-hydrophil petrolat (AQUAPHOR) Ointment Apply topically Four times a day as needed    mupirocin  (BACTROBAN ) 2 % Ointment Apply topically Three times a day    NaCl-Zn Ac-Vit B6-Citric Acid (WOUND CLEANSER)  Irrigation Spray, Non-Aerosol Irrigate with 1 Spray as directed Once per day as needed (with dressing changes)    Nebulizers Misc Use with Nebulizer medication    pantoprazole  (PROTONIX ) 40 mg Oral Tablet, Delayed Release (E.C.) TAKE 1 TABLET (40 MG TOTAL) BY MOUTH TWICE DAILY    posaconazole  (NOXAFIL ) 100 mg Oral Tablet, Delayed Release (E.C.) Take 3 Tablets (300 mg total) by mouth Daily for 180 days    rosuvastatin  (CRESTOR ) 20 mg Oral Tablet Take 1 Tablet (20 mg total) by mouth Every evening    SITagliptin  phosphate (JANUVIA ) 100 mg Oral Tablet Take 1 Tablet (100 mg total) by mouth Once a day    sodium hypochlorite (DAKIN'S SOLUTION) 0.125 % Solution Irrigate with 1,000 mL as directed Daily Daily dressing change to right foot- wet to dry dressing with dakins solution    SUMAtriptan  (IMITREX ) 50 mg Oral Tablet TAKE 1 TABLET BY MOUTH ONCE DAILY AS NEEDED FOR MIGRAINES MAY REPEAT IN 2 HOURS IF NEEDED FOR 30 DAYS DO NOT EXCEED MORE THAN 9 DOSES IN A 30 DAY PERIOD     Allergies[1]    Past Medical History:   Diagnosis Date    Asthma     Convulsions     COPD (chronic obstructive pulmonary disease)     Diabetes mellitus, type 2     Gastroparesis     GERD (gastroesophageal reflux disease)     Hypertension     Major depressive disorder     MRSA (methicillin resistant staph aureus) culture positive 10/10/2022    Right buttock wound    Type 2 diabetes mellitus     Wears glasses          Past Surgical History:   Procedure Laterality Date    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 ovary retained    HX LAP CHOLECYSTECTOMY  05/12/2017    PB COLONOSCOPY,DIAGNOSTIC           Family Medical History:    None         Social History[2]    Review of  Systems:  Constitutional Positive for: Weight Loss, Malaise/Fatigue  Constitutional Negative for: Fever, Chills, Diaphoresis, Weakness  Skin Positive for : Rash  Skin Negative for: Mass, Itching, Lesion, Mole, Changing lesion,  Growing lesion, Painful lesion, Lesion drainage, Ulcers  HENT Positive for: Congestion, Stridor  HENT Negative for: Headaches, Hearing Loss, Tinnitus, Ear Pain, Ear Discharge, Nosebleeds, Sore Throat  Eyes Positive for: Blurred Vision  Eyes Negative for: Double Vision, Photophobia, Eye Pain, Eye Discharge, Eye Redness     Cardiovascular Negative  for: Chest Pain, Palpitations, Orthopnea, Claudication, Leg Swelling, PND  Respiratory Positive for: Cough, Sputum Production, Shortness of Breath, Wheezing  Respiratory Negative for: Hemoptysis  Gastrointestinal Positive for: Constipation  Gastrointestinal Negative for: Heartburn, Nausea, Vomiting, Abdominal Pain, Diarrhea, Blood in Stool, Melena, Dysphagia  Genitourinary Positive for: Frequency, Flank Pain  Genitourinary Negative for: Dysuria, Urgency, Hematuria  Musculoskeletal: Neck Pain, Myalgias, Back Pain, Joint Pain  Musculoskeletal Negative for: Falls  Endo/Heme/Allergy Positive for: Easy Bruise/Bleed, Env Allergies, Polydipsia     Neurological Positive for: Tingling, Tremor  Neurological Negative for : Dizziness, Sensory Change, Speech Change, Focal Weakness, Seizures, LOC  Psychiatric Positive for: Depression, Memory Loss, Insomnia, Nervous/Anxious  Psychological Negative for: Hallucinations, Substance Abuse, Suicidal Ideas                          Objective:   Vitals: BP (!) 142/94 (Patient Position: Sitting)   Pulse (!) 105   Temp 36.3 C (97.3 F)   Ht 1.651 m (5' 5)   Wt 128 kg (282 lb)   SpO2 96%   BMI 46.93 kg/m         General Exam:    General:  appears chronically ill  Lungs:  Breathing nonlabored  Cardiovascular:  regular rate and rhythm, S1, S2 normal, no murmur, click, rub or gallop  Extremities:  no edema   Neurologic: Grossly normal. Alert and oriented x3  Vascular    pulses 2+ throughout            Ancillary Tests Reviewed:  None: None    Preliminary US  suggests probable left neck cyst     ENCOUNTER DIAGNOSES     ICD-10-CM   1. Diabetic foot  infection (CMS HCC)  (CMS HCC)  E11.628    L08.9   2. Lump in neck  R22.1   3. Open wound of right foot, subsequent encounter  S91.301D         Assessment     Right diabetic foot infection     History of Right foot exploration.  Further debridement excisional with removal of nonviable skin subcutaneous tissue fat fascia muscle tendon.     S/p Right foot excision of debridement skin subcutaneous tissue fat fascia muscle.    Washout and partial closure of wound    Left neck cyst     Plan     Will hold wound vac for one week, leave sutures in for one more week  Daily dressing changes with dakins solution   New order given for offloading shoe  Follow up in one week   Continue home health  Patient to follow with PCP for pain medication needs and other complaints    Orders Placed This Encounter    23117 - US  LIMITED, JOINT/OTHER NONVASCULAR EXTREMITY STRUCTURE, REAL TIME W/ IMAGE DOCUMENTATION (AMB ONLY)       Return in about 1 week (around 03/07/2024).      Liberti Appleton, APRN, CNP    This note was partially generated using MModal Fluency Direct system, and there may be some incorrect words, spellings, and punctuation that were not noted in checking the note before saving.    Patient was seen independently in clinic. The cosigning physician, Dr. Adeniyi, was available for consultation.         [1]   Allergies  Allergen Reactions    Aspartame     Beeswax      Allergic to [employer]    Other  Nutrasweet, sugar subs    Splenda [Sucralose]      ALL SUGAR SUBSTITUTES      Sulfa (Sulfonamides)      HIVES    Theophylline      ANXIETY    Hydrocodone  Itching   [2]   Social History  Tobacco Use    Smoking status: Every Day     Current packs/day: 2.00     Average packs/day: 2.0 packs/day for 15.0 years (30.0 ttl pk-yrs)     Types: Cigarettes    Smokeless tobacco: Never   Vaping Use    Vaping status: Some Days    Substances: Nicotine , Flavoring   Substance Use Topics    Alcohol use: Not Currently     Comment: seldom    Drug use:  Not Currently     Types: Marijuana     Comment: 2017

## 2024-03-01 ENCOUNTER — Other Ambulatory Visit: Payer: Self-pay

## 2024-03-04 ENCOUNTER — Other Ambulatory Visit: Payer: Self-pay

## 2024-03-06 ENCOUNTER — Other Ambulatory Visit: Payer: Self-pay

## 2024-03-07 ENCOUNTER — Encounter (INDEPENDENT_AMBULATORY_CARE_PROVIDER_SITE_OTHER): Payer: Self-pay | Admitting: Vascular & Interventional Radiology

## 2024-03-08 ENCOUNTER — Other Ambulatory Visit: Payer: Self-pay

## 2024-03-21 ENCOUNTER — Ambulatory Visit (HOSPITAL_BASED_OUTPATIENT_CLINIC_OR_DEPARTMENT_OTHER): Payer: Self-pay | Admitting: Internal Medicine
